# Patient Record
Sex: Female | Born: 1945 | Race: White | Hispanic: No | Marital: Married | Smoking: Never smoker
Health system: Southern US, Community
[De-identification: ages and names within clinical notes are randomized; demographics above are authoritative.]

## PROBLEM LIST (undated history)

## (undated) DIAGNOSIS — E871 Hypo-osmolality and hyponatremia: Secondary | ICD-10-CM

## (undated) DIAGNOSIS — G43719 Chronic migraine without aura, intractable, without status migrainosus: Secondary | ICD-10-CM

## (undated) DIAGNOSIS — J189 Pneumonia, unspecified organism: Secondary | ICD-10-CM

## (undated) DIAGNOSIS — H903 Sensorineural hearing loss, bilateral: Secondary | ICD-10-CM

## (undated) DIAGNOSIS — H8103 Meniere's disease, bilateral: Secondary | ICD-10-CM

## (undated) DIAGNOSIS — R42 Dizziness and giddiness: Secondary | ICD-10-CM

## (undated) DIAGNOSIS — J40 Bronchitis, not specified as acute or chronic: Secondary | ICD-10-CM

## (undated) DIAGNOSIS — M8580 Other specified disorders of bone density and structure, unspecified site: Secondary | ICD-10-CM

## (undated) DIAGNOSIS — G7100 Muscular dystrophy, unspecified: Secondary | ICD-10-CM

## (undated) DIAGNOSIS — K5790 Diverticulosis of intestine, part unspecified, without perforation or abscess without bleeding: Secondary | ICD-10-CM

## (undated) DIAGNOSIS — E46 Unspecified protein-calorie malnutrition: Secondary | ICD-10-CM

## (undated) DIAGNOSIS — C801 Malignant (primary) neoplasm, unspecified: Secondary | ICD-10-CM

## (undated) DIAGNOSIS — G629 Polyneuropathy, unspecified: Secondary | ICD-10-CM

## (undated) HISTORY — DX: Unspecified protein-calorie malnutrition: E46

## (undated) HISTORY — DX: Bronchitis, not specified as acute or chronic: J40

## (undated) HISTORY — DX: Chronic migraine without aura, intractable, without status migrainosus: G43.719

## (undated) HISTORY — DX: Malignant (primary) neoplasm, unspecified: C80.1

## (undated) HISTORY — PX: ABDOMINAL HYSTERECTOMY: SHX81

## (undated) HISTORY — DX: Diverticulosis of intestine, part unspecified, without perforation or abscess without bleeding: K57.90

## (undated) HISTORY — DX: Other specified disorders of bone density and structure, unspecified site: M85.80

## (undated) HISTORY — PX: TONSILLECTOMY: SUR1361

## (undated) HISTORY — DX: Sensorineural hearing loss, bilateral: H90.3

## (undated) HISTORY — DX: Meniere's disease, bilateral: H81.03

## (undated) HISTORY — PX: TUBAL LIGATION: SHX77

## (undated) HISTORY — DX: Pneumonia, unspecified organism: J18.9

## (undated) HISTORY — DX: Dizziness and giddiness: R42

## (undated) HISTORY — PX: OTHER SURGICAL HISTORY: SHX169

## (undated) HISTORY — DX: Polyneuropathy, unspecified: G62.9

## (undated) HISTORY — DX: Hypo-osmolality and hyponatremia: E87.1

---
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
## 1968-03-06 HISTORY — PX: OTHER SURGICAL HISTORY: SHX169

## 1977-03-06 HISTORY — PX: BREAST ENHANCEMENT SURGERY: SHX7

## 1998-10-07 ENCOUNTER — Ambulatory Visit (HOSPITAL_BASED_OUTPATIENT_CLINIC_OR_DEPARTMENT_OTHER): Admission: RE | Admit: 1998-10-07 | Discharge: 1998-10-07 | Payer: Self-pay | Admitting: Plastic Surgery

## 1999-01-04 ENCOUNTER — Encounter: Admission: RE | Admit: 1999-01-04 | Discharge: 1999-01-04 | Payer: Self-pay | Admitting: *Deleted

## 1999-01-04 ENCOUNTER — Encounter: Payer: Self-pay | Admitting: *Deleted

## 2000-01-07 ENCOUNTER — Ambulatory Visit (HOSPITAL_COMMUNITY): Admission: RE | Admit: 2000-01-07 | Discharge: 2000-01-07 | Payer: Self-pay | Admitting: *Deleted

## 2000-01-07 ENCOUNTER — Encounter: Payer: Self-pay | Admitting: *Deleted

## 2000-02-11 ENCOUNTER — Encounter: Payer: Self-pay | Admitting: Neurology

## 2000-02-11 ENCOUNTER — Ambulatory Visit (HOSPITAL_COMMUNITY): Admission: RE | Admit: 2000-02-11 | Discharge: 2000-02-11 | Payer: Self-pay | Admitting: Neurology

## 2000-08-17 ENCOUNTER — Emergency Department (HOSPITAL_COMMUNITY): Admission: EM | Admit: 2000-08-17 | Discharge: 2000-08-17 | Payer: Self-pay | Admitting: Emergency Medicine

## 2000-08-17 ENCOUNTER — Encounter: Payer: Self-pay | Admitting: Emergency Medicine

## 2000-10-23 ENCOUNTER — Encounter: Payer: Self-pay | Admitting: Family Medicine

## 2000-10-23 ENCOUNTER — Ambulatory Visit (HOSPITAL_COMMUNITY): Admission: RE | Admit: 2000-10-23 | Discharge: 2000-10-23 | Payer: Self-pay | Admitting: Family Medicine

## 2000-12-05 ENCOUNTER — Encounter (INDEPENDENT_AMBULATORY_CARE_PROVIDER_SITE_OTHER): Payer: Self-pay | Admitting: Specialist

## 2000-12-05 ENCOUNTER — Ambulatory Visit (HOSPITAL_COMMUNITY): Admission: RE | Admit: 2000-12-05 | Discharge: 2000-12-05 | Payer: Self-pay | Admitting: Gastroenterology

## 2001-01-09 ENCOUNTER — Ambulatory Visit (HOSPITAL_COMMUNITY): Admission: RE | Admit: 2001-01-09 | Discharge: 2001-01-09 | Payer: Self-pay | Admitting: Gastroenterology

## 2002-05-20 ENCOUNTER — Encounter: Payer: Self-pay | Admitting: Internal Medicine

## 2002-05-20 ENCOUNTER — Encounter: Admission: RE | Admit: 2002-05-20 | Discharge: 2002-05-20 | Payer: Self-pay | Admitting: Internal Medicine

## 2002-05-22 ENCOUNTER — Other Ambulatory Visit: Admission: RE | Admit: 2002-05-22 | Discharge: 2002-05-22 | Payer: Self-pay | Admitting: Obstetrics and Gynecology

## 2002-10-28 ENCOUNTER — Ambulatory Visit: Admission: RE | Admit: 2002-10-28 | Discharge: 2002-10-28 | Payer: Self-pay | Admitting: Neurology

## 2002-10-28 ENCOUNTER — Encounter: Payer: Self-pay | Admitting: Neurology

## 2003-01-27 ENCOUNTER — Encounter: Admission: RE | Admit: 2003-01-27 | Discharge: 2003-01-27 | Payer: Self-pay | Admitting: Neurology

## 2003-02-12 ENCOUNTER — Encounter: Admission: RE | Admit: 2003-02-12 | Discharge: 2003-02-12 | Payer: Self-pay | Admitting: Neurology

## 2003-07-22 ENCOUNTER — Encounter: Admission: RE | Admit: 2003-07-22 | Discharge: 2003-07-22 | Payer: Self-pay | Admitting: Internal Medicine

## 2003-07-23 ENCOUNTER — Other Ambulatory Visit: Admission: RE | Admit: 2003-07-23 | Discharge: 2003-07-23 | Payer: Self-pay | Admitting: Obstetrics and Gynecology

## 2004-07-26 ENCOUNTER — Other Ambulatory Visit: Admission: RE | Admit: 2004-07-26 | Discharge: 2004-07-26 | Payer: Self-pay | Admitting: Obstetrics and Gynecology

## 2004-10-05 ENCOUNTER — Ambulatory Visit: Payer: Self-pay | Admitting: Internal Medicine

## 2005-10-12 ENCOUNTER — Ambulatory Visit: Payer: Self-pay | Admitting: Internal Medicine

## 2005-10-13 ENCOUNTER — Encounter: Admission: RE | Admit: 2005-10-13 | Discharge: 2005-10-13 | Payer: Self-pay | Admitting: Internal Medicine

## 2006-01-29 ENCOUNTER — Ambulatory Visit: Payer: Self-pay | Admitting: Internal Medicine

## 2006-01-29 LAB — CONVERTED CEMR LAB
ALT: 21 units/L (ref 0–40)
AST: 27 units/L (ref 0–37)
Chol/HDL Ratio, serum: 2.4
Cholesterol: 253 mg/dL (ref 0–200)
Free T4: 0.8 ng/dL — ABNORMAL LOW (ref 0.9–1.8)
HDL: 104.3 mg/dL (ref >39.0)
LDL DIRECT: 123.4 mg/dL
T3, Free: 3.1 pg/mL (ref 2.3–4.2)
TSH: 2.65 microintl units/mL (ref 0.35–5.50)
Triglyceride fasting, serum: 46 mg/dL (ref 0–149)
VLDL: 9 mg/dL (ref 0–40)

## 2006-01-30 ENCOUNTER — Encounter: Admission: RE | Admit: 2006-01-30 | Discharge: 2006-01-30 | Payer: Self-pay | Admitting: Internal Medicine

## 2006-10-16 ENCOUNTER — Encounter: Payer: Self-pay | Admitting: Internal Medicine

## 2006-10-23 ENCOUNTER — Ambulatory Visit: Payer: Self-pay | Admitting: Internal Medicine

## 2006-10-23 ENCOUNTER — Encounter (INDEPENDENT_AMBULATORY_CARE_PROVIDER_SITE_OTHER): Payer: Self-pay | Admitting: *Deleted

## 2006-10-23 DIAGNOSIS — IMO0001 Reserved for inherently not codable concepts without codable children: Secondary | ICD-10-CM | POA: Insufficient documentation

## 2006-10-23 DIAGNOSIS — E042 Nontoxic multinodular goiter: Secondary | ICD-10-CM | POA: Insufficient documentation

## 2006-10-23 DIAGNOSIS — E785 Hyperlipidemia, unspecified: Secondary | ICD-10-CM | POA: Insufficient documentation

## 2006-10-23 LAB — CONVERTED CEMR LAB
Bilirubin Urine: NEGATIVE
Blood in Urine, dipstick: NEGATIVE
Glucose, Urine, Semiquant: NEGATIVE
Ketones, urine, test strip: NEGATIVE
Nitrite: NEGATIVE
Protein, U semiquant: NEGATIVE
Specific Gravity, Urine: <1.005
Urobilinogen, UA: NEGATIVE
WBC Urine, dipstick: NEGATIVE
pH: 6.5

## 2006-10-24 ENCOUNTER — Encounter (INDEPENDENT_AMBULATORY_CARE_PROVIDER_SITE_OTHER): Payer: Self-pay | Admitting: *Deleted

## 2006-10-25 ENCOUNTER — Encounter: Admission: RE | Admit: 2006-10-25 | Discharge: 2006-10-25 | Payer: Self-pay | Admitting: Internal Medicine

## 2006-10-26 ENCOUNTER — Telehealth (INDEPENDENT_AMBULATORY_CARE_PROVIDER_SITE_OTHER): Payer: Self-pay | Admitting: *Deleted

## 2006-10-29 ENCOUNTER — Ambulatory Visit: Payer: Self-pay | Admitting: Internal Medicine

## 2006-10-29 ENCOUNTER — Encounter (INDEPENDENT_AMBULATORY_CARE_PROVIDER_SITE_OTHER): Payer: Self-pay | Admitting: *Deleted

## 2006-11-01 ENCOUNTER — Encounter (INDEPENDENT_AMBULATORY_CARE_PROVIDER_SITE_OTHER): Payer: Self-pay | Admitting: *Deleted

## 2007-03-05 ENCOUNTER — Ambulatory Visit (HOSPITAL_COMMUNITY): Admission: RE | Admit: 2007-03-05 | Discharge: 2007-03-06 | Payer: Self-pay | Admitting: Obstetrics and Gynecology

## 2007-03-07 DIAGNOSIS — C801 Malignant (primary) neoplasm, unspecified: Secondary | ICD-10-CM

## 2007-03-07 HISTORY — DX: Malignant (primary) neoplasm, unspecified: C80.1

## 2007-06-11 ENCOUNTER — Telehealth (INDEPENDENT_AMBULATORY_CARE_PROVIDER_SITE_OTHER): Payer: Self-pay | Admitting: *Deleted

## 2007-06-12 ENCOUNTER — Telehealth (INDEPENDENT_AMBULATORY_CARE_PROVIDER_SITE_OTHER): Payer: Self-pay | Admitting: *Deleted

## 2007-06-17 ENCOUNTER — Encounter: Payer: Self-pay | Admitting: Internal Medicine

## 2007-06-24 ENCOUNTER — Encounter: Payer: Self-pay | Admitting: Internal Medicine

## 2007-07-01 ENCOUNTER — Telehealth (INDEPENDENT_AMBULATORY_CARE_PROVIDER_SITE_OTHER): Payer: Self-pay | Admitting: *Deleted

## 2007-07-02 ENCOUNTER — Ambulatory Visit: Payer: Self-pay | Admitting: Internal Medicine

## 2007-11-07 ENCOUNTER — Telehealth (INDEPENDENT_AMBULATORY_CARE_PROVIDER_SITE_OTHER): Payer: Self-pay | Admitting: *Deleted

## 2008-01-27 ENCOUNTER — Ambulatory Visit: Payer: Self-pay | Admitting: Internal Medicine

## 2008-01-27 DIAGNOSIS — Z85828 Personal history of other malignant neoplasm of skin: Secondary | ICD-10-CM | POA: Insufficient documentation

## 2008-01-27 DIAGNOSIS — G7109 Other specified muscular dystrophies: Secondary | ICD-10-CM | POA: Insufficient documentation

## 2008-01-27 DIAGNOSIS — M949 Disorder of cartilage, unspecified: Secondary | ICD-10-CM

## 2008-01-27 DIAGNOSIS — M899 Disorder of bone, unspecified: Secondary | ICD-10-CM | POA: Insufficient documentation

## 2008-01-29 DIAGNOSIS — G609 Hereditary and idiopathic neuropathy, unspecified: Secondary | ICD-10-CM | POA: Insufficient documentation

## 2008-01-29 DIAGNOSIS — R519 Headache, unspecified: Secondary | ICD-10-CM | POA: Insufficient documentation

## 2008-01-29 DIAGNOSIS — R51 Headache: Secondary | ICD-10-CM | POA: Insufficient documentation

## 2008-01-31 ENCOUNTER — Encounter (INDEPENDENT_AMBULATORY_CARE_PROVIDER_SITE_OTHER): Payer: Self-pay | Admitting: *Deleted

## 2008-02-03 ENCOUNTER — Encounter (INDEPENDENT_AMBULATORY_CARE_PROVIDER_SITE_OTHER): Payer: Self-pay | Admitting: *Deleted

## 2008-02-03 ENCOUNTER — Ambulatory Visit: Payer: Self-pay | Admitting: Internal Medicine

## 2008-02-03 LAB — CONVERTED CEMR LAB
OCCULT 1: NEGATIVE
OCCULT 2: NEGATIVE
OCCULT 3: NEGATIVE

## 2008-02-26 ENCOUNTER — Telehealth (INDEPENDENT_AMBULATORY_CARE_PROVIDER_SITE_OTHER): Payer: Self-pay | Admitting: *Deleted

## 2008-03-06 DIAGNOSIS — K5790 Diverticulosis of intestine, part unspecified, without perforation or abscess without bleeding: Secondary | ICD-10-CM

## 2008-03-06 HISTORY — DX: Diverticulosis of intestine, part unspecified, without perforation or abscess without bleeding: K57.90

## 2008-04-02 ENCOUNTER — Telehealth (INDEPENDENT_AMBULATORY_CARE_PROVIDER_SITE_OTHER): Payer: Self-pay | Admitting: *Deleted

## 2008-04-29 ENCOUNTER — Encounter: Payer: Self-pay | Admitting: Internal Medicine

## 2008-07-07 ENCOUNTER — Encounter: Payer: Self-pay | Admitting: Internal Medicine

## 2008-10-27 ENCOUNTER — Encounter: Payer: Self-pay | Admitting: Internal Medicine

## 2008-10-28 ENCOUNTER — Encounter: Payer: Self-pay | Admitting: Internal Medicine

## 2008-11-25 ENCOUNTER — Encounter: Payer: Self-pay | Admitting: Internal Medicine

## 2008-12-30 ENCOUNTER — Telehealth (INDEPENDENT_AMBULATORY_CARE_PROVIDER_SITE_OTHER): Payer: Self-pay | Admitting: *Deleted

## 2009-01-25 ENCOUNTER — Encounter: Payer: Self-pay | Admitting: Internal Medicine

## 2009-02-23 ENCOUNTER — Ambulatory Visit: Payer: Self-pay | Admitting: Internal Medicine

## 2009-02-23 DIAGNOSIS — R002 Palpitations: Secondary | ICD-10-CM | POA: Insufficient documentation

## 2009-02-24 ENCOUNTER — Encounter: Payer: Self-pay | Admitting: Internal Medicine

## 2009-02-24 ENCOUNTER — Telehealth (INDEPENDENT_AMBULATORY_CARE_PROVIDER_SITE_OTHER): Payer: Self-pay | Admitting: *Deleted

## 2009-03-01 ENCOUNTER — Encounter (INDEPENDENT_AMBULATORY_CARE_PROVIDER_SITE_OTHER): Payer: Self-pay | Admitting: *Deleted

## 2009-03-04 ENCOUNTER — Telehealth: Payer: Self-pay | Admitting: Internal Medicine

## 2009-03-25 ENCOUNTER — Encounter: Payer: Self-pay | Admitting: Cardiology

## 2009-03-25 ENCOUNTER — Encounter: Payer: Self-pay | Admitting: Internal Medicine

## 2009-05-12 ENCOUNTER — Encounter: Payer: Self-pay | Admitting: Internal Medicine

## 2009-09-20 ENCOUNTER — Encounter: Payer: Self-pay | Admitting: Internal Medicine

## 2009-10-29 ENCOUNTER — Encounter: Payer: Self-pay | Admitting: Internal Medicine

## 2009-11-04 ENCOUNTER — Encounter: Payer: Self-pay | Admitting: Internal Medicine

## 2009-11-11 ENCOUNTER — Ambulatory Visit: Payer: Self-pay | Admitting: Internal Medicine

## 2009-11-11 DIAGNOSIS — R1084 Generalized abdominal pain: Secondary | ICD-10-CM | POA: Insufficient documentation

## 2009-11-11 DIAGNOSIS — K5900 Constipation, unspecified: Secondary | ICD-10-CM | POA: Insufficient documentation

## 2009-11-11 DIAGNOSIS — R209 Unspecified disturbances of skin sensation: Secondary | ICD-10-CM | POA: Insufficient documentation

## 2009-11-12 LAB — CONVERTED CEMR LAB
ALT: 19 units/L (ref 0–35)
AST: 35 units/L (ref 0–37)
Albumin: 4.6 g/dL (ref 3.5–5.2)
Alkaline Phosphatase: 60 units/L (ref 39–117)
BUN: 8 mg/dL (ref 6–23)
Basophils Absolute: 0 10*3/uL (ref 0.0–0.1)
Basophils Relative: 0.4 % (ref 0.0–3.0)
Bilirubin, Direct: 0.2 mg/dL (ref 0.0–0.3)
CO2: 29 meq/L (ref 19–32)
Calcium: 9.8 mg/dL (ref 8.4–10.5)
Chloride: 99 meq/L (ref 96–112)
Creatinine, Ser: 0.4 mg/dL (ref 0.4–1.2)
Eosinophils Absolute: 0.1 10*3/uL (ref 0.0–0.7)
Eosinophils Relative: 2.5 % (ref 0.0–5.0)
GFR calc non Af Amer: 152.67 mL/min (ref >60)
Glucose, Bld: 110 mg/dL — ABNORMAL HIGH (ref 70–99)
HCT: 41.3 % (ref 36.0–46.0)
Hemoglobin: 14 g/dL (ref 12.0–15.0)
Lymphocytes Relative: 21.8 % (ref 12.0–46.0)
Lymphs Abs: 1 10*3/uL (ref 0.7–4.0)
MCHC: 33.8 g/dL (ref 30.0–36.0)
MCV: 93.3 fL (ref 78.0–100.0)
Monocytes Absolute: 0.5 10*3/uL (ref 0.1–1.0)
Monocytes Relative: 10.7 % (ref 3.0–12.0)
Neutro Abs: 2.9 10*3/uL (ref 1.4–7.7)
Neutrophils Relative %: 64.6 % (ref 43.0–77.0)
Platelets: 379 10*3/uL (ref 150.0–400.0)
Potassium: 5 meq/L (ref 3.5–5.1)
RBC: 4.43 M/uL (ref 3.87–5.11)
RDW: 13.7 % (ref 11.5–14.6)
Sodium: 138 meq/L (ref 135–145)
TSH: 2.75 microintl units/mL (ref 0.35–5.50)
Total Bilirubin: 0.5 mg/dL (ref 0.3–1.2)
Total Protein: 7.2 g/dL (ref 6.0–8.3)
Vitamin B-12: 798 pg/mL (ref 211–911)
WBC: 4.4 10*3/uL — ABNORMAL LOW (ref 4.5–10.5)

## 2009-11-15 ENCOUNTER — Encounter: Admission: RE | Admit: 2009-11-15 | Discharge: 2009-11-15 | Payer: Self-pay | Admitting: Internal Medicine

## 2009-11-15 LAB — CONVERTED CEMR LAB: Hgb A1c MFr Bld: 5.8 % (ref 4.6–6.5)

## 2009-11-16 ENCOUNTER — Encounter: Payer: Self-pay | Admitting: Internal Medicine

## 2009-11-19 ENCOUNTER — Encounter: Payer: Self-pay | Admitting: Internal Medicine

## 2009-11-19 LAB — HM COLONOSCOPY

## 2010-02-04 ENCOUNTER — Encounter: Payer: Self-pay | Admitting: Internal Medicine

## 2010-02-09 ENCOUNTER — Telehealth: Payer: Self-pay | Admitting: Internal Medicine

## 2010-02-10 ENCOUNTER — Telehealth: Payer: Self-pay | Admitting: Internal Medicine

## 2010-02-16 ENCOUNTER — Telehealth (INDEPENDENT_AMBULATORY_CARE_PROVIDER_SITE_OTHER): Payer: Self-pay | Admitting: *Deleted

## 2010-03-03 ENCOUNTER — Encounter
Admission: RE | Admit: 2010-03-03 | Discharge: 2010-03-03 | Payer: Self-pay | Source: Home / Self Care | Attending: Neurology | Admitting: Neurology

## 2010-03-26 ENCOUNTER — Encounter: Payer: Self-pay | Admitting: Neurology

## 2010-04-03 LAB — CONVERTED CEMR LAB
ALT: 17 units/L (ref 0–35)
ALT: 18 units/L (ref 0–35)
ALT: 20 units/L (ref 0–35)
AST: 27 units/L (ref 0–37)
AST: 28 units/L (ref 0–37)
AST: 30 units/L (ref 0–37)
Albumin: 4.4 g/dL (ref 3.5–5.2)
Albumin: 4.5 g/dL (ref 3.5–5.2)
Albumin: 4.6 g/dL (ref 3.5–5.2)
Alkaline Phosphatase: 51 units/L (ref 39–117)
Alkaline Phosphatase: 56 units/L (ref 39–117)
Alkaline Phosphatase: 57 units/L (ref 39–117)
BUN: 10 mg/dL (ref 6–23)
BUN: 11 mg/dL (ref 6–23)
BUN: 6 mg/dL (ref 6–23)
Basophils Absolute: 0 10*3/uL (ref 0.0–0.1)
Basophils Absolute: 0 10*3/uL (ref 0.0–0.1)
Basophils Absolute: 0 10*3/uL (ref 0.0–0.1)
Basophils Relative: 0.3 % (ref 0.0–1.0)
Basophils Relative: 0.9 % (ref 0.0–3.0)
Basophils Relative: 0.9 % (ref 0.0–3.0)
Bilirubin, Direct: 0 mg/dL (ref 0.0–0.3)
Bilirubin, Direct: 0.1 mg/dL (ref 0.0–0.3)
Bilirubin, Direct: 0.1 mg/dL (ref 0.0–0.3)
CO2: 28 meq/L (ref 19–32)
CO2: 31 meq/L (ref 19–32)
CO2: 32 meq/L (ref 19–32)
Calcium: 10.1 mg/dL (ref 8.4–10.5)
Calcium: 9.4 mg/dL (ref 8.4–10.5)
Calcium: 9.5 mg/dL (ref 8.4–10.5)
Chloride: 100 meq/L (ref 96–112)
Chloride: 102 meq/L (ref 96–112)
Chloride: 103 meq/L (ref 96–112)
Cholesterol, target level: 200 mg/dL
Cholesterol: 219 mg/dL (ref 0–200)
Cholesterol: 231 mg/dL — ABNORMAL HIGH (ref 0–200)
Cholesterol: 232 mg/dL (ref 0–200)
Creatinine, Ser: 0.5 mg/dL (ref 0.4–1.2)
Creatinine, Ser: 0.6 mg/dL (ref 0.4–1.2)
Creatinine, Ser: 0.6 mg/dL (ref 0.4–1.2)
Direct LDL: 117.8 mg/dL
Direct LDL: 72.1 mg/dL
Direct LDL: 96.5 mg/dL
Eosinophils Absolute: 0.1 10*3/uL (ref 0.0–0.6)
Eosinophils Absolute: 0.1 10*3/uL (ref 0.0–0.7)
Eosinophils Absolute: 0.1 10*3/uL (ref 0.0–0.7)
Eosinophils Relative: 2.5 % (ref 0.0–5.0)
Eosinophils Relative: 2.6 % (ref 0.0–5.0)
Eosinophils Relative: 2.7 % (ref 0.0–5.0)
Free T4: 0.7 ng/dL (ref 0.6–1.6)
Free T4: 0.8 ng/dL (ref 0.6–1.6)
GFR calc Af Amer: 130 mL/min
GFR calc Af Amer: 161 mL/min
GFR calc non Af Amer: 106.98 mL/min (ref >60)
GFR calc non Af Amer: 108 mL/min
GFR calc non Af Amer: 133 mL/min
Glucose, Bld: 101 mg/dL — ABNORMAL HIGH (ref 70–99)
Glucose, Bld: 71 mg/dL (ref 70–99)
Glucose, Bld: 99 mg/dL (ref 70–99)
HCT: 41.8 % (ref 36.0–46.0)
HCT: 42.5 % (ref 36.0–46.0)
HCT: 43.4 % (ref 36.0–46.0)
HDL goal, serum: 40 mg/dL
HDL: 109.8 mg/dL (ref >39.00)
HDL: 128 mg/dL (ref >39.0)
HDL: 96.1 mg/dL (ref >39.0)
Hemoglobin: 14 g/dL (ref 12.0–15.0)
Hemoglobin: 14.4 g/dL (ref 12.0–15.0)
Hemoglobin: 14.6 g/dL (ref 12.0–15.0)
LDL Goal: 160 mg/dL
Lymphocytes Relative: 29.9 % (ref 12.0–46.0)
Lymphocytes Relative: 30.7 % (ref 12.0–46.0)
Lymphocytes Relative: 34 % (ref 12.0–46.0)
Lymphs Abs: 1.2 10*3/uL (ref 0.7–4.0)
MCHC: 32.8 g/dL (ref 30.0–36.0)
MCHC: 33.7 g/dL (ref 30.0–36.0)
MCHC: 34.5 g/dL (ref 30.0–36.0)
MCV: 92.5 fL (ref 78.0–100.0)
MCV: 93.2 fL (ref 78.0–100.0)
MCV: 96.8 fL (ref 78.0–100.0)
Monocytes Absolute: 0.3 10*3/uL (ref 0.1–1.0)
Monocytes Absolute: 0.3 10*3/uL (ref 0.2–0.7)
Monocytes Absolute: 0.4 10*3/uL (ref 0.1–1.0)
Monocytes Relative: 6.7 % (ref 3.0–11.0)
Monocytes Relative: 7.6 % (ref 3.0–12.0)
Monocytes Relative: 9.2 % (ref 3.0–12.0)
Neutro Abs: 2.1 10*3/uL (ref 1.4–7.7)
Neutro Abs: 2.4 10*3/uL (ref 1.4–7.7)
Neutro Abs: 2.5 10*3/uL (ref 1.4–7.7)
Neutrophils Relative %: 53.4 % (ref 43.0–77.0)
Neutrophils Relative %: 58.9 % (ref 43.0–77.0)
Neutrophils Relative %: 59.7 % (ref 43.0–77.0)
Platelets: 280 10*3/uL (ref 150–400)
Platelets: 303 10*3/uL (ref 150–400)
Platelets: 318 10*3/uL (ref 150.0–400.0)
Potassium: 3.6 meq/L (ref 3.5–5.1)
Potassium: 4 meq/L (ref 3.5–5.1)
Potassium: 5.3 meq/L — ABNORMAL HIGH (ref 3.5–5.1)
RBC: 4.39 M/uL (ref 3.87–5.11)
RBC: 4.49 M/uL (ref 3.87–5.11)
RBC: 4.69 M/uL (ref 3.87–5.11)
RDW: 12.6 % (ref 11.5–14.6)
RDW: 12.9 % (ref 11.5–14.6)
RDW: 13.5 % (ref 11.5–14.6)
Sodium: 138 meq/L (ref 135–145)
Sodium: 141 meq/L (ref 135–145)
Sodium: 142 meq/L (ref 135–145)
T3, Free: 3.2 pg/mL (ref 2.3–4.2)
TSH: 2.47 microintl units/mL (ref 0.35–5.50)
TSH: 2.69 microintl units/mL (ref 0.35–5.50)
TSH: 4.15 microintl units/mL (ref 0.35–5.50)
Total Bilirubin: 0.9 mg/dL (ref 0.3–1.2)
Total Bilirubin: 0.9 mg/dL (ref 0.3–1.2)
Total Bilirubin: 1 mg/dL (ref 0.3–1.2)
Total CHOL/HDL Ratio: 1.7
Total CHOL/HDL Ratio: 2
Total CHOL/HDL Ratio: 2.4
Total Protein: 7.7 g/dL (ref 6.0–8.3)
Total Protein: 7.8 g/dL (ref 6.0–8.3)
Total Protein: 7.8 g/dL (ref 6.0–8.3)
Triglycerides: 51 mg/dL (ref 0–149)
Triglycerides: 52 mg/dL (ref 0–149)
Triglycerides: 58 mg/dL (ref 0.0–149.0)
VLDL: 10 mg/dL (ref 0–40)
VLDL: 10 mg/dL (ref 0–40)
VLDL: 11.6 mg/dL (ref 0.0–40.0)
Vit D, 1,25-Dihydroxy: 26 — ABNORMAL LOW (ref 30–89)
Vit D, 25-Hydroxy: 63 ng/mL (ref 30–89)
WBC: 3.9 10*3/uL — ABNORMAL LOW (ref 4.5–10.5)
WBC: 4 10*3/uL — ABNORMAL LOW (ref 4.5–10.5)
WBC: 4.1 10*3/uL — ABNORMAL LOW (ref 4.5–10.5)

## 2010-04-05 NOTE — Consult Note (Signed)
Summary: [facility]  [facility]   Imported By: Lanelle Bal 09/30/2009 12:35:44  _____________________________________________________________________  External Attachment:    Type:   Image     Comment:   External Document

## 2010-04-05 NOTE — Letter (Signed)
[facility]  [facility]   Imported By: Lanelle Bal 11/22/2009 11:53:04  _____________________________________________________________________  External Attachment:    Type:   Image     Comment:   External Document

## 2010-04-05 NOTE — Letter (Signed)
Summary: [facility]   Imported By: Lanelle Bal 05/20/2009 11:06:56  _____________________________________________________________________  External Attachment:    Type:   Image     Comment:   External Document

## 2010-04-05 NOTE — Consult Note (Signed)
Summary: [facility]  [facility]   Imported By: Lanelle Bal 12/15/2009 14:04:59  _____________________________________________________________________  External Attachment:    Type:   Image     Comment:   External Document

## 2010-04-05 NOTE — Progress Notes (Signed)
Summary: Miacalcin refill--needs generic, not brand name  Phone Note Refill Request Message from:  Patient on February 10, 2010 10:57 AM  Refills Requested: Medication #1:  MIACALCIN 200 UNIT/ACT NASAL SOLN qhs patient just called back---wants [facility] to be generic, not brand name due to cost  Initial call taken by: Jerolyn Shin,  February 10, 2010 10:58 AM  Follow-up for Phone Call        This was not marked BMN so they will fill generic.  Patient aware. Follow-up by: Lucious Groves CMA,  February 10, 2010 11:17 AM

## 2010-04-05 NOTE — Procedures (Signed)
Summary: Colonoscopy/[facility]  Colonoscopy/[facility]   Imported By: Lanelle Bal 12/06/2009 09:08:21  _____________________________________________________________________  External Attachment:    Type:   Image     Comment:   External Document

## 2010-04-05 NOTE — Assessment & Plan Note (Signed)
Summary: "MAJOR BOWEL ISSUES"--[facility] [place]   Vital Signs:  Patient profile:   65 year old female Weight:      117.4 pounds Temp:     98.9 degrees F oral Pulse rate:   76 / minute Resp:     16 per minute BP sitting:   150 / 86  (left arm) Cuff size:   regular  Vitals Entered By: Shonna Chock CMA (November 11, 2009 10:45 AM) CC: Bowel Issues x 2 weeks, Last BM: Sunday, in order for patient to have a BM she has to Max out on OTC laxitives and stool softners. Patient was seen in the hospital on 10/29/09 , Constipation   CC:  Bowel Issues x 2 weeks, Last BM: Sunday, in order for patient to have a BM she has to Max out on OTC laxitives and stool softners. Patient was seen in the hospital on 10/29/09 , and Constipation.  History of Present Illness:      This is a 65 year old female who presents with Constipation since 08/24 despite  initiation of maximum doses of  stool softeners & laxatives every day since 09/01.  The patient reports small  hard stools ("rabbit pellets"),pressure like  abdominal pain,mild hemorrhoidal flare, and intermittent nausea.She  denies rectal pain, persistant  cramping, hematochezia, melena, vomiting, intermittent loose stools, rectal seepage, and tenesmus.  Associated symptoms include cold intolerance, this is chronic.  The patient denies the following symptoms: fever, back pain,significant  pelvic pain, and urinary retention.  The patient's medical history is notable for irritable bowel syndrome & MD( folowed by Dr Sandria Manly).  Partially effective treatments have included stool softeners and OTC laxatives.  Last colonoscopy 2002 was negative by Dr Madilyn Fireman.Records from [facility] & ER reviewed.  Current Medications (verified): 1)  Robaxin-750 750 Mg Tabs (Methocarbamol) .Marland Kitchen.. 1 By Mouth 3-4 X Daily 2)  Mucinex 400 Mg  Tb12 (Guaifenesin) .... As Needed 3)  Nortriptyline Hcl 10 Mg  Caps (Nortriptyline Hcl) .... Take Two Tabs Qd 4)  Miacalcin 200 Unit/act Nasal  Soln (Calcitonin (Salmon)) .... Qhs 5)  Fioricet 50-325-40 Mg  Tabs (Butalbital-Apap-Caffeine) .Marland Kitchen.. 1 By Mouth As Needed Pain 6)  Maxalt-Mlt 10 Mg  Tbdp (Rizatriptan Benzoate) .... As Needed Migraine 7)  Fexofenadine Hcl 180 Mg  Tabs (Fexofenadine Hcl) .... Take One Tablet Daily As Needed 8)  Neurontin 100 Mg Caps (Gabapentin) .... 3 By Mouth At Bedtime 9)  Pepcid Ac 10 Mg Tabs (Famotidine) .... As Needed  Allergies: 1)  ! * Actonel 2)  Cipro 3)  Sulfa 4)  Vioxx 5)  Fosamax 6)  * Aerobid  Review of Systems General:  Denies chills and sweats. Eyes:  Denies blurring, double vision, and vision loss-both eyes. ENT:  Denies difficulty swallowing and hoarseness. CV:  Occasional palpitations ; Dr Patty Sermons has assessed this. Derm:  Complains of hair loss; denies changes in nail beds and dryness. Neuro:  Complains of numbness and tingling; N&T from knees to feet & elbows to fingertips since late Spring. Some N&T in thighs of recent onset.  Physical Exam  General:  Thin but in no acute distress; alert,appropriate and cooperative throughout examination Eyes:  No corneal or conjunctival inflammation noted. EOMI. Perrla. No lid lag Neck:  No deformities, masses, or tenderness noted. Thyroid small Lungs:  Normal respiratory effort, chest expands symmetrically. Lungs are clear to auscultation, no crackles or wheezes. Heart:  normal rate, regular rhythm, no gallop, no rub, no JVD, no HJR, and  grade 1 /6 systolic murmur.   Abdomen:  Bowel sounds positive,abdomen soft but tender without masses, organomegaly or hernias noted.Diffuse bruits . Aorta not clinically enlarged Extremities:  No clubbing, cyanosis, edema.No onycholysis Neurologic:  alert & oriented X3, strength normal in all extremities, and DTRs symmetrical and normal.  No tremor   Impression & Recommendations:  Problem # 1:  CONSTIPATION (ICD-564.00)  Orders: Venipuncture (16109) TLB-TSH (Thyroid Stimulating Hormone)  (84443-TSH) Gastroenterology Referral (GI)  Problem # 2:  ABDOMINAL PAIN, GENERALIZED (ICD-789.07)  in context of #1  Orders: Venipuncture (60454) TLB-CBC Platelet - w/Differential (85025-CBCD) TLB-Hepatic/Liver Function Pnl (80076-HEPATIC) Gastroenterology Referral (GI) Radiology Referral (Radiology)  Problem # 3:  PARESTHESIA (ICD-782.0)  progressive of U & L extremities distally, recently in thighs  Orders: Venipuncture (09811) TLB-BMP (Basic Metabolic Panel-BMET) (80048-METABOL) TLB-TSH (Thyroid Stimulating Hormone) (84443-TSH) TLB-B12, Serum-Total ONLY (91478-G95) T-RPR (Syphilis) (62130-86578) Neurology Referral (Neuro)  Complete Medication List: 1)  Robaxin-750 750 Mg Tabs (Methocarbamol) .Marland Kitchen.. 1 by mouth 3-4 x daily 2)  Mucinex 400 Mg Tb12 (guaifenesin)  .... As needed 3)  Nortriptyline Hcl 10 Mg Caps (Nortriptyline hcl) .... Take two tabs qd 4)  Miacalcin 200 Unit/act Nasal Soln (Calcitonin (salmon)) .... Qhs 5)  Fioricet 50-325-40 Mg Tabs (Butalbital-apap-caffeine) .Marland Kitchen.. 1 by mouth as needed pain 6)  Maxalt-mlt 10 Mg Tbdp (Rizatriptan benzoate) .... As needed migraine 7)  Fexofenadine Hcl 180 Mg Tabs (Fexofenadine hcl) .... Take one tablet daily as needed 8)  Neurontin 100 Mg Caps (Gabapentin) .... 3 by mouth at bedtime 9)  Pepcid Ac 10 Mg Tabs (Famotidine) .... As needed 10)  Amlodipine Besylate 2.5 Mg Tabs (Amlodipine besylate) .Marland Kitchen.. 1 once daily if bp averages > 135/85  Patient Instructions: 1)  Miralax once daily X 3 days. 2)  Drink as much fluid as you can tolerate for the next few days. 3)  Check your Blood Pressure regularly. If it is above:  135/85 ON AVERAGE you should start Amlodipine 2.5 mg once daily . Prescriptions: AMLODIPINE BESYLATE 2.5 MG TABS (AMLODIPINE BESYLATE) 1 once daily if BP averages > 135/85  #30 x 5   Entered and Authorized by:   Marga Melnick MD   Signed by:   Marga Melnick MD on 11/11/2009   Method used:   Print then Give to  Patient   RxID:   [id number]

## 2010-04-05 NOTE — Progress Notes (Signed)
Summary: Med refill--Nasal Spray  Phone Note Refill Request   Refills Requested: Medication #1:  MIACALCIN 200 UNIT/ACT NASAL SOLN qhs Patient left message on triage asking if she is to continue this med and if she is please [facility] Please advise.  Initial call taken by: Lucious Groves CMA,  February 09, 2010 3:02 PM  Follow-up for Phone Call        this should be continued for max of 5 years; refill if she has taken it less than 5 years total Follow-up by: Marga Melnick MD,  February 10, 2010 5:52 AM  Additional Follow-up for Phone Call Additional follow up Details #1::        left message with spouse to call back to office. Lucious Groves CMA  February 10, 2010 8:39 AM   Patient notified. Lucious Groves CMA  February 11, 2010 10:21 AM     Prescriptions: MIACALCIN 200 UNIT/ACT NASAL SOLN (CALCITONIN (SALMON)) qhs  #3 mos x 3   Entered by:   Lucious Groves CMA   Authorized by:   Marga Melnick MD   Signed by:   Lucious Groves CMA on 02/10/2010   Method used:   Faxed to ...       [facility]             , [place]         Ph: [phone]       Fax: [phone]   RxID:   [id number]

## 2010-04-05 NOTE — Progress Notes (Signed)
Summary: stomach flu  Phone Note Refill Request Call back at Home Phone [phone]   Refills Requested: Medication #1:  phenergan suppository [place] her sister had the stomach flu. pt states that upon returning home she is now exhibiting the same symptoms: nausea, vomiting, and unable to keep anything down. pt states that she had a old rx for the suppository from 2006 that she took and it did work and she did feel better and was able to eat but then early this morning the symptoms began again. pt states that she is too sick to come in to the office and would like for you to rx  this med in for her........................Marland KitchenFelecia Deloach CMA  December 30, 2008 12:34 PM    Follow-up for Phone Call        call pt back offer pt appointment  to come in today pt states that there is no way that she can get in to see dr hopper today because she constantly needs to be near a toilet due to the vomiting. pt states that she knows what is going on with her body and she knows what she needs and she need this med to break the cycle so that she can get better......Marland KitchenFelecia Deloach CMA  December 30, 2008 12:55 PM   [facility].................Marland KitchenFelecia Deloach CMA  December 30, 2008 12:43 PM   Additional Follow-up for Phone Call Additional follow up Details #1::        phenergan suppository 25 mg 1 q 6-8 hrs as needed #3; clear liquids until better. Report pain ,fever, rectal bleeding.OVINB  Additional Follow-up by: Marga Melnick MD,  December 30, 2008 2:14 PM    Additional Follow-up for Phone Call Additional follow up Details #2::    pt aware, rx sent to pharmacy.......................Marland KitchenFelecia Deloach CMA  December 30, 2008 2:44 PM   New/Updated Medications: PROMETHAZINE HCL 25 MG SUPP (PROMETHAZINE HCL) 1 q 6-8 hrs as needed Prescriptions: PROMETHAZINE HCL 25 MG SUPP (PROMETHAZINE HCL) 1 q 6-8 hrs as needed  #3 x 0   Entered by:   Jeremy Johann CMA   Authorized by:   Marga Melnick MD   Signed by:   Jeremy Johann CMA on 12/30/2008   Method used:   Faxed to ...       [facility] (retail)       [address]       [place], [place]  [address]       Ph: [phone] or [phone]       Fax: [phone]   RxID:   [id number]

## 2010-04-07 NOTE — Consult Note (Signed)
Summary: [facility]  [facility]   Imported By: Lanelle Bal 02/14/2010 11:13:00  _____________________________________________________________________  External Attachment:    Type:   Image     Comment:   External Document

## 2010-04-07 NOTE — Progress Notes (Signed)
Summary: clarificartion about Miacalcin refill  Phone Note Refill Request Message from:  Fax from Pharmacy on February 16, 2010 10:44 AM  Refills Requested: Medication #1:  MIACALCIN 200 UNIT/ACT NASAL SOLN qhs Clarification Fax from [facility] states:  ****please clarify directions for Miacalcin nasal spray.   Should they read:  Use 1 spray in 1 nostril at bedtime (alternate nostrils)? "            Fax = [phone], did not leave phone number    Initial call taken by: Jerolyn Shin,  February 16, 2010 10:49 AM  Follow-up for Phone Call        RX faxed to [phone],  Follow-up by: Shonna Chock CMA,  February 16, 2010 1:34 PM    New/Updated Medications: MIACALCIN 200 UNIT/ACT NASAL SOLN (CALCITONIN (SALMON)) 1 spray in alternating nostrils daily Prescriptions: MIACALCIN 200 UNIT/ACT NASAL SOLN (CALCITONIN (SALMON)) 1 spray in alternating nostrils daily  #39mo supply x 3   Entered by:   Shonna Chock CMA   Authorized by:   Marga Melnick MD   Signed by:   Shonna Chock CMA on 02/16/2010   Method used:   Print then Give to Patient   RxID:   [id number]

## 2010-07-19 NOTE — H&P (Signed)
NAMEHAFSA, LOHN             ACCOUNT NO.:  [id number]   MEDICAL RECORD NO.:  [id number]          PATIENT TYPE:  AMB   LOCATION:  SDC                           F[facility]ILITY:  [facility]   PHYSICIAN:  Osborn Coho, M.D.   DATE OF BIRTH:  1945-12-28   DATE OF ADMISSION:  03/05/2007  DATE OF DISCHARGE:                              HISTORY & PHYSICAL   HISTORY OF PRESENT ILLNESS:  Ms. Lynam is a 65 year old married white  female, para 2-0-0-2, status post hysterectomy, presenting for placement  of tension-free vaginal tape and cystoscopy because of stress urinary  incontinence.  The patient reports that for the past several years she  has had increasing leakage of urine with cough and desires surgical  management for the same.  She denies any vaginitis symptoms, pelvic  pain, changes in her bowel habits, or fever.   PAST MEDICAL HISTORY:   OB HISTORY:  Gravida 2, para 2-0-0-2. The patient had two spontaneous  vaginal births.   GYN HISTORY:  Menarche at 65 years old. The patient has had a  hysterectomy.  Denies any history of sexually transmitted diseases.  She  has a remote history of an abnormal Pap smear.  Her last normal Pap  smear was May 2006.   MEDICAL HISTORY:  1. Endometriosis.  2. Migraines.  3. Irritable bowel syndrome.  4. Urinary incontinence.  5. Muscular dystrophy.  6. Pneumonia.  7. Yeast sepsis.  8. Osteopenia.   SURGICAL HISTORY:  1. Tonsillectomy.  2. Appendectomy.  3. Vocal cord polypectomy.  4. Bilateral tubal ligation.  5. Hysterectomy.  6. Breast implants.   FAMILY HISTORY:  Hypertension, thyroid disease, asthma, diabetes  mellitus, stroke, multiple cancers including ovarian cancer (sister).   SOCIAL HISTORY:  The patient is a retired Engineer, civil (consulting).  She lives with her  husband.   HABITS:  She does not use tobacco.  She occasionally consumes alcohol.   CURRENT MEDICATIONS:  Mucinex, Robaxin, Maxalt.   ALLERGIES:  DEMEROL, MORPHINE, SULFA, AMOXICILLIN,  STEROIDS.   REVIEW OF SYSTEMS:  The patient does wear glasses.  She denies any chest  pain, shortness of breath, fever, dysuria, urinary frequency, flank  pain, nausea, vomiting, diarrhea and, except as is mentioned in History  of Present Illness, the patient's Review of Systems is negative.   PHYSICAL EXAMINATION:  VITAL SIGNS:  Blood pressure is 138/80.  Weight  is 119.  Height is 5 feet 6-3/4 inches tall.  Pulse is 88.  NECK:  Without masses or thyromegaly.  HEART:  Regular rate and rhythm.  LUNGS:  Clear.  BACK:  No CVA tenderness.  ABDOMEN:  No tenderness, masses, organomegaly.  EXTREMITIES:  No clubbing, cyanosis or edema.  PELVIC:  EG-BUS is normal.  Vagina is normal.  Cervix and uterus are  surgically absent.  Adnexa without masses or tenderness.   IMPRESSION:  Stress urinary incontinence.   DISPOSITION:  A discussion was held with the patient regarding the  indications for procedure along with the risks which include but are not  limited to reaction to anesthesia, damage to adjacent organs, infection,  excessive bleeding, and the  risks of erosion from the tension-free  vaginal tape.  The patient verbalized understanding of these risk and  has consented to proceed with placement of tension-free vaginal tape and  cystoscopy at [facility] on March 05, 2007, at  7:30 a.m.      [place] J. Adline Peals.      Osborn Coho, M.D.  Electronically Signed    EJP/MEDQ  D:  02/26/2007  T:  02/26/2007  Job:  [id number]

## 2010-07-19 NOTE — Op Note (Signed)
NAMEBREANDA, Alicia [name]             ACCOUNT NO.:  [id number]   MEDICAL RECORD NO.:  [id number]          PATIENT TYPE:  OIB   LOCATION:  9312                          [facility]ILITY:  [facility]   PHYSICIAN:  Osborn Coho, M.D.   DATE OF BIRTH:  [birth year]   DATE OF PROCEDURE:  03/05/2007  DATE OF DISCHARGE:  03/06/2007                               OPERATIVE REPORT   PREOPERATIVE DIAGNOSIS:  Stress urinary incontinence.   POSTOPERATIVE DIAGNOSIS:  Stress urinary incontinence.   PROCEDURES:  1. Tension-free Vaginal Tape (TVT).  2. Cystoscopy.   ATTENDING:  Osborn Coho, M.D.   ASSISTANT:  Marquis Lunch. Powell, P.A.-C.   ANESTHESIA:  General via LMA.   FLUIDS:  2 liters.   URINE OUTPUT:  150 mL.   ESTIMATED BLOOD LOSS:  150 mL.   COMPLICATIONS:  None.   DESCRIPTION OF PROCEDURE:  The patient was taken to the operating room  after the risks, benefits, and alternatives reviewed with the patient.  The patient verbalized understanding, consent signed and witnessed.  The  patient was placed under general per anesthesia and prepped and draped  in normal sterile fashion in the dorsal lithotomy position.  A weighted  speculum was placed in the patient's vagina and the anterior vaginal  wall was injected with dilute Pitressin.  An incision was made in the  vaginal mucosa on the anterior wall at the level of the mid urethra.  The vaginal mucosa was then dissected away from the underlying tissue  down to the level of the lower edge of the symphysis pubis.  This was  done bilaterally.  Attention was then turned to the mons pubis, where  two, approximately 5-mm incisions were made, each approximately two  fingerbreadths from the midline bilaterally at the upper edge of the  symphysis pubis.  The bladder was drained completely and the rigid  urethral catheter guide placed with an 18-French Foley into the urethra  and bladder.  The catheter guide was then deflected to the patient's  right and  transabdominal guide passed from the incision on the mons  pubis on the ipsilateral side down through the space of Retzius, and out  through the incision on the anterior vaginal wall.  The same was done on  the contralateral side.  Cystoscopy was performed and no inadvertent  bladder injury was noted.  The bladder was then drained completely and  the rigid urethral catheter guide placed again and deflected to the  patient's right.  The mesh was placed on the transabdominal guides and  the mesh was elevated up through the vaginal wall incision up through  the space of Retzius, and the incision on the mons pubis on the  patient's right.  The same was done on the contralateral side while  deflecting the catheter guide to that side as well.  Cystoscopy was  performed once again and no inadvertent bladder injury was noted and  bilateral efflux was noted from the ureters.  While placing a Kelly  beneath the urethra, and between the urethra and the mesh, the plastic  sheath was removed bilaterally leaving the mesh slightly  slack beneath  the mid urethra.  The mesh was then cut flush with the incisions at the  mons pubis.  The anterior vaginal wall mucosa was repaired with 2-0  Vicryl via a running interlocking stitch.  The incisions on the mons  pubis were repaired with Dermabond.  The vagina was packed tightly with  2-inch plain packing tied to 1-inch plain packing, both soaked in  estrogen cream.  This was done secondary to some bleeding that was noted  during the case which was brought to slow ooze with pressure.  The  packing is to be left in overnight and removed in the morning with the  Foley catheter.  All instruments were removed prior to placement of  vaginal packing.  Sponge, lap and needle count was correct.  The patient  tolerated the procedure well and was returned to the recovery room in  good condition.      Osborn Coho, M.D.  Electronically Signed     AR/MEDQ  D:   03/07/2007  T:  03/07/2007  Job:  [id number]

## 2010-07-22 NOTE — Assessment & Plan Note (Signed)
[facility]                        [facility] OFFICE NOTE   NAME:Alicia [name], Alicia [name]                    MRN:          [id number]  DATE:02/08/2006                            DOB:          Jul 07, 1945    HISTORY OF PRESENT ILLNESS:  Alicia [name] was seen January 29, 2006,  for follow up of her thyroid nodule and dyslipidemia;she had an acute  problem of low back pain.  This is associated with aggravation of  LS  area pain with straight leg raising.  She has been on bed rest as she  believes are Dr. Imagene Gurney recommendations.  There is no radicular  component.  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   She has been walking 30-45 minutes per day and states that she has  finally gotten serious about a low fat diet.  Her weight is up 5  pounds, but she attributes this to her winter clothing.   PHYSICAL EXAMINATION:  VITAL SIGNS:  Pulse 60, respirations 16, blood  pressure 128/62.  HEENT:  Thyroid was irregular, and slightly tender on the right.  HEART:  She had a grade 1/2 to 1 systolic murmur.  EXTREMITIES:  She had pain at 60 degrees with elevation of right lower  extremity.  Her posture tends to be upright.  Heel and toe walking were  normal.  She had normal biceps reflexes.  No reflexes could be elicited  at the knees.  She exhibits a low back pattern of crawling down onto the  table.   STUDIES:  The bone density was reviewed.  This reveals osteopenia.  Calcium at 1500 mg daily and 1000 IU of vitamin D were recommended.  It  was also recommended that she avoid epidural steroids or steroids in any  other form because of the osteopenia if possible.   Stretching exercises were recommended for the back.   Ultrasound of the neck was completed November 27 which revealed a  multinodular goiter which was relatively stable in reference to the  nodules. They were solid and slightly hypoechoic.  The  largest was  1.7x0.8x1.1 cm.  The smaller was 1.5x0.7x0.9 cm.  There was no change  compared to the study of October 13, 2005, and follow up in six months  (May 2008) was recommended.  This may be completed in [place] as she  is only in [place] part of the year.   Follow up lipids reveal a total cholesterol of 253 with an LDL of 123.  Her HDL was 104.  Free T4 was 0.8, minimally reduced but not significant  in view of the normal free T3 of 3.1 and TSH of 2.65.  These were  discussed with her.  It was recommended that the labs be repeated in May  2008 after continued diet and exercise intervention.  Based on her NMR,  her LDL goal was less than 120.     Titus Dubin. Alwyn Ren, MD,FACP,FCCP  Electronically Signed    [facility]/MedQ  DD: 02/08/2006  DT: 02/08/2006  Job #: [id number]

## 2010-07-22 NOTE — Procedures (Signed)
[facility]  Patient:    Alicia [name], INLOW Visit Number: [id number] MRN: [id number]          Service Type: END Location: ENDO Attending Physician:  Louie Bun Proc. Date: 01/09/01 Admit Date:  01/09/2001 Discharge Date: 01/09/2001   CC:         Amada Jupiter T. Dreiling, M.D.   Procedure Report  PROCEDURE:  Colonoscopy with biopsy.  INDICATION FOR PROCEDURE:  Chronic diarrhea, gaseousness, bloating, and dyspepsia with extensive negative work-up and failure to respond adequately to empiric medical trials.  Procedure is primarily to rule out collagenous colitis or other forms of inflammatory bowel disease including the colon and terminal ileum.  DESCRIPTION OF PROCEDURE:  The patient was placed in the left lateral decubitus position and placed on the pulse monitor with continuous low-flow oxygen delivered by nasal cannula.  She was sedated with 125 mcg IV fentanyl and 12 mg IV Versed.  The Olympus video colonoscope was inserted into the rectum and advanced to the cecum, confirmed by transillumination at McBurneys point and visualization of the ileocecal valve and appendiceal orifice.  The prep was good.  The terminal ileum was entered briefly and appeared normal. No biopsies were taken there.  The cecum, ascending, transverse, descending, and sigmoid colon all displayed normal mucosa with no masses, polyps, diverticula, or any visible inflammatory changes.  The rectum likewise appeared normal, and retroflexed view of the anus revealed no obvious internal hemorrhoids.  Biopsies were taken of the rectum and the sigmoid to rule out collagenous or microscopic colitis.  The scope was then withdrawn, and the patient returned to the recovery room in stable condition.  She tolerated the procedure well, and there were no immediate complications.  IMPRESSION:  Essentially normal colonoscopy including terminal ileum.  PLAN:  Await biopsies to rule out  collagenous colitis and pursue other work-up as indicated. Attending Physician:  Louie Bun DD:  01/09/01 TD:  01/11/01 Job: [id number]

## 2010-07-22 NOTE — Procedures (Signed)
[facility]  Patient:    Alicia [name], Alicia [name] Visit Number: [id number] MRN: [id number]          Service Type: END Location: ENDO Attending Physician:  Louie Bun Proc. Date: 12/05/00 Admit Date:  12/05/2000   CC:         Amada Jupiter T. Dreiling, M.D.   Procedure Report  PROCEDURE:  Esophagogastroduodenoscopy with biopsy.  INDICATION FOR PROCEDURE:  Chronic diarrhea with other superimposed symptoms of vague nausea, complaints of queasy stomach, borborygmi, with failure to respond to empiric trials and negative ova & parasites and fecal fat.  The procedure is primarily to assess the villi of the proximal small intestine for a sprue-like lesion and to assess for any other mucosal abnormalities through the upper GI tract contributing to her symptoms.  DESCRIPTION OF PROCEDURE:   The patient was placed in the left lateral decubitus position and placed on the pulse monitor with continuous low-flow oxygen delivered by nasal cannula.  She was sedated with 75 mcg IV fentanyl and 6.5 mg IV Versed.  The Olympus video endoscope was advanced under direct vision into the oropharynx and esophagus.  The esophagus was straight and of normal caliber with the squamocolumnar line at 38 cm.  It was somewhat irregular, and there were one or two small islands of columnar epithelium above the main Z-line consistent with mile esophagitis that I do not think has met criteria for Barretts esophagus.  There was no visible ring or stricture and no visible hiatal hernia.  The stomach was entered, and a small amount of liquid secretions were suctioned from the fundus.  Retroflex view of the cardia was unremarkable.  The fundus, body, antrum, and pylorus all appeared normal.  The duodenum was entered, and both the bulb and the second portion were well inspected and appeared to be within normal limits.  Small bowel biopsies were obtained.  The scope was withdrawn back into the  stomach, and a CLOtest obtained as well.  The scope was then withdrawn, and the patient returned to the recovery room in stable condition.  She tolerated the procedure well, and there were no immediate complications.  IMPRESSION: 1. Mild esophagitis. 2. Otherwise normal endoscopy.  PLAN:  Await CLOtest and small bowel biopsies. Attending Physician:  Louie Bun DD:  12/05/00 TD:  12/06/00 Job: [id number]

## 2010-11-10 ENCOUNTER — Ambulatory Visit: Payer: Medicare Other | Attending: Neurology

## 2010-11-10 DIAGNOSIS — M25559 Pain in unspecified hip: Secondary | ICD-10-CM | POA: Insufficient documentation

## 2010-11-10 DIAGNOSIS — IMO0001 Reserved for inherently not codable concepts without codable children: Secondary | ICD-10-CM | POA: Insufficient documentation

## 2010-11-10 DIAGNOSIS — M6281 Muscle weakness (generalized): Secondary | ICD-10-CM | POA: Insufficient documentation

## 2010-11-10 DIAGNOSIS — M545 Low back pain, unspecified: Secondary | ICD-10-CM | POA: Insufficient documentation

## 2010-11-15 ENCOUNTER — Encounter: Payer: Medicare Other | Admitting: Physical Therapy

## 2010-11-18 ENCOUNTER — Ambulatory Visit: Payer: Medicare Other | Admitting: Physical Therapy

## 2010-11-21 ENCOUNTER — Ambulatory Visit: Payer: Medicare Other | Admitting: Physical Therapy

## 2010-11-23 ENCOUNTER — Ambulatory Visit: Payer: Medicare Other | Admitting: Physical Therapy

## 2010-11-25 ENCOUNTER — Ambulatory Visit: Payer: Medicare Other | Admitting: Physical Therapy

## 2010-11-28 ENCOUNTER — Ambulatory Visit: Payer: Medicare Other | Admitting: Rehabilitation

## 2010-11-29 ENCOUNTER — Ambulatory Visit: Payer: Medicare Other | Admitting: Physical Therapy

## 2010-12-01 ENCOUNTER — Ambulatory Visit: Payer: Medicare Other | Admitting: Physical Therapy

## 2010-12-05 ENCOUNTER — Encounter: Payer: Medicare Other | Admitting: Physical Therapy

## 2010-12-07 ENCOUNTER — Ambulatory Visit: Payer: Medicare Other | Attending: Neurology | Admitting: Physical Therapy

## 2010-12-07 DIAGNOSIS — M545 Low back pain, unspecified: Secondary | ICD-10-CM | POA: Insufficient documentation

## 2010-12-07 DIAGNOSIS — M25559 Pain in unspecified hip: Secondary | ICD-10-CM | POA: Insufficient documentation

## 2010-12-07 DIAGNOSIS — M6281 Muscle weakness (generalized): Secondary | ICD-10-CM | POA: Insufficient documentation

## 2010-12-07 DIAGNOSIS — IMO0001 Reserved for inherently not codable concepts without codable children: Secondary | ICD-10-CM | POA: Insufficient documentation

## 2010-12-09 ENCOUNTER — Encounter: Payer: Self-pay | Admitting: Internal Medicine

## 2010-12-09 ENCOUNTER — Ambulatory Visit: Payer: Medicare Other | Admitting: Physical Therapy

## 2010-12-09 LAB — CBC
HCT: 31.5 — ABNORMAL LOW
HCT: 34.9 — ABNORMAL LOW
Hemoglobin: 11 — ABNORMAL LOW
Hemoglobin: 12
Hemoglobin: 14.6
MCHC: 34.3
MCV: 92.1
Platelets: 249
Platelets: 310
RBC: 3.47 — ABNORMAL LOW
RDW: 13.5
RDW: 13.8
WBC: 8.1

## 2010-12-09 LAB — BASIC METABOLIC PANEL
BUN: 2 — ABNORMAL LOW
Calcium: 9.8
Chloride: 106
GFR calc Af Amer: >60
GFR calc non Af Amer: >60
GFR calc non Af Amer: >60
Glucose, Bld: 107 — ABNORMAL HIGH
Potassium: 3.3 — ABNORMAL LOW
Potassium: 4.7
Sodium: 138
Sodium: 140

## 2010-12-13 ENCOUNTER — Ambulatory Visit: Payer: Medicare Other | Admitting: Physical Therapy

## 2010-12-15 ENCOUNTER — Ambulatory Visit: Payer: Medicare Other | Admitting: Physical Therapy

## 2010-12-19 ENCOUNTER — Ambulatory Visit: Payer: Medicare Other | Admitting: Physical Therapy

## 2010-12-21 ENCOUNTER — Ambulatory Visit: Payer: Medicare Other | Admitting: Physical Therapy

## 2011-01-03 ENCOUNTER — Ambulatory Visit: Payer: Medicare Other | Admitting: Physical Therapy

## 2011-01-05 ENCOUNTER — Ambulatory Visit: Payer: Medicare Other | Attending: Neurology | Admitting: Physical Therapy

## 2011-01-05 DIAGNOSIS — M25559 Pain in unspecified hip: Secondary | ICD-10-CM | POA: Insufficient documentation

## 2011-01-05 DIAGNOSIS — M545 Low back pain, unspecified: Secondary | ICD-10-CM | POA: Insufficient documentation

## 2011-01-05 DIAGNOSIS — IMO0001 Reserved for inherently not codable concepts without codable children: Secondary | ICD-10-CM | POA: Insufficient documentation

## 2011-01-05 DIAGNOSIS — M6281 Muscle weakness (generalized): Secondary | ICD-10-CM | POA: Insufficient documentation

## 2011-01-10 ENCOUNTER — Ambulatory Visit: Payer: Medicare Other | Admitting: Physical Therapy

## 2011-01-12 ENCOUNTER — Ambulatory Visit: Payer: Medicare Other | Admitting: Physical Therapy

## 2011-01-17 ENCOUNTER — Ambulatory Visit: Payer: Medicare Other | Admitting: Physical Therapy

## 2011-01-19 ENCOUNTER — Ambulatory Visit: Payer: Medicare Other | Admitting: Physical Therapy

## 2011-01-23 ENCOUNTER — Other Ambulatory Visit: Payer: Self-pay | Admitting: Neurology

## 2011-01-23 DIAGNOSIS — G544 Lumbosacral root disorders, not elsewhere classified: Secondary | ICD-10-CM

## 2011-01-23 DIAGNOSIS — M549 Dorsalgia, unspecified: Secondary | ICD-10-CM

## 2011-01-24 ENCOUNTER — Ambulatory Visit: Payer: Medicare Other | Admitting: Physical Therapy

## 2011-01-27 ENCOUNTER — Ambulatory Visit
Admission: RE | Admit: 2011-01-27 | Discharge: 2011-01-27 | Disposition: A | Discharge status: Home / Self Care | Payer: Medicare Other | Source: Ambulatory Visit | Attending: Neurology | Admitting: Neurology

## 2011-01-27 DIAGNOSIS — G544 Lumbosacral root disorders, not elsewhere classified: Secondary | ICD-10-CM

## 2011-01-27 DIAGNOSIS — M549 Dorsalgia, unspecified: Secondary | ICD-10-CM

## 2011-01-31 ENCOUNTER — Other Ambulatory Visit: Payer: Self-pay | Admitting: Neurology

## 2011-01-31 DIAGNOSIS — G8929 Other chronic pain: Secondary | ICD-10-CM

## 2011-02-07 ENCOUNTER — Ambulatory Visit: Payer: Medicare Other | Admitting: Physical Therapy

## 2011-02-08 ENCOUNTER — Ambulatory Visit (INDEPENDENT_AMBULATORY_CARE_PROVIDER_SITE_OTHER): Payer: Medicare Other | Admitting: Internal Medicine

## 2011-02-08 ENCOUNTER — Other Ambulatory Visit: Payer: Medicare Other

## 2011-02-08 ENCOUNTER — Encounter: Payer: Self-pay | Admitting: Internal Medicine

## 2011-02-08 VITALS — BP 156/80 | HR 102 | Temp 98.2°F | Ht 66.5 in | Wt 119.0 lb

## 2011-02-08 DIAGNOSIS — R05 Cough: Secondary | ICD-10-CM | POA: Insufficient documentation

## 2011-02-08 DIAGNOSIS — R059 Cough, unspecified: Secondary | ICD-10-CM | POA: Insufficient documentation

## 2011-02-08 MED ORDER — CHLORPHENIRAMINE-HYDROCODONE 8-10 MG/5ML PO LQCR: 5.0000 mL | Freq: Two times a day (BID) | ORAL | Status: DC | PRN

## 2011-02-08 MED ORDER — CEFPROZIL 500 MG PO TABS: 500.0000 mg | ORAL_TABLET | Freq: Two times a day (BID) | ORAL | Status: AC

## 2011-02-08 NOTE — Progress Notes (Signed)
  Subjective:    Patient ID: Alicia [name], female    DOB: [birth year], 65 y.o.   MRN: [id number]  HPI Got sick a week ago with "throat congestion", hoarseness, cough. 3 days ago symptoms got worse and she started noticing abundant greenish sputum mostly in the morning.   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 Decreased appetite for one day. No hemoptysis Some tightness in the chest and a wheezy feeling mostly in the morning, symptoms are better after she cough sputum. Denies any history of asthma, has never smoked. No sinus pain or congestion.     Objective:   Physical Exam  Constitutional: She is oriented to person, place, and time. She appears well-developed and well-nourished.       Not in distress but frequent cough noted.  HENT:  Head: Normocephalic and atraumatic.  Right Ear: External ear normal.  Left Ear: External ear normal.       Face is symmetric, nontender to palpation of the sinuses, nose congested, throat without redness.  Neck:       hoarseness  noted  Cardiovascular:       Regular rate and rhythm, heart rate in the 90s, no murmur  Pulmonary/Chest:       Few rhonchi, no wheezing or increased work of breathing.  Musculoskeletal: She  exhibits no edema.  Neurological: She is alert and oriented to person, place, and time.  Skin: She is not diaphoretic.  Psychiatric: She has a normal mood and affect. Her behavior is normal. Judgment and thought content normal.       Assessment & Plan:

## 2011-02-08 NOTE — Patient Instructions (Signed)
Get a chext XR Rest, fluids , tylenol For cough, take Mucinex DM twice a day as needed  If the cough is severe: tussionex twice a day, will cause drowsiness Take the antibiotic as prescribed ----> cefzil Call if no better in few days Call anytime if the symptoms are severe

## 2011-02-08 NOTE — Assessment & Plan Note (Addendum)
1 weeks h/o cough, hoarseness. will get a CXR to be sure there is no infiltrate given the abundant secretions (per pt) Also reports that she can tolerate only few abx (due to bowel flora issues), ceftin is one of them . See instructions

## 2011-02-09 ENCOUNTER — Ambulatory Visit (INDEPENDENT_AMBULATORY_CARE_PROVIDER_SITE_OTHER)
Admission: RE | Admit: 2011-02-09 | Discharge: 2011-02-09 | Disposition: A | Discharge status: Home / Self Care | Payer: Medicare Other | Source: Ambulatory Visit | Attending: Internal Medicine | Admitting: Internal Medicine

## 2011-02-09 DIAGNOSIS — R05 Cough: Secondary | ICD-10-CM

## 2011-02-09 DIAGNOSIS — R059 Cough, unspecified: Secondary | ICD-10-CM

## 2011-02-10 ENCOUNTER — Telehealth: Payer: Self-pay | Admitting: Internal Medicine

## 2011-02-13 ENCOUNTER — Telehealth: Payer: Self-pay | Admitting: Internal Medicine

## 2011-02-14 ENCOUNTER — Ambulatory Visit: Payer: Medicare Other | Admitting: Physical Therapy

## 2011-02-14 NOTE — Telephone Encounter (Signed)
DUPLICATE PHONE NOTE, I JUST RECEIVED NOTE CREATED ON 02/10/11 TODAY

## 2011-02-14 NOTE — Telephone Encounter (Signed)
Spoke with patient's husband, informed him chest xray was normal.

## 2011-02-17 ENCOUNTER — Ambulatory Visit
Admission: RE | Admit: 2011-02-17 | Discharge: 2011-02-17 | Disposition: A | Discharge status: Home / Self Care | Payer: Medicare Other | Source: Ambulatory Visit | Attending: Neurology | Admitting: Neurology

## 2011-02-17 DIAGNOSIS — M549 Dorsalgia, unspecified: Secondary | ICD-10-CM

## 2011-02-17 DIAGNOSIS — G8929 Other chronic pain: Secondary | ICD-10-CM

## 2011-02-17 MED ORDER — IOHEXOL 180 MG/ML  SOLN
1.0000 mL | Freq: Once | INTRAMUSCULAR | Status: AC | PRN
  Administered 2011-02-17: 1 mL via EPIDURAL

## 2011-02-17 MED ORDER — METHYLPREDNISOLONE ACETATE 40 MG/ML INJ SUSP (RADIOLOG
120.0000 mg | Freq: Once | INTRAMUSCULAR | Status: AC
  Administered 2011-02-17: 120 mg via EPIDURAL

## 2011-02-17 NOTE — Patient Instructions (Signed)

## 2011-02-23 ENCOUNTER — Encounter: Payer: Medicare Other | Admitting: Physical Therapy

## 2011-02-24 ENCOUNTER — Ambulatory Visit (INDEPENDENT_AMBULATORY_CARE_PROVIDER_SITE_OTHER): Payer: Medicare Other | Admitting: Internal Medicine

## 2011-02-24 ENCOUNTER — Encounter: Payer: Self-pay | Admitting: Internal Medicine

## 2011-02-24 VITALS — BP 146/90 | HR 87 | Temp 98.0°F | Ht 66.5 in | Wt 118.6 lb

## 2011-02-24 DIAGNOSIS — K5790 Diverticulosis of intestine, part unspecified, without perforation or abscess without bleeding: Secondary | ICD-10-CM

## 2011-02-24 DIAGNOSIS — K5730 Diverticulosis of large intestine without perforation or abscess without bleeding: Secondary | ICD-10-CM

## 2011-02-24 DIAGNOSIS — M899 Disorder of bone, unspecified: Secondary | ICD-10-CM

## 2011-02-24 DIAGNOSIS — Z0000 Encounter for general adult medical examination without abnormal findings: Secondary | ICD-10-CM

## 2011-02-24 DIAGNOSIS — R030 Elevated blood-pressure reading, without diagnosis of hypertension: Secondary | ICD-10-CM

## 2011-02-24 DIAGNOSIS — E785 Hyperlipidemia, unspecified: Secondary | ICD-10-CM

## 2011-02-24 DIAGNOSIS — M949 Disorder of cartilage, unspecified: Secondary | ICD-10-CM

## 2011-02-24 NOTE — Progress Notes (Signed)
Subjective:    Patient ID: Alicia [name], female    DOB: 03-22-1945, 65 y.o.   MRN: [id number]  HPI Medicare Wellness Visit:  The following psychosocial & medical history were reviewed as required by Medicare.   Social history: caffeine: 1 tea in am , alcohol:  1/2 glass of wine 3 x / week ,  tobacco use : never  & exercise : walking 20 min 5X/ week.   Home & personal  safety / fall risk: from Muscular Dystrophy, activities of daily living: no limitations except heavy housework due to scoliosis , seatbelt use : yes , and smoke alarm employment : yes .  Power of Attorney/Living Will status : in place  Vision ( as recorded per Nurse) & Hearing  evaluation :  Ophth exam 8/12 , no issues. 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 Depression / anxiety: denied but recent stress @ home with service personnel Travel history : 35 [place] , immunization status :no Flu shot to date  , transfusion history:  Yes post appendectomy, and preventive health surveillance ( colonoscopies, BMD , etc as per protocol/ [facility]): colonoscopy up to date, Dental care:  Seen annually . Chart reviewed &  Updated. Active issues reviewed & addressed.       Review of Systems see BP; no PMH of HTN. She ate some country ham last night.  Blood pressure range: always WNL @ MD appts  Chest pain: no   Dyspnea: no   Claudication: no   Lightheadedness: yes, from neurologic meds in am, better after b'fast & mobilization   Urinary frequency: no   Edema: no  Preventitive Healthcare:  Diet Pattern: no plan  Salt Restriction: no       Objective:   Physical Exam Gen.: Thin but healthy and well-nourished in appearance. Alert, appropriate and cooperative throughout exam. Head: Normocephalic without obvious abnormalities;  Hair fine  Eyes: No corneal or conjunctival inflammation noted. Pupils equal round reactive  to light and accommodation.  Extraocular motion intact.  Ears: External  ear exam reveals no significant lesions or deformities. Canals clear .TMs normal. Hearing is grossly normal bilaterally. Nose: External nasal exam reveals no deformity or inflammation. Nasal mucosa are pink and moist. No lesions or exudates noted.  Mouth: Oral mucosa and oropharynx reveal no lesions or exudates. Teeth in good repair. Neck: No deformities, masses, or tenderness noted. Range of motion slightly decreased Thyroid physiologic asymmetry. Lungs: Normal respiratory effort; chest expands symmetrically. Lungs are clear to auscultation without rales, wheezes, or increased work of breathing. Heart: Normal rate and rhythm. Normal S1 and S2. No gallop, click, or rub. Grade 1/6 systolic murmur. Abdomen: Bowel sounds normal; abdomen soft and nontender. No masses, organomegaly or hernias noted. Aorta is palpable; no aortic aneurysm is present. No renal artery  bruits present Genitalia: Dr Su Hilt   .                                                                                   Musculoskeletal/extremities: No deformity or scoliosis noted of  the  thoracic or lumbar spine. No clubbing, cyanosis, edema, or deformity noted. Range of motion  normal .Tone & strength  normal.Joints normal. Nail health  good. Vascular: Carotid, radial artery, dorsalis pedis and  posterior tibial pulses are full and equal. No bruits present. Neurologic: Alert and oriented x3. Deep tendon reflex decreased L knee         Skin: Intact without suspicious lesions or rashes. Lymph: No cervical, axillary  lymphadenopathy present. Psych: Mood and affect are normal. Normally interactive                                                                                         Assessment & Plan:  #1 Medicare Wellness Exam; criteria met ; data entered #2 Problem List reviewed ; Assessment/ Recommendations made.  #3 elevated blood pressure without  diagnosis of hypertension. She is to monitor the blood pressure. She should avoid decongestants & excess salt intake  which can elevate her pressure Plan: see Orders

## 2011-02-24 NOTE — Patient Instructions (Signed)
Blood Pressure Goal  Ideally is an AVERAGE < 135/85. This AVERAGE should be calculated from @ least 5-7 BP readings taken @ different times of day on different days of week. You should not respond to isolated BP readings , but rather the AVERAGE for that week. Please  schedule fasting Labs : BMET,Lipids, hepatic panel, CBC & dif, TSH,vitamin D level. PLEASE BRING THESE INSTRUCTIONS TO FOLLOW UP  LAB APPOINTMENT.This will guarantee correct labs are drawn, eliminating need for repeat blood sampling ( needle sticks ! ). Diagnoses /Codes: elevated BP w/o HTN, osteopenia, diverticulosis , hyperlipidemia

## 2011-03-02 ENCOUNTER — Other Ambulatory Visit: Payer: Self-pay | Admitting: Internal Medicine

## 2011-03-02 DIAGNOSIS — E785 Hyperlipidemia, unspecified: Secondary | ICD-10-CM

## 2011-03-02 DIAGNOSIS — K5790 Diverticulosis of intestine, part unspecified, without perforation or abscess without bleeding: Secondary | ICD-10-CM

## 2011-03-02 DIAGNOSIS — M8580 Other specified disorders of bone density and structure, unspecified site: Secondary | ICD-10-CM

## 2011-03-02 DIAGNOSIS — R030 Elevated blood-pressure reading, without diagnosis of hypertension: Secondary | ICD-10-CM

## 2011-03-03 ENCOUNTER — Other Ambulatory Visit (INDEPENDENT_AMBULATORY_CARE_PROVIDER_SITE_OTHER): Payer: Medicare Other

## 2011-03-03 DIAGNOSIS — K5730 Diverticulosis of large intestine without perforation or abscess without bleeding: Secondary | ICD-10-CM

## 2011-03-03 DIAGNOSIS — R030 Elevated blood-pressure reading, without diagnosis of hypertension: Secondary | ICD-10-CM

## 2011-03-03 DIAGNOSIS — E785 Hyperlipidemia, unspecified: Secondary | ICD-10-CM

## 2011-03-03 DIAGNOSIS — M949 Disorder of cartilage, unspecified: Secondary | ICD-10-CM

## 2011-03-03 DIAGNOSIS — M899 Disorder of bone, unspecified: Secondary | ICD-10-CM

## 2011-03-03 DIAGNOSIS — M8580 Other specified disorders of bone density and structure, unspecified site: Secondary | ICD-10-CM

## 2011-03-03 DIAGNOSIS — K5790 Diverticulosis of intestine, part unspecified, without perforation or abscess without bleeding: Secondary | ICD-10-CM

## 2011-03-03 LAB — CBC WITH DIFFERENTIAL/PLATELET
Basophils Absolute: 0 10*3/uL (ref 0.0–0.1)
HCT: 41.6 % (ref 36.0–46.0)
Hemoglobin: 14.1 g/dL (ref 12.0–15.0)
Lymphs Abs: 1.4 10*3/uL (ref 0.7–4.0)
MCHC: 33.8 g/dL (ref 30.0–36.0)
Monocytes Relative: 8.9 % (ref 3.0–12.0)
Neutro Abs: 2.1 10*3/uL (ref 1.4–7.7)
RDW: 13.8 % (ref 11.5–14.6)

## 2011-03-03 LAB — BASIC METABOLIC PANEL
BUN: 11 mg/dL (ref 6–23)
Chloride: 102 mEq/L (ref 96–112)
Glucose, Bld: 103 mg/dL — ABNORMAL HIGH (ref 70–99)
Potassium: 4.6 mEq/L (ref 3.5–5.1)

## 2011-03-03 LAB — HEPATIC FUNCTION PANEL
Alkaline Phosphatase: 56 U/L (ref 39–117)
Bilirubin, Direct: 0 mg/dL (ref 0.0–0.3)
Total Bilirubin: 0.6 mg/dL (ref 0.3–1.2)

## 2011-03-03 LAB — LIPID PANEL
Total CHOL/HDL Ratio: 2
VLDL: 12.2 mg/dL (ref 0.0–40.0)

## 2011-03-03 NOTE — Progress Notes (Signed)
LABS ONLY  

## 2011-03-04 LAB — VITAMIN D 25 HYDROXY (VIT D DEFICIENCY, FRACTURES): Vit D, 25-Hydroxy: 73 ng/mL (ref 30–89)

## 2011-03-17 DIAGNOSIS — Z23 Encounter for immunization: Secondary | ICD-10-CM | POA: Diagnosis not present

## 2011-03-29 DIAGNOSIS — L821 Other seborrheic keratosis: Secondary | ICD-10-CM | POA: Diagnosis not present

## 2011-03-29 DIAGNOSIS — L570 Actinic keratosis: Secondary | ICD-10-CM | POA: Diagnosis not present

## 2011-03-29 DIAGNOSIS — L259 Unspecified contact dermatitis, unspecified cause: Secondary | ICD-10-CM | POA: Diagnosis not present

## 2011-04-03 ENCOUNTER — Other Ambulatory Visit: Payer: Self-pay | Admitting: Neurology

## 2011-04-03 DIAGNOSIS — G8929 Other chronic pain: Secondary | ICD-10-CM

## 2011-04-03 DIAGNOSIS — M549 Dorsalgia, unspecified: Secondary | ICD-10-CM

## 2011-04-05 ENCOUNTER — Ambulatory Visit
Admission: RE | Admit: 2011-04-05 | Discharge: 2011-04-05 | Disposition: A | Discharge status: Home / Self Care | Payer: Medicare Other | Source: Ambulatory Visit | Attending: Neurology | Admitting: Neurology

## 2011-04-05 DIAGNOSIS — G8929 Other chronic pain: Secondary | ICD-10-CM

## 2011-04-05 DIAGNOSIS — M47817 Spondylosis without myelopathy or radiculopathy, lumbosacral region: Secondary | ICD-10-CM | POA: Diagnosis not present

## 2011-04-05 DIAGNOSIS — M549 Dorsalgia, unspecified: Secondary | ICD-10-CM

## 2011-04-05 MED ORDER — IOHEXOL 180 MG/ML  SOLN
1.0000 mL | Freq: Once | INTRAMUSCULAR | Status: AC | PRN
  Administered 2011-04-05: 1 mL via EPIDURAL

## 2011-04-05 MED ORDER — METHYLPREDNISOLONE ACETATE 40 MG/ML INJ SUSP (RADIOLOG
120.0000 mg | Freq: Once | INTRAMUSCULAR | Status: AC
  Administered 2011-04-05: 120 mg via EPIDURAL

## 2011-05-18 DIAGNOSIS — G7109 Other specified muscular dystrophies: Secondary | ICD-10-CM | POA: Diagnosis not present

## 2011-05-18 DIAGNOSIS — G609 Hereditary and idiopathic neuropathy, unspecified: Secondary | ICD-10-CM | POA: Diagnosis not present

## 2011-05-19 ENCOUNTER — Other Ambulatory Visit: Payer: Self-pay | Admitting: Neurology

## 2011-05-19 DIAGNOSIS — G544 Lumbosacral root disorders, not elsewhere classified: Secondary | ICD-10-CM

## 2011-05-30 DIAGNOSIS — R0989 Other specified symptoms and signs involving the circulatory and respiratory systems: Secondary | ICD-10-CM | POA: Diagnosis not present

## 2011-06-07 ENCOUNTER — Ambulatory Visit
Admission: RE | Admit: 2011-06-07 | Discharge: 2011-06-07 | Disposition: A | Discharge status: Home / Self Care | Payer: Medicare Other | Source: Ambulatory Visit | Attending: Neurology | Admitting: Neurology

## 2011-06-07 DIAGNOSIS — M47817 Spondylosis without myelopathy or radiculopathy, lumbosacral region: Secondary | ICD-10-CM | POA: Diagnosis not present

## 2011-06-07 DIAGNOSIS — G544 Lumbosacral root disorders, not elsewhere classified: Secondary | ICD-10-CM

## 2011-06-07 MED ORDER — METHYLPREDNISOLONE ACETATE 40 MG/ML INJ SUSP (RADIOLOG: 120.0000 mg | Freq: Once | INTRAMUSCULAR | Status: DC

## 2011-06-07 MED ORDER — IOHEXOL 180 MG/ML  SOLN: 1.0000 mL | Freq: Once | INTRAMUSCULAR | Status: AC | PRN

## 2011-06-09 ENCOUNTER — Encounter: Payer: Self-pay | Admitting: *Deleted

## 2011-07-06 DIAGNOSIS — IMO0002 Reserved for concepts with insufficient information to code with codable children: Secondary | ICD-10-CM | POA: Diagnosis not present

## 2011-07-28 DIAGNOSIS — M62838 Other muscle spasm: Secondary | ICD-10-CM | POA: Diagnosis not present

## 2011-08-04 DIAGNOSIS — M545 Low back pain, unspecified: Secondary | ICD-10-CM | POA: Diagnosis not present

## 2011-08-11 DIAGNOSIS — M545 Low back pain, unspecified: Secondary | ICD-10-CM | POA: Diagnosis not present

## 2011-08-18 DIAGNOSIS — M545 Low back pain, unspecified: Secondary | ICD-10-CM | POA: Diagnosis not present

## 2011-09-04 DIAGNOSIS — M79609 Pain in unspecified limb: Secondary | ICD-10-CM | POA: Diagnosis not present

## 2011-09-28 DIAGNOSIS — M79609 Pain in unspecified limb: Secondary | ICD-10-CM | POA: Diagnosis not present

## 2011-10-19 DIAGNOSIS — IMO0002 Reserved for concepts with insufficient information to code with codable children: Secondary | ICD-10-CM | POA: Diagnosis not present

## 2011-11-08 DIAGNOSIS — H524 Presbyopia: Secondary | ICD-10-CM | POA: Diagnosis not present

## 2011-11-08 DIAGNOSIS — H353190 Nonexudative age-related macular degeneration, unspecified eye, stage unspecified: Secondary | ICD-10-CM | POA: Diagnosis not present

## 2011-11-08 DIAGNOSIS — H52229 Regular astigmatism, unspecified eye: Secondary | ICD-10-CM | POA: Diagnosis not present

## 2011-11-08 DIAGNOSIS — H11009 Unspecified pterygium of unspecified eye: Secondary | ICD-10-CM | POA: Diagnosis not present

## 2011-11-17 ENCOUNTER — Emergency Department (HOSPITAL_COMMUNITY)
Admission: EM | Admit: 2011-11-17 | Discharge: 2011-11-17 | Disposition: A | Discharge status: Home / Self Care | Payer: Medicare Other | Attending: Emergency Medicine | Admitting: Emergency Medicine

## 2011-11-17 ENCOUNTER — Encounter (HOSPITAL_COMMUNITY): Payer: Self-pay | Admitting: Emergency Medicine

## 2011-11-17 ENCOUNTER — Emergency Department (HOSPITAL_COMMUNITY): Payer: Medicare Other

## 2011-11-17 ENCOUNTER — Telehealth: Payer: Self-pay | Admitting: Internal Medicine

## 2011-11-17 DIAGNOSIS — M899 Disorder of bone, unspecified: Secondary | ICD-10-CM | POA: Diagnosis not present

## 2011-11-17 DIAGNOSIS — F411 Generalized anxiety disorder: Secondary | ICD-10-CM | POA: Insufficient documentation

## 2011-11-17 DIAGNOSIS — R55 Syncope and collapse: Secondary | ICD-10-CM | POA: Diagnosis not present

## 2011-11-17 DIAGNOSIS — Z85828 Personal history of other malignant neoplasm of skin: Secondary | ICD-10-CM | POA: Insufficient documentation

## 2011-11-17 DIAGNOSIS — E871 Hypo-osmolality and hyponatremia: Secondary | ICD-10-CM

## 2011-11-17 DIAGNOSIS — R209 Unspecified disturbances of skin sensation: Secondary | ICD-10-CM | POA: Insufficient documentation

## 2011-11-17 DIAGNOSIS — K5900 Constipation, unspecified: Secondary | ICD-10-CM | POA: Diagnosis not present

## 2011-11-17 LAB — CBC WITH DIFFERENTIAL/PLATELET
Basophils Absolute: 0 10*3/uL (ref 0.0–0.1)
Basophils Relative: 0 % (ref 0–1)
Eosinophils Absolute: 0 10*3/uL (ref 0.0–0.7)
Hemoglobin: 13.3 g/dL (ref 12.0–15.0)
MCH: 31 pg (ref 26.0–34.0)
MCHC: 34.8 g/dL (ref 30.0–36.0)
Monocytes Relative: 4 % (ref 3–12)
Neutro Abs: 6.1 10*3/uL (ref 1.7–7.7)
Neutrophils Relative %: 81 % — ABNORMAL HIGH (ref 43–77)
Platelets: 300 10*3/uL (ref 150–400)

## 2011-11-17 LAB — URINALYSIS, ROUTINE W REFLEX MICROSCOPIC
Bilirubin Urine: NEGATIVE
Ketones, ur: NEGATIVE mg/dL
Leukocytes, UA: NEGATIVE
Nitrite: NEGATIVE
Specific Gravity, Urine: 1.021 (ref 1.005–1.030)
Urobilinogen, UA: 0.2 mg/dL (ref 0.0–1.0)
pH: 8 (ref 5.0–8.0)

## 2011-11-17 LAB — TROPONIN I: Troponin I: <0.3 ng/mL (ref <0.30)

## 2011-11-17 LAB — BASIC METABOLIC PANEL
Chloride: 90 mEq/L — ABNORMAL LOW (ref 96–112)
GFR calc Af Amer: >90 mL/min (ref >90)
GFR calc non Af Amer: >90 mL/min (ref >90)
Potassium: 3.9 mEq/L (ref 3.5–5.1)
Sodium: 128 mEq/L — ABNORMAL LOW (ref 135–145)

## 2011-11-17 MED ORDER — SODIUM CHLORIDE 0.9 % IV BOLUS (SEPSIS)
1000.0000 mL | Freq: Once | INTRAVENOUS | Status: AC
  Administered 2011-11-17: 1000 mL via INTRAVENOUS

## 2011-11-17 NOTE — ED Notes (Signed)
Pt reports numbness at neck then down to hands and toes starting this AM. Pt reports she has had this sensation before two years ago and was diagnosed with diverticulitis. Pt denies abdominal pain at this time or other similar symptoms. Pt reports this sensation has become more intense and more frequent and lasting longer. Pt reports having a "horrible feeling" that last for maybe 5 minutes followed by numbness and tingling. Pt describes the horrible feeling as a numbness and tingling washing over her.  Pt reports constant lightheadedness, worse with turning her head.  Pt states she has talked to her PCP about these sensations as she has had milder cases in the last two years, but PCP has dismissed her concerns. Pt denies syncope, shortness of breath of pain.

## 2011-11-17 NOTE — Telephone Encounter (Signed)
Noted, per Dr.Hopper's protocol, if patient appointment schedule or patient referred to ER or Urgent Care ok to close encounter.

## 2011-11-17 NOTE — ED Notes (Signed)
Graham crackers and peanut butter and drink given to pt.

## 2011-11-17 NOTE — Telephone Encounter (Signed)
Caller: Davelyn/Patient; Patient Name: Alicia [name]; PCP: Marga Melnick; Best Callback Phone Number: [phone]; Reason for call:  She awoke "feeling weird" and she got up and tried to move.  Back of neck,  arms feel heavy and having numbness in arms and hands and cool hands.  She has a history of MD. These symptoms are not like a flare. These symptoms happen when she has an infection.   She can bend her head down to her chest as far as the MD will allow.  Triaged Weakness or Paralysis and needs to be seen in the energency room for new onset of generalized weakness.  When on hold she had another "spell" and this time had tingling down the back of her thighs.   She will go to [employer] with her husband.  Instructed that if she feels weaker or cannot stand to call 911 for transport.

## 2011-11-17 NOTE — ED Provider Notes (Signed)
History     CSN: [id number]  Arrival date & time 11/17/11  1311   First MD Initiated Contact with Patient 11/17/11 1543      Chief Complaint  Patient presents with  . Numbness  . Anxiety  . Near Syncope    (Consider location/radiation/quality/duration/timing/severity/associated sxs/prior treatment) HPI Comments: For the last 24 hours pt has not been feeling well with nausea and no vomiting yesterday and then today waves of weakness and tingling involving the entire body.  States she feels weak but still able to move her arms and legs.  When it occurs it makes her anxious.  Pt denies SOB, cough, fever, abd pain or vomiting.  The history is provided by the patient.    Past Medical History  Diagnosis Date  . Endometriosis   . 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 [facility]     [facility]  . Peripheral neuropathy     Dr Sandria Manly  . Migraine   . Diverticulosis     Past Surgical History  Procedure Date  . Appendectomy     high school; low grade appendiceal cancer  . Abdominal hysterectomy     for Endometriosis  . Tubal ligation   . Tonsillectomy   . Colonoscopy   . Esi      X 3 in 2003 & 02/17/2011 for L4-S1 symptoms    Family History  Problem Relation Age of Onset  . Alcohol abuse Mother   . Hypertension Mother   . Diabetes Mother   . Stroke Mother   . Alzheimer's disease Father   . Cancer Maternal Aunt     pancreatic    History  Substance Use Topics  . Smoking status: Never Smoker   . Smokeless tobacco: Not on file  . Alcohol Use: 1.2 oz/week    2 Glasses of wine per week    OB History    Grav Para Term Preterm Abortions TAB SAB Ect Mult Living                  Review of Systems  Constitutional: Positive for fatigue. Negative for fever.  Respiratory: Negative for cough and shortness of breath.   Cardiovascular: Negative for chest pain.  Gastrointestinal: Positive for nausea. Negative for vomiting and abdominal pain.  Genitourinary: Negative for  dysuria.  All other systems reviewed and are negative.    Allergies  Sulfonamide derivatives; Alendronate sodium; Demerol; Nsaids; Risedronate sodium; and Rofecoxib  Home Medications   Current Outpatient Rx  Name Route Sig Dispense Refill  . ACETAMINOPHEN 500 MG PO TABS Oral Take 1,000 mg by mouth every 6 (six) hours as needed. For pain.    . B COMPLEX-C PO TABS Oral Take 1 tablet by mouth daily.    Marland Kitchen BUTALBITAL-APAP-CAFFEINE 50-325-40 MG PO TABS Oral Take 1 tablet by mouth every 6 (six) hours as needed. For pain.    Marland Kitchen CALCIUM & MAGNESIUM CARBONATES PO Oral Take 2-3 tablets by mouth 2 (two) times daily.    Marland Kitchen CETIRIZINE HCL 10 MG PO TABS Oral Take 10 mg by mouth daily as needed. For allergies.    Marland Kitchen ESOMEPRAZOLE MAGNESIUM 20 MG PO CPDR Oral Take 20 mg by mouth daily before breakfast.      . ESTRADIOL ACETATE 0.05 MG/24HR VA RING Vaginal Place 1 each vaginally every 3 (three) months.     Marland Kitchen GABAPENTIN 100 MG PO CAPS Oral Take 300 mg by mouth 3 (three) times daily.     Marland Kitchen  GUAIFENESIN 200 MG PO TABS Oral Take 200-400 mg by mouth 2 (two) times daily as needed. 1 tab in am, 2 tabs in pm    . LUTEIN 20 MG PO TABS Oral Take 1 tablet by mouth daily.    Marland Kitchen METHOCARBAMOL 750 MG PO TABS Oral Take 750 mg by mouth 4 (four) times daily.      . ADULT MULTIVITAMIN W/MINERALS CH Oral Take 1 tablet by mouth 2 (two) times daily.    . ICAPS PO Oral Take 1 capsule by mouth 2 (two) times daily.    . OMEGA-3-ACID ETHYL ESTERS 1 G PO CAPS Oral Take 2 g by mouth daily.    Marland Kitchen RIZATRIPTAN BENZOATE 10 MG PO TBDP Oral Take 10 mg by mouth as needed. May repeat in 2 hours if needed for migraines.      BP 182/84  Pulse 86  Temp 98.5 F (36.9 C) (Oral)  Resp 22  SpO2 100%  Physical Exam  Nursing note and vitals reviewed. Constitutional: She is oriented to person, place, and time. She appears well-developed and well-nourished. No distress.  HENT:  Head: Normocephalic and atraumatic.  Mouth/Throat: Oropharynx is  clear and moist.  Eyes: Conjunctivae normal and EOM are normal. Pupils are equal, round, and reactive to light.  Neck: Normal range of motion. Neck supple.  Cardiovascular: Normal rate, regular rhythm and intact distal pulses.   No murmur heard. Pulmonary/Chest: Effort normal and breath sounds normal. No respiratory distress. She has no wheezes. She has no rales.  Abdominal: Soft. She exhibits no distension. There is no tenderness. There is no rebound and no guarding.  Musculoskeletal: Normal range of motion. She exhibits no edema and no tenderness.  Neurological: She is alert and oriented to person, place, and time. She has normal strength. No sensory deficit. Coordination and gait normal.  Skin: Skin is warm and dry. No rash noted. No erythema.  Psychiatric: She has a normal mood and affect. Her behavior is normal.    ED Course  Procedures (including critical care time)  Labs Reviewed  CBC WITH DIFFERENTIAL - Abnormal; Notable for the following:    Neutrophils Relative 81 (*)     All other components within normal limits  BASIC METABOLIC PANEL - Abnormal; Notable for the following:    Sodium 128 (*)     Chloride 90 (*)     Glucose, Bld 103 (*)     Creatinine, Ser 0.46 (*)     All other components within normal limits  URINALYSIS, ROUTINE W REFLEX MICROSCOPIC  TROPONIN I   Dg Abd Acute W/chest  11/17/2011  *RADIOLOGY REPORT*  Clinical Data: Abdominal pain with dizziness and constipation.  ACUTE ABDOMEN SERIES (ABDOMEN 2 VIEW & CHEST 1 VIEW)  Comparison: Chest radiographs 02/09/2011.  Findings: The heart size and mediastinal contours are stable. There is biapical pleural parenchymal scarring which appears stable.  The lungs are otherwise clear.  There is no pleural effusion.  A left breast implant is again noted.  The bowel gas pattern is normal.  There is no free intraperitoneal air or suspicious calcification.  There is a probable vaginal pessary.  There are mild degenerative changes  of the lumbar spine associated with a mild convex right scoliosis.  IMPRESSION:  1.  No acute cardiopulmonary or abdominal process identified. 2.  Stable biapical parenchymal scarring.   Original Report Authenticated By: Gerrianne Scale, M.D.     Date: 11/17/2011  Rate: 74  Rhythm: normal sinus rhythm  QRS  Axis: normal  Intervals: normal  ST/T Wave abnormalities: normal  Conduction Disutrbances:none  Narrative Interpretation:   Old EKG Reviewed: none available    No diagnosis found.    MDM   Pt presenting due to not feeling well.  Started yesterday with waves of weakness.  Denies SOB, Cough, fever, abd pain or dysuria.  Pt states took cymbalta yesterday for back pain which is new.  Pt denies nausea now but nausea yesterday without vomiting.  Pt is assymptomatic on exam currently.  No focal weakness or sx concerning for stroke.   CBC, UA, CXR all wnl.  BMP with new mild hyponatremia but o/w wnl.   Pt is improved after IVF and she feels back to normal.  May be extremely sensitive to electrolyte shifts due to muscular dystrophy.  Will have f/u with PcP Dr. Alwyn Ren.        Gwyneth Sprout, MD 11/17/11 1824

## 2011-11-17 NOTE — ED Notes (Signed)
Patient states she woke up feeling this way.  Patient reports numbness and tingling from her neck to her toes, dizziness, and feeling like she's going to pass out.  Patient has diverticulitis and said she felt this way before during an exacerbation.

## 2011-11-23 ENCOUNTER — Encounter: Payer: Self-pay | Admitting: Internal Medicine

## 2011-11-23 ENCOUNTER — Ambulatory Visit (INDEPENDENT_AMBULATORY_CARE_PROVIDER_SITE_OTHER): Payer: Medicare Other | Admitting: Internal Medicine

## 2011-11-23 VITALS — BP 140/80 | HR 96 | Temp 97.6°F | Wt 118.0 lb

## 2011-11-23 DIAGNOSIS — R0989 Other specified symptoms and signs involving the circulatory and respiratory systems: Secondary | ICD-10-CM

## 2011-11-23 DIAGNOSIS — E871 Hypo-osmolality and hyponatremia: Secondary | ICD-10-CM

## 2011-11-23 DIAGNOSIS — G7100 Muscular dystrophy, unspecified: Secondary | ICD-10-CM

## 2011-11-23 DIAGNOSIS — IMO0002 Reserved for concepts with insufficient information to code with codable children: Secondary | ICD-10-CM | POA: Diagnosis not present

## 2011-11-23 DIAGNOSIS — G7109 Other specified muscular dystrophies: Secondary | ICD-10-CM

## 2011-11-23 DIAGNOSIS — E559 Vitamin D deficiency, unspecified: Secondary | ICD-10-CM

## 2011-11-23 DIAGNOSIS — M5416 Radiculopathy, lumbar region: Secondary | ICD-10-CM

## 2011-11-23 LAB — BASIC METABOLIC PANEL
BUN: 12 mg/dL (ref 6–23)
Chloride: 96 mEq/L (ref 96–112)
GFR: 127.95 mL/min (ref >60.00)
Potassium: 3.8 mEq/L (ref 3.5–5.1)
Sodium: 134 mEq/L — ABNORMAL LOW (ref 135–145)

## 2011-11-23 LAB — TSH: TSH: 2.78 u[IU]/mL (ref 0.35–5.50)

## 2011-11-23 NOTE — Patient Instructions (Addendum)
If you activate My Chart; the results can be released to you as soon as they populate from the lab. If you choose not to use this program; the labs have to be reviewed, copied & mailed   causing a delay in getting the results to you. Review and correct the record as indicated. Please share record with all medical staff seen.  

## 2011-11-23 NOTE — Progress Notes (Signed)
Subjective:    Patient ID: Alicia [name], female    DOB: 01/14/1946, 66 y.o.   MRN: [id number]  HPI The prehospitalization telephone message and the hospital records and labs were reviewed concerning near syncope. She was found to have significant hyponatremia ( 128) for which she received intravenous sodium chloride. Medications were not adjusted; but she discontinued Cymbalta. She had taken only one 30 mg pill.She had not been on a diuretic. Chest x-ray revealed biapical scarring with no evidence of a pulmonary lesion.   The pain is described as constant tight and cramping since 10/25/10 after carrying weights.  She also fell on the right buttock 09/03/11 while descending stairs. This does seem to increase intensity later in the afternoon after 4. When she stands she has pain which radiates  down the right leg along the lateral calf with weakness, numbness & tingling. This has not responded significantly to her present gabapentin and nortriptyline . Also natural anti-inflammatory agents and acupuncture were of no benefit. She plans laser treatments through a chiropractor.  Past medical history/family history/social history were all reviewed and updated. Pertinent data: Muscular Dystrophy monitored by Dr Sandria Manly  She was found to have a right carotid bruit by Dr Sandria Manly; Doppler revealed tortuosity without significant atherosclerosis or plaque       Review of Systems  Constitutional: no fever, chills, sweats, change in weight  Musculoskeletal:no  muscle cramps or pain; no  joint stiffness, redness, or swelling Skin:no rash, color change Neuro: no  incontinence (stool/urine) Heme:no lymphadenopathy; abnormal bruising or bleeding        Objective:   Physical Exam Gen.: Thin but  well-nourished in appearance. Alert, appropriate and cooperative throughout exam. Head: Normocephalic without obvious abnormalities Eyes: No corneal or conjunctival inflammation noted.  Neck: No deformities,  masses, or tenderness noted. Range of motion decreased. Thyroid small Lungs: Normal respiratory effort; chest expands symmetrically. Lungs are clear to auscultation without rales, wheezes, or increased work of breathing. Heart: Normal rate and rhythm. Normal S1 and S2. No gallop, click, or rub.  Grade 1/6 systolic murmur  Abdomen: Bowel sounds normal; abdomen soft and nontender. No masses, organomegaly or hernias noted.Aorta palpable ; no AAA                                                                                  Musculoskeletal/extremities:  No clubbing, cyanosis, edema, or deformity noted. Range of motion  normal .Tone & strength  normal.Joints normal. Nail health  Good. Minor knee crepitus. She is able to lie flat and sit up without help. She has discomfort in the right buttock area straight leg raising almost 90 Vascular: Carotid, radial artery, dorsalis pedis and  posterior tibial pulses are full and equal. Short ,brisk R carotid bruit present. Neurologic: Alert and oriented x3. Deep tendon reflexes symmetrical and normal.          Skin: Intact without suspicious lesions or rashes. Lymph: No cervical, axillary lymphadenopathy present. Psych: Mood and affect are normal. Normally interactive  Assessment & Plan:  #1 acute near syncopal episode in the context of significant hyponatremia. This was attributed to a single dose of low dose Cymbalta.  #2  L-5 radiculopathy, acute exacerbation on chronic process  #3 right carotid bruit due to tortuosity, not plaque.  Plan: See orders recommendations

## 2011-11-24 LAB — VITAMIN D 25 HYDROXY (VIT D DEFICIENCY, FRACTURES): Vit D, 25-Hydroxy: 79 ng/mL (ref 30–89)

## 2011-11-27 DIAGNOSIS — G129 Spinal muscular atrophy, unspecified: Secondary | ICD-10-CM | POA: Diagnosis not present

## 2011-11-27 DIAGNOSIS — M539 Dorsopathy, unspecified: Secondary | ICD-10-CM | POA: Diagnosis not present

## 2011-11-27 DIAGNOSIS — R262 Difficulty in walking, not elsewhere classified: Secondary | ICD-10-CM | POA: Diagnosis not present

## 2011-11-27 DIAGNOSIS — M62838 Other muscle spasm: Secondary | ICD-10-CM | POA: Diagnosis not present

## 2011-12-11 DIAGNOSIS — G129 Spinal muscular atrophy, unspecified: Secondary | ICD-10-CM | POA: Diagnosis not present

## 2011-12-11 DIAGNOSIS — R262 Difficulty in walking, not elsewhere classified: Secondary | ICD-10-CM | POA: Diagnosis not present

## 2011-12-11 DIAGNOSIS — M539 Dorsopathy, unspecified: Secondary | ICD-10-CM | POA: Diagnosis not present

## 2011-12-11 DIAGNOSIS — M62838 Other muscle spasm: Secondary | ICD-10-CM | POA: Diagnosis not present

## 2011-12-13 DIAGNOSIS — M62838 Other muscle spasm: Secondary | ICD-10-CM | POA: Diagnosis not present

## 2011-12-13 DIAGNOSIS — G129 Spinal muscular atrophy, unspecified: Secondary | ICD-10-CM | POA: Diagnosis not present

## 2011-12-13 DIAGNOSIS — R262 Difficulty in walking, not elsewhere classified: Secondary | ICD-10-CM | POA: Diagnosis not present

## 2011-12-13 DIAGNOSIS — M539 Dorsopathy, unspecified: Secondary | ICD-10-CM | POA: Diagnosis not present

## 2011-12-14 DIAGNOSIS — M539 Dorsopathy, unspecified: Secondary | ICD-10-CM | POA: Diagnosis not present

## 2011-12-14 DIAGNOSIS — R262 Difficulty in walking, not elsewhere classified: Secondary | ICD-10-CM | POA: Diagnosis not present

## 2011-12-14 DIAGNOSIS — G129 Spinal muscular atrophy, unspecified: Secondary | ICD-10-CM | POA: Diagnosis not present

## 2011-12-14 DIAGNOSIS — M62838 Other muscle spasm: Secondary | ICD-10-CM | POA: Diagnosis not present

## 2011-12-18 DIAGNOSIS — M62838 Other muscle spasm: Secondary | ICD-10-CM | POA: Diagnosis not present

## 2011-12-18 DIAGNOSIS — M539 Dorsopathy, unspecified: Secondary | ICD-10-CM | POA: Diagnosis not present

## 2011-12-18 DIAGNOSIS — G129 Spinal muscular atrophy, unspecified: Secondary | ICD-10-CM | POA: Diagnosis not present

## 2011-12-18 DIAGNOSIS — R262 Difficulty in walking, not elsewhere classified: Secondary | ICD-10-CM | POA: Diagnosis not present

## 2011-12-20 DIAGNOSIS — G129 Spinal muscular atrophy, unspecified: Secondary | ICD-10-CM | POA: Diagnosis not present

## 2011-12-20 DIAGNOSIS — R262 Difficulty in walking, not elsewhere classified: Secondary | ICD-10-CM | POA: Diagnosis not present

## 2011-12-20 DIAGNOSIS — M539 Dorsopathy, unspecified: Secondary | ICD-10-CM | POA: Diagnosis not present

## 2011-12-20 DIAGNOSIS — M62838 Other muscle spasm: Secondary | ICD-10-CM | POA: Diagnosis not present

## 2011-12-21 DIAGNOSIS — R262 Difficulty in walking, not elsewhere classified: Secondary | ICD-10-CM | POA: Diagnosis not present

## 2011-12-21 DIAGNOSIS — M62838 Other muscle spasm: Secondary | ICD-10-CM | POA: Diagnosis not present

## 2011-12-21 DIAGNOSIS — G129 Spinal muscular atrophy, unspecified: Secondary | ICD-10-CM | POA: Diagnosis not present

## 2011-12-21 DIAGNOSIS — M539 Dorsopathy, unspecified: Secondary | ICD-10-CM | POA: Diagnosis not present

## 2012-01-05 HISTORY — PX: COLONOSCOPY: SHX174

## 2012-01-23 ENCOUNTER — Other Ambulatory Visit: Payer: Self-pay | Admitting: Obstetrics and Gynecology

## 2012-02-19 DIAGNOSIS — M549 Dorsalgia, unspecified: Secondary | ICD-10-CM | POA: Diagnosis not present

## 2012-02-19 DIAGNOSIS — R0989 Other specified symptoms and signs involving the circulatory and respiratory systems: Secondary | ICD-10-CM | POA: Diagnosis not present

## 2012-03-19 DIAGNOSIS — R110 Nausea: Secondary | ICD-10-CM | POA: Diagnosis not present

## 2012-03-19 DIAGNOSIS — R6881 Early satiety: Secondary | ICD-10-CM | POA: Diagnosis not present

## 2012-03-19 DIAGNOSIS — R1013 Epigastric pain: Secondary | ICD-10-CM | POA: Diagnosis not present

## 2012-03-22 ENCOUNTER — Other Ambulatory Visit: Payer: Self-pay | Admitting: Gastroenterology

## 2012-03-22 DIAGNOSIS — R110 Nausea: Secondary | ICD-10-CM | POA: Diagnosis not present

## 2012-03-22 DIAGNOSIS — R1013 Epigastric pain: Secondary | ICD-10-CM | POA: Diagnosis not present

## 2012-03-22 DIAGNOSIS — K2990 Gastroduodenitis, unspecified, without bleeding: Secondary | ICD-10-CM | POA: Diagnosis not present

## 2012-03-22 DIAGNOSIS — K2970 Gastritis, unspecified, without bleeding: Secondary | ICD-10-CM | POA: Diagnosis not present

## 2012-03-22 DIAGNOSIS — R109 Unspecified abdominal pain: Secondary | ICD-10-CM

## 2012-03-22 DIAGNOSIS — R6881 Early satiety: Secondary | ICD-10-CM | POA: Diagnosis not present

## 2012-03-22 DIAGNOSIS — K449 Diaphragmatic hernia without obstruction or gangrene: Secondary | ICD-10-CM | POA: Diagnosis not present

## 2012-03-22 DIAGNOSIS — K2960 Other gastritis without bleeding: Secondary | ICD-10-CM | POA: Diagnosis not present

## 2012-03-25 ENCOUNTER — Ambulatory Visit
Admission: RE | Admit: 2012-03-25 | Discharge: 2012-03-25 | Disposition: A | Discharge status: Home / Self Care | Payer: Medicare Other | Source: Ambulatory Visit | Attending: Gastroenterology | Admitting: Gastroenterology

## 2012-03-25 ENCOUNTER — Other Ambulatory Visit: Payer: Medicare Other

## 2012-03-25 DIAGNOSIS — R1012 Left upper quadrant pain: Secondary | ICD-10-CM | POA: Diagnosis not present

## 2012-03-25 DIAGNOSIS — R109 Unspecified abdominal pain: Secondary | ICD-10-CM

## 2012-05-16 ENCOUNTER — Other Ambulatory Visit: Payer: Self-pay | Admitting: Dermatology

## 2012-05-16 DIAGNOSIS — C44611 Basal cell carcinoma of skin of unspecified upper limb, including shoulder: Secondary | ICD-10-CM | POA: Diagnosis not present

## 2012-05-16 DIAGNOSIS — L719 Rosacea, unspecified: Secondary | ICD-10-CM | POA: Diagnosis not present

## 2012-05-16 DIAGNOSIS — L570 Actinic keratosis: Secondary | ICD-10-CM | POA: Diagnosis not present

## 2012-05-16 DIAGNOSIS — Z85828 Personal history of other malignant neoplasm of skin: Secondary | ICD-10-CM | POA: Diagnosis not present

## 2012-05-16 DIAGNOSIS — D485 Neoplasm of uncertain behavior of skin: Secondary | ICD-10-CM | POA: Diagnosis not present

## 2012-05-16 DIAGNOSIS — L821 Other seborrheic keratosis: Secondary | ICD-10-CM | POA: Diagnosis not present

## 2012-06-03 ENCOUNTER — Encounter: Payer: Self-pay | Admitting: Internal Medicine

## 2012-06-03 ENCOUNTER — Ambulatory Visit (INDEPENDENT_AMBULATORY_CARE_PROVIDER_SITE_OTHER): Payer: Medicare Other | Admitting: Internal Medicine

## 2012-06-03 VITALS — BP 126/80 | HR 91 | Wt 120.0 lb

## 2012-06-03 DIAGNOSIS — M5416 Radiculopathy, lumbar region: Secondary | ICD-10-CM

## 2012-06-03 DIAGNOSIS — G7100 Muscular dystrophy, unspecified: Secondary | ICD-10-CM

## 2012-06-03 DIAGNOSIS — IMO0002 Reserved for concepts with insufficient information to code with codable children: Secondary | ICD-10-CM

## 2012-06-03 DIAGNOSIS — M4120 Other idiopathic scoliosis, site unspecified: Secondary | ICD-10-CM

## 2012-06-03 DIAGNOSIS — G7109 Other specified muscular dystrophies: Secondary | ICD-10-CM

## 2012-06-03 NOTE — Patient Instructions (Addendum)

## 2012-06-03 NOTE — Progress Notes (Signed)
  Subjective:    Patient ID: Alicia [name], female    DOB: [birth year], 67 y.o.   MRN: [id number]  HPI History related to chronic back issues reviewed  & Problem List updated.Copies of Dr Areta Haber evaluations copied for scanning.   She is now having pain for approximately 20 months despite multiple interventions listed in the problem list. Greatest pain/cramping is in the right buttock and hip. Subjectively this has been worse since she had an intensive neurologic exam 02/19/12.  Her chiropractor has recommended repeating the MRI of the lumbar spine as a prelude to possible neurosurgical referral.      Review of Systems Her [employer] has retired; she is considering [facility] [place]     Objective:   Physical Exam   She appears adequately nourished although thin. She appears younger than stated age  Strength and tone in lower extremities is normal.  Deep tendon reflexes are decreased asymmetrically at the knees. The right is 1/2+ in the left is absent.  She is able to complete tiptoe and heel walking. Her gait is slightly broad and deliberate.        Assessment & Plan:

## 2012-06-03 NOTE — Assessment & Plan Note (Signed)
I have recommended neurologic assessment of the radicular pain by Neurologist because of the comorbidities of scoliosis and muscular dystrophy. She's requesting a second opinion from Dr. Larose Kells in [place].

## 2012-07-04 ENCOUNTER — Ambulatory Visit (INDEPENDENT_AMBULATORY_CARE_PROVIDER_SITE_OTHER): Payer: Medicare Other | Admitting: Internal Medicine

## 2012-07-04 ENCOUNTER — Emergency Department (HOSPITAL_COMMUNITY): Payer: Medicare Other

## 2012-07-04 ENCOUNTER — Emergency Department (HOSPITAL_COMMUNITY)
Admission: EM | Admit: 2012-07-04 | Discharge: 2012-07-04 | Disposition: A | Discharge status: Home / Self Care | Payer: Medicare Other | Attending: Emergency Medicine | Admitting: Emergency Medicine

## 2012-07-04 ENCOUNTER — Encounter: Payer: Self-pay | Admitting: Internal Medicine

## 2012-07-04 VITALS — BP 130/72 | HR 101 | Temp 97.9°F

## 2012-07-04 DIAGNOSIS — R002 Palpitations: Secondary | ICD-10-CM

## 2012-07-04 DIAGNOSIS — G43909 Migraine, unspecified, not intractable, without status migrainosus: Secondary | ICD-10-CM | POA: Insufficient documentation

## 2012-07-04 DIAGNOSIS — R51 Headache: Secondary | ICD-10-CM | POA: Diagnosis not present

## 2012-07-04 DIAGNOSIS — Z8742 Personal history of other diseases of the female genital tract: Secondary | ICD-10-CM | POA: Diagnosis not present

## 2012-07-04 DIAGNOSIS — R0789 Other chest pain: Secondary | ICD-10-CM | POA: Diagnosis not present

## 2012-07-04 DIAGNOSIS — Z8739 Personal history of other diseases of the musculoskeletal system and connective tissue: Secondary | ICD-10-CM | POA: Diagnosis not present

## 2012-07-04 DIAGNOSIS — G609 Hereditary and idiopathic neuropathy, unspecified: Secondary | ICD-10-CM | POA: Diagnosis not present

## 2012-07-04 DIAGNOSIS — Z85828 Personal history of other malignant neoplasm of skin: Secondary | ICD-10-CM | POA: Insufficient documentation

## 2012-07-04 DIAGNOSIS — R079 Chest pain, unspecified: Secondary | ICD-10-CM

## 2012-07-04 DIAGNOSIS — Z8719 Personal history of other diseases of the digestive system: Secondary | ICD-10-CM | POA: Insufficient documentation

## 2012-07-04 DIAGNOSIS — E871 Hypo-osmolality and hyponatremia: Secondary | ICD-10-CM | POA: Diagnosis present

## 2012-07-04 LAB — POCT I-STAT, CHEM 8
BUN: 10 mg/dL (ref 6–23)
Calcium, Ion: 1.16 mmol/L (ref 1.13–1.30)
Calcium, Ion: 1.17 mmol/L (ref 1.13–1.30)
Chloride: 98 mEq/L (ref 96–112)
Creatinine, Ser: 0.5 mg/dL (ref 0.50–1.10)
Glucose, Bld: 97 mg/dL (ref 70–99)
Glucose, Bld: 85 mg/dL (ref 70–99)
Potassium: 4.7 mEq/L (ref 3.5–5.1)
Potassium: 4.3 mEq/L (ref 3.5–5.1)
Sodium: 132 mEq/L — ABNORMAL LOW (ref 135–145)
TCO2: 26 mmol/L (ref 0–100)

## 2012-07-04 LAB — COMPREHENSIVE METABOLIC PANEL
AST: 25 U/L (ref 0–37)
Albumin: 4.3 g/dL (ref 3.5–5.2)
CO2: 27 mEq/L (ref 19–32)
Calcium: 10.3 mg/dL (ref 8.4–10.5)
Creatinine, Ser: 0.49 mg/dL — ABNORMAL LOW (ref 0.50–1.10)
GFR calc non Af Amer: >90 mL/min (ref >90)

## 2012-07-04 LAB — URINALYSIS, ROUTINE W REFLEX MICROSCOPIC
Hgb urine dipstick: NEGATIVE
Leukocytes, UA: NEGATIVE
Specific Gravity, Urine: 1.01 (ref 1.005–1.030)
Urobilinogen, UA: 0.2 mg/dL (ref 0.0–1.0)

## 2012-07-04 LAB — CBC
MCH: 30.1 pg (ref 26.0–34.0)
MCV: 84.5 fL (ref 78.0–100.0)
Platelets: 283 10*3/uL (ref 150–400)
RDW: 13.7 % (ref 11.5–15.5)

## 2012-07-04 MED ORDER — ATENOLOL 25 MG PO TABS: 25.0000 mg | ORAL_TABLET | Freq: Every day | ORAL | Status: DC

## 2012-07-04 MED ORDER — ASPIRIN 81 MG PO CHEW
324.0000 mg | CHEWABLE_TABLET | Freq: Once | ORAL | Status: AC
  Administered 2012-07-04: 243 mg via ORAL
  Filled 2012-07-04: qty 4

## 2012-07-04 MED ORDER — SODIUM CHLORIDE 0.9 % IV SOLN
1000.0000 mL | INTRAVENOUS | Status: DC
  Administered 2012-07-04: 1000 mL via INTRAVENOUS

## 2012-07-04 MED ORDER — NITROGLYCERIN 0.4 MG SL SUBL
0.4000 mg | SUBLINGUAL_TABLET | SUBLINGUAL | Status: DC | PRN
  Administered 2012-07-04: 0.4 mg via SUBLINGUAL
  Filled 2012-07-04: qty 25

## 2012-07-04 MED ORDER — ASPIRIN 81 MG PO TABS: 81.0000 mg | ORAL_TABLET | Freq: Every day | ORAL | Status: DC

## 2012-07-04 NOTE — ED Notes (Addendum)
Pt states that she has been having chest pressure and dizziness for 3 weeks. Pt states that she has felt like her heart "flip flopped". Pt has worn heart monitor approx 3 years ago for same feeling. Pt was seen by dr Patty Sermons at that time. Pt has had worsening dizziness/lightheaded/weakness/nausea since yesterday. Pt states that when she has the chest pressure she feels the need to cough or deep breath.

## 2012-07-04 NOTE — Progress Notes (Signed)
  Subjective:    Patient ID: Alicia [name], female    DOB: 1945-07-19, 67 y.o.   MRN: [id number]  HPI Acute visit, patient made appointment for weakness, when she was at the  waiting room, my nurse noted her to be on some respiratory distress. The patient reports for the last 3 weeks has noted some extra heartbeats, symptoms definitely increased more In the last week and today she had even more symptoms: palpitations, associated with shortness or breath, lightheadedness, feeling faintly. She had similar episodes 3 years ago, she has been nearly asymptomatic until recently.. She used to see Dr. Patty Sermons, was prescribed atenolol.  Past Medical History  Diagnosis Date  . Endometriosis   . 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 [facility]     [facility]  . Peripheral neuropathy     Dr Sandria Manly  . Migraine   . Diverticulosis    Past Surgical History  Procedure Laterality Date  . Appendectomy      high school; low grade appendiceal cancer  . Abdominal hysterectomy      for Endometriosis  . Tubal ligation    . Tonsillectomy    . Colonoscopy    . Esi       X 3 in 2003 & 02/17/2011 for L4-S1 symptoms       Review of Systems Denies fever or chills. When asked admits to some chest pressure from time to time associated with cough. No  nausea, vomiting, diarrhea or blood in the stools. No headaches, slurred speech or diplopia. No recent airplane trips or leg swelling. No loss of consciousness. Not taking any new medications.     Objective:   Physical Exam BP 130/72  Pulse 101  Temp(Src) 97.9 F (36.6 C) (Oral)  SpO2 99% General -- alert, well-developed, Vital signs is stable . Neck --no thyromegaly ,  no LADs HEENT -- No jaundice or pale Lungs -- normal respiratory effort, no intercostal retractions, no accessory muscle use, and normal breath sounds.   Heart-- normal rate, regular rhythm, no murmur, and no gallop.   Abdomen--soft, non-tender, no distention, no masses, no  HSM, no guarding, and no rigidity.   Extremities-- no pretibial edema bilaterally  Neurologic-- alert & oriented X3 Speech, gait and motor are intact. Psych-- Cognition and judgment appear intact. Alert and cooperative with normal attention span and concentration.  not anxious appearing and not depressed appearing.    During my exam, she felt short of breath, have to recline in the examining table, she hyperventilated a little, I did not noted actual emotional distress. Pulse appeared regular.      Assessment & Plan:  67 year old lady presents with a three-week history of palpitations, getting gradually worse. Today, symptoms were associated with near-syncope, shortness of breath. Etiology unclear. EKG seemsat baseline. Does not seem to be any emotional distress. At this point I think she needs further eval, I referred her to the ER, most likely she will need cardiac enzymes, a d-dimer, stay on the monitor for few hours to be sure we are not missing a tachyarrhythmia. I discussed the case with the charge nurse.

## 2012-07-04 NOTE — Consult Note (Signed)
CARDIOLOGY CONSULT NOTE   Patient ID: Alicia [name] MRN: [id number] DOB/AGE: Aug 09, 1945 67 y.o.  Admit date: 07/04/2012  Primary Physician   Marga Melnick, MD Primary Cardiologist   TB Reason for Consultation   Palpitations  [id number] Alicia [name] is a 67 y.o. female with no history of CAD. Pt has been having episodes of palpitations followed by tachycardia and heart pounding for several weeks. These episodes have been lasting longer and are now lasting most of the day. They will start in the morning but are intermittent. By afternoon, they are continuous and will last until she goes to bed. She will sometimes feel her heart pounding in her ears and get a headache. She has taken her blood pressure and heart rate at times during these episodes. Her blood pressure will be in the 150s systolic with a heart rate less than 100. She has not tried any medications specifically for this. These episodes are associated with chest pressure that reaches a 10/10. It is associated with shortness of breath. She will cough occasionally in the cough will briefly relieved the chest pressure but it comes right back within a few seconds. These episodes have also been lasting all afternoon until she goes to bed. She does not have them upon waking. They will start with exertion such as preparing food or walking around. She also feels the episodes are brought on or made worse when she has the chronic lower back pain that radiates down her legs which she has from nerve problems in her lower back. She denies GI symptoms or melena. Today, the symptoms were associated with generalized which became much more severe. She had a quivering feeling and thought she was coming down with something. She saw Dr. Drue Novel for Dr. Alwyn Ren today who recommended going to the emergency room for further evaluation. In the emergency room, she is in sinus rhythm and has occasional PVCs. She feels the PVCs but is not currently having any chest  pressure, shortness of breath or palpitations. She has a history of palpitations and wore and event monitor in 2011. Those records are not currently available but the patient states she was told she had one brief episode of some atrial rhythm problem but no further treatment was needed.   Past Medical History  Diagnosis Date  . Endometriosis   . 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 [facility]     [facility]  . Peripheral neuropathy     Dr Sandria Manly  . Migraine   . Diverticulosis      Past Surgical History  Procedure Laterality Date  . Appendectomy      high school; low grade appendiceal cancer  . Abdominal hysterectomy      for Endometriosis  . Tubal ligation    . Tonsillectomy    . Colonoscopy    . Esi       X 3 in 2003 & 02/17/2011 for L4-S1 symptoms    Allergies  Allergen Reactions  . Sulfonamide Derivatives Itching and Rash  . Alendronate Sodium Nausea And Vomiting and Other (See Comments)    "tears up my stomach." and lot of other stomach problems   . Demerol Nausea And Vomiting  . Nsaids Nausea Only and Other (See Comments)    "tears up my stomach", causes heartburn, and lot of other stomach problems  . Risedronate Sodium Nausea And Vomiting and Other (See Comments)    "tears up her stomach" and causes a lot of other  stomach issues  . Rofecoxib Nausea And Vomiting and Other (See Comments)    "Ate up my stomach.  Doctor told me it might have been eating a hole in my stomach." jkl    I have reviewed the patient's current medications   . sodium chloride 1,000 mL (07/04/12 1508)   nitroGLYCERIN  Medication Sig  AMBULATORY NON FORMULARY MEDICATION Take 1,000 mg by mouth 3 (three) times a week. Vit D oil: 3 drops once daily (1000mg  per drop)  Alicia Complex-C (Alicia-COMPLEX WITH VITAMIN C) tablet Take 1 tablet by mouth 3 (three) times daily.   butalbital-acetaminophen-caffeine (FIORICET, ESGIC) 50-325-40 MG per tablet Take 1 tablet by mouth every 6 (six) hours as needed for  pain.   Calcium-Magnesium 250-125 MG TABS Take 1 tablet by mouth 3 (three) times daily.  Carboxymethylcellul-Glycerin (REFRESH OPTIVE OP) Apply 1 drop to eye 2 (two) times daily.  FIBER PO Take 1 tablet by mouth 3 (three) times daily.  gabapentin (NEURONTIN) 100 MG capsule Take 200-300 mg by mouth at bedtime. At bedtime only  GUAIFENESIN PO Take 400 mg by mouth at bedtime.  LIVER EXTRACT PO Take 1 tablet by mouth 3 (three) times daily.  Lutein 20 MG TABS Take 1 tablet by mouth daily.  methocarbamol (ROBAXIN) 750 MG tablet Take 750 mg by mouth 4 (four) times daily.    Multiple Vitamins-Minerals (ICAPS PO) Take 1 capsule by mouth 2 (two) times daily.  Omega-3 Fatty Acids (OMEGA-3 EPA FISH OIL PO) Take 300-400 mg by mouth 3 (three) times daily. TUNA OIL  PRESCRIPTION MEDICATION Apply 1 application topically 2 (two) times daily. Rosacea medication  Pyridoxine HCl (Alicia-6 PO) Take 1 tablet by mouth 3 (three) times daily.  rizatriptan (MAXALT-MLT) 10 MG disintegrating tablet Take 10 mg by mouth as needed. May repeat in 2 hours if needed for migraines.   History   Social History  . Marital Status: Married    Spouse Name: Alicia [name]    Number of Children: Alicia [name]  . Years of Education: Alicia [name]   Occupational History  . Not on file.   Social History Main Topics  . Smoking status: Never Smoker   . Smokeless tobacco: Not on file  . Alcohol Use: 1.2 oz/week    2 Glasses of wine per week  . Drug Use: No  . Sexually Active: Not on file   Other Topics Concern  . Not on file   Social History Narrative  .  Pt lives with husband.     Family History  Problem Relation Age of Onset  . Alcohol abuse Mother   . Hypertension Mother   . Diabetes Mother   . Stroke Mother   . Alzheimer's disease Father   . Cancer Maternal Aunt     pancreatic    ROS:  Full 14 point review of systems complete and found to be negative unless listed above.  Physical Exam: Blood pressure 145/68, pulse 89, temperature 98.3 F  (36.8 C), temperature source Oral, resp. rate 17, height 5\' 6"  (1.676 m), weight 119 lb (53.978 kg), SpO2 99.00%.  General: Well developed, well nourished, female in no acute distress Head: Eyes PERRLA, No xanthomas.   Normocephalic and atraumatic, oropharynx without edema or exudate. Dentition: Good Lungs: CTA bilaterally Heart: HRRR S1 S2, no rub/gallop, 2/6 murmur. pulses are 2+ all 4 extrem.   Neck: Bilateral carotid bruits. No lymphadenopathy.  JVD not elevated. Abdomen: Bowel sounds present, abdomen soft and non-tender without masses or hernias noted. Msk:  No spine  or cva tenderness. No weakness, no joint deformities or effusions. Extremities: No clubbing or cyanosis. No edema.  Neuro: Alert and oriented X 3. No focal deficits noted. Psych:  Good affect, responds appropriately Skin: No rashes or lesions noted.  Labs:   Lab Results  Component Value Date   WBC 6.1 07/04/2012   HGB 12.9 07/04/2012   HCT 38.0 07/04/2012   MCV 84.5 07/04/2012   PLT 283 07/04/2012   No results found for this basename: INR,  in the last 72 hours  Recent Labs Lab 07/04/12 1313  07/04/12 1611  NA 131*  < > 131*  K 4.7  < > 4.3  CL 94*  < > 98  CO2 27  --   --   BUN 10  < > 8  CREATININE 0.49*  < > 0.50  CALCIUM 10.3  --   --   PROT 7.5  --   --   BILITOT 0.3  --   --   ALKPHOS 55  --   --   ALT 14  --   --   AST 25  --   --   GLUCOSE 96  < > 85  < > = values in this interval not displayed. No results found for this basename: mg   No results found for this basename: CKTOTAL, CKMB, TROPONINI,  in the last 72 hours  Recent Labs  07/04/12 1327 07/04/12 1609  TROPIPOC 0.01 0.01   ECG:  04-Jul-2012 12:51:06  SINUS RHYTHM ~ normal P axis, V-rate 50- 99 BIATRIAL ABNORMALITIES ~ P>38mS,<-0.15mV V1 &>0.50mV 2 lds BORDERLINE INTRAVENTRICULAR CONDUCTION DELAY ~ QRSd >160mS RSR' IN V1 OR V2, PROBABLY NORMAL VARIANT ~ small R' only Vent. rate 88 BPM PR interval 188 ms QRS duration 106 ms QT/QTc  372/450 ms P-R-T axes 80 80 74  Radiology:  Dg Chest 2 View 07/04/2012  *RADIOLOGY REPORT*  Clinical Data: Chest pain  CHEST - 2 VIEW  Comparison: 02/09/2011  Findings: Cardiomediastinal silhouette is stable.  Hyperinflation again noted.  Dextroscoliosis lumbar spine.  Left breast implant again noted.  No acute infiltrate or pulmonary edema. Stable bilateral apical scarring.  IMPRESSION:  No active disease.  Hyperinflation.   Original Report Authenticated By: Natasha Mead, M.D.    ASSESSMENT AND PLAN:   The patient was seen today by Dr Shirlee Latch, the patient evaluated and the data reviewed.     Heart palpitations - PVCs seen on telemetry but are rare. No other arrhythmia seen. Pt has been having hours of symptoms daily without ECG changes or enzyme elevation. She can start taking atenolol 25 mg daily as well as a baby aspirin. She will need early followup with Dr. Patty Sermons next week and can pick up an event monitor at that time. We will order a TSH but she does not need to wait for the results as it has been normal in the past.     Hyponatremia - sodium has been supplemented by IV per the emergency room staff. She should followup with Dr. Alwyn Ren. Patient's symptoms are much improved.   SignedTheodore Demark, PA-C 07/04/2012 5:33 PM Beeper [phone]  Co-Sign MD  Patient seen with PA, agree with the above note.  Her symptoms seem to be due to palpitations by the description.  She had PVCs in the ER with symptoms.  She has had PVCs in the past that resolved with atenolol but she is no longer on atenolol.  ECG, cardiac enzymes ok.   - start atenolol  25 mg daily and ASA 81 mg daily. - She will need a 48 hour holter.  - Followup soon with Dr. Patty Sermons.   Marca Ancona 07/04/2012 6:35 PM

## 2012-07-04 NOTE — Patient Instructions (Addendum)
Please go to the [facility] emergency room now, I already talked to the charge nurse, she knows you are coming for further evaluation. They have access to all the records. If you are waiting to be seen and you have more symptoms, let them know immediately.

## 2012-07-04 NOTE — ED Provider Notes (Signed)
Alicia [name] is a 67 y.o. female symptoms of palpitation for several weeks. He seemed to be worsening. She has had them previously and been evaluated with a Holter monitor. No persistent chest pain.  Exam alert, calm, cooperative. Her monitor reveals normal sinus rhythm with occasional unifocal PVC  Normal effort. Neurologic nonfocal   Assessment: nonspecific palpitation.Doubt ACS , PE, pneumonia. No evidence for significant metabolic instability.  Plan: Cardiology evaluation in the ED, To help arrange disposition,   Medical screening examination/treatment/procedure(s) were conducted as a shared visit with non-physician practitioner(s) and myself.  I personally evaluated the patient during the encounter      Flint Melter, MD 07/05/12 [phone]

## 2012-07-04 NOTE — ED Provider Notes (Signed)
History     CSN: [id number]  Arrival date & time 07/04/12  1238   First MD Initiated Contact with Patient 07/04/12 1257      Chief Complaint  Patient presents with  . Chest Pain    (Consider location/radiation/quality/duration/timing/severity/associated sxs/prior treatment) HPI  Alicia [name] is a 67 y.o. female palpitations accompanied by diffuse anterior, nonradiating, chest pressure, lightheaded sensation, cold shivers, and queasy sensation for the last 3-1/2 weeks. Patient has had 12 episodes, but they have become significantly more frequent in the last 3 days. Episodes last for approximately 1 minute. Patient states she has been under more stress recently to due to family wedding. She has not had any caffeine in the last week except for half a cup of tea this morning. She describes her palpitation as "flip-flopping" of the heart described as irregular and heavy beats. Patient denies fever, cough, nausea, vomiting shortness of breath, leg swelling, orthopnea or PND, recent immobilizations, exogenous estrogen, leg swelling. Patient had similar episode in the past and was evaluated by Dr. Patty Sermons, she wore a Holter monitor in January of 2014 and no malignant rhythms were observed, as per the patient.     Past Medical History  Diagnosis Date  . Endometriosis   . 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 [facility]     [facility]  . Peripheral neuropathy     Dr Sandria Manly  . Migraine   . Diverticulosis     Past Surgical History  Procedure Laterality Date  . Appendectomy      high school; low grade appendiceal cancer  . Abdominal hysterectomy      for Endometriosis  . Tubal ligation    . Tonsillectomy    . Colonoscopy    . Esi       X 3 in 2003 & 02/17/2011 for L4-S1 symptoms    Family History  Problem Relation Age of Onset  . Alcohol abuse Mother   . Hypertension Mother   . Diabetes Mother   . Stroke Mother   . Alzheimer's disease Father   . Cancer Maternal Aunt    pancreatic    History  Substance Use Topics  . Smoking status: Never Smoker   . Smokeless tobacco: Not on file  . Alcohol Use: 1.2 oz/week    2 Glasses of wine per week    OB History   Grav Para Term Preterm Abortions TAB SAB Ect Mult Living                  Review of Systems  Constitutional: Negative for fever.  Respiratory: Negative for shortness of breath.   Cardiovascular: Positive for palpitations. Negative for chest pain.  Gastrointestinal: Negative for nausea, vomiting, abdominal pain and diarrhea.  All other systems reviewed and are negative.    Allergies  Sulfonamide derivatives; Alendronate sodium; Demerol; Nsaids; Risedronate sodium; and Rofecoxib  Home Medications   Current Outpatient Rx  Name  Route  Sig  Dispense  Refill  . AMBULATORY NON FORMULARY MEDICATION      Vit D oil: 3 drops once daily (1000mg  per drop)         . B Complex-C (B-COMPLEX WITH VITAMIN C) tablet   Oral   Take 1 tablet by mouth daily.         . butalbital-acetaminophen-caffeine (FIORICET, ESGIC) 50-325-40 MG per tablet   Oral   Take 1 tablet by mouth every 6 (six) hours as needed. For pain.         Marland Kitchen  CALCIUM & MAGNESIUM CARBONATES PO   Oral   Take 2-3 tablets by mouth 2 (two) times daily.         . cetirizine (ZYRTEC) 10 MG tablet   Oral   Take 10 mg by mouth daily as needed. For allergies.         Marland Kitchen gabapentin (NEURONTIN) 100 MG capsule   Oral   Take 300 mg by mouth. 3 by mouth at bedtime only         . Lutein 20 MG TABS   Oral   Take 1 tablet by mouth daily.         . methocarbamol (ROBAXIN) 750 MG tablet   Oral   Take 750 mg by mouth 4 (four) times daily.           . Multiple Vitamin (MULTIVITAMIN WITH MINERALS) TABS   Oral   Take 1 tablet by mouth 2 (two) times daily.         . Multiple Vitamins-Minerals (ICAPS PO)   Oral   Take 1 capsule by mouth 2 (two) times daily.         . OMEGA 3 1000 MG CAPS   Oral   Take by mouth 3 (three)  times daily.         . rizatriptan (MAXALT-MLT) 10 MG disintegrating tablet   Oral   Take 10 mg by mouth as needed. May repeat in 2 hours if needed for migraines.           BP 161/95  Pulse 78  Temp(Src) 98.3 F (36.8 C) (Oral)  Resp 15  SpO2 100%  Physical Exam  Nursing note and vitals reviewed. Constitutional: She is oriented to person, place, and time. She appears well-developed and well-nourished. No distress.  HENT:  Head: Normocephalic and atraumatic.  Mouth/Throat: Oropharynx is clear and moist.  Eyes: Conjunctivae and EOM are normal. Pupils are equal, round, and reactive to light.  Neck: Normal range of motion. No JVD present.  Cardiovascular: Normal rate, regular rhythm, normal heart sounds and intact distal pulses.   Pulmonary/Chest: Effort normal and breath sounds normal. No stridor. No respiratory distress. She has no wheezes. She has no rales. She exhibits no tenderness.  Abdominal: Soft. She exhibits no distension and no mass. There is no tenderness. There is no rebound and no guarding.  Musculoskeletal: Normal range of motion. She exhibits no edema.  Neurological: She is alert and oriented to person, place, and time.  Psychiatric: She has a normal mood and affect.    ED Course  Procedures (including critical care time)  Labs Reviewed  COMPREHENSIVE METABOLIC PANEL - Abnormal; Notable for the following:    Sodium 131 (*)    Chloride 94 (*)    Creatinine, Ser 0.49 (*)    All other components within normal limits  POCT I-STAT, CHEM 8 - Abnormal; Notable for the following:    Sodium 132 (*)    All other components within normal limits  POCT I-STAT, CHEM 8 - Abnormal; Notable for the following:    Sodium 131 (*)    All other components within normal limits  CBC  URINALYSIS, ROUTINE W REFLEX MICROSCOPIC  POCT I-STAT TROPONIN I  POCT I-STAT TROPONIN I   Dg Chest 2 View  07/04/2012  *RADIOLOGY REPORT*  Clinical Data: Chest pain  CHEST - 2 VIEW   Comparison: 02/09/2011  Findings: Cardiomediastinal silhouette is stable.  Hyperinflation again noted.  Dextroscoliosis lumbar spine.  Left breast implant again noted.  No acute infiltrate or pulmonary edema. Stable bilateral apical scarring.  IMPRESSION:  No active disease.  Hyperinflation.   Original Report Authenticated By: Natasha Mead, M.D.      Date: 07/04/2012  Rate: 88  Rhythm: normal sinus rhythm  QRS Axis: normal  Intervals: normal  ST/T Wave abnormalities: nonspecific ST/T changes  Conduction Disutrbances:none  Narrative Interpretation:   Old EKG Reviewed: unchanged    1. Palpitations       MDM   Alicia [name] is a 67 y.o. female palpitation sensation of chest pressure for the last 3 weeks worsening over the course of the last several days. EKG shows normal sinus rhythm with no ischemia however cardiac monitoring shows occasional PVCs.  Has mild hyponatremia of 132 we'll give her IV fluids.  [facility]: Patient will be discharged home with prescription for atenolol was called in to her local pharmacy, patient is also advised to start a daily low-dose aspirin and visit with Dr.  Patty Sermons will be expedited.    Filed Vitals:   07/04/12 1330 07/04/12 1344 07/04/12 1515 07/04/12 1530  BP: 146/70  137/65 141/66  Pulse: 71  89 76  Temp:      TempSrc:      Resp: 17  19 17   Height:  5\' 6"  (1.676 m)    Weight:  119 lb (53.978 kg)    SpO2: 100%  99% 100%         Wynetta Emery, PA-C 07/04/12 1749

## 2012-07-06 ENCOUNTER — Telehealth: Payer: Self-pay | Admitting: Cardiology

## 2012-07-06 NOTE — Telephone Encounter (Signed)
Ms. Froio called with concern regarding her BP and pulse. She was evaluated by Dr. Shirlee Latch in the [facility] ED on 07/04/2012 for symptomatic PVCs. Atenolol was added to her regimen, 25 mg once daily. She reports her palpitations were worse yesterday but have improved today. She checked her BP and pulse this AM and reports "the bottom number was a little low" for her, BP 115/54 pulse 60. She has had dizziness in the past with low BP and pulse so wanted to know if she should take atenolol. I advised her to check her BP and pulse now and if her systolic BP >110 and pulse >60 she can take atenolol. She can also reduce her dose to 12.5 mg if needed. She has a follow-up appt with Norma Fredrickson, NP on Monday, 07/08/2012. She was instructed to keep this appointment. She expressed verbal understanding and agrees with this plan of care. She will call us back if she has any other questions or concerns.

## 2012-07-07 ENCOUNTER — Telehealth: Payer: Self-pay | Admitting: Cardiology

## 2012-07-07 NOTE — Telephone Encounter (Signed)
Alicia [name] called again today with concern regarding low BP readings. She reports her BP was normal today until 3 PM. She noticed dizziness and when she took her BP it was 96/47 and her pulse 48. She denies any other symptoms, specifically CP, SOB, palpitations or syncope. Her symptoms lasted approximately 30 minutes and have now resolved. She feels like her usual self again. Her BP and pulse have improved. She feels she also may be dehydrated. She wants to know what to do with her atenolol. I instructed her to stop atenolol. She has a follow-up appointment tomorrow morning in our office and I instructed her to keep that appointment. She will call us back if she has any further questions or concerns. Alicia [name] expressed verbal understanding and agrees with this plan of care.

## 2012-07-08 ENCOUNTER — Encounter: Payer: Medicare Other | Admitting: Nurse Practitioner

## 2012-07-08 ENCOUNTER — Telehealth: Payer: Self-pay | Admitting: Cardiology

## 2012-07-08 ENCOUNTER — Other Ambulatory Visit: Payer: Self-pay | Admitting: Emergency Medicine

## 2012-07-08 NOTE — Telephone Encounter (Signed)
A user error has taken place: encounter opened in error, closed for administrative reasons.

## 2012-07-08 NOTE — Telephone Encounter (Signed)
Patient seeing  Dr. Patty Sermons in am

## 2012-07-08 NOTE — Telephone Encounter (Signed)
New Prob      Pts apt got rescheduled and she is worried about it being so far out. Would like to speak to nurse.

## 2012-07-09 ENCOUNTER — Encounter (INDEPENDENT_AMBULATORY_CARE_PROVIDER_SITE_OTHER): Payer: Medicare Other

## 2012-07-09 ENCOUNTER — Encounter: Payer: Self-pay | Admitting: Cardiology

## 2012-07-09 ENCOUNTER — Ambulatory Visit (INDEPENDENT_AMBULATORY_CARE_PROVIDER_SITE_OTHER): Payer: Medicare Other | Admitting: Cardiology

## 2012-07-09 VITALS — BP 122/68 | HR 66 | Ht 66.5 in | Wt 118.4 lb

## 2012-07-09 DIAGNOSIS — E871 Hypo-osmolality and hyponatremia: Secondary | ICD-10-CM | POA: Diagnosis not present

## 2012-07-09 DIAGNOSIS — R002 Palpitations: Secondary | ICD-10-CM

## 2012-07-09 DIAGNOSIS — R42 Dizziness and giddiness: Secondary | ICD-10-CM

## 2012-07-09 LAB — BASIC METABOLIC PANEL
BUN: 13 mg/dL (ref 6–23)
GFR: 114.64 mL/min (ref >60.00)
Glucose, Bld: 74 mg/dL (ref 70–99)
Potassium: 3.9 mEq/L (ref 3.5–5.1)

## 2012-07-09 NOTE — Assessment & Plan Note (Addendum)
For evaluation of her heart palpitations she is wearing a 48 hour monitor which was placed today.  We will also have her get an update on her two-dimensional echocardiogram.  She is also very concerned about her carotid arteries and we will update her carotid artery duplex scan.

## 2012-07-09 NOTE — Progress Notes (Signed)
Alicia [name] Date of Birth:  08/18/1945 [facility] [address] [place], [place]  [address] [phone]         Fax   [phone]  History of Present Illness:  This pleasant 67 year old woman is seen for post hospital followup office visit.  She was recently seen in the emergency room because of palpitations and was noted to have hyponatremia.  She has a past history of hyponatremia.  She states that when she gets hyponatremic she feels weak and has increased palpitations.  She has a remote history of palpitations and  We had seen her about 3 years ago and worked up at that time with an echocardiogram on 03/25/09 which showed normal LV systolic function.  She does not have any history of ischemic heart disease.  She has a past history of neurologic disease and has previously been followed by Dr. Avie Echevaria.  She has been told that she has tortuous carotid arteries but no blockage.  She has previously been given empiric beta blocker for her symptomatic premature atrial beats but has not taken it on a consistent basis because of perceived side effects.  Current Outpatient Prescriptions  Medication Sig Dispense Refill  . AMBULATORY NON FORMULARY MEDICATION Take 1,000 mg by mouth 3 (three) times a week. Vit D oil: 3 drops once daily (1000mg  per drop)      . aspirin 81 MG tablet Take 1 tablet (81 mg total) by mouth daily.  30 tablet    . B Complex-C (B-COMPLEX WITH VITAMIN C) tablet Take 1 tablet by mouth 3 (three) times daily.       . butalbital-acetaminophen-caffeine (FIORICET, ESGIC) 50-325-40 MG per tablet Take 1 tablet by mouth every 6 (six) hours as needed for pain.       . Calcium-Magnesium 250-125 MG TABS Take 1 tablet by mouth 3 (three) times daily.      . Carboxymethylcellul-Glycerin (REFRESH OPTIVE OP) Apply 1 drop to eye 2 (two) times daily.      Marland Kitchen FIBER PO Take 1 tablet by mouth 3 (three) times daily.      Marland Kitchen gabapentin (NEURONTIN) 100 MG capsule Take 200-300  mg by mouth at bedtime. At bedtime only      . GUAIFENESIN PO Take 400 mg by mouth at bedtime.      Marland Kitchen LIVER EXTRACT PO Take 1 tablet by mouth 3 (three) times daily.      . Lutein 20 MG TABS Take 1 tablet by mouth daily.      . methocarbamol (ROBAXIN) 750 MG tablet Take 750 mg by mouth 4 (four) times daily.        . Multiple Vitamins-Minerals (ICAPS PO) Take 1 capsule by mouth 2 (two) times daily.      . Omega-3 Fatty Acids (OMEGA-3 EPA FISH OIL PO) Take 300-400 mg by mouth 3 (three) times daily. TUNA OIL      . PRESCRIPTION MEDICATION Apply 1 application topically 2 (two) times daily. Rosacea medication      . Pyridoxine HCl (B-6 PO) Take 1 tablet by mouth 3 (three) times daily.      . rizatriptan (MAXALT-MLT) 10 MG disintegrating tablet Take 10 mg by mouth as needed. May repeat in 2 hours if needed for migraines.       No current facility-administered medications for this visit.    Allergies  Allergen Reactions  . Sulfonamide Derivatives Itching and Rash  . Alendronate Sodium Nausea And Vomiting and Other (See Comments)    "  tears up my stomach." and lot of other stomach problems   . Demerol Nausea And Vomiting  . Nsaids Nausea Only and Other (See Comments)    "tears up my stomach", causes heartburn, and lot of other stomach problems  . Risedronate Sodium Nausea And Vomiting and Other (See Comments)    "tears up her stomach" and causes a lot of other stomach issues  . Rofecoxib Nausea And Vomiting and Other (See Comments)    "Ate up my stomach.  Doctor told me it might have been eating a hole in my stomach." jkl    Patient Active Problem List   Diagnosis Date Noted  . Heart palpitations 07/04/2012  . Hyponatremia 07/04/2012  . Lumbar radiculopathy 06/03/2012  . 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 PARESTHESIA 11/11/2009  . PERIPHERAL NEUROPATHY 01/29/2008  . OSTEOPENIA 01/27/2008  . SKIN CANCER, HX OF 01/27/2008  . GOITER, NONTOXIC MULTINODULAR  10/23/2006  . HYPERLIPIDEMIA NEC/NOS 10/23/2006  . FIBROMYALGIA 10/23/2006    History  Smoking status  . Never Smoker   Smokeless tobacco  . Not on file    History  Alcohol Use  . 1.2 oz/week  . 2 Glasses of wine per week    Family History  Problem Relation Age of Onset  . Alcohol abuse Mother   . Hypertension Mother   . Diabetes Mother   . Stroke Mother   . Alzheimer's disease Father   . Cancer Maternal Aunt     pancreatic    Review of Systems: Constitutional: no fever chills diaphoresis or fatigue or change in weight.  Head and neck: no hearing loss, no epistaxis, no photophobia or visual disturbance. Respiratory: No cough, shortness of breath or wheezing. Cardiovascular: No chest pain peripheral edema, palpitations. Gastrointestinal: No abdominal distention, no abdominal pain, no change in bowel habits hematochezia or melena. Genitourinary: No dysuria, no frequency, no urgency, no nocturia. Musculoskeletal:No arthralgias, no back pain, no gait disturbance or myalgias. Neurological: No dizziness, no headaches, no numbness, no seizures, no syncope, no weakness, no tremors. Hematologic: No lymphadenopathy, no easy bruising. Psychiatric: No confusion, no hallucinations, no sleep disturbance.    Physical Exam: Filed Vitals:   07/09/12 0847  BP: 122/68  Pulse: 66   the general appearance reveals a petite thin woman in no acute distress.The head and neck exam reveals pupils equal and reactive.  Extraocular movements are full.  There is no scleral icterus.  The mouth and pharynx are normal.  The neck is supple.  The carotids reveal no bruits.  The jugular venous pressure is normal.  The  thyroid is not enlarged.  There is no lymphadenopathy.  The chest is clear to percussion and auscultation.  There are no rales or rhonchi.  Expansion of the chest is symmetrical.  The precordium is quiet.  The heart rhythm is regular with occasional premature atrial beats. The first heart  sound is normal.  The second heart sound is physiologically split.  There is no murmur gallop rub or click.  There is no abnormal lift or heave.  The abdomen is soft and nontender.  The bowel sounds are normal.  The liver and spleen are not enlarged.  There are no abdominal masses.  There are no abdominal bruits.  Extremities reveal good pedal pulses.  There is no phlebitis or edema.  There is no cyanosis or clubbing.  Strength is normal and symmetrical in all extremities.  There is no lateralizing weakness.  There are no sensory deficits.  The skin is warm and dry.  There is no rash.     Assessment / Plan: As noted above we will obtain carotid Dopplers, two-dimensional echocardiogram, 48 hour monitor, and we will recheck basal metabolic panel today.  We will have her empirically cut back on 24-hour fluids to 1500 cc a day to see if this will make a difference.  Consider obtaining serum cortisol levels in the future if this has not already been done by Dr. Alwyn Ren.  She will leave off her atenolol since she does not feel that it helps her and she thinks that it may be making her weak. We will attempt to retrieve old cardiology records regarding her previous workup.

## 2012-07-09 NOTE — Assessment & Plan Note (Signed)
The patient has a history of chronic hyponatremia.  She has not really been cutting back on fluids and her recent BUN and creatinine were low suggesting plenty of glomerular perfusion.  She states that she is already in a moderate amount of table salt to her food.  At this point we will.  cut back on her daily fluid intake to 1500 cc and observe response.  We are drawing a repeat basal metabolic panel today as a baseline.

## 2012-07-09 NOTE — Patient Instructions (Signed)
Will obtain labs today and call you with the results (bmet)  You need to limit your fluid intake to 1500 cc a day  Add a little salt to your diet  Your physician has requested that you have a carotid duplex. This test is an ultrasound of the carotid arteries in your neck. It looks at blood flow through these arteries that supply the brain with blood. Allow one hour for this exam. There are no restrictions or special instructions.  Your physician has requested that you have an echocardiogram. Echocardiography is a painless test that uses sound waves to create images of your heart. It provides your doctor with information about the size and shape of your heart and how well your heart's chambers and valves are working. This procedure takes approximately one hour. There are no restrictions for this procedure.

## 2012-07-10 ENCOUNTER — Telehealth: Payer: Self-pay | Admitting: *Deleted

## 2012-07-10 NOTE — Telephone Encounter (Signed)
Advised patient of lab results  

## 2012-07-10 NOTE — Telephone Encounter (Signed)
Message copied by Burnell Blanks on Wed Jul 10, 2012  1:21 PM ------      Message from: Cassell Clement      Created: Tue Jul 09, 2012  4:45 PM       Please report.  A serum sodium is slightly improved since 07/04/12.  Continue with fluid restriction and with eating extra salt and we should plan to recheck a basal metabolic panel in about a week ------

## 2012-07-11 ENCOUNTER — Ambulatory Visit (HOSPITAL_COMMUNITY): Payer: Medicare Other | Attending: Cardiology

## 2012-07-11 ENCOUNTER — Other Ambulatory Visit (HOSPITAL_COMMUNITY): Payer: Medicare Other

## 2012-07-11 DIAGNOSIS — R0602 Shortness of breath: Secondary | ICD-10-CM

## 2012-07-11 DIAGNOSIS — G7109 Other specified muscular dystrophies: Secondary | ICD-10-CM | POA: Insufficient documentation

## 2012-07-11 DIAGNOSIS — R0609 Other forms of dyspnea: Secondary | ICD-10-CM | POA: Diagnosis not present

## 2012-07-11 DIAGNOSIS — R0989 Other specified symptoms and signs involving the circulatory and respiratory systems: Secondary | ICD-10-CM | POA: Insufficient documentation

## 2012-07-11 DIAGNOSIS — R002 Palpitations: Secondary | ICD-10-CM | POA: Insufficient documentation

## 2012-07-11 DIAGNOSIS — R42 Dizziness and giddiness: Secondary | ICD-10-CM | POA: Insufficient documentation

## 2012-07-11 NOTE — Progress Notes (Signed)
Echocardiogram performed.  

## 2012-07-16 ENCOUNTER — Ambulatory Visit (INDEPENDENT_AMBULATORY_CARE_PROVIDER_SITE_OTHER): Payer: Medicare Other | Admitting: Cardiology

## 2012-07-16 ENCOUNTER — Encounter (INDEPENDENT_AMBULATORY_CARE_PROVIDER_SITE_OTHER): Payer: Medicare Other

## 2012-07-16 DIAGNOSIS — R42 Dizziness and giddiness: Secondary | ICD-10-CM | POA: Diagnosis not present

## 2012-07-16 DIAGNOSIS — E875 Hyperkalemia: Secondary | ICD-10-CM

## 2012-07-16 DIAGNOSIS — R002 Palpitations: Secondary | ICD-10-CM

## 2012-07-16 DIAGNOSIS — E871 Hypo-osmolality and hyponatremia: Secondary | ICD-10-CM

## 2012-07-16 LAB — BASIC METABOLIC PANEL
CO2: 32 mEq/L (ref 19–32)
Calcium: 9.9 mg/dL (ref 8.4–10.5)
Creatinine, Ser: 0.6 mg/dL (ref 0.4–1.2)

## 2012-07-18 ENCOUNTER — Other Ambulatory Visit: Payer: Self-pay | Admitting: *Deleted

## 2012-07-18 DIAGNOSIS — E875 Hyperkalemia: Secondary | ICD-10-CM

## 2012-07-19 ENCOUNTER — Telehealth: Payer: Self-pay | Admitting: *Deleted

## 2012-07-19 LAB — BASIC METABOLIC PANEL
BUN: 12 mg/dL (ref 6–23)
Creatinine, Ser: 0.5 mg/dL (ref 0.4–1.2)
GFR: 119.55 mL/min (ref >60.00)
Potassium: 5.3 mEq/L — ABNORMAL HIGH (ref 3.5–5.1)

## 2012-07-19 NOTE — Telephone Encounter (Signed)
NSR with frequent runs of bigeminy PVC's, no dangerous arrhythmias seen per  Dr. Patty Sermons. Advised patient

## 2012-07-19 NOTE — Telephone Encounter (Signed)
Message copied by Burnell Blanks on Fri Jul 19, 2012  6:44 PM ------      Message from: Cassell Clement      Created: Thu Jul 18, 2012  8:35 PM       Please report. Carotid dopplers show very tortuous arteries. The internal carotid arteries show less than 80% stenosis bilaterally and likely less than that because the tortuousity causes falsely high velocities. Recheck in 6 month.       ------

## 2012-07-19 NOTE — Telephone Encounter (Signed)
Advised patient of lab results  

## 2012-07-22 ENCOUNTER — Encounter: Payer: Medicare Other | Admitting: Physician Assistant

## 2012-08-20 ENCOUNTER — Ambulatory Visit (INDEPENDENT_AMBULATORY_CARE_PROVIDER_SITE_OTHER): Payer: Medicare Other | Admitting: Internal Medicine

## 2012-08-20 ENCOUNTER — Encounter: Payer: Self-pay | Admitting: Internal Medicine

## 2012-08-20 VITALS — BP 118/68 | HR 68 | Temp 98.0°F | Resp 12 | Ht 66.5 in | Wt 116.0 lb

## 2012-08-20 DIAGNOSIS — Z85828 Personal history of other malignant neoplasm of skin: Secondary | ICD-10-CM | POA: Diagnosis not present

## 2012-08-20 DIAGNOSIS — M899 Disorder of bone, unspecified: Secondary | ICD-10-CM | POA: Diagnosis not present

## 2012-08-20 DIAGNOSIS — M79642 Pain in left hand: Secondary | ICD-10-CM

## 2012-08-20 DIAGNOSIS — M949 Disorder of cartilage, unspecified: Secondary | ICD-10-CM | POA: Diagnosis not present

## 2012-08-20 DIAGNOSIS — Z0000 Encounter for general adult medical examination without abnormal findings: Secondary | ICD-10-CM | POA: Diagnosis not present

## 2012-08-20 DIAGNOSIS — K5730 Diverticulosis of large intestine without perforation or abscess without bleeding: Secondary | ICD-10-CM | POA: Insufficient documentation

## 2012-08-20 DIAGNOSIS — E875 Hyperkalemia: Secondary | ICD-10-CM

## 2012-08-20 DIAGNOSIS — E785 Hyperlipidemia, unspecified: Secondary | ICD-10-CM | POA: Diagnosis not present

## 2012-08-20 DIAGNOSIS — M79609 Pain in unspecified limb: Secondary | ICD-10-CM

## 2012-08-20 DIAGNOSIS — M79641 Pain in right hand: Secondary | ICD-10-CM

## 2012-08-20 HISTORY — DX: Diverticulosis of large intestine without perforation or abscess without bleeding: K57.30

## 2012-08-20 LAB — CBC WITH DIFFERENTIAL/PLATELET
Eosinophils Relative: 3.4 % (ref 0.0–5.0)
HCT: 43 % (ref 36.0–46.0)
Hemoglobin: 14.3 g/dL (ref 12.0–15.0)
Lymphs Abs: 0.9 10*3/uL (ref 0.7–4.0)
Monocytes Relative: 5.9 % (ref 3.0–12.0)
Neutro Abs: 3 10*3/uL (ref 1.4–7.7)
WBC: 4.4 10*3/uL — ABNORMAL LOW (ref 4.5–10.5)

## 2012-08-20 NOTE — Patient Instructions (Addendum)
Share results with all non [facility] medical staff seen  

## 2012-08-20 NOTE — Progress Notes (Signed)
Subjective:    Patient ID: Alicia [name], female    DOB: 1945/09/08, 67 y.o.   MRN: [id number]  HPI Medicare Wellness Visit:  Psychosocial & medical history were reviewed as required by Medicare (abuse,antisocial behavioral risks,firearm risk).  Social history: caffeine: minimal , alcohol:  2 / week ,  tobacco use:  never  Exercise : unable to exercise due to " L 4- S1 nerve root pain" Home & personal  safety / fall risk:some imbalance related to pain; cane employed Limitations of activities of daily living: husband helps with housework Seatbelt  and smoke alarm use:yes Power of Attorney/Living Will status : needs updating Ophthalmology exam status :current Hearing evaluation status:not current Orientation :oriented X 3 Memory & recall :good Spelling  testing:good Active depression / anxiety:denied Foreign travel history : [place] age 67 Immunization status for Shingles /Flu/ PNA/ tetanus :current Transfusion history:  no Preventive health surveillance status of colonoscopy, BMD , mammograms as per protocol/ SOC: current except no PAP for ? 4  Years (S/P TAH) Dental care: every 12 mos Chart reviewed &  Updated. Active issues reviewed & addressed.      Review of Systems She is on a heart healthy diet; she is unable to exercise as noted . PVCs in context of electrolyte imbalance evaluated by Dr Patty Sermons, Cardiologist. She denies chest pain, palpitations, dyspnea, or claudication @ this time. Family history is negative for premature coronary disease Her HDL goal has been very high. Significant  myalgias related to her Muscular Dystrophy.  She has been having aching in hands 2-3 days; NSAID w/o benefit. She also had intermittent R C8 radicular pain. .     Objective:   Physical Exam Gen.: Thin but adequately nourished in appearance. Alert, appropriate and cooperative throughout exam. Head: Normocephalic without obvious abnormalities  Eyes: No corneal or conjunctival  inflammation noted.  Extraocular motion intact. Vision grossly normal with lenses Ears: External  ear exam reveals no significant lesions or deformities. Canals clear .TMs normal. Hearing is grossly normal bilaterally. Nose: External nasal exam reveals no deformity or inflammation. Nasal mucosa are pink and moist. No lesions or exudates noted.  Mouth: Oral mucosa and oropharynx reveal no lesions or exudates. Teeth in good repair. Neck: No deformities, masses, or tenderness noted. Range of motion decreased. Thyroid small. Lungs: Normal respiratory effort; chest expands symmetrically. Lungs are clear to auscultation without rales, wheezes, or increased work of breathing. Heart: Normal rate and rhythm. Normal S1 and S2. No gallop, click, or rub. No murmur. No prematures Abdomen: Bowel sounds normal; abdomen soft and nontender. No masses, organomegaly or hernias noted. Aorta palpable ; no AAA Genitalia/Breast : As per Gyn                                  Musculoskeletal/extremities: There is some asymmetry of the posterior thoracic musculature compatible with scoliosis. No clubbing, cyanosis, edema, or significant extremity  deformity noted. Range of motion of legs decreased due to pain .Tone & strength  Normal. Joints normal / reveal mild  DJD DIP changes. Nail health good. Able to lie down & sit up w/o help. Negative SLR bilaterally but pain in R buttock  @ 45 degrees elevation Vascular: Carotid, radial artery,  and  posterior tibial pulses are full and equal. Decreased dorsalis pedis pulses.No bruits present. Neurologic: Alert and oriented x3. Deep tendon reflexes symmetrical and normal.        Skin:  Intact without suspicious lesions or rashes. Vector bite R shin Lymph: No cervical, axillary lymphadenopathy present. Psych: Mood and affect are normal. Normally interactive                                                                                        Assessment & Plan:  #1 Medicare  Wellness Exam; criteria met ; data entered #2 Problem List/Diagnoses reviewed Plan:  Assessments made/ Orders entered

## 2012-08-21 LAB — BASIC METABOLIC PANEL
Calcium: 9.6 mg/dL (ref 8.4–10.5)
GFR: 101.9 mL/min (ref >60.00)
Sodium: 139 mEq/L (ref 135–145)

## 2012-08-21 LAB — TSH: TSH: 4.14 u[IU]/mL (ref 0.35–5.50)

## 2012-08-21 LAB — LIPID PANEL: Total CHOL/HDL Ratio: 2

## 2012-08-21 LAB — LDL CHOLESTEROL, DIRECT: Direct LDL: 124.7 mg/dL

## 2012-08-25 LAB — VITAMIN D 1,25 DIHYDROXY: Vitamin D 1, 25 (OH)2 Total: 45 pg/mL (ref 18–72)

## 2012-08-26 ENCOUNTER — Encounter: Payer: Self-pay | Admitting: *Deleted

## 2012-08-29 ENCOUNTER — Ambulatory Visit (INDEPENDENT_AMBULATORY_CARE_PROVIDER_SITE_OTHER): Payer: Medicare Other | Admitting: Internal Medicine

## 2012-08-29 VITALS — BP 118/64 | HR 78 | Temp 98.4°F | Wt 118.0 lb

## 2012-08-29 DIAGNOSIS — H9190 Unspecified hearing loss, unspecified ear: Secondary | ICD-10-CM | POA: Diagnosis not present

## 2012-08-29 DIAGNOSIS — H00019 Hordeolum externum unspecified eye, unspecified eyelid: Secondary | ICD-10-CM | POA: Diagnosis not present

## 2012-08-29 DIAGNOSIS — H9312 Tinnitus, left ear: Secondary | ICD-10-CM

## 2012-08-29 DIAGNOSIS — H9319 Tinnitus, unspecified ear: Secondary | ICD-10-CM | POA: Diagnosis not present

## 2012-08-29 DIAGNOSIS — H9192 Unspecified hearing loss, left ear: Secondary | ICD-10-CM

## 2012-08-29 DIAGNOSIS — H00016 Hordeolum externum left eye, unspecified eyelid: Secondary | ICD-10-CM

## 2012-08-29 MED ORDER — ERYTHROMYCIN 5 MG/GM OP OINT: TOPICAL_OINTMENT | Freq: Four times a day (QID) | OPHTHALMIC | Status: DC

## 2012-08-29 NOTE — Progress Notes (Signed)
Subjective:    Patient ID: Alicia [name], female    DOB: Apr 29, 1945, 67 y.o.   MRN: [id number]  HPI  Symptoms began as some hearing loss with constant "roaring" sensation in the left ear w/o trauma or trigger 08/25/12. She has had some lightheadedness with position change but dizziness is not a major symptom. Symptoms appear to be worse when walking & also in afternoon  & evening.  Some ear discomfort on L with loud noise.She has had some associated post nasal drainage; decongestant did not help. She was taking eight 81mg  ASA for LBP  5-7 days the week prior to symptoms. No loud noise exposure  No sensation of room spinning or sensation of near fainting. No physical factor contributed to symptoms such as turning in bed;arising from bed or chair; rotation of neck to either side;looking up over head;straining such as lifting; pain;heart palpitations;Irregular heart rhythm;change in  heart rate ;headache;weakness in arm(s)  or leg(s); or new numbness &/or tingling in arm or leg    No intervention  decreased symptoms such as  position change or avoidance of any specific position or  activity  No associated serious symptoms such as seizure activity or passing out. 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 Dr Dorma Russell ,ENT , treated her for sudden hearing loss 20-25 years ago.   No history of head injury ,loss of consciousness, or seizures         Review of Systems No associated fever , chills, sweats , nausea,change in weight. No sinus pressure or discolored nasal secretions.  No discharge from ears. No blurred vision ;double vision;or loss of vision No acute shortness of breath;cough;coughing up blood. No significant anxiety,panic , depression     Objective:   Physical Exam Gen.: Thin but adequately nourished in appearance. Alert, appropriate and cooperative throughout exam. Head: Normocephalic without obvious abnormalities Eyes: No corneal or conjunctival inflammation  noted. Sty OS lower lid.Pupils equal round reactive to light and accommodation. FOV normal. Extraocular motion intact w/o nystagmus. Vision grossly normal with lenses Ears: External  ear exam reveals no significant lesions or deformities. Canals clear .TMs normal. Hearing is grossly decreased bilaterally.Tuning fork exam normal. Nose: External nasal exam reveals no deformity or inflammation. Nasal mucosa are pink and moist. No lesions or exudates noted.   Mouth: Oral mucosa and oropharynx reveal no lesions or exudates. Teeth in good repair. Neck: No deformities, masses, or tenderness noted. Range of motion decreased. Lungs: Normal respiratory effort; chest expands symmetrically. Lungs are clear to auscultation without rales, wheezes, or increased work of breathing. Heart: Normal rate and rhythm. Normal S1 and S2. No gallop, click, or rub. Grade 1/2 - 1 over 6 systolic murmur. Musculoskeletal/extremities: There is some asymmetry of the posterior thoracic musculature suggesting occult scoliosis. No clubbing, cyanosis, edema, or significant extremity  deformity noted. Tone  & strength normal. Joints normal. Nail health good. Vascular: Carotid & radial artery pulses are full and equal. No bruits present. Neurologic: Alert and oriented x3. Deep tendon reflexes symmetrical and normal.  Gait normal  including heel & toe walking .  No tremor.Rhomberg & finger to nose testing normal.       Skin: Intact without suspicious lesions or rashes. Lymph: No cervical, axillary lymphadenopathy present. Psych: Mood and affect are normal. Normally interactive  Assessment & Plan:  #1 tinnitus with hearing loss in context of recent increase aspirin intake  #2 mild imbalance with negative neurologic exam  #3 sty  Plan: See orders and recommendations

## 2012-08-29 NOTE — Patient Instructions (Addendum)
Go to WebMD for information about tinnitus. Avoid excess aspirin and excess noise exposure. "White noise" machine @ bedside can prevent tinnitus from affecting sleep.  ENT referral will be completed. Please review the record and make any corrections; share this with all medical staff seen.

## 2012-09-03 ENCOUNTER — Ambulatory Visit: Payer: Medicare Other | Admitting: Internal Medicine

## 2012-09-03 DIAGNOSIS — M6281 Muscle weakness (generalized): Secondary | ICD-10-CM | POA: Diagnosis not present

## 2012-09-03 DIAGNOSIS — M5126 Other intervertebral disc displacement, lumbar region: Secondary | ICD-10-CM | POA: Diagnosis not present

## 2012-09-03 DIAGNOSIS — IMO0002 Reserved for concepts with insufficient information to code with codable children: Secondary | ICD-10-CM | POA: Diagnosis not present

## 2012-09-03 DIAGNOSIS — G7109 Other specified muscular dystrophies: Secondary | ICD-10-CM | POA: Diagnosis not present

## 2012-09-03 DIAGNOSIS — M549 Dorsalgia, unspecified: Secondary | ICD-10-CM | POA: Diagnosis not present

## 2012-09-12 DIAGNOSIS — G7109 Other specified muscular dystrophies: Secondary | ICD-10-CM | POA: Diagnosis not present

## 2012-09-12 DIAGNOSIS — M6281 Muscle weakness (generalized): Secondary | ICD-10-CM | POA: Diagnosis not present

## 2012-09-12 DIAGNOSIS — M549 Dorsalgia, unspecified: Secondary | ICD-10-CM | POA: Diagnosis not present

## 2012-09-19 DIAGNOSIS — H5200 Hypermetropia, unspecified eye: Secondary | ICD-10-CM | POA: Diagnosis not present

## 2012-09-19 DIAGNOSIS — H353190 Nonexudative age-related macular degeneration, unspecified eye, stage unspecified: Secondary | ICD-10-CM | POA: Diagnosis not present

## 2012-09-19 DIAGNOSIS — H524 Presbyopia: Secondary | ICD-10-CM | POA: Diagnosis not present

## 2012-09-19 DIAGNOSIS — H52229 Regular astigmatism, unspecified eye: Secondary | ICD-10-CM | POA: Diagnosis not present

## 2012-10-01 DIAGNOSIS — R32 Unspecified urinary incontinence: Secondary | ICD-10-CM | POA: Diagnosis not present

## 2012-10-01 DIAGNOSIS — Z01419 Encounter for gynecological examination (general) (routine) without abnormal findings: Secondary | ICD-10-CM | POA: Diagnosis not present

## 2012-10-02 DIAGNOSIS — M549 Dorsalgia, unspecified: Secondary | ICD-10-CM | POA: Diagnosis not present

## 2012-10-02 DIAGNOSIS — R209 Unspecified disturbances of skin sensation: Secondary | ICD-10-CM | POA: Diagnosis not present

## 2012-10-09 DIAGNOSIS — H8109 Meniere's disease, unspecified ear: Secondary | ICD-10-CM | POA: Diagnosis not present

## 2012-10-09 DIAGNOSIS — H905 Unspecified sensorineural hearing loss: Secondary | ICD-10-CM | POA: Diagnosis not present

## 2012-10-09 DIAGNOSIS — H903 Sensorineural hearing loss, bilateral: Secondary | ICD-10-CM | POA: Diagnosis not present

## 2012-10-25 DIAGNOSIS — M48061 Spinal stenosis, lumbar region without neurogenic claudication: Secondary | ICD-10-CM | POA: Diagnosis not present

## 2012-11-13 DIAGNOSIS — M533 Sacrococcygeal disorders, not elsewhere classified: Secondary | ICD-10-CM | POA: Diagnosis not present

## 2012-11-14 DIAGNOSIS — M533 Sacrococcygeal disorders, not elsewhere classified: Secondary | ICD-10-CM | POA: Diagnosis not present

## 2012-11-21 DIAGNOSIS — M533 Sacrococcygeal disorders, not elsewhere classified: Secondary | ICD-10-CM | POA: Diagnosis not present

## 2012-11-27 DIAGNOSIS — M533 Sacrococcygeal disorders, not elsewhere classified: Secondary | ICD-10-CM | POA: Diagnosis not present

## 2012-11-29 DIAGNOSIS — M533 Sacrococcygeal disorders, not elsewhere classified: Secondary | ICD-10-CM | POA: Diagnosis not present

## 2012-12-02 DIAGNOSIS — M899 Disorder of bone, unspecified: Secondary | ICD-10-CM | POA: Diagnosis not present

## 2012-12-02 DIAGNOSIS — M533 Sacrococcygeal disorders, not elsewhere classified: Secondary | ICD-10-CM | POA: Diagnosis not present

## 2012-12-04 ENCOUNTER — Telehealth: Payer: Self-pay | Admitting: Internal Medicine

## 2012-12-04 HISTORY — PX: LUMBAR LAMINECTOMY: SHX95

## 2012-12-04 NOTE — Telephone Encounter (Signed)
Patient called to let dr hopper know she is having back surgery on October 23rd @ [facility] with Dr Magdalene Patricia @ [facility] number [phone] in case he needed to send something to Dr Alvester Morin.

## 2012-12-04 NOTE — Telephone Encounter (Signed)
Please advise.//AB/CMA 

## 2012-12-11 NOTE — Telephone Encounter (Signed)
Bone density results faxed

## 2012-12-11 NOTE — Telephone Encounter (Signed)
Patient called back to confirm contact info for [facility].  Phn#: [phone] Fax#: [phone]. She also wants her recent bone density results from [facility] sent to her neurosurgeon.

## 2012-12-15 ENCOUNTER — Encounter: Payer: Self-pay | Admitting: Internal Medicine

## 2012-12-19 ENCOUNTER — Encounter: Payer: Self-pay | Admitting: Internal Medicine

## 2012-12-20 DIAGNOSIS — M48061 Spinal stenosis, lumbar region without neurogenic claudication: Secondary | ICD-10-CM | POA: Diagnosis not present

## 2012-12-26 DIAGNOSIS — Z79899 Other long term (current) drug therapy: Secondary | ICD-10-CM | POA: Diagnosis not present

## 2012-12-26 DIAGNOSIS — Z7982 Long term (current) use of aspirin: Secondary | ICD-10-CM | POA: Diagnosis not present

## 2012-12-26 DIAGNOSIS — Z4689 Encounter for fitting and adjustment of other specified devices: Secondary | ICD-10-CM | POA: Diagnosis not present

## 2012-12-26 DIAGNOSIS — G7109 Other specified muscular dystrophies: Secondary | ICD-10-CM | POA: Diagnosis not present

## 2012-12-26 DIAGNOSIS — Z823 Family history of stroke: Secondary | ICD-10-CM | POA: Diagnosis not present

## 2012-12-26 DIAGNOSIS — Z8249 Family history of ischemic heart disease and other diseases of the circulatory system: Secondary | ICD-10-CM | POA: Diagnosis not present

## 2012-12-26 DIAGNOSIS — Z833 Family history of diabetes mellitus: Secondary | ICD-10-CM | POA: Diagnosis not present

## 2012-12-26 DIAGNOSIS — G43909 Migraine, unspecified, not intractable, without status migrainosus: Secondary | ICD-10-CM | POA: Diagnosis not present

## 2012-12-26 DIAGNOSIS — M48061 Spinal stenosis, lumbar region without neurogenic claudication: Secondary | ICD-10-CM | POA: Diagnosis not present

## 2012-12-26 DIAGNOSIS — Z8262 Family history of osteoporosis: Secondary | ICD-10-CM | POA: Diagnosis not present

## 2012-12-26 DIAGNOSIS — Z882 Allergy status to sulfonamides status: Secondary | ICD-10-CM | POA: Diagnosis not present

## 2012-12-26 DIAGNOSIS — Z886 Allergy status to analgesic agent status: Secondary | ICD-10-CM | POA: Diagnosis not present

## 2012-12-26 DIAGNOSIS — Z91013 Allergy to seafood: Secondary | ICD-10-CM | POA: Diagnosis not present

## 2012-12-26 DIAGNOSIS — M48062 Spinal stenosis, lumbar region with neurogenic claudication: Secondary | ICD-10-CM | POA: Diagnosis not present

## 2012-12-26 DIAGNOSIS — M5126 Other intervertebral disc displacement, lumbar region: Secondary | ICD-10-CM | POA: Diagnosis not present

## 2012-12-26 DIAGNOSIS — Z91011 Allergy to milk products: Secondary | ICD-10-CM | POA: Diagnosis not present

## 2012-12-26 DIAGNOSIS — Z83511 Family history of glaucoma: Secondary | ICD-10-CM | POA: Diagnosis not present

## 2012-12-30 ENCOUNTER — Other Ambulatory Visit: Payer: Self-pay | Admitting: Neurology

## 2013-01-01 NOTE — Telephone Encounter (Signed)
Auth 1 refill via [facility]

## 2013-01-14 ENCOUNTER — Encounter: Payer: Self-pay | Admitting: Internal Medicine

## 2013-04-14 DIAGNOSIS — G7109 Other specified muscular dystrophies: Secondary | ICD-10-CM | POA: Diagnosis not present

## 2013-04-14 DIAGNOSIS — M549 Dorsalgia, unspecified: Secondary | ICD-10-CM | POA: Diagnosis not present

## 2013-04-20 ENCOUNTER — Encounter: Payer: Self-pay | Admitting: Internal Medicine

## 2013-05-15 DIAGNOSIS — L821 Other seborrheic keratosis: Secondary | ICD-10-CM | POA: Diagnosis not present

## 2013-05-15 DIAGNOSIS — Z85828 Personal history of other malignant neoplasm of skin: Secondary | ICD-10-CM | POA: Diagnosis not present

## 2013-05-15 DIAGNOSIS — L820 Inflamed seborrheic keratosis: Secondary | ICD-10-CM | POA: Diagnosis not present

## 2013-05-15 DIAGNOSIS — L570 Actinic keratosis: Secondary | ICD-10-CM | POA: Diagnosis not present

## 2013-05-23 DIAGNOSIS — J029 Acute pharyngitis, unspecified: Secondary | ICD-10-CM | POA: Diagnosis not present

## 2013-05-23 DIAGNOSIS — K120 Recurrent oral aphthae: Secondary | ICD-10-CM | POA: Diagnosis not present

## 2013-10-22 DIAGNOSIS — M549 Dorsalgia, unspecified: Secondary | ICD-10-CM | POA: Diagnosis not present

## 2013-10-22 DIAGNOSIS — G7109 Other specified muscular dystrophies: Secondary | ICD-10-CM | POA: Diagnosis not present

## 2013-10-29 DIAGNOSIS — H5200 Hypermetropia, unspecified eye: Secondary | ICD-10-CM | POA: Diagnosis not present

## 2013-10-29 DIAGNOSIS — H353190 Nonexudative age-related macular degeneration, unspecified eye, stage unspecified: Secondary | ICD-10-CM | POA: Diagnosis not present

## 2013-10-29 DIAGNOSIS — H524 Presbyopia: Secondary | ICD-10-CM | POA: Diagnosis not present

## 2013-10-29 DIAGNOSIS — H52229 Regular astigmatism, unspecified eye: Secondary | ICD-10-CM | POA: Diagnosis not present

## 2013-11-06 ENCOUNTER — Other Ambulatory Visit: Payer: Self-pay | Admitting: *Deleted

## 2013-12-22 DIAGNOSIS — Z13220 Encounter for screening for lipoid disorders: Secondary | ICD-10-CM | POA: Diagnosis not present

## 2013-12-22 DIAGNOSIS — J3489 Other specified disorders of nose and nasal sinuses: Secondary | ICD-10-CM | POA: Diagnosis not present

## 2013-12-22 DIAGNOSIS — M797 Fibromyalgia: Secondary | ICD-10-CM | POA: Diagnosis not present

## 2013-12-22 DIAGNOSIS — N3943 Post-void dribbling: Secondary | ICD-10-CM | POA: Diagnosis not present

## 2013-12-22 DIAGNOSIS — Z136 Encounter for screening for cardiovascular disorders: Secondary | ICD-10-CM | POA: Diagnosis not present

## 2013-12-22 DIAGNOSIS — L299 Pruritus, unspecified: Secondary | ICD-10-CM | POA: Diagnosis not present

## 2013-12-22 DIAGNOSIS — G710 Muscular dystrophy: Secondary | ICD-10-CM | POA: Diagnosis not present

## 2013-12-22 DIAGNOSIS — Z0000 Encounter for general adult medical examination without abnormal findings: Secondary | ICD-10-CM | POA: Diagnosis not present

## 2014-02-27 ENCOUNTER — Emergency Department (HOSPITAL_COMMUNITY)
Admission: EM | Admit: 2014-02-27 | Discharge: 2014-02-27 | Disposition: A | Discharge status: Home / Self Care | Payer: Medicare Other | Attending: Emergency Medicine | Admitting: Emergency Medicine

## 2014-02-27 ENCOUNTER — Encounter (HOSPITAL_COMMUNITY): Payer: Self-pay | Admitting: Emergency Medicine

## 2014-02-27 DIAGNOSIS — Z79899 Other long term (current) drug therapy: Secondary | ICD-10-CM | POA: Diagnosis not present

## 2014-02-27 DIAGNOSIS — R112 Nausea with vomiting, unspecified: Secondary | ICD-10-CM | POA: Diagnosis present

## 2014-02-27 DIAGNOSIS — M1990 Unspecified osteoarthritis, unspecified site: Secondary | ICD-10-CM | POA: Insufficient documentation

## 2014-02-27 DIAGNOSIS — Z8742 Personal history of other diseases of the female genital tract: Secondary | ICD-10-CM | POA: Diagnosis not present

## 2014-02-27 DIAGNOSIS — Z7982 Long term (current) use of aspirin: Secondary | ICD-10-CM | POA: Diagnosis not present

## 2014-02-27 DIAGNOSIS — R110 Nausea: Secondary | ICD-10-CM | POA: Diagnosis not present

## 2014-02-27 DIAGNOSIS — M8580 Other specified disorders of bone density and structure, unspecified site: Secondary | ICD-10-CM | POA: Diagnosis not present

## 2014-02-27 DIAGNOSIS — Z85828 Personal history of other malignant neoplasm of skin: Secondary | ICD-10-CM | POA: Diagnosis not present

## 2014-02-27 DIAGNOSIS — E871 Hypo-osmolality and hyponatremia: Secondary | ICD-10-CM | POA: Diagnosis not present

## 2014-02-27 DIAGNOSIS — R42 Dizziness and giddiness: Secondary | ICD-10-CM | POA: Diagnosis not present

## 2014-02-27 DIAGNOSIS — G629 Polyneuropathy, unspecified: Secondary | ICD-10-CM | POA: Insufficient documentation

## 2014-02-27 DIAGNOSIS — Z8719 Personal history of other diseases of the digestive system: Secondary | ICD-10-CM | POA: Insufficient documentation

## 2014-02-27 LAB — CBC WITH DIFFERENTIAL/PLATELET
BASOS ABS: 0 10*3/uL (ref 0.0–0.1)
Basophils Relative: 0 % (ref 0–1)
EOS ABS: 0.1 10*3/uL (ref 0.0–0.7)
EOS PCT: 1 % (ref 0–5)
HCT: 36.8 % (ref 36.0–46.0)
Hemoglobin: 12.3 g/dL (ref 12.0–15.0)
LYMPHS PCT: 16 % (ref 12–46)
Lymphs Abs: 1 10*3/uL (ref 0.7–4.0)
MCH: 29.4 pg (ref 26.0–34.0)
MCHC: 33.4 g/dL (ref 30.0–36.0)
MCV: 88 fL (ref 78.0–100.0)
Monocytes Absolute: 0.4 10*3/uL (ref 0.1–1.0)
Monocytes Relative: 6 % (ref 3–12)
NEUTROS PCT: 77 % (ref 43–77)
Neutro Abs: 4.7 10*3/uL (ref 1.7–7.7)
PLATELETS: 240 10*3/uL (ref 150–400)
RBC: 4.18 MIL/uL (ref 3.87–5.11)
RDW: 14 % (ref 11.5–15.5)
WBC: 6.1 10*3/uL (ref 4.0–10.5)

## 2014-02-27 LAB — COMPREHENSIVE METABOLIC PANEL
ALK PHOS: 59 U/L (ref 39–117)
ALT: 17 U/L (ref 0–35)
AST: 27 U/L (ref 0–37)
Albumin: 4.5 g/dL (ref 3.5–5.2)
Anion gap: 6 (ref 5–15)
BUN: 13 mg/dL (ref 6–23)
CALCIUM: 9.3 mg/dL (ref 8.4–10.5)
CO2: 28 mmol/L (ref 19–32)
Chloride: 96 mEq/L (ref 96–112)
Creatinine, Ser: 0.34 mg/dL — ABNORMAL LOW (ref 0.50–1.10)
GFR calc Af Amer: >90 mL/min (ref >90)
GFR calc non Af Amer: >90 mL/min (ref >90)
Glucose, Bld: 132 mg/dL — ABNORMAL HIGH (ref 70–99)
POTASSIUM: 3.4 mmol/L — AB (ref 3.5–5.1)
SODIUM: 130 mmol/L — AB (ref 135–145)
TOTAL PROTEIN: 6.9 g/dL (ref 6.0–8.3)
Total Bilirubin: 0.5 mg/dL (ref 0.3–1.2)

## 2014-02-27 LAB — URINALYSIS, ROUTINE W REFLEX MICROSCOPIC
Bilirubin Urine: NEGATIVE
GLUCOSE, UA: NEGATIVE mg/dL
Hgb urine dipstick: NEGATIVE
Ketones, ur: NEGATIVE mg/dL
LEUKOCYTES UA: NEGATIVE
Nitrite: NEGATIVE
PH: 7.5 (ref 5.0–8.0)
Protein, ur: NEGATIVE mg/dL
Specific Gravity, Urine: 1.011 (ref 1.005–1.030)
Urobilinogen, UA: 0.2 mg/dL (ref 0.0–1.0)

## 2014-02-27 LAB — LIPASE, BLOOD: Lipase: 22 U/L (ref 11–59)

## 2014-02-27 MED ORDER — ONDANSETRON HCL 4 MG/2ML IJ SOLN
4.0000 mg | Freq: Once | INTRAMUSCULAR | Status: AC
  Administered 2014-02-27: 4 mg via INTRAVENOUS
  Filled 2014-02-27: qty 2

## 2014-02-27 MED ORDER — SODIUM CHLORIDE 0.9 % IV SOLN
1000.0000 mL | INTRAVENOUS | Status: DC
  Administered 2014-02-27: 1000 mL via INTRAVENOUS

## 2014-02-27 MED ORDER — SODIUM CHLORIDE 0.9 % IV SOLN
1000.0000 mL | Freq: Once | INTRAVENOUS | Status: AC
  Administered 2014-02-27: 1000 mL via INTRAVENOUS

## 2014-02-27 MED ORDER — ONDANSETRON HCL 4 MG PO TABS: 4.0000 mg | ORAL_TABLET | Freq: Four times a day (QID) | ORAL | Status: DC

## 2014-02-27 NOTE — ED Notes (Signed)
Pt refusing EKG until nausea medication is give pt is heaved over side of bed held by husband constantly burping and dry heaving. RN notified

## 2014-02-27 NOTE — ED Notes (Signed)
Pt states she has been vomiting x1 hour. Pt has hx of hyponatremia, reports taking her sodium tablets on an empty stomach. Pt reports she has been busy today. Pt denies pain. Pt is alert and oriented. Pt reports she is very nauseated.

## 2014-02-27 NOTE — ED Notes (Signed)
Provided warm blankets and ice chips.

## 2014-02-27 NOTE — Discharge Instructions (Signed)
Dizziness °Dizziness is a common problem. It is a feeling of unsteadiness or light-headedness. You may feel like you are about to faint. Dizziness can lead to injury if you stumble or fall. A person of any age group can suffer from dizziness, but dizziness is more common in older adults. °CAUSES  °Dizziness can be caused by many different things, including: °· Middle ear problems. °· Standing for too long. °· Infections. °· An allergic reaction. °· Aging. °· An emotional response to something, such as the sight of blood. °· Side effects of medicines. °· Tiredness. °· Problems with circulation or blood pressure. °· Excessive use of alcohol or medicines, or illegal drug use. °· Breathing too fast (hyperventilation). °· An irregular heart rhythm (arrhythmia). °· A low red blood cell count (anemia). °· Pregnancy. °· Vomiting, diarrhea, fever, or other illnesses that cause body fluid loss (dehydration). °· Diseases or conditions such as Parkinson's disease, high blood pressure (hypertension), diabetes, and thyroid problems. °· Exposure to extreme heat. °DIAGNOSIS  °Your health care provider will ask about your symptoms, perform a physical exam, and perform an electrocardiogram (ECG) to record the electrical activity of your heart. Your health care provider may also perform other heart or blood tests to determine the cause of your dizziness. These may include: °· Transthoracic echocardiogram (TTE). During echocardiography, sound waves are used to evaluate how blood flows through your heart. °· Transesophageal echocardiogram (TEE). °· Cardiac monitoring. This allows your health care provider to monitor your heart rate and rhythm in real time. °· Holter monitor. This is a portable device that records your heartbeat and can help diagnose heart arrhythmias. It allows your health care provider to track your heart activity for several days if needed. °· Stress tests by exercise or by giving medicine that makes the heart beat  faster. °TREATMENT  °Treatment of dizziness depends on the cause of your symptoms and can vary greatly. °HOME CARE INSTRUCTIONS  °· Drink enough fluids to keep your urine clear or pale yellow. This is especially important in very hot weather. In older adults, it is also important in cold weather. °· Take your medicine exactly as directed if your dizziness is caused by medicines. When taking blood pressure medicines, it is especially important to get up slowly. °· Rise slowly from chairs and steady yourself until you feel okay. °· In the morning, first sit up on the side of the bed. When you feel okay, stand slowly while holding onto something until you know your balance is fine. °· Move your legs often if you need to stand in one place for a long time. Tighten and relax your muscles in your legs while standing. °· Have someone stay with you for 1-2 days if dizziness continues to be a problem. Do this until you feel you are well enough to stay alone. Have the person call your health care provider if he or she notices changes in you that are concerning. °· Do not drive or use heavy machinery if you feel dizzy. °· Do not drink alcohol. °SEEK IMMEDIATE MEDICAL CARE IF:  °· Your dizziness or light-headedness gets worse. °· You feel nauseous or vomit. °· You have problems talking, walking, or using your arms, hands, or legs. °· You feel weak. °· You are not thinking clearly or you have trouble forming sentences. It may take a friend or family member to notice this. °· You have chest pain, abdominal pain, shortness of breath, or sweating. °· Your vision changes. °· You notice   any bleeding.  You have side effects from medicine that seems to be getting worse rather than better. MAKE SURE YOU:   Understand these instructions.  Will watch your condition.  Will get help right away if you are not doing well or get worse. Document Released: 08/16/2000 Document Revised: 02/25/2013 Document Reviewed: 09/09/2010 [facility]  Patient Information 2015 [place], [place]. This information is not intended to replace advice given to you by your health care provider. Make sure you discuss any questions you have with your health care provider.  Hyponatremia  Hyponatremia is when the amount of salt (sodium) in your blood is too low. When sodium levels are low, your cells will absorb extra water and swell. The swelling happens throughout the body, but it mostly affects the brain. Severe brain swelling (cerebral edema), seizures, or coma can happen.  CAUSES   Heart, kidney, or liver problems.  Thyroid problems.  Adrenal gland problems.  Severe vomiting and diarrhea.  Certain medicines or illegal drugs.  Dehydration.  Drinking too much water.  Low-sodium diet. SYMPTOMS   Nausea and vomiting.  Confusion.  Lethargy.  Agitation.  Headache.  Twitching or shaking (seizures).  Unconsciousness.  Appetite loss.  Muscle weakness and cramping. DIAGNOSIS  Hyponatremia is identified by a simple blood test. Your caregiver will perform a history and physical exam to try to find the cause and type of hyponatremia. Other tests may be needed to measure the amount of sodium in your blood and urine. TREATMENT  Treatment will depend on the cause.   Fluids may be given through the vein (IV).  Medicines may be used to correct the sodium imbalance. If medicines are causing the problem, they will need to be adjusted.  Water or fluid intake may be restricted to restore proper balance. The speed of correcting the sodium problem is very important. If the problem is corrected too fast, nerve damage (sometimes unchangeable) can happen. HOME CARE INSTRUCTIONS   Only take medicines as directed by your caregiver. Many medicines can make hyponatremia worse. Discuss all your medicines with your caregiver.  Carefully follow any recommended diet, including any fluid restrictions.  You may be asked to repeat lab tests. Follow these  directions.  Avoid alcohol and recreational drugs. SEEK MEDICAL CARE IF:   You develop worsening nausea, fatigue, headache, confusion, or weakness.  Your original hyponatremia symptoms return.  You have problems following the recommended diet. SEEK IMMEDIATE MEDICAL CARE IF:   You have a seizure.  You faint.  You have ongoing diarrhea or vomiting. MAKE SURE YOU:   Understand these instructions.  Will watch your condition.  Will get help right away if you are not doing well or get worse. Document Released: 02/10/2002 Document Revised: 05/15/2011 Document Reviewed: 08/07/2010 [facility] Patient Information 2015 [place], [place]. This information is not intended to replace advice given to you by your health care provider. Make sure you discuss any questions you have with your health care provider.

## 2014-02-27 NOTE — ED Provider Notes (Signed)
CSN: [id number]     Arrival date & time 02/27/14  1937 History   First MD Initiated Contact with Patient 02/27/14 1954     Chief Complaint  Patient presents with  . Emesis    HPI Patient presents to the emergency room with acute onset of nausea associated with vomiting. The symptoms started about an hour prior to arrival. Patient was at home when she started to feel dizzy and lightheaded. She did feel like her head was spinning but she did not feel that the room was moving. Her symptoms did not get worse with position or movement. She went to lie down to see if she would feel any better. Patient does have a history of hyponatremia and in the past she has had similar symptoms when her sodium has been low. The patient took 3 oral sodium tablets on an empty stomach and her symptoms became more severe. Started having numerous episodes of nausea and vomiting.  She does not have a headache.  Denies trouble with blurred vision or slurred speech.  Movement does not increase the symptoms.  No abdominal pain,.  No chest pain or shortness of breath.  No headache.  Past Medical History  Diagnosis Date  . Endometriosis   . 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 [facility]     [facility]  . Peripheral neuropathy     Dr Erling Cruz  . Migraine   . Diverticulosis 2010   Past Surgical History  Procedure Laterality Date  . Appendectomy      high school; low grade appendiceal cancer  . Abdominal hysterectomy      for Endometriosis  . Tubal ligation    . Tonsillectomy    . Colonoscopy  01/2012    Tics; Dr Teena Irani  . Esi       X 3 in 2003 & 02/17/2011 for L4-S1 symptoms  . Lumbar laminectomy  12/2012    [facility] [place]   Family History  Problem Relation Age of Onset  . Alcohol abuse Mother   . Hypertension Mother   . Diabetes Mother   . Stroke Mother 34  . Alzheimer's disease Father   . Cancer Maternal Aunt     pancreatic  . Heart disease Neg Hx    History  Substance Use Topics  . Smoking status:  Never Smoker   . Smokeless tobacco: Not on file  . Alcohol Use: 1.2 oz/week    2 Glasses of wine per week     Comment: 2 glasses/ week   OB History    No data available     Review of Systems  All other systems reviewed and are negative.     Allergies  Sulfonamide derivatives; Demerol; Lactose intolerance (gi); Nsaids; Risedronate sodium; Rofecoxib; Alendronate sodium; Fish allergy; and Benadryl  Home Medications   Prior to Admission medications   Medication Sig Start Date End Date Taking? Authorizing Provider  acetaminophen (TYLENOL) 500 MG tablet Take 1,000 mg by mouth at bedtime as needed for mild pain.   Yes Historical Provider, MD  ASTRAGALUS PO Take 3 tablets by mouth 2 (two) times daily as needed (if feeling like getting a cold).   Yes Historical Provider, MD  baclofen (LIORESAL) 10 MG tablet Take 10 mg by mouth at bedtime as needed for muscle spasms.   Yes Historical Provider, MD  Biotin 5000 MCG TABS Take 1 tablet by mouth daily.   Yes Historical Provider, MD  butalbital-acetaminophen-caffeine (FIORICET WITH CODEINE)  50-325-40-30 MG per capsule Take 1 capsule by mouth every 4 (four) hours as needed for headache or migraine (muscle pain).   Yes Historical Provider, MD  Calcium Citrate-Vitamin D (CITRACAL PETITES/VITAMIN D PO) Take 5-6 tablets by mouth daily.   Yes Historical Provider, MD  cholecalciferol (D-VI-SOL) 400 UNIT/ML LIQD Take 3,000 Units by mouth daily.   Yes Historical Provider, MD  Estradiol Acetate ([employer]) 0.05 MG/24HR RING Place 1 each vaginally every 3 (three) months.   Yes Historical Provider, MD  EVENING PRIMROSE OIL PO Take 2 tablets by mouth daily.   Yes Historical Provider, MD  fexofenadine (ALLEGRA) 180 MG tablet Take 180 mg by mouth daily as needed for allergies or rhinitis.   Yes Historical Provider, MD  FIBER PO Take 2 tablets by mouth 2 (two) times daily.    Yes Historical Provider, MD  gabapentin (NEURONTIN) 100 MG capsule TAKE 3 CAPSULES AT  BEDTIME 12/30/12  Yes Kathrynn Ducking, MD  guaifenesin (HUMIBID E) 400 MG TABS tablet Take 400 mg by mouth every 12 (twelve) hours as needed (for congestion).   Yes Historical Provider, MD  lactase (LACTAID) 3000 UNITS tablet Take 6,000 Units by mouth daily as needed (to help digest diary products).    Yes Historical Provider, MD  loratadine (CLARITIN) 10 MG tablet Take 10 mg by mouth daily.   Yes Historical Provider, MD  Lutein 20 MG TABS Take 1 tablet by mouth daily.   Yes Historical Provider, MD  Lysine 500 MG CAPS Take 1 capsule by mouth 3 (three) times daily.   Yes Historical Provider, MD  magnesium gluconate (MAGONATE) 500 MG tablet Take 500 mg by mouth 2 (two) times daily.   Yes Historical Provider, MD  methocarbamol (ROBAXIN) 750 MG tablet Take 750 mg by mouth 4 (four) times daily.     Yes Historical Provider, MD  Misc Natural Products (YEAST BALANCE ICS) CAPS Take 1-2 capsules by mouth 3 (three) times daily between meals.   Yes Historical Provider, MD  Multiple Vitamin (MULTIVITAMIN WITH MINERALS) TABS tablet Take 1 tablet by mouth daily.   Yes Historical Provider, MD  Multiple Vitamins-Minerals (PRESERVISION AREDS 2) CAPS Take 1 capsule by mouth 2 (two) times daily.   Yes Historical Provider, MD  Omega 3 1000 MG CAPS Take 1,000 mg by mouth 2 (two) times daily.   Yes Historical Provider, MD  Probiotic Product (PROBIOTIC DAILY) CAPS Take 1 capsule by mouth 3 (three) times daily.   Yes Historical Provider, MD  pyridoxine (B-6) 100 MG tablet Take 50 mg by mouth daily.    Yes Historical Provider, MD  rizatriptan (MAXALT-MLT) 10 MG disintegrating tablet Take 10 mg by mouth as needed. May repeat in 2 hours if needed for migraines.   Yes Historical Provider, MD  vitamin E 400 UNIT capsule Take 400 Units by mouth daily.   Yes Historical Provider, MD  AMBULATORY NON FORMULARY MEDICATION Take 1,000 mg by mouth 3 (three) times a week. Vit D oil: 3 drops once daily (1000mg  per drop)    Historical  Provider, MD  aspirin 81 MG tablet Take 1 tablet (81 mg total) by mouth daily. 07/04/12   Rhonda G Barrett, PA-C  Carboxymethylcellul-Glycerin (REFRESH OPTIVE OP) Apply 1 drop to eye 2 (two) times daily.    Historical Provider, MD  erythromycin ophthalmic ointment Place into the left eye every 6 (six) hours. 08/29/12   Hendricks Limes, MD  LIVER EXTRACT PO Take 1 tablet by mouth 3 (three) times daily.  Historical Provider, MD  Multiple Vitamins-Minerals (ICAPS PO) Take 1 capsule by mouth 2 (two) times daily.    Historical Provider, MD  Omega-3 Fatty Acids (OMEGA-3 EPA FISH OIL PO) Take 300-400 mg by mouth 3 (three) times daily. TUNA OIL    Historical Provider, MD  ondansetron (ZOFRAN) 4 MG tablet Take 1 tablet (4 mg total) by mouth every 6 (six) hours. 02/27/14   Dorie Rank, MD  tretinoin (RETIN-A) 0.025 % cream Apply topically at bedtime. Dr.Jones    Historical Provider, MD   BP 152/68 mmHg  Pulse 92  Temp(Src) 97.7 F (36.5 C) (Oral)  Resp 20  SpO2 100% Physical Exam  Constitutional: She is oriented to person, place, and time. She appears well-developed and well-nourished. No distress.  HENT:  Head: Normocephalic and atraumatic.  Right Ear: External ear normal.  Left Ear: External ear normal.  Mouth/Throat: Oropharynx is clear and moist.  Eyes: Conjunctivae are normal. Right eye exhibits no discharge. Left eye exhibits no discharge. No scleral icterus.  Neck: Neck supple. No tracheal deviation present.  Cardiovascular: Normal rate, regular rhythm and intact distal pulses.   Pulmonary/Chest: Effort normal and breath sounds normal. No stridor. No respiratory distress. She has no wheezes. She has no rales.  Abdominal: Soft. Bowel sounds are normal. She exhibits no distension. There is no tenderness. There is no rebound and no guarding.  Musculoskeletal: She exhibits no edema or tenderness.  Neurological: She is alert and oriented to person, place, and time. She has normal strength. No  cranial nerve deficit (No facial droop, extraocular movements intact, tongue midline ) or sensory deficit. She exhibits normal muscle tone. She displays no seizure activity. Coordination normal.  No pronator drift bilateral upper extrem, able to hold both legs off bed for 5 seconds, sensation intact in all extremities, no visual field cuts, no left or right sided neglect, normal finger-nose exam bilaterally, no nystagmus noted   Skin: Skin is warm and dry. No rash noted.  Psychiatric: She has a normal mood and affect.  Nursing note and vitals reviewed.   ED Course  Procedures (including critical care time) Labs Review Labs Reviewed  COMPREHENSIVE METABOLIC PANEL - Abnormal; Notable for the following:    Sodium 130 (*)    Potassium 3.4 (*)    Glucose, Bld 132 (*)    Creatinine, Ser 0.34 (*)    All other components within normal limits  URINALYSIS, ROUTINE W REFLEX MICROSCOPIC - Abnormal; Notable for the following:    APPearance CLOUDY (*)    All other components within normal limits  CBC WITH DIFFERENTIAL  LIPASE, BLOOD    Imaging Review No results found.   EKG Interpretation   Date/Time:  Friday February 27 2014 21:17:23 EST Ventricular Rate:  79 PR Interval:  162 QRS Duration: 106 QT Interval:  405 QTC Calculation: 464 R Axis:   66 Text Interpretation:  Sinus rhythm Probable left atrial enlargement RSR'  in V1 or V2, probably normal variant No significant change since last  tracing Confirmed by Vetra Shinall  MD-J, Anasha Perfecto ([id number]) on 02/27/2014 9:28:47 PM      MDM   Final diagnoses:  Dizziness  Hyponatremia  Nausea    The patient's laboratory tests do show that she is again hyponatremic.  The patient is not lethargic or confused. He has no symptoms to suggest cerebral edema.  Patient does have a history of Mnire's disease. It is possible this contributed to her symptoms of his penis and vomiting. She is not having  any issues with her coordination. No ataxia. I doubt  stroke.  This point the patient is feeling better. She is stable for discharge. I will give her a  prescription for antinausea medications to take at home. Have encouraged her to follow up with her primary doctor to discuss further evaluation of her hyponatremia    Dorie Rank, MD 02/27/14 [id number]

## 2014-03-10 DIAGNOSIS — R634 Abnormal weight loss: Secondary | ICD-10-CM | POA: Diagnosis not present

## 2014-03-17 DIAGNOSIS — R634 Abnormal weight loss: Secondary | ICD-10-CM | POA: Diagnosis not present

## 2014-03-19 DIAGNOSIS — R634 Abnormal weight loss: Secondary | ICD-10-CM | POA: Diagnosis not present

## 2014-03-31 DIAGNOSIS — R634 Abnormal weight loss: Secondary | ICD-10-CM | POA: Diagnosis not present

## 2014-04-01 DIAGNOSIS — H9041 Sensorineural hearing loss, unilateral, right ear, with unrestricted hearing on the contralateral side: Secondary | ICD-10-CM | POA: Diagnosis not present

## 2014-04-01 DIAGNOSIS — H8101 Meniere's disease, right ear: Secondary | ICD-10-CM | POA: Diagnosis not present

## 2014-04-07 DIAGNOSIS — K5790 Diverticulosis of intestine, part unspecified, without perforation or abscess without bleeding: Secondary | ICD-10-CM | POA: Diagnosis not present

## 2014-04-07 DIAGNOSIS — E871 Hypo-osmolality and hyponatremia: Secondary | ICD-10-CM | POA: Diagnosis not present

## 2014-04-07 DIAGNOSIS — H8109 Meniere's disease, unspecified ear: Secondary | ICD-10-CM | POA: Diagnosis not present

## 2014-04-07 DIAGNOSIS — K589 Irritable bowel syndrome without diarrhea: Secondary | ICD-10-CM | POA: Diagnosis not present

## 2014-04-23 DIAGNOSIS — E871 Hypo-osmolality and hyponatremia: Secondary | ICD-10-CM | POA: Diagnosis not present

## 2014-04-29 DIAGNOSIS — G710 Muscular dystrophy: Secondary | ICD-10-CM | POA: Diagnosis not present

## 2014-04-29 DIAGNOSIS — G43009 Migraine without aura, not intractable, without status migrainosus: Secondary | ICD-10-CM | POA: Diagnosis not present

## 2014-04-29 DIAGNOSIS — M549 Dorsalgia, unspecified: Secondary | ICD-10-CM | POA: Diagnosis not present

## 2014-05-12 DIAGNOSIS — I739 Peripheral vascular disease, unspecified: Secondary | ICD-10-CM | POA: Diagnosis not present

## 2014-05-12 DIAGNOSIS — J069 Acute upper respiratory infection, unspecified: Secondary | ICD-10-CM | POA: Diagnosis not present

## 2014-05-12 DIAGNOSIS — Z8669 Personal history of other diseases of the nervous system and sense organs: Secondary | ICD-10-CM | POA: Diagnosis not present

## 2014-05-12 DIAGNOSIS — C4491 Basal cell carcinoma of skin, unspecified: Secondary | ICD-10-CM | POA: Diagnosis not present

## 2014-05-12 DIAGNOSIS — E871 Hypo-osmolality and hyponatremia: Secondary | ICD-10-CM | POA: Diagnosis not present

## 2014-06-09 DIAGNOSIS — L570 Actinic keratosis: Secondary | ICD-10-CM | POA: Diagnosis not present

## 2014-06-09 DIAGNOSIS — L308 Other specified dermatitis: Secondary | ICD-10-CM | POA: Diagnosis not present

## 2014-06-09 DIAGNOSIS — B078 Other viral warts: Secondary | ICD-10-CM | POA: Diagnosis not present

## 2014-06-09 DIAGNOSIS — L853 Xerosis cutis: Secondary | ICD-10-CM | POA: Diagnosis not present

## 2014-06-09 DIAGNOSIS — Z85828 Personal history of other malignant neoplasm of skin: Secondary | ICD-10-CM | POA: Diagnosis not present

## 2014-06-09 DIAGNOSIS — L814 Other melanin hyperpigmentation: Secondary | ICD-10-CM | POA: Diagnosis not present

## 2014-06-18 DIAGNOSIS — E871 Hypo-osmolality and hyponatremia: Secondary | ICD-10-CM | POA: Diagnosis not present

## 2014-10-15 ENCOUNTER — Encounter (HOSPITAL_COMMUNITY): Payer: Self-pay | Admitting: Emergency Medicine

## 2014-10-15 ENCOUNTER — Emergency Department (HOSPITAL_COMMUNITY)
Admission: EM | Admit: 2014-10-15 | Discharge: 2014-10-15 | Disposition: A | Discharge status: Home / Self Care | Payer: Medicare Other | Attending: Emergency Medicine | Admitting: Emergency Medicine

## 2014-10-15 ENCOUNTER — Emergency Department (HOSPITAL_COMMUNITY): Payer: Medicare Other

## 2014-10-15 DIAGNOSIS — R0602 Shortness of breath: Secondary | ICD-10-CM

## 2014-10-15 DIAGNOSIS — G43909 Migraine, unspecified, not intractable, without status migrainosus: Secondary | ICD-10-CM | POA: Insufficient documentation

## 2014-10-15 DIAGNOSIS — Z85828 Personal history of other malignant neoplasm of skin: Secondary | ICD-10-CM | POA: Diagnosis not present

## 2014-10-15 DIAGNOSIS — Z79899 Other long term (current) drug therapy: Secondary | ICD-10-CM | POA: Insufficient documentation

## 2014-10-15 DIAGNOSIS — R002 Palpitations: Secondary | ICD-10-CM | POA: Diagnosis not present

## 2014-10-15 DIAGNOSIS — R072 Precordial pain: Secondary | ICD-10-CM | POA: Insufficient documentation

## 2014-10-15 DIAGNOSIS — Z8742 Personal history of other diseases of the female genital tract: Secondary | ICD-10-CM | POA: Insufficient documentation

## 2014-10-15 LAB — URINALYSIS, ROUTINE W REFLEX MICROSCOPIC
Bilirubin Urine: NEGATIVE
Glucose, UA: NEGATIVE mg/dL
Hgb urine dipstick: NEGATIVE
Ketones, ur: 15 mg/dL — AB
Leukocytes, UA: NEGATIVE
Nitrite: NEGATIVE
PROTEIN: NEGATIVE mg/dL
SPECIFIC GRAVITY, URINE: 1.013 (ref 1.005–1.030)
UROBILINOGEN UA: 0.2 mg/dL (ref 0.0–1.0)
pH: 8 (ref 5.0–8.0)

## 2014-10-15 LAB — CBC WITH DIFFERENTIAL/PLATELET
BASOS ABS: 0 10*3/uL (ref 0.0–0.1)
Basophils Relative: 0 % (ref 0–1)
EOS ABS: 0 10*3/uL (ref 0.0–0.7)
Eosinophils Relative: 1 % (ref 0–5)
HCT: 41.7 % (ref 36.0–46.0)
Hemoglobin: 14.1 g/dL (ref 12.0–15.0)
LYMPHS ABS: 0.8 10*3/uL (ref 0.7–4.0)
Lymphocytes Relative: 12 % (ref 12–46)
MCH: 30.5 pg (ref 26.0–34.0)
MCHC: 33.8 g/dL (ref 30.0–36.0)
MCV: 90.1 fL (ref 78.0–100.0)
Monocytes Absolute: 0.3 10*3/uL (ref 0.1–1.0)
Monocytes Relative: 5 % (ref 3–12)
Neutro Abs: 5.9 10*3/uL (ref 1.7–7.7)
Neutrophils Relative %: 82 % — ABNORMAL HIGH (ref 43–77)
PLATELETS: 285 10*3/uL (ref 150–400)
RBC: 4.63 MIL/uL (ref 3.87–5.11)
RDW: 14.4 % (ref 11.5–15.5)
WBC: 7.1 10*3/uL (ref 4.0–10.5)

## 2014-10-15 LAB — BASIC METABOLIC PANEL
ANION GAP: 9 (ref 5–15)
BUN: 15 mg/dL (ref 6–20)
CHLORIDE: 99 mmol/L — AB (ref 101–111)
CO2: 29 mmol/L (ref 22–32)
CREATININE: 0.56 mg/dL (ref 0.44–1.00)
Calcium: 9.8 mg/dL (ref 8.9–10.3)
GFR calc Af Amer: >60 mL/min (ref >60)
GFR calc non Af Amer: >60 mL/min (ref >60)
Glucose, Bld: 120 mg/dL — ABNORMAL HIGH (ref 65–99)
POTASSIUM: 4.6 mmol/L (ref 3.5–5.1)
SODIUM: 137 mmol/L (ref 135–145)

## 2014-10-15 LAB — BRAIN NATRIURETIC PEPTIDE: B Natriuretic Peptide: 159 pg/mL — ABNORMAL HIGH (ref 0.0–100.0)

## 2014-10-15 LAB — TSH: TSH: 2.762 u[IU]/mL (ref 0.350–4.500)

## 2014-10-15 LAB — I-STAT TROPONIN, ED
Troponin i, poc: 0 ng/mL (ref 0.00–0.08)
Troponin i, poc: 0 ng/mL (ref 0.00–0.08)

## 2014-10-15 MED ORDER — ASPIRIN 81 MG PO CHEW
324.0000 mg | CHEWABLE_TABLET | Freq: Once | ORAL | Status: AC
  Administered 2014-10-15: 324 mg via ORAL
  Filled 2014-10-15: qty 4

## 2014-10-15 MED ORDER — FENTANYL CITRATE (PF) 100 MCG/2ML IJ SOLN: 25.0000 ug | INTRAMUSCULAR | Status: DC | PRN

## 2014-10-15 MED ORDER — ONDANSETRON HCL 4 MG/2ML IJ SOLN: 4.0000 mg | INTRAMUSCULAR | Status: DC | PRN

## 2014-10-15 NOTE — ED Notes (Addendum)
Pt sts palpitations and weakness with some SOB over last several days worse today; pt sts some tingling in both legs and dizziness

## 2014-10-15 NOTE — Discharge Instructions (Signed)

## 2014-10-15 NOTE — ED Provider Notes (Addendum)
CSN: [id number]     Arrival date & time 10/15/14  1520 History   First MD Initiated Contact with Patient 10/15/14 1649     Chief Complaint  Patient presents with  . Palpitations  . Shortness of Breath  . Weakness     (Consider location/radiation/quality/duration/timing/severity/associated sxs/prior Treatment) Patient is a 69 y.o. female presenting with chest pain. The history is provided by the patient.  Chest Pain Pain location:  Substernal area Pain quality: pressure   Pain radiates to:  Does not radiate Pain radiates to the back: no   Pain severity:  Mild Onset quality:  Gradual Duration:  6 hours Timing:  Intermittent Progression:  Waxing and waning Chronicity:  New Associated symptoms: palpitations (2 weeks ago, suddenly, aborted with atenolol) and shortness of breath (intermittently)     Past Medical History  Diagnosis Date  . Endometriosis   . 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 [facility]  . Peripheral neuropathy     Dr Erling Cruz  . Migraine   . Diverticulosis 2010   Past Surgical History  Procedure Laterality Date  . Appendectomy      high school; low grade appendiceal cancer  . Abdominal hysterectomy      for Endometriosis  . Tubal ligation    . Tonsillectomy    . Colonoscopy  01/2012    Tics; Dr Teena Irani  . Esi       X 3 in 2003 & 02/17/2011 for L4-S1 symptoms  . Lumbar laminectomy  12/2012    [facility]S , [place]   Family History  Problem Relation Age of Onset  . Alcohol abuse Mother   . Hypertension Mother   . Diabetes Mother   . Stroke Mother 89  . Alzheimer's disease Father   . Cancer Maternal Aunt     pancreatic  . Heart disease Neg Hx    Social History  Substance Use Topics  . Smoking status: Never Smoker   . Smokeless tobacco: None  . Alcohol Use: 1.2 oz/week    2 Glasses of wine per week     Comment: 2 glasses/ week   OB History    No data available     Review of Systems  Respiratory: Positive for shortness of breath  (intermittently).   Cardiovascular: Positive for chest pain and palpitations (2 weeks ago, suddenly, aborted with atenolol).  All other systems reviewed and are negative.     Allergies  Sulfonamide derivatives; Demerol; Lactose intolerance (gi); Nsaids; Risedronate sodium; Rofecoxib; Alendronate sodium; Fish allergy; and Benadryl  Home Medications   Prior to Admission medications   Medication Sig Start Date End Date Taking? Authorizing Provider  acetaminophen (TYLENOL) 500 MG tablet Take 1,000 mg by mouth at bedtime as needed for mild pain.   Yes Historical Provider, MD  baclofen (LIORESAL) 10 MG tablet Take 10 mg by mouth at bedtime as needed for muscle spasms.   Yes Historical Provider, MD  Biotin 5000 MCG TABS Take 1 tablet by mouth daily.   Yes Historical Provider, MD  butalbital-acetaminophen-caffeine (FIORICET WITH CODEINE) 50-325-40-30 MG per capsule Take 1 capsule by mouth every 4 (four) hours as needed (severe muscle pain).    Yes Historical Provider, MD  Calcium Citrate-Vitamin D (CITRACAL PETITES/VITAMIN D PO) Take 3 tablets by mouth daily.    Yes Historical Provider, MD  Carboxymethylcellul-Glycerin (REFRESH OPTIVE OP) Apply 1 drop to eye 2 (two) times daily.   Yes Historical Provider, MD  Estradiol Acetate (FEMRING) 0.05 MG/24HR RING Place 1 each vaginally every 3 (three) months.   Yes Historical Provider, MD  EVENING PRIMROSE OIL PO Take 1 tablet by mouth 2 (two) times daily.    Yes Historical Provider, MD  fexofenadine (ALLEGRA) 180 MG tablet Take 180 mg by mouth daily as needed for allergies or rhinitis.   Yes Historical Provider, MD  fluocinonide cream (LIDEX) 5.99 % Apply 1 application topically daily as needed (itching).   Yes Historical Provider, MD  gabapentin (NEURONTIN) 100 MG capsule TAKE 3 CAPSULES AT BEDTIME Patient taking differently: TAKE 2 CAPSULES AT BEDTIME 12/30/12  Yes Kathrynn Ducking, MD  guaifenesin (HUMIBID E) 400 MG TABS tablet Take 400 mg by mouth  every 12 (twelve) hours as needed (for congestion).   Yes Historical Provider, MD  lactase (LACTAID) 3000 UNITS tablet Take 6,000 Units by mouth daily as needed (to help digest diary products).    Yes Historical Provider, MD  LIVER EXTRACT PO Take 1 tablet by mouth 3 (three) times daily.    Historical Provider, MD  loratadine (CLARITIN) 10 MG tablet Take 10 mg by mouth daily.   Yes Historical Provider, MD  Lutein 20 MG TABS Take 1 tablet by mouth daily.   Yes Historical Provider, MD  Lysine 500 MG CAPS Take 1 capsule by mouth 3 (three) times daily.    Historical Provider, MD  magnesium gluconate (MAGONATE) 500 MG tablet Take 500 mg by mouth daily.    Yes Historical Provider, MD  methocarbamol (ROBAXIN) 750 MG tablet Take 750 mg by mouth every 6 (six) hours as needed for muscle spasms.    Yes Historical Provider, MD  Misc Natural Products (YEAST BALANCE ICS) CAPS Take 1-2 capsules by mouth 3 (three) times daily between meals.    Historical Provider, MD  Multiple Vitamin (MULTIVITAMIN WITH MINERALS) TABS tablet Take 1 tablet by mouth daily.   Yes Historical Provider, MD  Multiple Vitamins-Minerals (PRESERVISION AREDS 2) CAPS Take 1 capsule by mouth 2 (two) times daily.   Yes Historical Provider, MD  Omega 3 1000 MG CAPS Take 1,000 mg by mouth 2 (two) times daily.   Yes Historical Provider, MD  Omega-3 Fatty Acids (OMEGA-3 EPA FISH OIL PO) Take 300-400 mg by mouth 3 (three) times daily. TUNA OIL    Historical Provider, MD  ondansetron (ZOFRAN) 4 MG tablet Take 1 tablet (4 mg total) by mouth every 6 (six) hours. Patient not taking: Reported on 10/15/2014 02/27/14   Dorie Rank, MD  polyethylene glycol [facility] / Floria Raveling) packet Take 17 g by mouth daily.   Yes Historical Provider, MD  Probiotic Product (PROBIOTIC DAILY) CAPS Take 1 capsule by mouth 3 (three) times daily.    Historical Provider, MD  pyridoxine (B-6) 100 MG tablet Take 50 mg by mouth daily.     Historical Provider, MD  rizatriptan  (MAXALT-MLT) 10 MG disintegrating tablet Take 10 mg by mouth as needed. May repeat in 2 hours if needed for migraines.   Yes Historical Provider, MD  tretinoin (RETIN-A) 0.025 % cream Apply topically at bedtime. Dr.Jones    Historical Provider, MD  vitamin E 400 UNIT capsule Take 400 Units by mouth daily.   Yes Historical Provider, MD   BP 144/59 mmHg  Pulse 77  Temp(Src) 98.2 F (36.8 C) (Oral)  Resp 14  SpO2 100% Physical Exam  Constitutional: She is oriented to person, place, and time. She appears well-developed and well-nourished. No distress.  HENT:  Head: Normocephalic.  Eyes: Conjunctivae are normal.  Neck: Neck supple. No tracheal deviation present.  Cardiovascular: Normal rate, regular rhythm and normal heart sounds.  Exam reveals no gallop and no friction rub.   No murmur heard. Pulmonary/Chest: Effort normal and breath sounds normal. No respiratory distress.  Abdominal: Soft. She exhibits no distension. There is no tenderness.  Neurological: She is alert and oriented to person, place, and time.  Skin: Skin is warm and dry.  Psychiatric: She has a normal mood and affect.    ED Course  Procedures (including critical care time) Labs Review Labs Reviewed  BASIC METABOLIC PANEL - Abnormal; Notable for the following:    Chloride 99 (*)    Glucose, Bld 120 (*)    All other components within normal limits  CBC WITH DIFFERENTIAL/PLATELET - Abnormal; Notable for the following:    Neutrophils Relative % 82 (*)    All other components within normal limits  BRAIN NATRIURETIC PEPTIDE - Abnormal; Notable for the following:    B Natriuretic Peptide 159.0 (*)    All other components within normal limits  URINALYSIS, ROUTINE W REFLEX MICROSCOPIC (NOT AT [facility]) - Abnormal; Notable for the following:    APPearance HAZY (*)    Ketones, ur 15 (*)    All other components within normal limits  TSH  I-STAT TROPOININ, ED  Randolm Idol, ED    Imaging Review Dg Chest 2  View  10/15/2014   CLINICAL DATA:  Shortness of breath.  EXAM: CHEST  2 VIEW  COMPARISON:  Jul 04, 2012.  FINDINGS: The heart size and mediastinal contours are within normal limits. No pneumothorax or pleural effusion is noted. Biapical scarring is noted which is unchanged compared to prior exam. Right upper lobe scarring is noted as well which is unchanged. No acute pulmonary disease is noted. Bony thorax is intact. The visualized skeletal structures are unremarkable.  IMPRESSION: No active cardiopulmonary disease.   Electronically Signed   By: Marijo Conception, M.D.   On: 10/15/2014 16:40   I, Leo Grosser, personally reviewed and evaluated these images and lab results as part of my medical decision-making.   EKG Interpretation   Date/Time:  Thursday October 15 2014 15:30:00 EDT Ventricular Rate:  88 PR Interval:  144 QRS Duration: 100 QT Interval:  364 QTC Calculation: 440 R Axis:   90 Text Interpretation:  Normal sinus rhythm Rightward axis Incomplete right  bundle branch block No significant change since last tracing Confirmed by  Dmetrius Ambs MD, Markeda Narvaez ([id number]) on 10/15/2014 4:49:19 PM      MDM   Final diagnoses:  Palpitations  Shortness of breath  Precordial pain   69 year old feel presents with multiple varied complaints. She had an episode of what she describes as sudden tachycardia to 180 that she has had over a week ago and aborted with beta blockers that she was given by her cardiologist. She states that she has been diagnosed with "nuisance tachycardia" and had one episode of self terminating A. fib during a month of continuous monitoring. She has no history of coronary artery disease and has no risk factors for ACS except for age. While in the waiting room she experienced some chest pressure and has had some shortness of breath especially with exertion during the last week. She has been feeling very weak and is concerned that her sodium is low.  First set of screening labs, including  electrolytes and cardiac enzymes is negative for any acute dysfunction to explain the patient's symptoms. Her EKG  is unchanged from prior and has no ST or T-wave changes to suggest myocardial ischemia.  Second set of enzymes were negative, patient has no evidence of thyroid dysfunction and is asymptomatic, reading a book. She is very concerned that her other doctors will be unable to see her results and I reassured her that she can request to have her records transferred or they can be seen on their computer system. With patient's heart score of 4 given her symptoms I recommended that she pursue outpatient stress testing through her cardiologist non-emergently. Plan to follow up with PCP as needed and return precautions discussed for worsening or new concerning symptoms.   Leo Grosser, MD 10/16/14 Laureen Abrahams

## 2014-10-16 ENCOUNTER — Telehealth: Payer: Self-pay | Admitting: Cardiology

## 2014-10-16 NOTE — Telephone Encounter (Signed)
Discussed with  Dr. Mare Ferrari and will have patient keep appointment with Erik Obey PA Monday

## 2014-10-16 NOTE — Telephone Encounter (Signed)
Pt c/o Shortness Of Breath: STAT if SOB developed within the last 24 hours or pt is noticeably SOB on the phone  1. Are you currently SOB (can you hear that pt is SOB on the phone)?   YES  2. How long have you been experiencing SOB?     10/15/14  3. Are you SOB when sitting or when up moving around?     Both and more with moving  4. Are you currently experiencing any other symptoms?       Heart pressure and fluttering when she walk

## 2014-10-16 NOTE — Telephone Encounter (Signed)
Patient aware.

## 2014-10-19 ENCOUNTER — Ambulatory Visit (INDEPENDENT_AMBULATORY_CARE_PROVIDER_SITE_OTHER): Payer: Medicare Other | Admitting: Physician Assistant

## 2014-10-19 ENCOUNTER — Ambulatory Visit (INDEPENDENT_AMBULATORY_CARE_PROVIDER_SITE_OTHER): Payer: Medicare Other

## 2014-10-19 ENCOUNTER — Encounter: Payer: Self-pay | Admitting: Physician Assistant

## 2014-10-19 VITALS — BP 120/70 | HR 83 | Temp 97.0°F | Ht 66.5 in | Wt 105.0 lb

## 2014-10-19 DIAGNOSIS — I6523 Occlusion and stenosis of bilateral carotid arteries: Secondary | ICD-10-CM

## 2014-10-19 DIAGNOSIS — R0789 Other chest pain: Secondary | ICD-10-CM | POA: Diagnosis not present

## 2014-10-19 DIAGNOSIS — R079 Chest pain, unspecified: Secondary | ICD-10-CM | POA: Insufficient documentation

## 2014-10-19 DIAGNOSIS — R002 Palpitations: Secondary | ICD-10-CM | POA: Diagnosis not present

## 2014-10-19 DIAGNOSIS — I6529 Occlusion and stenosis of unspecified carotid artery: Secondary | ICD-10-CM | POA: Diagnosis not present

## 2014-10-19 MED ORDER — ATENOLOL 25 MG PO TABS: 12.5000 mg | ORAL_TABLET | Freq: Two times a day (BID) | ORAL | Status: DC

## 2014-10-19 NOTE — Assessment & Plan Note (Signed)
Patient is having significant palpitations followed by chest pain. Her electrolytes were all normal in the hospital on Friday. TSH was normal. She does not drink excessive caffeine. We'll place 48-hour monitor. Atenolol 12.5-25 mg daily. Follow-up with Dr. Mare Ferrari in 2 weeks.

## 2014-10-19 NOTE — Assessment & Plan Note (Signed)
Patient is having recurrent chest pain associated with palpitations.EKG unchanged, troponins negative in the emergency room. Because of her muscular dystrophy she cannot walk on treadmill. We'll order Lexi scan Myoview. Follow-up with Dr. Mare Ferrari.

## 2014-10-19 NOTE — Patient Instructions (Signed)
Medication Instructions:  Start Atenolol ( 12.5 mg ) daily, one half tablet  Labwork: -None  Testing/Procedures: Your physician has requested that you have a lexiscan myoview. For further information please visit [email or web address]. Please follow instruction sheet, as given. Your physician has requested that you have a carotid duplex. This test is an ultrasound of the carotid arteries in your neck. It looks at blood flow through these arteries that supply the brain with blood. Allow one hour for this exam. There are no restrictions or special instructions.  Your physician has recommended that you wear a holter monitor. 48 hour for Palpitations. Holter monitors are medical devices that record the heart's electrical activity. Doctors most often use these monitors to diagnose arrhythmias. Arrhythmias are problems with the speed or rhythm of the heartbeat. The monitor is a small, portable device. You can wear one while you do your normal daily activities. This is usually used to diagnose what is causing palpitations/syncope (passing out).        Follow-Up: Your physician recommends that you keep your scheduled follow-up appointment in: 2 weeks with Dr. Mare Ferrari   Any Other Special Instructions Will Be Listed Below (If Applicable).

## 2014-10-19 NOTE — Assessment & Plan Note (Signed)
Patient has very tortuous carotids and had carotid stenosis in 2014. We'll repeat Dopplers.

## 2014-10-19 NOTE — Progress Notes (Signed)
Cardiology Office Note   Date:  10/19/2014   ID:  Alicia [name], DOB [birth year], MRN [id number]  PCP:  Reginia Naas, MD  Cardiologist:  Dr.Brackbill   Chief Complaint:    History of Present Illness: Alicia [name] is a 69 y.o. female who presents for post emergency room follow-up. She has a history of hypotension and hyponatremia. Echo in 2011 showed normal LV systolic function. She has no history of ischemic heart disease. 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 Should have been repeated. She was given him. Beta blocker for symptomatic premature atrial beats.she has muscular dystrophy. She also is followed by Dr.Deterding for kidney disease. She last saw Dr. Mare Ferrari in 2014.she went to the ED on 10/15/14 with sudden tachycardia that she describes at 180 bpm and chest pain. Cardiac enzymes were negative.CBC and BMET were normal.TSH was normal.EKG normal sinus rhythm with incomplete right bundle branch block at 88 bpm with no acute change.  Patient comes in today crying, hyperventilating and complaining of severe pain. Once we laid her down she calm down. Her O2 sats were 99%.She complains of constant fluttering that doesn't go away but  also had pressure into her chest up her neck associated with shortness of breath and significant anxiety. It lasts a long time and only eases when she lays down and tries to calm herself.it has been pretty constant for the past week. Any activity makes it worse.she only drinks 2 cups of tea a day. She drinks Gatorade to stay hydrated.    Past Medical History  Diagnosis Date  . Endometriosis   . 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 [facility]  . Peripheral neuropathy     Dr Erling Cruz  . Migraine   . Diverticulosis 2010    Past Surgical History  Procedure Laterality Date  . Appendectomy      high school; low grade appendiceal cancer  .  Abdominal hysterectomy      for Endometriosis  . Tubal ligation    . Tonsillectomy    . Colonoscopy  01/2012    Tics; Dr Teena Irani  . Esi       X 3 in 2003 & 02/17/2011 for L4-S1 symptoms  . Lumbar laminectomy  12/2012    [facility] , [place]     Current Outpatient Prescriptions  Medication Sig Dispense Refill  . acetaminophen (TYLENOL) 500 MG tablet Take 1,000 mg by mouth at bedtime as needed for mild pain.    . baclofen (LIORESAL) 10 MG tablet Take 10 mg by mouth at bedtime as needed for muscle spasms.    . Biotin 5000 MCG TABS Take 5,000 mcg by mouth daily.     . butalbital-acetaminophen-caffeine (FIORICET WITH CODEINE) 50-325-40-30 MG per capsule Take 1 capsule by mouth every 4 (four) hours as needed (severe muscle pain).     . Calcium Citrate-Vitamin D (CITRACAL PETITES/VITAMIN D PO) Take 3 tablets by mouth daily.     . Carboxymethylcellul-Glycerin (REFRESH OPTIVE OP) Apply 1 drop to eye 2 (two) times daily.    . Cholecalciferol (VITAMIN D PO) Take 2 drops by mouth daily. Vitamin D oil    . Estradiol Acetate (FEMRING) 0.05 MG/24HR RING Place 1 each vaginally every 3 (three) months.    Marland Kitchen EVENING PRIMROSE OIL PO Take 1 tablet by mouth 2 (two) times daily.     . fexofenadine (  ALLEGRA) 180 MG tablet Take 180 mg by mouth daily as needed for allergies or rhinitis.    . fluocinonide cream (LIDEX) 9.83 % Apply 1 application topically daily as needed (itching).    . gabapentin (NEURONTIN) 100 MG capsule Take 100 mg by mouth 2 (two) times daily. BEDTIME    . guaifenesin (HUMIBID E) 400 MG TABS tablet Take 400 mg by mouth every 12 (twelve) hours as needed (for congestion).    . Lactase (LACTAID PO) Take 2 tablets by mouth daily as needed (to help digest dairy products).     . loratadine (CLARITIN) 10 MG tablet Take 10 mg by mouth daily.    . Lutein 20 MG TABS Take 20 mg by mouth daily.     . magnesium gluconate (MAGONATE) 500 MG tablet Take 500 mg by mouth daily.     . methocarbamol (ROBAXIN) 750 MG  tablet Take 750 mg by mouth every 6 (six) hours as needed for muscle spasms.     . Multiple Vitamin (MULTIVITAMIN WITH MINERALS) TABS tablet Take 1 tablet by mouth daily.    . Multiple Vitamins-Minerals (PRESERVISION AREDS 2) CAPS Take 1 capsule by mouth 2 (two) times daily.    . Omega 3 1000 MG CAPS Take 1,000 mg by mouth 2 (two) times daily.    . polyethylene glycol (MIRALAX / GLYCOLAX) packet Take 17 g by mouth daily.    . rizatriptan (MAXALT-MLT) 10 MG disintegrating tablet Take 10 mg by mouth as needed. May repeat in 2 hours if needed for migraines.    . vitamin E 400 UNIT capsule Take 400 Units by mouth daily.    Marland Kitchen atenolol (TENORMIN) 25 MG tablet Take 0.5 tablets (12.5 mg total) by mouth 2 (two) times daily. 30 tablet 6   No current facility-administered medications for this visit.    Allergies:   Sulfonamide derivatives; Demerol; Lactose intolerance (gi); Nsaids; Risedronate sodium; Rofecoxib; Alendronate sodium; Fish allergy; and Benadryl    Social History:  The patient  reports that she has never smoked. She does not have any smokeless tobacco history on file. She reports that she drinks about 1.2 oz of alcohol per week. She reports that she does not use illicit drugs.   Family History:  The patient's    family history includes Alcohol abuse in her mother; Alzheimer's disease in her father; Cancer in her maternal aunt; Diabetes in her mother; Hypertension in her mother; Stroke (age of onset: 42) in her mother. There is no history of Heart disease or Heart attack.    ROS:  Please see the history of present illness.   Otherwise, review of systems are positive for chronic muscle pain and back pain from muscular dystrophy, excessive fatigue, chills.   All other systems are reviewed and negative.    PHYSICAL EXAM: VS:  BP 120/70 mmHg  Pulse 83  Temp(Src) 97 F (36.1 C)  Ht 5' 6.5" (1.689 m)  Wt 105 lb (47.628 kg)  BMI 16.70 kg/m2  SpO2 99% , BMI Body mass index is 16.7  kg/(m^2). GEN: thin, in no acute distress Neck: no JVD, HJR, carotid bruits, or masses Cardiac: RRR; no murmurs,gallop, rubs, thrill or heave,  Respiratory:  clear to auscultation bilaterally, normal work of breathing GI: soft, nontender, nondistended, + BS MS: no deformity or atrophy Extremities: without cyanosis, clubbing, edema, good distal pulses bilaterally.  Skin: warm and dry, no rash Neuro:  Strength and sensation are intact    EKG:  EKG is ordered today.  The ekg ordered today demonstrates normal sinus rhythm incomplete right bundle branch block   Recent Labs: 02/27/2014: ALT 17 10/15/2014: B Natriuretic Peptide 159.0*; BUN 15; Creatinine, Ser 0.56; Hemoglobin 14.1; Platelets 285; Potassium 4.6; Sodium 137; TSH 2.762    Lipid Panel    Component Value Date/Time   CHOL 264* 08/20/2012 1032   TRIG 47.0 08/20/2012 1032   TRIG 46 01/29/2006 1132   HDL 124.60 08/20/2012 1032   CHOLHDL 2 08/20/2012 1032   CHOLHDL 2.4 CALC 01/29/2006 1132   VLDL 9.4 08/20/2012 1032   LDLDIRECT 124.7 08/20/2012 1032   LDLDIRECT 123.4 01/29/2006 1132      Wt Readings from Last 3 Encounters:  10/19/14 105 lb (47.628 kg)  08/29/12 118 lb (53.524 kg)  08/20/12 116 lb (52.617 kg)      Other studies Reviewed: Additional studies/ records that were reviewed today include and review of the records demonstrates:  2-D echo 5/2014Study Conclusions  - Left ventricle: The cavity size was normal. Wall thickness   was normal. Systolic function was normal. The estimated   ejection fraction was in the range of 55% to 65%. Wall   motion was normal; there were no regional wall motion   abnormalities. Left ventricular diastolic function   parameters were normal. - Atrial septum: No defect or patent foramen ovale was   identified. - Pericardium, extracardiac: A trivial pericardial effusion   was identified   ASSESSMENT AND PLAN:  Heart palpitations Patient is having significant palpitations  followed by chest pain. Her electrolytes were all normal in the hospital on Friday. TSH was normal. She does not drink excessive caffeine. We'll place 48-hour monitor. Atenolol 12.5-25 mg daily. Follow-up with Dr. Mare Ferrari in 2 weeks.  Chest pain Patient is having recurrent chest pain associated with palpitations.EKG unchanged, troponins negative in the emergency room. Because of her muscular dystrophy she cannot walk on treadmill. We'll order Lexi scan Myoview. Follow-up with Dr. Mare Ferrari.  Carotid stenosis Patient has very tortuous carotids and had carotid stenosis in 2014. We'll repeat Dopplers.    Sumner Boast, PA-C  10/19/2014 2:49 PM    [facility] [address], [place], [place]  [id number] Phone: [phone]; Fax: [phone]

## 2014-10-20 ENCOUNTER — Ambulatory Visit (HOSPITAL_COMMUNITY)
Admission: RE | Admit: 2014-10-20 | Discharge: 2014-10-20 | Disposition: A | Discharge status: Home / Self Care | Payer: Medicare Other | Source: Ambulatory Visit | Attending: Cardiovascular Disease | Admitting: Cardiovascular Disease

## 2014-10-20 ENCOUNTER — Telehealth (HOSPITAL_COMMUNITY): Payer: Self-pay | Admitting: *Deleted

## 2014-10-20 DIAGNOSIS — R002 Palpitations: Secondary | ICD-10-CM | POA: Diagnosis not present

## 2014-10-20 DIAGNOSIS — R079 Chest pain, unspecified: Secondary | ICD-10-CM | POA: Diagnosis not present

## 2014-10-20 DIAGNOSIS — I6523 Occlusion and stenosis of bilateral carotid arteries: Secondary | ICD-10-CM | POA: Diagnosis not present

## 2014-10-20 DIAGNOSIS — I6529 Occlusion and stenosis of unspecified carotid artery: Secondary | ICD-10-CM

## 2014-10-20 NOTE — Telephone Encounter (Signed)
Patient given detailed instructions per Myocardial Perfusion Study Information Sheet for test on 10/23/14 at [place]. Patient Notified to arrive 15 minutes early, and that it is imperative to arrive on time for appointment to keep from having the test rescheduled. Patient verbalized understanding. Alicia [name], Ranae Palms

## 2014-10-23 ENCOUNTER — Ambulatory Visit (HOSPITAL_COMMUNITY): Payer: Medicare Other | Attending: Cardiology

## 2014-10-23 DIAGNOSIS — R079 Chest pain, unspecified: Secondary | ICD-10-CM

## 2014-10-23 DIAGNOSIS — R002 Palpitations: Secondary | ICD-10-CM | POA: Diagnosis not present

## 2014-10-23 DIAGNOSIS — R0602 Shortness of breath: Secondary | ICD-10-CM | POA: Diagnosis not present

## 2014-10-23 DIAGNOSIS — I6529 Occlusion and stenosis of unspecified carotid artery: Secondary | ICD-10-CM | POA: Diagnosis not present

## 2014-10-23 LAB — MYOCARDIAL PERFUSION IMAGING
CHL CUP NUCLEAR SRS: 6
LHR: 0.38
LV dias vol: 61 mL
LV sys vol: 12 mL
Peak HR: 81 {beats}/min
Rest HR: 64 {beats}/min
SDS: 7
SSS: 13
TID: 0.98

## 2014-10-23 MED ORDER — AMINOPHYLLINE 25 MG/ML IV SOLN
75.0000 mg | Freq: Once | INTRAVENOUS | Status: AC
  Administered 2014-10-23: 75 mg via INTRAVENOUS

## 2014-10-23 MED ORDER — REGADENOSON 0.4 MG/5ML IV SOLN
0.4000 mg | Freq: Once | INTRAVENOUS | Status: AC
  Administered 2014-10-23: 0.4 mg via INTRAVENOUS

## 2014-10-23 MED ORDER — TECHNETIUM TC 99M SESTAMIBI GENERIC - CARDIOLITE
10.3000 | Freq: Once | INTRAVENOUS | Status: AC | PRN
  Administered 2014-10-23: 10 via INTRAVENOUS

## 2014-10-23 MED ORDER — TECHNETIUM TC 99M SESTAMIBI GENERIC - CARDIOLITE
32.5000 | Freq: Once | INTRAVENOUS | Status: AC | PRN
  Administered 2014-10-23: 33 via INTRAVENOUS

## 2014-10-28 ENCOUNTER — Telehealth: Payer: Self-pay | Admitting: *Deleted

## 2014-10-28 NOTE — Telephone Encounter (Signed)
Reported monitor results to  Dr. Mare Ferrari and she will discuss symptoms further with  Dr. Mare Ferrari at [facility] next week.

## 2014-10-28 NOTE — Telephone Encounter (Signed)
-----   Message from Michele M Lenze, PA-C sent at 10/26/2014  7:49 AM EDT ----- Normal stress myoview 

## 2014-10-28 NOTE — Telephone Encounter (Signed)
Please report monitor results

## 2014-10-29 DIAGNOSIS — G710 Muscular dystrophy: Secondary | ICD-10-CM | POA: Diagnosis not present

## 2014-10-29 DIAGNOSIS — G43009 Migraine without aura, not intractable, without status migrainosus: Secondary | ICD-10-CM | POA: Diagnosis not present

## 2014-11-02 ENCOUNTER — Encounter: Payer: Self-pay | Admitting: Cardiology

## 2014-11-02 ENCOUNTER — Ambulatory Visit (INDEPENDENT_AMBULATORY_CARE_PROVIDER_SITE_OTHER): Payer: Medicare Other | Admitting: Cardiology

## 2014-11-02 VITALS — BP 119/60 | HR 76 | Ht 66.5 in | Wt 104.0 lb

## 2014-11-02 DIAGNOSIS — I6523 Occlusion and stenosis of bilateral carotid arteries: Secondary | ICD-10-CM

## 2014-11-02 DIAGNOSIS — R079 Chest pain, unspecified: Secondary | ICD-10-CM

## 2014-11-02 DIAGNOSIS — R002 Palpitations: Secondary | ICD-10-CM | POA: Diagnosis not present

## 2014-11-02 NOTE — Patient Instructions (Signed)
Medication Instructions:  Your physician recommends that you continue on your current medications as directed. Please refer to the Current Medication list given to you today.  Labwork: none  Testing/Procedures: none  Follow-Up: Follow up as needed

## 2014-11-02 NOTE — Progress Notes (Signed)
Cardiology Office Note   Date:  11/02/2014   ID:  Zaleah, Ternes September 07, 1945, MRN [id number]  PCP:  Reginia Naas, MD  Cardiologist: Darlin Coco MD  No chief complaint on file.     History of Present Illness: Alicia [name] is a 69 y.o. female who presents for a follow-up  She was seen by Margarite Gouge several weeks ago for post emergency room follow-up.  She had been seen because of chest pain She has a history of hypotension and hyponatremia. Echo in 2011 showed normal LV systolic function. She has no history of ischemic heart disease. 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 Should have been repeated. She was given him. Beta blocker for symptomatic premature atrial beats.she has muscular dystrophy. She also is followed by Dr.Deterding for kidney disease. She last saw Dr. Mare Ferrari in 2014.she went to the ED on 10/15/14 with sudden tachycardia that she describes at 180 bpm and chest pain. Cardiac enzymes were negative.CBC and BMET were normal.TSH was normal.EKG normal sinus rhythm with incomplete right bundle branch block at 88 bpm with no acute change.  Since last visit she has been feeling better. The patient had a 48 hour Holter monitor on 10/19/14 which did not show any significant arrhythmias. The patient had carotid duplex on 10/20/14 which showed 60-79% stenosis bilaterally which is unchanged and stable in which may be overestimated because of the tortuosity of her vessels.  Recheck in one year. The patient had a Lexiscan Myoview stress test on 10/23/14 which showed ejection fraction 80% and no evidence of ischemia or scar.  It was a normal study. We reviewed her recent studies in detail today.  Her husband was also therefore the discussion.  Her heart has checked out well.  Past Medical History  Diagnosis Date  . Endometriosis   . 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 [facility]      [facility]  . Peripheral neuropathy     Dr Erling Cruz  . Migraine   . Diverticulosis 2010    Past Surgical History  Procedure Laterality Date  . Appendectomy      high school; low grade appendiceal cancer  . Abdominal hysterectomy      for Endometriosis  . Tubal ligation    . Tonsillectomy    . Colonoscopy  01/2012    Tics; Dr Teena Irani  . Esi       X 3 in 2003 & 02/17/2011 for L4-S1 symptoms  . Lumbar laminectomy  12/2012    [facility] , [place]     Current Outpatient Prescriptions  Medication Sig Dispense Refill  . acetaminophen (TYLENOL) 500 MG tablet Take 1,000 mg by mouth at bedtime as needed for mild pain.    . baclofen (LIORESAL) 10 MG tablet Take 10 mg by mouth at bedtime as needed for muscle spasms.    . Biotin 5000 MCG TABS Take 5,000 mcg by mouth daily.     . butalbital-acetaminophen-caffeine (FIORICET WITH CODEINE) 50-325-40-30 MG per capsule Take 1 capsule by mouth every 4 (four) hours as needed (severe muscle pain).     . Calcium Citrate-Vitamin D (CITRACAL PETITES/VITAMIN D PO) Take 3 tablets by mouth daily.     . Carboxymethylcellul-Glycerin (REFRESH OPTIVE OP) Apply 1 drop to eye 2 (two) times daily.    . Cholecalciferol (VITAMIN D PO) Take 2 drops by mouth daily. Vitamin D oil    .  Estradiol Acetate (FEMRING) 0.05 MG/24HR RING Place 1 each vaginally every 3 (three) months.    Marland Kitchen EVENING PRIMROSE OIL PO Take 1 tablet by mouth 2 (two) times daily.     . fexofenadine (ALLEGRA) 180 MG tablet Take 180 mg by mouth daily as needed for allergies or rhinitis.    . fluocinonide cream (LIDEX) 9.37 % Apply 1 application topically daily as needed (itching).    . gabapentin (NEURONTIN) 100 MG capsule Take 100 mg by mouth 2 (two) times daily. BEDTIME    . guaifenesin (HUMIBID E) 400 MG TABS tablet Take 400 mg by mouth every 12 (twelve) hours as needed (for congestion).    . Lactase (LACTAID PO) Take 2 tablets by mouth daily as needed (to help digest dairy products).     .  loratadine (CLARITIN) 10 MG tablet Take 10 mg by mouth daily.    . Lutein 20 MG TABS Take 20 mg by mouth daily.     . magnesium gluconate (MAGONATE) 500 MG tablet Take 500 mg by mouth daily.     . methocarbamol (ROBAXIN) 750 MG tablet Take 750 mg by mouth every 6 (six) hours as needed for muscle spasms.     . Multiple Vitamin (MULTIVITAMIN WITH MINERALS) TABS tablet Take 1 tablet by mouth daily.    . Multiple Vitamins-Minerals (PRESERVISION AREDS 2) CAPS Take 1 capsule by mouth 2 (two) times daily.    . Omega 3 1000 MG CAPS Take 1,000 mg by mouth 2 (two) times daily.    . polyethylene glycol (MIRALAX / GLYCOLAX) packet Take 17 g by mouth daily.    . rizatriptan (MAXALT-MLT) 10 MG disintegrating tablet Take 10 mg by mouth as needed. May repeat in 2 hours if needed for migraines.    . vitamin E 400 UNIT capsule Take 400 Units by mouth daily.     No current facility-administered medications for this visit.    Allergies:   Sulfonamide derivatives; Demerol; Lactose intolerance (gi); Nsaids; Risedronate sodium; Rofecoxib; Alendronate sodium; Fish allergy; and Benadryl    Social History:  The patient  reports that she has never smoked. She does not have any smokeless tobacco history on file. She reports that she drinks about 1.2 oz of alcohol per week. She reports that she does not use illicit drugs.   Family History:  The patient's family history includes Alcohol abuse in her mother; Alzheimer's disease in her father; Cancer in her maternal aunt; Diabetes in her mother; Hypertension in her mother; Stroke (age of onset: 34) in her mother. There is no history of Heart disease or Heart attack.    ROS:  Please see the history of present illness.   Otherwise, review of systems are positive for none.   All other systems are reviewed and negative.    PHYSICAL EXAM: VS:  BP 119/60 mmHg  Pulse 76  Ht 5' 6.5" (1.689 m)  Wt 104 lb (47.174 kg)  BMI 16.54 kg/m2 , BMI Body mass index is 16.54  kg/(m^2). GEN: Well nourished, well developed, in no acute distress HEENT: normal Neck: no JVD, soft bilateral carotid bruits Cardiac: RRR; no murmurs, rubs, or gallops,no edema  Respiratory:  clear to auscultation bilaterally, normal work of breathing GI: soft, nontender, nondistended, + BS MS: no deformity or atrophy Skin: warm and dry, no rash Neuro:  Strength and sensation are intact Psych: euthymic mood, full affect   EKG:  EKG is not ordered today.   Recent Labs: 02/27/2014: ALT 17 10/15/2014: B  Natriuretic Peptide 159.0*; BUN 15; Creatinine, Ser 0.56; Hemoglobin 14.1; Platelets 285; Potassium 4.6; Sodium 137; TSH 2.762    Lipid Panel    Component Value Date/Time   CHOL 264* 08/20/2012 1032   TRIG 47.0 08/20/2012 1032   TRIG 46 01/29/2006 1132   HDL 124.60 08/20/2012 1032   CHOLHDL 2 08/20/2012 1032   CHOLHDL 2.4 CALC 01/29/2006 1132   VLDL 9.4 08/20/2012 1032   LDLDIRECT 124.7 08/20/2012 1032   LDLDIRECT 123.4 01/29/2006 1132      Wt Readings from Last 3 Encounters:  11/02/14 104 lb (47.174 kg)  10/23/14 105 lb (47.628 kg)  10/19/14 105 lb (47.628 kg)         ASSESSMENT AND PLAN: 1.  Palpitations.  Recent 48 hour Holter monitor was unremarkable.  No significant arrhythmias. 2.  Chest pain.  Atypical.  Recent Myoview 10/23/14 was normal. 3.  Bilateral carotid bruits.  Stable, asymptomatic    Current medicines are reviewed at length with the patient today.  The patient does not have concerns regarding medicines.  She did not think that the A. tach below was helping her and after she received the news of her normal cardiac studies she stopped the atenolol.  She has not had any problem since stopping the atenolol.   The following changes have been made:  no change  Labs/ tests ordered today include:  No orders of the defined types were placed in this encounter.     Disposition:  No cardiac cause for her symptoms are found.  As previously discussed, she is  concerned that she might have a pulmonary problem.  I suggested that her PCP might want her to see a pulmonologist.  We will defer that to her PCP.  Recheck here on a as needed basis.  Berna Spare MD 11/02/2014 3:34 PM    [address], [place], [place]  [address] Phone: [phone]; Fax: [phone]

## 2015-01-04 DIAGNOSIS — H9041 Sensorineural hearing loss, unilateral, right ear, with unrestricted hearing on the contralateral side: Secondary | ICD-10-CM | POA: Diagnosis not present

## 2015-01-04 DIAGNOSIS — H8101 Meniere's disease, right ear: Secondary | ICD-10-CM | POA: Diagnosis not present

## 2015-01-11 DIAGNOSIS — E559 Vitamin D deficiency, unspecified: Secondary | ICD-10-CM | POA: Diagnosis not present

## 2015-01-11 DIAGNOSIS — G43909 Migraine, unspecified, not intractable, without status migrainosus: Secondary | ICD-10-CM | POA: Diagnosis not present

## 2015-01-11 DIAGNOSIS — Z79890 Hormone replacement therapy (postmenopausal): Secondary | ICD-10-CM | POA: Diagnosis not present

## 2015-01-11 DIAGNOSIS — Z13220 Encounter for screening for lipoid disorders: Secondary | ICD-10-CM | POA: Diagnosis not present

## 2015-01-11 DIAGNOSIS — Z1389 Encounter for screening for other disorder: Secondary | ICD-10-CM | POA: Diagnosis not present

## 2015-01-11 DIAGNOSIS — G710 Muscular dystrophy: Secondary | ICD-10-CM | POA: Diagnosis not present

## 2015-01-11 DIAGNOSIS — Z0000 Encounter for general adult medical examination without abnormal findings: Secondary | ICD-10-CM | POA: Diagnosis not present

## 2015-02-03 DIAGNOSIS — M8589 Other specified disorders of bone density and structure, multiple sites: Secondary | ICD-10-CM | POA: Diagnosis not present

## 2015-02-22 DIAGNOSIS — H3530 Unspecified macular degeneration: Secondary | ICD-10-CM | POA: Diagnosis not present

## 2015-02-22 DIAGNOSIS — H5203 Hypermetropia, bilateral: Secondary | ICD-10-CM | POA: Diagnosis not present

## 2015-02-22 DIAGNOSIS — H353131 Nonexudative age-related macular degeneration, bilateral, early dry stage: Secondary | ICD-10-CM | POA: Diagnosis not present

## 2015-02-22 DIAGNOSIS — H11151 Pinguecula, right eye: Secondary | ICD-10-CM | POA: Diagnosis not present

## 2015-03-01 DIAGNOSIS — J209 Acute bronchitis, unspecified: Secondary | ICD-10-CM | POA: Diagnosis not present

## 2015-03-09 DIAGNOSIS — Z712 Person consulting for explanation of examination or test findings: Secondary | ICD-10-CM | POA: Diagnosis not present

## 2015-03-09 DIAGNOSIS — M8580 Other specified disorders of bone density and structure, unspecified site: Secondary | ICD-10-CM | POA: Diagnosis not present

## 2015-04-07 DIAGNOSIS — M79674 Pain in right toe(s): Secondary | ICD-10-CM | POA: Diagnosis not present

## 2015-04-07 DIAGNOSIS — S90221A Contusion of right lesser toe(s) with damage to nail, initial encounter: Secondary | ICD-10-CM | POA: Diagnosis not present

## 2015-04-07 DIAGNOSIS — S92404A Nondisplaced unspecified fracture of right great toe, initial encounter for closed fracture: Secondary | ICD-10-CM | POA: Diagnosis not present

## 2015-04-27 DIAGNOSIS — G43009 Migraine without aura, not intractable, without status migrainosus: Secondary | ICD-10-CM | POA: Diagnosis not present

## 2015-04-27 DIAGNOSIS — G710 Muscular dystrophy: Secondary | ICD-10-CM | POA: Diagnosis not present

## 2015-04-29 DIAGNOSIS — M79674 Pain in right toe(s): Secondary | ICD-10-CM | POA: Diagnosis not present

## 2015-04-29 DIAGNOSIS — S90221D Contusion of right lesser toe(s) with damage to nail, subsequent encounter: Secondary | ICD-10-CM | POA: Diagnosis not present

## 2015-04-29 DIAGNOSIS — M2012 Hallux valgus (acquired), left foot: Secondary | ICD-10-CM | POA: Diagnosis not present

## 2015-04-29 DIAGNOSIS — S92424D Nondisplaced fracture of distal phalanx of right great toe, subsequent encounter for fracture with routine healing: Secondary | ICD-10-CM | POA: Diagnosis not present

## 2015-06-09 DIAGNOSIS — Z85828 Personal history of other malignant neoplasm of skin: Secondary | ICD-10-CM | POA: Diagnosis not present

## 2015-06-09 DIAGNOSIS — L821 Other seborrheic keratosis: Secondary | ICD-10-CM | POA: Diagnosis not present

## 2015-06-09 DIAGNOSIS — L820 Inflamed seborrheic keratosis: Secondary | ICD-10-CM | POA: Diagnosis not present

## 2015-06-09 DIAGNOSIS — L565 Disseminated superficial actinic porokeratosis (DSAP): Secondary | ICD-10-CM | POA: Diagnosis not present

## 2015-06-09 DIAGNOSIS — L718 Other rosacea: Secondary | ICD-10-CM | POA: Diagnosis not present

## 2015-06-09 DIAGNOSIS — L570 Actinic keratosis: Secondary | ICD-10-CM | POA: Diagnosis not present

## 2015-06-21 DIAGNOSIS — G710 Muscular dystrophy: Secondary | ICD-10-CM | POA: Diagnosis not present

## 2015-06-21 LAB — PULMONARY FUNCTION TEST

## 2015-07-12 ENCOUNTER — Encounter: Payer: Self-pay | Admitting: Pulmonary Disease

## 2015-07-12 ENCOUNTER — Ambulatory Visit (INDEPENDENT_AMBULATORY_CARE_PROVIDER_SITE_OTHER): Payer: Medicare Other | Admitting: Pulmonary Disease

## 2015-07-12 VITALS — BP 126/72 | HR 69 | Ht 66.5 in | Wt 106.0 lb

## 2015-07-12 DIAGNOSIS — R0600 Dyspnea, unspecified: Secondary | ICD-10-CM | POA: Diagnosis not present

## 2015-07-12 MED ORDER — FLUTTER DEVI: Status: DC

## 2015-07-12 NOTE — Patient Instructions (Signed)
We will set you up with pulmonary function tests and arterial blood gas. We'll also schedule you for an overnight oximetry to check desaturations at night.  Return to clinic after these tests to review and further follow-up as needed.

## 2015-07-12 NOTE — Progress Notes (Signed)
Subjective:    Patient ID: Alicia [name], female    DOB: 1946-01-15, 70 y.o.   MRN: [id number]  HPI Consult for evaluation of lung restriction.  Alicia [name] is a 70 year old with past medical history of limb-girdle muscular dystrophy. She was diagnosed 1982 and symptoms include weakness and back, neck, chest, arms. She has trouble raising up her arm. She follows with neurologic clinic at [facility]. She had spirometry done which showed possible restriction [see below] she's been referred to pulmonary for further evaluation and consideration of noninvasive ventilation.  She is a never smoker with no family history of lung disease. She's had recurrent pneumonias, at least one episode every year. She attributes this to her inability to have forceful cough and clearance of secretions. She does not snore at night, denies any daytime sleepiness.  DATA: Spirometry 06/21/15 FVC 1.88 (58%] FEV1 1.84 (70%]  F/F 91 FEF 25-75 percent 101% NIP 30 Restrictive process suggested by reduction in FVC and FEV1.  CXR 10/15/14 No acute cardiopulmonary disease.  Social History: She is a never smoker, occasional alcohol use, no illegal drug use. 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  Family History: Mother- Alcohol abuse, diabetes, hypertension, stroke. Father- Alzheimers disease.   Past Medical History  Diagnosis Date  . Endometriosis   . Cancer ([place]) 2009    Basal Cell  . Osteopenia     [facility]  . Peripheral neuropathy ([place])     Dr Erling Cruz  . Migraine   . Diverticulosis 2010    Current outpatient prescriptions:  .  acetaminophen (TYLENOL) 500 MG tablet, Take 1,000 mg by mouth at bedtime as needed for mild pain., Disp: , Rfl:  .  baclofen (LIORESAL) 10 MG tablet, Take 10 mg by mouth at bedtime as needed for muscle spasms., Disp: , Rfl:  .  Biotin 5000 MCG TABS, Take 5,000 mcg by mouth daily. , Disp: , Rfl:  .   butalbital-acetaminophen-caffeine (FIORICET WITH CODEINE) 50-325-40-30 MG per capsule, Take 1 capsule by mouth every 4 (four) hours as needed (severe muscle pain). , Disp: , Rfl:  .  Calcium Citrate-Vitamin D (CITRACAL PETITES/VITAMIN D PO), Take 3 tablets by mouth daily. , Disp: , Rfl:  .  Carboxymethylcellul-Glycerin (REFRESH OPTIVE OP), Apply 1 drop to eye 2 (two) times daily., Disp: , Rfl:  .  Cholecalciferol (VITAMIN D PO), Take 2 drops by mouth daily. Vitamin D oil, Disp: , Rfl:  .  Estradiol Acetate (FEMRING) 0.05 MG/24HR RING, Place 1 each vaginally every 3 (three) months., Disp: , Rfl:  .  EVENING PRIMROSE OIL PO, Take 1 tablet by mouth 2 (two) times daily. , Disp: , Rfl:  .  fexofenadine (ALLEGRA) 180 MG tablet, Take 180 mg by mouth daily as needed for allergies or rhinitis., Disp: , Rfl:  .  fluocinonide cream (LIDEX) [id number] %, Apply 1 application topically daily as needed (itching)., Disp: , Rfl:  .  gabapentin (NEURONTIN) 100 MG capsule, Take 100 mg by mouth 2 (two) times daily. BEDTIME, Disp: , Rfl:  .  guaifenesin (HUMIBID E) 400 MG TABS tablet, Take 400 mg by mouth every 12 (twelve) hours as needed (for congestion)., Disp: , Rfl:  .  Lactase (LACTAID PO), Take 2 tablets by mouth daily as needed (to help digest dairy products). , Disp: , Rfl:  .  loratadine (CLARITIN) 10 MG tablet, Take 10 mg by mouth daily., Disp: , Rfl:  .  Lutein 20 MG TABS, Take 20 mg by mouth daily. , Disp: , Rfl:  .  magnesium gluconate (MAGONATE) 500 MG tablet, Take 500 mg by mouth daily. , Disp: , Rfl:  .  methocarbamol (ROBAXIN) 750 MG tablet, Take 750 mg by mouth every 6 (six) hours as needed for muscle spasms. , Disp: , Rfl:  .  Multiple Vitamin (MULTIVITAMIN WITH MINERALS) TABS tablet, Take 1 tablet by mouth daily., Disp: , Rfl:  .  Multiple Vitamins-Minerals (PRESERVISION AREDS 2) CAPS, Take 1 capsule by mouth 2 (two) times daily., Disp: , Rfl:  .  Omega 3 1000 MG CAPS, Take 1,000 mg by mouth 2 (two) times  daily., Disp: , Rfl:  .  polyethylene glycol (MIRALAX / GLYCOLAX) packet, Take 17 g by mouth daily., Disp: , Rfl:  .  rizatriptan (MAXALT-MLT) 10 MG disintegrating tablet, Take 10 mg by mouth as needed. May repeat in 2 hours if needed for migraines., Disp: , Rfl:  .  vitamin E 400 UNIT capsule, Take 400 Units by mouth daily., Disp: , Rfl:   Review of Systems Dyspnea on exertion, denies any cough, congestion, hemoptysis. Denies any chest pain, palpitation. Denies any nausea, vomiting, diarrhea, constipation. Denies any fevers, chills. All other review of systems negative    Objective:   Physical Exam Blood pressure 126/72, pulse 69, height 5' 6.5" (1.689 m), weight 106 lb (48.081 kg), SpO2 99 %. Gen: No apparent distress Neuro: No gross focal deficits. HEENT: No JVD, lymphadenopathy, thyromegaly. RS: Clear, No wheeze or crackles CVS: S1-S2 heard, no murmurs rubs gallops. Abdomen: Soft, positive bowel sounds. Musculoskeletal: No edema.    Assessment & Plan:  Limb-girdle muscular dystrophy Restrictive lung disease  She has progressive weakness of her muscle and possible restriction on spirometry, but the test is of poor quality. She'll need evaluation with a full set of PFTs including lung volume (this further. I'll also get an ABG to check for hypercarbia and overnight oximetry to make sure she is not desaturating at night.  She has pneumonias on an annual basis due to inability to clear secretion. I will give her an incentive spirometry and flutter valve to help with clearance of secretions. I discussed the fact that she may need to go on a noninvasive ventilator. She is not thrilled with this idea and would like to hold off on this for as long as possible. We will continue to monitor closely.  Plan: - PFTs, ABG - Overnight oximetry - Flutter valve,incentive spirometer.   Marshell Garfinkel MD [facility] Pager [phone] If no answer or after 3pm call:  [phone] 07/12/2015, 4:52 PM

## 2015-08-03 ENCOUNTER — Ambulatory Visit (HOSPITAL_COMMUNITY)
Admission: RE | Admit: 2015-08-03 | Discharge: 2015-08-03 | Disposition: A | Discharge status: Home / Self Care | Payer: Medicare Other | Source: Ambulatory Visit | Attending: Pulmonary Disease | Admitting: Pulmonary Disease

## 2015-08-03 DIAGNOSIS — R0600 Dyspnea, unspecified: Secondary | ICD-10-CM | POA: Diagnosis not present

## 2015-08-03 LAB — PULMONARY FUNCTION TEST
DL/VA % pred: 92 %
DL/VA: 4.68 ml/min/mmHg/L
DLCO cor % pred: 68 %
DLCO cor: 18.84 ml/min/mmHg
DLCO unc % pred: 68 %
DLCO unc: 18.95 ml/min/mmHg
FEF 25-75 Post: 4.25 L/sec
FEF 25-75 Pre: 3.99 L/sec
FEF2575-%Change-Post: 6 %
FEF2575-%Pred-Post: 209 %
FEF2575-%Pred-Pre: 196 %
FEV1-%CHANGE-POST: 3 %
FEV1-%Pred-Post: 84 %
FEV1-%Pred-Pre: 81 %
FEV1-POST: 2.11 L
FEV1-Pre: 2.04 L
FEV1FVC-%CHANGE-POST: 0 %
FEV1FVC-%Pred-Pre: 128 %
FEV6-%Change-Post: 3 %
FEV6-%PRED-PRE: 65 %
FEV6-%Pred-Post: 68 %
FEV6-POST: 2.16 L
FEV6-PRE: 2.08 L
FEV6FVC-%PRED-POST: 104 %
FEV6FVC-%Pred-Pre: 104 %
FVC-%Change-Post: 3 %
FVC-%PRED-POST: 65 %
FVC-%PRED-PRE: 63 %
FVC-POST: 2.16 L
FVC-Pre: 2.08 L
POST FEV6/FVC RATIO: 100 %
PRE FEV1/FVC RATIO: 98 %
PRE FEV6/FVC RATIO: 100 %
Post FEV1/FVC ratio: 98 %
RV % PRED: 116 %
RV: 2.7 L
TLC % PRED: 89 %
TLC: 4.88 L

## 2015-08-03 MED ORDER — ALBUTEROL SULFATE (2.5 MG/3ML) 0.083% IN NEBU
2.5000 mg | INHALATION_SOLUTION | Freq: Once | RESPIRATORY_TRACT | Status: AC
  Administered 2015-08-03: 2.5 mg via RESPIRATORY_TRACT

## 2015-08-04 LAB — BLOOD GAS, ARTERIAL
ACID-BASE DEFICIT: 1.1 mmol/L (ref 0.0–2.0)
Bicarbonate: 22.2 mEq/L (ref 20.0–24.0)
Drawn by: 211791
FIO2: 0.21
O2 Saturation: 98.1 %
PCO2 ART: 34.5 mmHg — AB (ref 35.0–45.0)
Patient temperature: 98.6
TCO2: 19.5 mmol/L (ref 0–100)
pH, Arterial: 7.424 (ref 7.350–7.450)
pO2, Arterial: 104 mmHg — ABNORMAL HIGH (ref 80.0–100.0)

## 2015-08-07 DIAGNOSIS — R0600 Dyspnea, unspecified: Secondary | ICD-10-CM | POA: Diagnosis not present

## 2015-08-09 DIAGNOSIS — Z8669 Personal history of other diseases of the nervous system and sense organs: Secondary | ICD-10-CM | POA: Diagnosis not present

## 2015-08-09 DIAGNOSIS — K589 Irritable bowel syndrome without diarrhea: Secondary | ICD-10-CM | POA: Diagnosis not present

## 2015-08-09 DIAGNOSIS — Z79899 Other long term (current) drug therapy: Secondary | ICD-10-CM | POA: Diagnosis not present

## 2015-08-09 DIAGNOSIS — K5790 Diverticulosis of intestine, part unspecified, without perforation or abscess without bleeding: Secondary | ICD-10-CM | POA: Diagnosis not present

## 2015-08-09 DIAGNOSIS — C4491 Basal cell carcinoma of skin, unspecified: Secondary | ICD-10-CM | POA: Diagnosis not present

## 2015-08-09 DIAGNOSIS — G710 Muscular dystrophy: Secondary | ICD-10-CM | POA: Diagnosis not present

## 2015-08-09 DIAGNOSIS — M8580 Other specified disorders of bone density and structure, unspecified site: Secondary | ICD-10-CM | POA: Diagnosis not present

## 2015-08-09 DIAGNOSIS — H8109 Meniere's disease, unspecified ear: Secondary | ICD-10-CM | POA: Diagnosis not present

## 2015-08-09 DIAGNOSIS — G629 Polyneuropathy, unspecified: Secondary | ICD-10-CM | POA: Diagnosis not present

## 2015-08-09 DIAGNOSIS — I739 Peripheral vascular disease, unspecified: Secondary | ICD-10-CM | POA: Diagnosis not present

## 2015-08-09 DIAGNOSIS — E871 Hypo-osmolality and hyponatremia: Secondary | ICD-10-CM | POA: Diagnosis not present

## 2015-08-13 ENCOUNTER — Ambulatory Visit: Payer: Medicare Other | Admitting: Pulmonary Disease

## 2015-08-19 ENCOUNTER — Ambulatory Visit (INDEPENDENT_AMBULATORY_CARE_PROVIDER_SITE_OTHER): Payer: Medicare Other | Admitting: Pulmonary Disease

## 2015-08-19 ENCOUNTER — Encounter: Payer: Self-pay | Admitting: Pulmonary Disease

## 2015-08-19 VITALS — BP 104/70 | HR 80 | Wt 104.2 lb

## 2015-08-19 DIAGNOSIS — R0600 Dyspnea, unspecified: Secondary | ICD-10-CM | POA: Diagnosis not present

## 2015-08-19 NOTE — Patient Instructions (Signed)
Your tests were reviewed with you today. The PFTs and ABG were mostly normal. The overnight oximetry did show some desaturations at night. We'll reevaluate this at your visit 6 months from now.  Continue using the flutter valve for clearance of secretions.

## 2015-08-19 NOTE — Progress Notes (Signed)
Subjective:    Patient ID: Alicia [name], female    DOB: April 19, 1945, 70 y.o.   MRN: [id number]  HPI Follow up for evaluation of lung restriction.  [name] is a 70 year old with past medical history of limb-girdle muscular dystrophy. She was diagnosed 1982 and symptoms include weakness and back, neck, chest, arms. She has trouble raising up her arm. She follows with neurologic clinic at [facility]. She had spirometry done which showed possible restriction [see below] she's been referred to pulmonary for further evaluation and consideration of noninvasive ventilation.  She is a never smoker with no family history of lung disease. She's had recurrent pneumonias, at least one episode every year. She attributes this to her inability to have forceful cough and clearance of secretions. She does not snore at night, denies any daytime sleepiness.  DATA: PFTs 06/21/15 FVC 1.88 (58%] FEV1 1.84 (70%]  F/F 91 FEF 25-75 percent 101% NIP 30 Restrictive process suggested by reduction in FVC and FEV1.  08/03/15 FVC 2.08 (80%) FEV1 2.04 (81%] F/F 98 TLC 89% DLCO 68%.  ABG 08/03/15 7.42/34/104/98%  Imaging CXR 10/15/14 No acute cardiopulmonary disease.  Overnight oximetry 08/10/15 Mean 02 sat-91.5% O2 sat less than 89%-1hour 40 minutes O2 sat less than 88% 1 hour 17 minutes. Periodic desats noted with increase in heart rate.  Social History: She is a never smoker, occasional alcohol use, no illegal drug use. 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  Family History: Mother- Alcohol abuse, diabetes, hypertension, stroke. Father- Alzheimers disease.   Past Medical History  Diagnosis Date  . Endometriosis   . Cancer ([place]) 2009    Basal Cell  . [facility]openia     [facility]  . Peripheral neuropathy ([place])     Dr Erling Cruz  . Migraine   . Diverticulosis 2010    Current outpatient prescriptions:  .  acetaminophen  (TYLENOL) 500 MG tablet, Take 1,000 mg by mouth at bedtime as needed for mild pain., Disp: , Rfl:  .  baclofen (LIORESAL) 10 MG tablet, Take 10 mg by mouth at bedtime as needed for muscle spasms., Disp: , Rfl:  .  Biotin 5000 MCG TABS, Take 5,000 mcg by mouth daily. , Disp: , Rfl:  .  butalbital-acetaminophen-caffeine (FIORICET WITH CODEINE) 50-325-40-30 MG per capsule, Take 1 capsule by mouth every 4 (four) hours as needed (severe muscle pain). , Disp: , Rfl:  .  Calcium Citrate-Vitamin D (CITRACAL PETITES/VITAMIN D PO), Take 3 tablets by mouth daily. , Disp: , Rfl:  .  Carboxymethylcellul-Glycerin (REFRESH OPTIVE OP), Apply 1 drop to eye 2 (two) times daily., Disp: , Rfl:  .  Cholecalciferol (VITAMIN D PO), Take 2 drops by mouth daily. Vitamin D oil, Disp: , Rfl:  .  doxycycline (VIBRAMYCIN) 50 MG capsule, Take 50 mg by mouth 2 (two) times daily., Disp: , Rfl:  .  Estradiol Acetate (FEMRING) 0.05 MG/24HR RING, Place 1 each vaginally every 3 (three) months., Disp: , Rfl:  .  EVENING PRIMROSE OIL PO, Take 1 tablet by mouth 2 (two) times daily. , Disp: , Rfl:  .  fexofenadine (ALLEGRA) 180 MG tablet, Take 180 mg by mouth daily as needed for allergies or rhinitis., Disp: , Rfl:  .  fluocinonide cream (LIDEX) [id number] %, Apply 1 application topically daily as needed (itching)., Disp: , Rfl:  .  gabapentin (NEURONTIN) 100 MG capsule, Take 100 mg by mouth 2 (two) times daily. BEDTIME, Disp: ,  Rfl:  .  guaifenesin (HUMIBID E) 400 MG TABS tablet, Take 400 mg by mouth every 12 (twelve) hours as needed (for congestion)., Disp: , Rfl:  .  Lactase (LACTAID PO), Take 2 tablets by mouth daily as needed (to help digest dairy products). , Disp: , Rfl:  .  loratadine (CLARITIN) 10 MG tablet, Take 10 mg by mouth daily., Disp: , Rfl:  .  Lutein 20 MG TABS, Take 20 mg by mouth daily. , Disp: , Rfl:  .  magnesium gluconate (MAGONATE) 500 MG tablet, Take 500 mg by mouth daily. , Disp: , Rfl:  .  methocarbamol (ROBAXIN) 750  MG tablet, Take 750 mg by mouth every 6 (six) hours as needed for muscle spasms. , Disp: , Rfl:  .  Multiple Vitamin (MULTIVITAMIN WITH MINERALS) TABS tablet, Take 1 tablet by mouth daily., Disp: , Rfl:  .  Multiple Vitamins-Minerals (PRESERVISION AREDS 2) CAPS, Take 1 capsule by mouth 2 (two) times daily., Disp: , Rfl:  .  Omega 3 1000 MG CAPS, Take 1,000 mg by mouth 2 (two) times daily., Disp: , Rfl:  .  polyethylene glycol (MIRALAX / GLYCOLAX) packet, Take 17 g by mouth daily., Disp: , Rfl:  .  Respiratory Therapy Supplies (FLUTTER) DEVI, Use as directed., Disp: 1 each, Rfl: 0 .  rizatriptan (MAXALT-MLT) 10 MG disintegrating tablet, Take 10 mg by mouth as needed. May repeat in 2 hours if needed for migraines., Disp: , Rfl:  .  vitamin E 400 UNIT capsule, Take 400 Units by mouth daily., Disp: , Rfl:   Review of Systems Dyspnea on exertion, denies any cough, congestion, hemoptysis. Denies any chest pain, palpitation. Denies any nausea, vomiting, diarrhea, constipation. Denies any fevers, chills. All other review of systems negative    Objective:   Physical Exam Blood pressure 104/70, pulse 80, weight 104 lb 3.2 oz (47.265 kg), SpO2 98 %. Gen: No apparent distress Neuro: No gross focal deficits. HEENT: No JVD, lymphadenopathy, thyromegaly. RS: Clear, No wheeze or crackles CVS: S1-S2 heard, no murmurs rubs gallops. Abdomen: Soft, positive bowel sounds. Musculoskeletal: No edema.    Assessment & Plan:  Limb-girdle muscular dystrophy Eval for restriction  She was referred for evaluation of restriction on spirometry however a full set of PFTs shows normal lung volume and ABG is normal. We'll continue monitoring her as she has a progressive neuromuscular  Her overnight oximetry shows periodic desaturation suggests sleep apnea. I recommended further evaluation with a sleep study. However she feels that the test was done which was in the middle of a viral infection. She wants to hold off on  the sleep study and reevaluate at next visit.  She has pneumonias on an annual basis due to inability to clear secretion. She will use the incentive spirometry and flutter valve to help with clearance of secretions.   Plan: - Continue Flutter valve,incentive spirometer.   Marshell Garfinkel MD [facility] Pager [phone] If no answer or after 3pm call: [phone] 08/19/2015, 4:02 PM

## 2015-08-20 ENCOUNTER — Telehealth: Payer: Self-pay | Admitting: Pulmonary Disease

## 2015-08-20 NOTE — Telephone Encounter (Signed)
Patient calling back, received call from our office but did not know what for.  Advised her of PFT results.  She said that she went over all of her results with Dr. Vaughan Browner at the last [facility] and does not have any further questions. Nothing further needed.

## 2015-08-20 NOTE — Progress Notes (Signed)
Quick Note:  LMTCB ______ 

## 2015-09-27 ENCOUNTER — Encounter: Payer: Self-pay | Admitting: Pulmonary Disease

## 2015-10-24 ENCOUNTER — Emergency Department (HOSPITAL_COMMUNITY)
Admission: EM | Admit: 2015-10-24 | Discharge: 2015-10-24 | Disposition: A | Discharge status: Home / Self Care | Payer: Medicare Other | Attending: Emergency Medicine | Admitting: Emergency Medicine

## 2015-10-24 ENCOUNTER — Emergency Department (HOSPITAL_COMMUNITY): Payer: Medicare Other

## 2015-10-24 ENCOUNTER — Encounter (HOSPITAL_COMMUNITY): Payer: Self-pay

## 2015-10-24 DIAGNOSIS — Z79899 Other long term (current) drug therapy: Secondary | ICD-10-CM | POA: Diagnosis not present

## 2015-10-24 DIAGNOSIS — R51 Headache: Secondary | ICD-10-CM | POA: Diagnosis not present

## 2015-10-24 DIAGNOSIS — R0600 Dyspnea, unspecified: Secondary | ICD-10-CM

## 2015-10-24 DIAGNOSIS — R413 Other amnesia: Secondary | ICD-10-CM | POA: Diagnosis not present

## 2015-10-24 DIAGNOSIS — J40 Bronchitis, not specified as acute or chronic: Secondary | ICD-10-CM | POA: Diagnosis not present

## 2015-10-24 DIAGNOSIS — R05 Cough: Secondary | ICD-10-CM | POA: Diagnosis not present

## 2015-10-24 DIAGNOSIS — J209 Acute bronchitis, unspecified: Secondary | ICD-10-CM | POA: Insufficient documentation

## 2015-10-24 DIAGNOSIS — R112 Nausea with vomiting, unspecified: Secondary | ICD-10-CM | POA: Insufficient documentation

## 2015-10-24 DIAGNOSIS — Z85828 Personal history of other malignant neoplasm of skin: Secondary | ICD-10-CM | POA: Diagnosis not present

## 2015-10-24 DIAGNOSIS — R519 Headache, unspecified: Secondary | ICD-10-CM

## 2015-10-24 HISTORY — DX: Muscular dystrophy, unspecified: G71.00

## 2015-10-24 LAB — COMPREHENSIVE METABOLIC PANEL
ALK PHOS: 57 U/L (ref 38–126)
ALT: 16 U/L (ref 14–54)
ANION GAP: 8 (ref 5–15)
AST: 29 U/L (ref 15–41)
Albumin: 4.7 g/dL (ref 3.5–5.0)
BUN: 13 mg/dL (ref 6–20)
CO2: 27 mmol/L (ref 22–32)
CREATININE: 0.54 mg/dL (ref 0.44–1.00)
Calcium: 9.6 mg/dL (ref 8.9–10.3)
Chloride: 97 mmol/L — ABNORMAL LOW (ref 101–111)
GFR calc non Af Amer: >60 mL/min (ref >60)
GLUCOSE: 127 mg/dL — AB (ref 65–99)
Potassium: 3.7 mmol/L (ref 3.5–5.1)
SODIUM: 132 mmol/L — AB (ref 135–145)
Total Bilirubin: 0.9 mg/dL (ref 0.3–1.2)
Total Protein: 7.5 g/dL (ref 6.5–8.1)

## 2015-10-24 LAB — I-STAT TROPONIN, ED: Troponin i, poc: 0 ng/mL (ref 0.00–0.08)

## 2015-10-24 LAB — CBC WITH DIFFERENTIAL/PLATELET
Basophils Absolute: 0 10*3/uL (ref 0.0–0.1)
Basophils Relative: 0 %
EOS ABS: 0.1 10*3/uL (ref 0.0–0.7)
Eosinophils Relative: 2 %
HEMATOCRIT: 41 % (ref 36.0–46.0)
Hemoglobin: 13.7 g/dL (ref 12.0–15.0)
LYMPHS ABS: 0.9 10*3/uL (ref 0.7–4.0)
Lymphocytes Relative: 17 %
MCH: 30.2 pg (ref 26.0–34.0)
MCHC: 33.4 g/dL (ref 30.0–36.0)
MCV: 90.5 fL (ref 78.0–100.0)
Monocytes Absolute: 0.3 10*3/uL (ref 0.1–1.0)
Monocytes Relative: 5 %
NEUTROS ABS: 3.9 10*3/uL (ref 1.7–7.7)
NEUTROS PCT: 76 %
Platelets: 293 10*3/uL (ref 150–400)
RBC: 4.53 MIL/uL (ref 3.87–5.11)
RDW: 14.3 % (ref 11.5–15.5)
WBC: 5.2 10*3/uL (ref 4.0–10.5)

## 2015-10-24 LAB — URINALYSIS, ROUTINE W REFLEX MICROSCOPIC
BILIRUBIN URINE: NEGATIVE
GLUCOSE, UA: NEGATIVE mg/dL
Hgb urine dipstick: NEGATIVE
Ketones, ur: NEGATIVE mg/dL
Leukocytes, UA: NEGATIVE
Nitrite: NEGATIVE
Protein, ur: NEGATIVE mg/dL
SPECIFIC GRAVITY, URINE: 1.009 (ref 1.005–1.030)
pH: 7.5 (ref 5.0–8.0)

## 2015-10-24 LAB — LIPASE, BLOOD: Lipase: 27 U/L (ref 11–51)

## 2015-10-24 MED ORDER — SODIUM CHLORIDE 0.9 % IV BOLUS (SEPSIS)
1000.0000 mL | Freq: Once | INTRAVENOUS | Status: AC
  Administered 2015-10-24: 1000 mL via INTRAVENOUS

## 2015-10-24 MED ORDER — PROCHLORPERAZINE EDISYLATE 5 MG/ML IJ SOLN
10.0000 mg | Freq: Once | INTRAMUSCULAR | Status: AC
  Administered 2015-10-24: 10 mg via INTRAVENOUS
  Filled 2015-10-24: qty 2

## 2015-10-24 MED ORDER — OXYMETAZOLINE HCL 0.05 % NA SOLN
1.0000 | Freq: Once | NASAL | Status: AC
  Administered 2015-10-24: 1 via NASAL
  Filled 2015-10-24: qty 15

## 2015-10-24 MED ORDER — IOPAMIDOL (ISOVUE-370) INJECTION 76%
100.0000 mL | Freq: Once | INTRAVENOUS | Status: AC | PRN
  Administered 2015-10-24: 100 mL via INTRAVENOUS

## 2015-10-24 MED ORDER — PROMETHAZINE HCL 25 MG/ML IJ SOLN
12.5000 mg | Freq: Once | INTRAMUSCULAR | Status: AC
  Administered 2015-10-24: 12.5 mg via INTRAVENOUS
  Filled 2015-10-24: qty 1

## 2015-10-24 MED ORDER — AZITHROMYCIN 250 MG PO TABS: ORAL_TABLET | ORAL | 0 refills | Status: DC

## 2015-10-24 MED ORDER — ONDANSETRON 4 MG PO TBDP: ORAL_TABLET | ORAL | 0 refills | Status: DC

## 2015-10-24 MED ORDER — MORPHINE SULFATE (PF) 4 MG/ML IV SOLN
4.0000 mg | Freq: Once | INTRAVENOUS | Status: DC
Start: 2015-10-24 — End: 2015-10-24
  Filled 2015-10-24: qty 1

## 2015-10-24 NOTE — ED Notes (Signed)
Pt c/o increasing headache.  Reports "I think, it might be a migraine.  I have had on and off for the past few days.  I haven't had my migraine medication in the past few days."  Will notify Yao MD.   Also, Pt reports that her memory has returned "a little."

## 2015-10-24 NOTE — Discharge Instructions (Signed)
Stay hydrated.   Take tylenol for headaches.   Take zpack for your cough.   Take zofran for nausea.   Use afrin as needed for at most 3 days for congestion.   See your doctor.   Return to ER if you have worse headaches, vomiting, fevers, passing out.

## 2015-10-24 NOTE — ED Triage Notes (Addendum)
Pt c/o 30-40 minute memory loss after having "a coughing spell and vomiting mucus" around 0830 this morning.  Pt currently c/o nausea and posterior head and neck pain. Pain score 1/10.  Pt does not believe that she passed out.  Pt sts "I thought I was asleep or dreaming."  Pt's husband reports Pt was "walking around confused" when he found her.  Hx of MD.  Sts she had a stomach bug x 3 days ago.

## 2015-10-24 NOTE — ED Notes (Signed)
Patient transported to X-ray 

## 2015-10-24 NOTE — ED Notes (Signed)
Patient requested to be wheeled to the restroom due to being dizzy and weak. Once patient was in restroom patient was able to get up and down from toilet to sink without staff assistance.

## 2015-10-24 NOTE — ED Provider Notes (Signed)
Alicia [name] DEPT Provider Note   CSN: [id number] Arrival date & time: 10/24/15  [id number]     History   Chief Complaint Chief Complaint  Patient presents with  . Memory Loss  . Migraine  . Emesis    HPI Alicia [name] is a 70 y.o. female hx of muscular dystrophy, Peripheral neuropathy, diverticulosis here presenting with headache, loss of consciousness, coughing. Patient states that she had some myalgias and low-grade temperature for the last several days. Also had some nausea as well. She had a coughing spell this morning for about 30 minutes and then vomited. Husband didn't witness that she almost lost consciousness and became amnestic to the event for about 30 minutes. She had some posterior headache at that time now is improving. Denies any fevers. Denies any history of aneurysm but does have a history of migraines. 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 No hx of stroke in the past.   The history is provided by the patient and the spouse.    Past Medical History:  Diagnosis Date  . Cancer ([place]) 2009   Basal Cell  . Diverticulosis 2010  . Endometriosis   . MD (muscular dystrophy) ([place])   . Migraine   . [facility]  . Peripheral neuropathy [facility])    Dr Erling Cruz    Patient Active Problem List   Diagnosis Date Noted  . Chest pain 10/19/2014  . Carotid stenosis 10/19/2014  . Diverticulosis of colon without hemorrhage 08/20/2012  . Heart palpitations 07/04/2012  . Hyponatremia 07/04/2012  . Lumbar radiculopathy 06/03/2012  . 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 Muscular dystrophy ([place]) 11/23/2011  . PARESTHESIA 11/11/2009  . PERIPHERAL NEUROPATHY 01/29/2008  . OSTEOPENIA 01/27/2008  . SKIN CANCER, HX OF 01/27/2008  . GOITER, NONTOXIC MULTINODULAR 10/23/2006  . HYPERLIPIDEMIA NEC/NOS 10/23/2006  . FIBROMYALGIA 10/23/2006     Past Surgical History:  Procedure Laterality Date  . ABDOMINAL HYSTERECTOMY     for Endometriosis  . APPENDECTOMY     high school; low grade appendiceal cancer  . COLONOSCOPY  01/2012   Tics; Dr Teena Irani  . ESI      X 3 in 2003 & 02/17/2011 for L4-S1 symptoms  . LUMBAR LAMINECTOMY  12/2012   W-S , [place]  . TONSILLECTOMY    . TUBAL LIGATION      OB History    No data available       Home Medications    Prior to Admission medications   Medication Sig Start Date End Date Taking? Authorizing Provider  acetaminophen (TYLENOL) 500 MG tablet Take 1,000 mg by mouth every 4 (four) hours as needed for mild pain, moderate pain or headache.    Yes Historical Provider, MD  baclofen (LIORESAL) 10 MG tablet Take 10 mg by mouth at bedtime.    Yes Historical Provider, MD  Biotin 5000 MCG TABS Take 5,000 mcg by mouth daily.    Yes Historical Provider, MD  butalbital-acetaminophen-caffeine (FIORICET, ESGIC) 50-325-40 MG tablet Take 1 tablet by mouth every 4 (four) hours as needed (for severe pain).   Yes Historical Provider, MD  carboxymethylcellulose (REFRESH PLUS) 0.5 % SOLN Place 1 drop into both eyes as needed (for dry eyes).   Yes Historical Provider, MD  cholecalciferol (D-VI-SOL) 400 UNIT/ML LIQD Take 800 Units by mouth daily.   Yes Historical Provider, MD  Dextrose-Fructose-Sod  Citrate (NAUZENE) [phone] MG CHEW Chew 1 tablet by mouth as needed (for nausea).   Yes Historical Provider, MD  Estradiol Acetate ([place]) 0.05 MG/24HR RING Place 1 each vaginally every 3 (three) months.   Yes Historical Provider, MD  Evening Primrose Oil 1000 MG CAPS Take 1,000 mg by mouth daily.   Yes Historical Provider, MD  Fenoprofen Calcium (NALFON) 200 MG CAPS capsule Take 200 mg by mouth 3 (three) times daily.   Yes Historical Provider, MD  gabapentin (NEURONTIN) 100 MG capsule Take 200 mg by mouth at bedtime.    Yes Historical Provider, MD  guaifenesin (HUMIBID E) 400 MG TABS tablet Take 400 mg by  mouth every 4 (four) hours as needed (for chest congestion).   Yes Historical Provider, MD  lactase (LACTAID) 3000 units tablet Take 3,000 Units by mouth 3 (three) times daily as needed (when eating dairy products).   Yes Historical Provider, MD  loratadine (CLARITIN) 10 MG tablet Take 10 mg by mouth daily as needed for allergies.    Yes Historical Provider, MD  Lutein 20 MG TABS Take 20 mg by mouth daily.    Yes Historical Provider, MD  magnesium gluconate (MAGONATE) 500 MG tablet Take 500 mg by mouth at bedtime.    Yes Historical Provider, MD  methocarbamol (ROBAXIN) 750 MG tablet Take 750 mg by mouth every 6 (six) hours as needed for muscle spasms.    Yes Historical Provider, MD  Multiple Vitamin (MULTIVITAMIN WITH MINERALS) TABS tablet Take 1 tablet by mouth daily.   Yes Historical Provider, MD  Multiple Vitamins-Minerals (PRESERVISION AREDS 2) CAPS Take 1 capsule by mouth 2 (two) times daily.   Yes Historical Provider, MD  omega-3 acid ethyl esters (LOVAZA) 1 g capsule Take 1 g by mouth 2 (two) times daily.   Yes Historical Provider, MD  polyethylene glycol (MIRALAX / GLYCOLAX) packet Take 17 g by mouth daily.   Yes Historical Provider, MD  rizatriptan (MAXALT-MLT) 10 MG disintegrating tablet Take 10 mg by mouth as needed for migraine.    Yes Historical Provider, MD  tetrahydrozoline 0.05 % ophthalmic solution Place 1 drop into both eyes as needed (for eye redness).   Yes Historical Provider, MD  vitamin E 400 UNIT capsule Take 400 Units by mouth daily.   Yes Historical Provider, MD    Family History Family History  Problem Relation Age of Onset  . Alcohol abuse Mother   . Hypertension Mother   . Diabetes Mother   . Stroke Mother 27  . Alzheimer's disease Father   . Cancer Maternal Aunt     pancreatic  . Heart disease Neg Hx   . Heart attack Neg Hx     Social History Social History  Substance Use Topics  . Smoking status: Never Smoker  . Smokeless tobacco: Never Used  . Alcohol  use 1.2 oz/week    2 Glasses of wine per week     Comment: 2 glasses/ week     Allergies   Sulfonamide derivatives; Demerol; Lactose intolerance (gi); Nsaids; Risedronate sodium; Rofecoxib; Alendronate sodium; Aspirin; Fish allergy; and Benadryl [diphenhydramine hcl]   Review of Systems Review of Systems  Respiratory: Positive for cough.   Gastrointestinal: Positive for vomiting.  All other systems reviewed and are negative.    Physical Exam Updated Vital Signs BP 148/69   Pulse 81   Temp 98.6 F (37 C) (Oral)   Resp 16   Ht 5\' 6"  (1.676 m)   Wt 105 lb (47.6  kg)   SpO2 98%   BMI 16.95 kg/m   Physical Exam  Constitutional: She is oriented to person, place, and time.  Chronically ill, uncomfortable   HENT:  Head: Normocephalic and atraumatic.  Mouth/Throat: Oropharynx is clear and moist.  Eyes: EOM are normal. Pupils are equal, round, and reactive to light.  Neck: Normal range of motion. Neck supple.  Cardiovascular: Normal rate, regular rhythm and normal heart sounds.   Pulmonary/Chest: Effort normal and breath sounds normal. No respiratory distress. She has no wheezes. She has no rales.  Abdominal: Soft. Bowel sounds are normal. She exhibits no distension. There is no tenderness. There is no guarding.  Musculoskeletal: Normal range of motion.  Neurological: She is alert and oriented to person, place, and time.  CN 2-12 intact. Nl strength throughout. ? L dysmetria. Nl sensation throughout   Skin: Skin is warm.  Psychiatric: She has a normal mood and affect.  Nursing note and vitals reviewed.    ED Treatments / Results  Labs (all labs ordered are listed, but only abnormal results are displayed) Labs Reviewed  COMPREHENSIVE METABOLIC PANEL - Abnormal; Notable for the following:       Result Value   Sodium 132 (*)    Chloride 97 (*)    Glucose, Bld 127 (*)    All other components within normal limits  CBC WITH DIFFERENTIAL/PLATELET  URINALYSIS, ROUTINE W  REFLEX MICROSCOPIC (NOT AT [facility])  LIPASE, BLOOD  I-STAT TROPOININ, ED    EKG  EKG Interpretation  Date/Time:  Sunday October 24 2015 09:50:10 EDT Ventricular Rate:  82 PR Interval:    QRS Duration: 104 QT Interval:  378 QTC Calculation: 442 R Axis:   66 Text Interpretation:  Sinus rhythm Biatrial enlargement RSR' in V1 or V2, probably normal variant Nonspecific T abnrm, anterolateral leads No significant change since last tracing Confirmed by Richard Ritchey  MD, Smokey Melott ([id number]) on 10/24/2015 9:53:40 AM       Radiology Dg Chest 2 View  Result Date: 10/24/2015 CLINICAL DATA:  Cough with transient memory loss EXAM: CHEST  2 VIEW COMPARISON:  October 15, 2014 FINDINGS: There is stable upper lobe/apex scarring. There is no edema or consolidation. Heart size and pulmonary vascularity are normal. There is atherosclerotic calcification in the aortic arch. There are calcified breast implants bilaterally. There is no evident adenopathy. There is thoracolumbar dextroscoliosis. IMPRESSION: Scarring in the upper lobe/apical regions. No edema or consolidation. Stable cardiac silhouette. Aortic atherosclerosis. Electronically Signed   By: Lowella Grip III M.D.   On: 10/24/2015 10:13   Mr Brain Wo Contrast  Result Date: 10/24/2015 CLINICAL DATA:  Memory loss which developed after a coughing spell and vomiting mucus. Nausea with posterior head and neck pain. EXAM: MRI HEAD WITHOUT CONTRAST TECHNIQUE: Multiplanar, multiecho pulse sequences of the brain and surrounding structures were obtained without intravenous contrast. COMPARISON:  None. FINDINGS: No evidence for acute infarction, hemorrhage, mass lesion, hydrocephalus, or extra-axial fluid. Mild cerebral and cerebellar atrophy. Moderate T2 and FLAIR hyperintensities throughout the white matter, also involving the brainstem, representing chronic microvascular ischemic change. Pituitary, pineal, and cerebellar tonsils unremarkable. No upper cervical lesions. Flow  voids are maintained throughout the carotid, basilar, and vertebral arteries. There are no areas of chronic hemorrhage. Visualized calvarium, skull base, and upper cervical osseous structures unremarkable. Scalp and extracranial soft tissues, orbits, sinuses, and mastoids show no acute process. IMPRESSION: Mild Atrophy with moderate chronic microvascular ischemic change. No acute intracranial findings. Electronically Signed   By: Madie Reno  Curnes M.D.   On: 10/24/2015 12:32    Procedures Procedures (including critical care time)  Medications Ordered in ED Medications  morphine 4 MG/ML injection 4 mg (4 mg Intravenous Refused 10/24/15 1223)  oxymetazoline (AFRIN) 0.05 % nasal spray 1 spray (not administered)  sodium chloride 0.9 % bolus 1,000 mL (0 mLs Intravenous Stopped 10/24/15 1158)  promethazine (PHENERGAN) injection 12.5 mg (12.5 mg Intravenous Given 10/24/15 1037)  prochlorperazine (COMPAZINE) injection 10 mg (10 mg Intravenous Given 10/24/15 1225)  iopamidol (ISOVUE-370) 76 % injection 100 mL (100 mLs Intravenous Contrast Given 10/24/15 1240)     Initial Impression / Assessment and Plan / ED Course  I have reviewed the triage vital signs and the nursing notes.  Pertinent labs & imaging results that were available during my care of the patient were reviewed by me and considered in my medical decision making (see chart for details).  Clinical Course    THAIS FOOT is a 70 y.o. female here with cough, vomiting, LOC, headache. I think headache is likely complex migraine from cough and vomiting. Given ? L dysmetria and she has stroke risk factors, will get MRI. Will not need TPA currently. Consider aneurysm as well but symptoms less than 6 hrs so if MRI neg will not need LP or angiogram. Will get CXR to r/o pneumonia and labs as well. Will give migraine cocktail.   1:28 PM Labs at baseline. CXR and UA unremarkable. MRI brain showed no stroke or obvious subarachnoid. Had some headaches  after MRI, given compazine now has sinus congestion but headache resolved. Neuro exam remains unremarkable. Likely complex migraine. Offered to dc home with phenergan or compazine but she state that the sedate her too much and wants zofran as needed. Will give zpack for possible bronchitis given cough and poor respiratory function at baseline.    Final Clinical Impressions(s) / ED Diagnoses   Final diagnoses:  Dyspnea    New Prescriptions New Prescriptions   No medications on file     Drenda Freeze, MD 10/24/15 1330

## 2015-10-24 NOTE — ED Notes (Signed)
Patient transported to MRI 

## 2015-10-24 NOTE — ED Notes (Signed)
Verbalized understanding discharge instructions, prescriptions, and follow-up. In no acute distress.   

## 2015-10-25 DIAGNOSIS — G710 Muscular dystrophy: Secondary | ICD-10-CM | POA: Diagnosis not present

## 2015-10-25 DIAGNOSIS — G43009 Migraine without aura, not intractable, without status migrainosus: Secondary | ICD-10-CM | POA: Diagnosis not present

## 2015-10-26 ENCOUNTER — Other Ambulatory Visit: Payer: Self-pay | Admitting: Physician Assistant

## 2015-10-26 ENCOUNTER — Ambulatory Visit (HOSPITAL_COMMUNITY)
Admission: RE | Admit: 2015-10-26 | Discharge: 2015-10-26 | Disposition: A | Discharge status: Home / Self Care | Payer: Medicare Other | Source: Ambulatory Visit | Attending: Cardiovascular Disease | Admitting: Cardiovascular Disease

## 2015-10-26 DIAGNOSIS — I6523 Occlusion and stenosis of bilateral carotid arteries: Secondary | ICD-10-CM

## 2015-12-17 ENCOUNTER — Encounter: Payer: Self-pay | Admitting: Adult Health

## 2015-12-17 ENCOUNTER — Ambulatory Visit (INDEPENDENT_AMBULATORY_CARE_PROVIDER_SITE_OTHER): Payer: Medicare Other | Admitting: Adult Health

## 2015-12-17 DIAGNOSIS — I6523 Occlusion and stenosis of bilateral carotid arteries: Secondary | ICD-10-CM | POA: Diagnosis not present

## 2015-12-17 DIAGNOSIS — J208 Acute bronchitis due to other specified organisms: Secondary | ICD-10-CM

## 2015-12-17 MED ORDER — CEFDINIR 300 MG PO CAPS: 300.0000 mg | ORAL_CAPSULE | Freq: Two times a day (BID) | ORAL | 0 refills | Status: DC

## 2015-12-17 NOTE — Patient Instructions (Addendum)
Omnicef 300mg  Twice daily  For 7 days take w/ food Eat yogurt while on antibiotics.  Mucinex DM Twice daily  As needed  With flutter valve  Fluids and rest.  Please contact office for sooner follow up if symptoms do not improve or worsen or seek emergency care

## 2015-12-17 NOTE — Progress Notes (Signed)
Subjective:    Patient ID: Alicia [name], female    DOB: [birth year], 70 y.o.   MRN: [id number]  HPI 70 yo female never smoker with known limb girdle muscular dystrophy (dx in 1982-sx include with weakness in back, neck , chest , arms) Followed at [facility]. Seen for pulmonary consultation in 08/2015 for restrictive lung dz and consideration of Noninvastive ventilation  TEST  DATA: PFTs 06/21/15 FVC 1.88 (58%] FEV1 1.84 (70%]  F/F 91 FEF 25-75 percent 101% NIP 30 Restrictive process suggested by reduction in FVC and FEV1.  08/03/15 FVC 2.08 (80%) FEV1 2.04 (81%] F/F 98 TLC 89% DLCO 68%.  ABG 08/03/15 7.42/34/104/98%  Imaging CXR 10/15/14 No acute cardiopulmonary disease. Chest x-ray 10/24/2015 showed scarring in the upper lobe and apical regions.  Overnight oximetry 08/10/15 Mean 02 sat-91.5% O2 sat less than 89%-1hour 40 minutes O2 sat less than 88% 1 hour 17 minutes. Periodic desats noted with increase in heart rate.   12/17/15 Acute OV  Patient presents for an acute office visit. She complains over last several days, that she's had increased coughing episodes. She is afraid that she is going to develop a bronchitis or pneumonia. He denies any fever, hemoptysis, chest pain, orthopnea, PND, or increased leg swelling. Is using her flutter valve and incentive spirometer. PFT May 2017 showed minimum restriction with FVC of 80% and FEV1 81% and total lung capacity 89%. ABG did not show any significant hypercarbia. It was felt noninvasive ventilation support was not  needed at this time.  She did have some nocturnal desaturations and recommended for a sleep study. However, she wanted to hold off on that. Patient was seen for a pulmonary consult June 2017 for restrictive lung disease and consideration for noninvasive ventilation.  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   Past Medical History:  Diagnosis Date  . 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 Diverticulosis  2010  . Endometriosis   . MD (muscular dystrophy) ([place])   . Migraine   . [facility]  . Peripheral neuropathy [facility])    Dr Erling Cruz   Current Outpatient Prescriptions on File Prior to Visit  Medication Sig Dispense Refill  . acetaminophen (TYLENOL) 500 MG tablet Take 1,000 mg by mouth every 4 (four) hours as needed for mild pain, moderate pain or headache.     . baclofen (LIORESAL) 10 MG tablet Take 10 mg by mouth at bedtime.     . Biotin 5000 MCG TABS Take 5,000 mcg by mouth daily.     . butalbital-acetaminophen-caffeine (FIORICET, ESGIC) 50-325-40 MG tablet Take 1 tablet by mouth every 4 (four) hours as needed (for severe pain).    . carboxymethylcellulose (REFRESH PLUS) 0.5 % SOLN Place 1 drop into both eyes as needed (for dry eyes).    . cholecalciferol (D-VI-SOL) 400 UNIT/ML LIQD Take 800 Units by mouth daily.    Marland Kitchen Dextrose-Fructose-Sod Citrate (NAUZENE) [phone] MG CHEW Chew 1 tablet by mouth as needed (for nausea).    . Estradiol Acetate (FEMRING) 0.05 MG/24HR RING Place 1 each vaginally every 3 (three) months.    . Evening Primrose Oil 1000 MG CAPS Take 1,000 mg by mouth daily.    . Fenoprofen Calcium (NALFON) 200 MG CAPS capsule Take 200 mg by mouth 3 (three) times daily.    Marland Kitchen gabapentin (NEURONTIN) 100 MG capsule Take 200 mg by mouth at bedtime.     Marland Kitchen guaifenesin (HUMIBID E) 400 MG  TABS tablet Take 400 mg by mouth every 4 (four) hours as needed (for chest congestion).    Marland Kitchen lactase (LACTAID) 3000 units tablet Take 3,000 Units by mouth 3 (three) times daily as needed (when eating dairy products).    . loratadine (CLARITIN) 10 MG tablet Take 10 mg by mouth daily as needed for allergies.     . Lutein 20 MG TABS Take 20 mg by mouth daily.     . magnesium gluconate (MAGONATE) 500 MG tablet Take 500 mg by mouth at bedtime.     . methocarbamol (ROBAXIN) 750 MG tablet Take 750 mg by mouth every 6 (six) hours as needed for muscle spasms.     . Multiple Vitamin  (MULTIVITAMIN WITH MINERALS) TABS tablet Take 1 tablet by mouth daily.    . Multiple Vitamins-Minerals (PRESERVISION AREDS 2) CAPS Take 1 capsule by mouth 2 (two) times daily.    Marland Kitchen omega-3 acid ethyl esters (LOVAZA) 1 g capsule Take 1 g by mouth 2 (two) times daily.    . ondansetron (ZOFRAN ODT) 4 MG disintegrating tablet 4mg  ODT q6 hours prn nausea/vomit 10 tablet 0  . polyethylene glycol (MIRALAX / GLYCOLAX) packet Take 17 g by mouth daily.    . rizatriptan (MAXALT-MLT) 10 MG disintegrating tablet Take 10 mg by mouth as needed for migraine.     Marland Kitchen tetrahydrozoline 0.05 % ophthalmic solution Place 1 drop into both eyes as needed (for eye redness).    . vitamin E 400 UNIT capsule Take 400 Units by mouth daily.     No current facility-administered medications on file prior to visit.      Review of Systems Constitutional:   No  weight loss, night sweats,  Fevers, chills, fatigue, or  lassitude.  HEENT:   No headaches,  Difficulty swallowing,  Tooth/dental problems, or  Sore throat,                No sneezing, itching, ear ache, nasal congestion, post nasal drip,   CV:  No chest pain,  Orthopnea, PND, swelling in lower extremities, anasarca, dizziness, palpitations, syncope.   GI  No heartburn, indigestion, abdominal pain, nausea, vomiting, diarrhea, change in bowel habits, loss of appetite, bloody stools.   Resp:   No chest wall deformity  Skin: no rash or lesions.  GU: no dysuria, change in color of urine, no urgency or frequency.  No flank pain, no hematuria   MS:  No joint pain or swelling.  No decreased range of motion.  No back pain.  Psych:  No change in mood or affect. No depression or anxiety.  No memory loss.         Objective:   Physical Exam . Vitals:   12/17/15 1638  BP: 118/70  Pulse: 81  SpO2: 100%  Weight: 107 lb (48.5 kg)  Height: 5\' 6"  (1.676 m)   GEN: A/Ox3; [place] , NAD, elderly , thin    HEENT:  [place]/AT,  EACs-clear, TMs-wnl, NOSE-clear,  THROAT-clear, no lesions, no postnasal drip or exudate noted.   NECK:  Supple w/ fair ROM; no JVD; normal carotid impulses w/o bruits; no thyromegaly or nodules palpated; no lymphadenopathy.    RESP  Decreased BS in bases ,  w/o, wheezes/ rales/ or rhonchi. no accessory muscle use, no dullness to percussion  CARD:  RRR, no m/r/g  , no peripheral edema, pulses intact, no cyanosis or clubbing.  GI:   Soft & nt; nml bowel sounds; no organomegaly or masses detected.  Musco: Warm bil, no deformities or joint swelling noted.   Neuro: alert, no focal deficits noted.    Skin: Warm, no lesions or rashes  Lil Lepage NP-C  [facility]  12/17/2015        Assessment & Plan:

## 2015-12-21 DIAGNOSIS — J209 Acute bronchitis, unspecified: Secondary | ICD-10-CM | POA: Insufficient documentation

## 2015-12-21 NOTE — Assessment & Plan Note (Addendum)
URI /Bronchitis  Cont to use flutter and IS for mucus clearance   Plan  Patient Instructions  Omnicef 300mg  Twice daily  For 7 days take w/ food Eat yogurt while on antibiotics.  Mucinex DM Twice daily  As needed  With flutter valve  Fluids and rest.  Please contact office for sooner follow up if symptoms do not improve or worsen or seek emergency care

## 2016-02-02 DIAGNOSIS — H353131 Nonexudative age-related macular degeneration, bilateral, early dry stage: Secondary | ICD-10-CM | POA: Diagnosis not present

## 2016-02-02 DIAGNOSIS — H3530 Unspecified macular degeneration: Secondary | ICD-10-CM | POA: Diagnosis not present

## 2016-02-02 DIAGNOSIS — H5203 Hypermetropia, bilateral: Secondary | ICD-10-CM | POA: Diagnosis not present

## 2016-02-02 DIAGNOSIS — H52223 Regular astigmatism, bilateral: Secondary | ICD-10-CM | POA: Diagnosis not present

## 2016-02-15 ENCOUNTER — Ambulatory Visit (INDEPENDENT_AMBULATORY_CARE_PROVIDER_SITE_OTHER): Payer: Medicare Other | Admitting: Pulmonary Disease

## 2016-02-15 ENCOUNTER — Encounter: Payer: Self-pay | Admitting: Pulmonary Disease

## 2016-02-15 VITALS — BP 104/62 | HR 83 | Ht 64.25 in | Wt 107.0 lb

## 2016-02-15 DIAGNOSIS — I6523 Occlusion and stenosis of bilateral carotid arteries: Secondary | ICD-10-CM | POA: Diagnosis not present

## 2016-02-15 DIAGNOSIS — R0600 Dyspnea, unspecified: Secondary | ICD-10-CM

## 2016-02-15 NOTE — Progress Notes (Signed)
Alicia [name]    [id number]    1945/09/12  Primary Care Physician:SMITH,CANDACE Weyman Croon, MD  Referring Physician: Carol Ada, MD [address], [place] [address]  Chief complaint:   Follow up for evaluation of lung restriction Limb girdle muscular dystrophy  HPI: [name] is a 70 year old with past medical history of limb-girdle muscular dystrophy. She was diagnosed 1982 and symptoms include weakness and back, neck, chest, arms. She has trouble raising up her arm. She follows with neurologic clinic at [facility]. She had spirometry done which showed possible restriction [see below] she's been referred to pulmonary for further evaluation and consideration of noninvasive ventilation.  She is a never smoker with no family history of lung disease. She's had recurrent pneumonias, at least one episode every year. She attributes this to her inability to have forceful cough and clearance of secretions. She does not snore at night, denies any daytime sleepiness.  Interim history: She had another episode of bronchitis in October 2017. This was treated with a course of antidepressant is. She feels back to baseline now. She feels that she has recurrent pneumonia because she was exposed to cotton dust and fiber while quilting. She plans on wearing a mask while she quilts next.   Outpatient Encounter Prescriptions as of 02/15/2016  Medication Sig  . acetaminophen (TYLENOL) 500 MG tablet Take 1,000 mg by mouth at bedtime.   . baclofen (LIORESAL) 10 MG tablet Take 10 mg by mouth at bedtime.   . Biotin 5000 MCG TABS Take 5,000 mcg by mouth daily.   . butalbital-acetaminophen-caffeine (FIORICET, ESGIC) 50-325-40 MG tablet Take 1 tablet by mouth every 4 (four) hours as needed (for severe pain).  . carboxymethylcellulose (REFRESH PLUS) 0.5 % SOLN Place 1 drop into both eyes as needed (for dry eyes).  . cholecalciferol (D-VI-SOL) 400 UNIT/ML LIQD  Take 800 Units by mouth daily.  . Estradiol Acetate (FEMRING) 0.05 MG/24HR RING Place 1 each vaginally every 3 (three) months.  . Evening Primrose Oil 1000 MG CAPS Take 1,000 mg by mouth daily.  . Fenoprofen Calcium (NALFON) 200 MG CAPS capsule Take 200 mg by mouth 3 (three) times daily.  Marland Kitchen gabapentin (NEURONTIN) 100 MG capsule Take 200 mg by mouth at bedtime.   Marland Kitchen guaifenesin (HUMIBID E) 400 MG TABS tablet Take 400 mg by mouth every 4 (four) hours as needed (for chest congestion).  Marland Kitchen lactase (LACTAID) 3000 units tablet Take 3,000 Units by mouth 3 (three) times daily as needed (when eating dairy products).  . loratadine (CLARITIN) 10 MG tablet Take 10 mg by mouth daily as needed for allergies.   . Lutein 20 MG TABS Take 20 mg by mouth daily.   . magnesium gluconate (MAGONATE) 500 MG tablet Take 500 mg by mouth at bedtime.   . methocarbamol (ROBAXIN) 750 MG tablet Take 750 mg by mouth every 6 (six) hours as needed for muscle spasms.   . Multiple Vitamin (MULTIVITAMIN WITH MINERALS) TABS tablet Take 1 tablet by mouth daily.  . Multiple Vitamins-Minerals (PRESERVISION AREDS 2) CAPS Take 1 capsule by mouth 2 (two) times daily.  Marland Kitchen omega-3 acid ethyl esters (LOVAZA) 1 g capsule Take 1 g by mouth 2 (two) times daily.  . ondansetron (ZOFRAN ODT) 4 MG disintegrating tablet 4mg  ODT q6 hours prn nausea/vomit  . polyethylene glycol (MIRALAX / GLYCOLAX) packet Take 17 g by mouth daily.  . rizatriptan (MAXALT-MLT) 10 MG disintegrating tablet Take 10  mg by mouth as needed for migraine.   Marland Kitchen tetrahydrozoline 0.05 % ophthalmic solution Place 1 drop into both eyes as needed (for eye redness).  . vitamin E 400 UNIT capsule Take 400 Units by mouth daily.  . [DISCONTINUED] cefdinir (OMNICEF) 300 MG capsule Take 1 capsule (300 mg total) by mouth 2 (two) times daily. (Patient not taking: Reported on 02/15/2016)  . [DISCONTINUED] Dextrose-Fructose-Sod Citrate (NAUZENE) [phone] MG CHEW Chew 1 tablet by mouth as needed  (for nausea).   No facility-administered encounter medications on file as of 02/15/2016.     Allergies as of 02/15/2016 - Review Complete 02/15/2016  Allergen Reaction Noted  . Biaxin [clarithromycin] Anaphylaxis 12/17/2015  . Sulfonamide derivatives Itching and Rash   . Tequin [gatifloxacin] Anaphylaxis 12/17/2015  . Demerol Nausea And Vomiting 02/17/2011  . Lactose intolerance (gi) Diarrhea and Other (See Comments) 02/27/2014  . Nsaids Nausea Only and Other (See Comments) 02/17/2011  . Risedronate sodium Nausea And Vomiting and Other (See Comments)   . Rofecoxib Nausea And Vomiting and Other (See Comments)   . Alendronate sodium Nausea And Vomiting 02/27/2014  . Aspirin Other (See Comments) 10/24/2015  . Fish allergy Nausea And Vomiting 02/27/2014  . Benadryl [diphenhydramine hcl] Palpitations 02/27/2014    Past Medical History:  Diagnosis Date  . 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 Diverticulosis 2010  . Endometriosis   . MD (muscular dystrophy) ([place])   . Migraine   . [facility]    [facility]  . Peripheral neuropathy [facility])    Dr Erling Cruz    Past Surgical History:  Procedure Laterality Date  . ABDOMINAL HYSTERECTOMY     for Endometriosis  . APPENDECTOMY     high school; low grade appendiceal cancer  . COLONOSCOPY  01/2012   Tics; Dr Teena Irani  . ESI      X 3 in 2003 & 02/17/2011 for L4-S1 symptoms  . LUMBAR LAMINECTOMY  12/2012   [place] , [place]  . TONSILLECTOMY    . TUBAL LIGATION      Family History  Problem Relation Age of Onset  . Alcohol abuse Mother   . Hypertension Mother   . Diabetes Mother   . Stroke Mother 11  . Alzheimer's disease Father   . Cancer Maternal Aunt     pancreatic  . Heart disease Neg Hx   . Heart attack Neg Hx     Social History   Social History  . Marital status: Married    Spouse name: N/A  . Number of children: N/A  . Years of education: N/A   Occupational History  . Not on file.   Social History Main Topics  .  Smoking status: Never Smoker  . Smokeless tobacco: Never Used  . Alcohol use 1.2 oz/week    2 Glasses of wine per week     Comment: 2 glasses/ week  . Drug use: No  . Sexual activity: Not on file   Other Topics Concern  . Not on file   Social History Narrative  . No narrative on file   Review of systems: Review of Systems  Constitutional: Negative for fever and chills.  HENT: Negative.   Eyes: Negative for blurred vision.  Respiratory: as per HPI  Cardiovascular: Negative for chest pain and palpitations.  Gastrointestinal: Negative for vomiting, diarrhea, blood per rectum. Genitourinary: Negative for dysuria, urgency, frequency and hematuria.  Musculoskeletal: Negative for myalgias, back pain and joint pain.  Skin: Negative for itching  and rash.  Neurological: Negative for dizziness, tremors, focal weakness, seizures and loss of consciousness.  Endo/Heme/Allergies: Negative for environmental allergies.  Psychiatric/Behavioral: Negative for depression, suicidal ideas and hallucinations.  All other systems reviewed and are negative.  Physical Exam: Blood pressure 104/62, pulse 83, height 5' 4.25" (1.632 m), weight 107 lb (48.5 kg), SpO2 97 %. Gen:      No acute distress HEENT:  EOMI, sclera anicteric Neck:     No masses; no thyromegaly Lungs:    Clear to auscultation bilaterally; normal respiratory effort CV:         Regular rate and rhythm; no murmurs Abd:      + bowel sounds; soft, non-tender; no palpable masses, no distension Ext:    No edema; adequate peripheral perfusion Skin:      Warm and dry; no rash Neuro: alert and oriented x 3 Psych: normal mood and affect  Data Reviewed: PFTs 06/21/15 FVC 1.88 (58%] FEV1 1.84 (70%]  F/F 91 FEF 25-75 percent 101% NIP 30 Restrictive process suggested by reduction in FVC and FEV1.  08/03/15 FVC 2.08 (80%) FEV1 2.04 (81%] F/F 98 TLC 89% DLCO 68%.  ABG 08/03/15 7.42/34/104/98%  Imaging CXR 10/15/14 No acute  cardiopulmonary disease. Images reviewed.  Overnight oximetry 08/10/15 Mean 02 sat-91.5% O2 sat less than 89%-1hour 40 minutes O2 sat less than 88% 1 hour 17 minutes. Periodic desats noted with increase in heart rate  Assessment:  Limb-girdle muscular dystrophy Eval for restriction She was referred for evaluation of restriction on spirometry however a full set of PFTs shows normal lung volume and ABG is normal. We'll continue monitoring her as she has a progressive neuromuscular disease.  Her overnight oximetry shows periodic desaturation suggests sleep apnea. I recommended further evaluation with a sleep study. However she feels that the test was done which was in the middle of a viral infection. She wants to hold off on the sleep study and reevaluate at next visit.  Recurrent pneumonias  She has pneumonias on an annual basis due to inability to clear secretion. She will use the incentive spirometry and flutter valve to help with clearance of secretions. She will also start using a mask while quilting to limit exposure to cotton dust and fiber.  Plan/Recommendations: - Continue Flutter valve,incentive spirometer.  Marshell Garfinkel MD [facility] Pager [phone] 02/15/2016, 1:58 PM  CC: Carol Ada, MD

## 2016-04-19 DIAGNOSIS — R070 Pain in throat: Secondary | ICD-10-CM | POA: Diagnosis not present

## 2016-04-19 DIAGNOSIS — R490 Dysphonia: Secondary | ICD-10-CM | POA: Diagnosis not present

## 2016-04-19 DIAGNOSIS — J343 Hypertrophy of nasal turbinates: Secondary | ICD-10-CM | POA: Diagnosis not present

## 2016-04-25 DIAGNOSIS — G43009 Migraine without aura, not intractable, without status migrainosus: Secondary | ICD-10-CM | POA: Diagnosis not present

## 2016-04-25 DIAGNOSIS — G710 Muscular dystrophy: Secondary | ICD-10-CM | POA: Diagnosis not present

## 2016-05-04 DIAGNOSIS — H20012 Primary iridocyclitis, left eye: Secondary | ICD-10-CM | POA: Diagnosis not present

## 2016-05-25 DIAGNOSIS — H20012 Primary iridocyclitis, left eye: Secondary | ICD-10-CM | POA: Diagnosis not present

## 2016-05-30 DIAGNOSIS — Z85828 Personal history of other malignant neoplasm of skin: Secondary | ICD-10-CM | POA: Diagnosis not present

## 2016-05-30 DIAGNOSIS — M545 Low back pain: Secondary | ICD-10-CM | POA: Diagnosis not present

## 2016-05-30 DIAGNOSIS — M549 Dorsalgia, unspecified: Secondary | ICD-10-CM | POA: Diagnosis not present

## 2016-05-30 DIAGNOSIS — M542 Cervicalgia: Secondary | ICD-10-CM | POA: Diagnosis not present

## 2016-05-30 DIAGNOSIS — L111 Transient acantholytic dermatosis [Grover]: Secondary | ICD-10-CM | POA: Diagnosis not present

## 2016-05-30 DIAGNOSIS — L814 Other melanin hyperpigmentation: Secondary | ICD-10-CM | POA: Diagnosis not present

## 2016-05-30 DIAGNOSIS — M25511 Pain in right shoulder: Secondary | ICD-10-CM | POA: Diagnosis not present

## 2016-05-30 DIAGNOSIS — L570 Actinic keratosis: Secondary | ICD-10-CM | POA: Diagnosis not present

## 2016-05-30 DIAGNOSIS — L821 Other seborrheic keratosis: Secondary | ICD-10-CM | POA: Diagnosis not present

## 2016-06-08 DIAGNOSIS — M549 Dorsalgia, unspecified: Secondary | ICD-10-CM | POA: Diagnosis not present

## 2016-06-08 DIAGNOSIS — M25511 Pain in right shoulder: Secondary | ICD-10-CM | POA: Diagnosis not present

## 2016-06-08 DIAGNOSIS — M545 Low back pain: Secondary | ICD-10-CM | POA: Diagnosis not present

## 2016-06-08 DIAGNOSIS — M542 Cervicalgia: Secondary | ICD-10-CM | POA: Diagnosis not present

## 2016-06-13 DIAGNOSIS — M542 Cervicalgia: Secondary | ICD-10-CM | POA: Diagnosis not present

## 2016-06-13 DIAGNOSIS — M549 Dorsalgia, unspecified: Secondary | ICD-10-CM | POA: Diagnosis not present

## 2016-06-13 DIAGNOSIS — M25511 Pain in right shoulder: Secondary | ICD-10-CM | POA: Diagnosis not present

## 2016-06-13 DIAGNOSIS — M545 Low back pain: Secondary | ICD-10-CM | POA: Diagnosis not present

## 2016-06-22 DIAGNOSIS — R1013 Epigastric pain: Secondary | ICD-10-CM | POA: Diagnosis not present

## 2016-06-22 DIAGNOSIS — A0839 Other viral enteritis: Secondary | ICD-10-CM | POA: Diagnosis not present

## 2016-06-22 DIAGNOSIS — R197 Diarrhea, unspecified: Secondary | ICD-10-CM | POA: Diagnosis not present

## 2016-07-06 DIAGNOSIS — M25511 Pain in right shoulder: Secondary | ICD-10-CM | POA: Diagnosis not present

## 2016-07-06 DIAGNOSIS — M549 Dorsalgia, unspecified: Secondary | ICD-10-CM | POA: Diagnosis not present

## 2016-07-06 DIAGNOSIS — M542 Cervicalgia: Secondary | ICD-10-CM | POA: Diagnosis not present

## 2016-07-06 DIAGNOSIS — M545 Low back pain: Secondary | ICD-10-CM | POA: Diagnosis not present

## 2016-07-19 DIAGNOSIS — M25511 Pain in right shoulder: Secondary | ICD-10-CM | POA: Diagnosis not present

## 2016-07-19 DIAGNOSIS — M545 Low back pain: Secondary | ICD-10-CM | POA: Diagnosis not present

## 2016-07-19 DIAGNOSIS — M542 Cervicalgia: Secondary | ICD-10-CM | POA: Diagnosis not present

## 2016-07-19 DIAGNOSIS — M549 Dorsalgia, unspecified: Secondary | ICD-10-CM | POA: Diagnosis not present

## 2016-07-26 DIAGNOSIS — M545 Low back pain: Secondary | ICD-10-CM | POA: Diagnosis not present

## 2016-07-26 DIAGNOSIS — M542 Cervicalgia: Secondary | ICD-10-CM | POA: Diagnosis not present

## 2016-07-26 DIAGNOSIS — M25511 Pain in right shoulder: Secondary | ICD-10-CM | POA: Diagnosis not present

## 2016-07-26 DIAGNOSIS — M549 Dorsalgia, unspecified: Secondary | ICD-10-CM | POA: Diagnosis not present

## 2016-08-01 DIAGNOSIS — M545 Low back pain: Secondary | ICD-10-CM | POA: Diagnosis not present

## 2016-08-01 DIAGNOSIS — M549 Dorsalgia, unspecified: Secondary | ICD-10-CM | POA: Diagnosis not present

## 2016-08-01 DIAGNOSIS — M542 Cervicalgia: Secondary | ICD-10-CM | POA: Diagnosis not present

## 2016-08-01 DIAGNOSIS — M25511 Pain in right shoulder: Secondary | ICD-10-CM | POA: Diagnosis not present

## 2016-08-03 DIAGNOSIS — M545 Low back pain: Secondary | ICD-10-CM | POA: Diagnosis not present

## 2016-08-03 DIAGNOSIS — M542 Cervicalgia: Secondary | ICD-10-CM | POA: Diagnosis not present

## 2016-08-03 DIAGNOSIS — M549 Dorsalgia, unspecified: Secondary | ICD-10-CM | POA: Diagnosis not present

## 2016-08-03 DIAGNOSIS — M25511 Pain in right shoulder: Secondary | ICD-10-CM | POA: Diagnosis not present

## 2016-08-07 DIAGNOSIS — M542 Cervicalgia: Secondary | ICD-10-CM | POA: Diagnosis not present

## 2016-08-07 DIAGNOSIS — M545 Low back pain: Secondary | ICD-10-CM | POA: Diagnosis not present

## 2016-08-07 DIAGNOSIS — M549 Dorsalgia, unspecified: Secondary | ICD-10-CM | POA: Diagnosis not present

## 2016-08-07 DIAGNOSIS — M25511 Pain in right shoulder: Secondary | ICD-10-CM | POA: Diagnosis not present

## 2016-08-22 DIAGNOSIS — M549 Dorsalgia, unspecified: Secondary | ICD-10-CM | POA: Diagnosis not present

## 2016-08-22 DIAGNOSIS — M545 Low back pain: Secondary | ICD-10-CM | POA: Diagnosis not present

## 2016-08-22 DIAGNOSIS — M25511 Pain in right shoulder: Secondary | ICD-10-CM | POA: Diagnosis not present

## 2016-08-22 DIAGNOSIS — M542 Cervicalgia: Secondary | ICD-10-CM | POA: Diagnosis not present

## 2016-08-30 DIAGNOSIS — M542 Cervicalgia: Secondary | ICD-10-CM | POA: Diagnosis not present

## 2016-08-30 DIAGNOSIS — M545 Low back pain: Secondary | ICD-10-CM | POA: Diagnosis not present

## 2016-08-30 DIAGNOSIS — M25511 Pain in right shoulder: Secondary | ICD-10-CM | POA: Diagnosis not present

## 2016-08-30 DIAGNOSIS — M549 Dorsalgia, unspecified: Secondary | ICD-10-CM | POA: Diagnosis not present

## 2016-09-11 DIAGNOSIS — H903 Sensorineural hearing loss, bilateral: Secondary | ICD-10-CM | POA: Diagnosis not present

## 2016-09-11 DIAGNOSIS — H8103 Meniere's disease, bilateral: Secondary | ICD-10-CM | POA: Diagnosis not present

## 2016-09-12 DIAGNOSIS — M542 Cervicalgia: Secondary | ICD-10-CM | POA: Diagnosis not present

## 2016-09-12 DIAGNOSIS — M545 Low back pain: Secondary | ICD-10-CM | POA: Diagnosis not present

## 2016-09-12 DIAGNOSIS — M25511 Pain in right shoulder: Secondary | ICD-10-CM | POA: Diagnosis not present

## 2016-09-12 DIAGNOSIS — M549 Dorsalgia, unspecified: Secondary | ICD-10-CM | POA: Diagnosis not present

## 2016-10-04 DIAGNOSIS — R110 Nausea: Secondary | ICD-10-CM | POA: Diagnosis not present

## 2016-10-04 DIAGNOSIS — R1013 Epigastric pain: Secondary | ICD-10-CM | POA: Diagnosis not present

## 2016-10-18 DIAGNOSIS — R110 Nausea: Secondary | ICD-10-CM | POA: Diagnosis not present

## 2016-10-31 DIAGNOSIS — G43009 Migraine without aura, not intractable, without status migrainosus: Secondary | ICD-10-CM | POA: Diagnosis not present

## 2016-10-31 DIAGNOSIS — G710 Muscular dystrophy: Secondary | ICD-10-CM | POA: Diagnosis not present

## 2016-11-02 DIAGNOSIS — Z0000 Encounter for general adult medical examination without abnormal findings: Secondary | ICD-10-CM | POA: Diagnosis not present

## 2016-11-02 DIAGNOSIS — Z8639 Personal history of other endocrine, nutritional and metabolic disease: Secondary | ICD-10-CM | POA: Diagnosis not present

## 2016-11-02 DIAGNOSIS — Z1389 Encounter for screening for other disorder: Secondary | ICD-10-CM | POA: Diagnosis not present

## 2016-11-02 DIAGNOSIS — Z13220 Encounter for screening for lipoid disorders: Secondary | ICD-10-CM | POA: Diagnosis not present

## 2016-11-02 DIAGNOSIS — M810 Age-related osteoporosis without current pathological fracture: Secondary | ICD-10-CM | POA: Diagnosis not present

## 2016-11-02 DIAGNOSIS — Z681 Body mass index (BMI) 19 or less, adult: Secondary | ICD-10-CM | POA: Diagnosis not present

## 2016-11-02 DIAGNOSIS — Z136 Encounter for screening for cardiovascular disorders: Secondary | ICD-10-CM | POA: Diagnosis not present

## 2016-11-03 DIAGNOSIS — E559 Vitamin D deficiency, unspecified: Secondary | ICD-10-CM | POA: Diagnosis not present

## 2016-11-03 DIAGNOSIS — Z131 Encounter for screening for diabetes mellitus: Secondary | ICD-10-CM | POA: Diagnosis not present

## 2016-11-03 DIAGNOSIS — Z136 Encounter for screening for cardiovascular disorders: Secondary | ICD-10-CM | POA: Diagnosis not present

## 2016-11-03 DIAGNOSIS — Z13220 Encounter for screening for lipoid disorders: Secondary | ICD-10-CM | POA: Diagnosis not present

## 2016-11-03 DIAGNOSIS — Z0000 Encounter for general adult medical examination without abnormal findings: Secondary | ICD-10-CM | POA: Diagnosis not present

## 2016-11-09 DIAGNOSIS — Z681 Body mass index (BMI) 19 or less, adult: Secondary | ICD-10-CM | POA: Diagnosis not present

## 2016-11-09 DIAGNOSIS — N951 Menopausal and female climacteric states: Secondary | ICD-10-CM | POA: Diagnosis not present

## 2016-11-09 DIAGNOSIS — Z01419 Encounter for gynecological examination (general) (routine) without abnormal findings: Secondary | ICD-10-CM | POA: Diagnosis not present

## 2016-11-13 DIAGNOSIS — R634 Abnormal weight loss: Secondary | ICD-10-CM | POA: Diagnosis not present

## 2016-11-13 DIAGNOSIS — R110 Nausea: Secondary | ICD-10-CM | POA: Diagnosis not present

## 2016-12-15 DIAGNOSIS — W19XXXA Unspecified fall, initial encounter: Secondary | ICD-10-CM | POA: Diagnosis not present

## 2016-12-15 DIAGNOSIS — S300XXA Contusion of lower back and pelvis, initial encounter: Secondary | ICD-10-CM | POA: Diagnosis not present

## 2016-12-27 ENCOUNTER — Other Ambulatory Visit: Payer: Self-pay | Admitting: *Deleted

## 2016-12-27 DIAGNOSIS — I6529 Occlusion and stenosis of unspecified carotid artery: Secondary | ICD-10-CM

## 2017-01-04 DIAGNOSIS — M545 Low back pain: Secondary | ICD-10-CM | POA: Diagnosis not present

## 2017-01-04 DIAGNOSIS — M25511 Pain in right shoulder: Secondary | ICD-10-CM | POA: Diagnosis not present

## 2017-01-04 DIAGNOSIS — M542 Cervicalgia: Secondary | ICD-10-CM | POA: Diagnosis not present

## 2017-01-04 DIAGNOSIS — M549 Dorsalgia, unspecified: Secondary | ICD-10-CM | POA: Diagnosis not present

## 2017-01-11 DIAGNOSIS — M549 Dorsalgia, unspecified: Secondary | ICD-10-CM | POA: Diagnosis not present

## 2017-01-11 DIAGNOSIS — M545 Low back pain: Secondary | ICD-10-CM | POA: Diagnosis not present

## 2017-01-11 DIAGNOSIS — M25511 Pain in right shoulder: Secondary | ICD-10-CM | POA: Diagnosis not present

## 2017-01-11 DIAGNOSIS — M542 Cervicalgia: Secondary | ICD-10-CM | POA: Diagnosis not present

## 2017-01-17 DIAGNOSIS — M25511 Pain in right shoulder: Secondary | ICD-10-CM | POA: Diagnosis not present

## 2017-01-17 DIAGNOSIS — M549 Dorsalgia, unspecified: Secondary | ICD-10-CM | POA: Diagnosis not present

## 2017-01-17 DIAGNOSIS — M545 Low back pain: Secondary | ICD-10-CM | POA: Diagnosis not present

## 2017-01-17 DIAGNOSIS — M542 Cervicalgia: Secondary | ICD-10-CM | POA: Diagnosis not present

## 2017-01-22 DIAGNOSIS — M545 Low back pain: Secondary | ICD-10-CM | POA: Diagnosis not present

## 2017-01-22 DIAGNOSIS — M25511 Pain in right shoulder: Secondary | ICD-10-CM | POA: Diagnosis not present

## 2017-01-22 DIAGNOSIS — M542 Cervicalgia: Secondary | ICD-10-CM | POA: Diagnosis not present

## 2017-01-22 DIAGNOSIS — M549 Dorsalgia, unspecified: Secondary | ICD-10-CM | POA: Diagnosis not present

## 2017-01-30 DIAGNOSIS — H5203 Hypermetropia, bilateral: Secondary | ICD-10-CM | POA: Diagnosis not present

## 2017-01-30 DIAGNOSIS — H179 Unspecified corneal scar and opacity: Secondary | ICD-10-CM | POA: Diagnosis not present

## 2017-01-30 DIAGNOSIS — H3530 Unspecified macular degeneration: Secondary | ICD-10-CM | POA: Diagnosis not present

## 2017-01-30 DIAGNOSIS — H52223 Regular astigmatism, bilateral: Secondary | ICD-10-CM | POA: Diagnosis not present

## 2017-01-30 DIAGNOSIS — H353131 Nonexudative age-related macular degeneration, bilateral, early dry stage: Secondary | ICD-10-CM | POA: Diagnosis not present

## 2017-02-05 DIAGNOSIS — M8589 Other specified disorders of bone density and structure, multiple sites: Secondary | ICD-10-CM | POA: Diagnosis not present

## 2017-02-06 DIAGNOSIS — M545 Low back pain: Secondary | ICD-10-CM | POA: Diagnosis not present

## 2017-02-06 DIAGNOSIS — M25511 Pain in right shoulder: Secondary | ICD-10-CM | POA: Diagnosis not present

## 2017-02-06 DIAGNOSIS — M549 Dorsalgia, unspecified: Secondary | ICD-10-CM | POA: Diagnosis not present

## 2017-02-06 DIAGNOSIS — M542 Cervicalgia: Secondary | ICD-10-CM | POA: Diagnosis not present

## 2017-02-15 DIAGNOSIS — M549 Dorsalgia, unspecified: Secondary | ICD-10-CM | POA: Diagnosis not present

## 2017-02-15 DIAGNOSIS — M542 Cervicalgia: Secondary | ICD-10-CM | POA: Diagnosis not present

## 2017-02-15 DIAGNOSIS — M25511 Pain in right shoulder: Secondary | ICD-10-CM | POA: Diagnosis not present

## 2017-02-15 DIAGNOSIS — M545 Low back pain: Secondary | ICD-10-CM | POA: Diagnosis not present

## 2017-02-19 DIAGNOSIS — M545 Low back pain: Secondary | ICD-10-CM | POA: Diagnosis not present

## 2017-02-19 DIAGNOSIS — M542 Cervicalgia: Secondary | ICD-10-CM | POA: Diagnosis not present

## 2017-02-19 DIAGNOSIS — M549 Dorsalgia, unspecified: Secondary | ICD-10-CM | POA: Diagnosis not present

## 2017-02-19 DIAGNOSIS — M25511 Pain in right shoulder: Secondary | ICD-10-CM | POA: Diagnosis not present

## 2017-03-06 DIAGNOSIS — R42 Dizziness and giddiness: Secondary | ICD-10-CM

## 2017-03-06 HISTORY — DX: Dizziness and giddiness: R42

## 2017-03-07 ENCOUNTER — Telehealth: Payer: Self-pay | Admitting: Pulmonary Disease

## 2017-03-07 DIAGNOSIS — R05 Cough: Secondary | ICD-10-CM | POA: Diagnosis not present

## 2017-03-07 DIAGNOSIS — J180 Bronchopneumonia, unspecified organism: Secondary | ICD-10-CM | POA: Diagnosis not present

## 2017-03-07 NOTE — Telephone Encounter (Signed)
Spoke with pt's husband. States that pt is not feeling well. Reports increased coughing with green mucus production for 1 week. Denies chest tightness, wheezing, SOB or fever. Advised pt's husband that we do not have any available appointments today. He understood and states that he will take her to a local urgent care. Nothing further was needed at this time.

## 2017-04-19 DIAGNOSIS — G7100 Muscular dystrophy, unspecified: Secondary | ICD-10-CM | POA: Diagnosis not present

## 2017-04-19 DIAGNOSIS — G35 Multiple sclerosis: Secondary | ICD-10-CM | POA: Diagnosis not present

## 2017-04-19 DIAGNOSIS — G43009 Migraine without aura, not intractable, without status migrainosus: Secondary | ICD-10-CM | POA: Diagnosis not present

## 2017-05-02 DIAGNOSIS — M25511 Pain in right shoulder: Secondary | ICD-10-CM | POA: Diagnosis not present

## 2017-05-02 DIAGNOSIS — M549 Dorsalgia, unspecified: Secondary | ICD-10-CM | POA: Diagnosis not present

## 2017-05-02 DIAGNOSIS — M545 Low back pain: Secondary | ICD-10-CM | POA: Diagnosis not present

## 2017-05-02 DIAGNOSIS — M542 Cervicalgia: Secondary | ICD-10-CM | POA: Diagnosis not present

## 2017-05-08 DIAGNOSIS — M545 Low back pain: Secondary | ICD-10-CM | POA: Diagnosis not present

## 2017-05-08 DIAGNOSIS — M542 Cervicalgia: Secondary | ICD-10-CM | POA: Diagnosis not present

## 2017-05-08 DIAGNOSIS — M25511 Pain in right shoulder: Secondary | ICD-10-CM | POA: Diagnosis not present

## 2017-05-08 DIAGNOSIS — M549 Dorsalgia, unspecified: Secondary | ICD-10-CM | POA: Diagnosis not present

## 2017-05-17 DIAGNOSIS — M549 Dorsalgia, unspecified: Secondary | ICD-10-CM | POA: Diagnosis not present

## 2017-05-17 DIAGNOSIS — M545 Low back pain: Secondary | ICD-10-CM | POA: Diagnosis not present

## 2017-05-17 DIAGNOSIS — M25511 Pain in right shoulder: Secondary | ICD-10-CM | POA: Diagnosis not present

## 2017-05-17 DIAGNOSIS — M542 Cervicalgia: Secondary | ICD-10-CM | POA: Diagnosis not present

## 2017-05-21 DIAGNOSIS — Z1211 Encounter for screening for malignant neoplasm of colon: Secondary | ICD-10-CM | POA: Diagnosis not present

## 2017-05-21 DIAGNOSIS — R634 Abnormal weight loss: Secondary | ICD-10-CM | POA: Diagnosis not present

## 2017-05-21 DIAGNOSIS — R110 Nausea: Secondary | ICD-10-CM | POA: Diagnosis not present

## 2017-05-28 DIAGNOSIS — M542 Cervicalgia: Secondary | ICD-10-CM | POA: Diagnosis not present

## 2017-05-28 DIAGNOSIS — M545 Low back pain: Secondary | ICD-10-CM | POA: Diagnosis not present

## 2017-05-28 DIAGNOSIS — M25511 Pain in right shoulder: Secondary | ICD-10-CM | POA: Diagnosis not present

## 2017-05-28 DIAGNOSIS — M549 Dorsalgia, unspecified: Secondary | ICD-10-CM | POA: Diagnosis not present

## 2017-06-11 DIAGNOSIS — M545 Low back pain: Secondary | ICD-10-CM | POA: Diagnosis not present

## 2017-06-11 DIAGNOSIS — M542 Cervicalgia: Secondary | ICD-10-CM | POA: Diagnosis not present

## 2017-06-11 DIAGNOSIS — M549 Dorsalgia, unspecified: Secondary | ICD-10-CM | POA: Diagnosis not present

## 2017-06-11 DIAGNOSIS — M25511 Pain in right shoulder: Secondary | ICD-10-CM | POA: Diagnosis not present

## 2017-06-18 DIAGNOSIS — Z85828 Personal history of other malignant neoplasm of skin: Secondary | ICD-10-CM | POA: Diagnosis not present

## 2017-06-18 DIAGNOSIS — L814 Other melanin hyperpigmentation: Secondary | ICD-10-CM | POA: Diagnosis not present

## 2017-06-18 DIAGNOSIS — L718 Other rosacea: Secondary | ICD-10-CM | POA: Diagnosis not present

## 2017-06-18 DIAGNOSIS — L570 Actinic keratosis: Secondary | ICD-10-CM | POA: Diagnosis not present

## 2017-06-18 DIAGNOSIS — D224 Melanocytic nevi of scalp and neck: Secondary | ICD-10-CM | POA: Diagnosis not present

## 2017-06-19 DIAGNOSIS — M542 Cervicalgia: Secondary | ICD-10-CM | POA: Diagnosis not present

## 2017-06-19 DIAGNOSIS — M549 Dorsalgia, unspecified: Secondary | ICD-10-CM | POA: Diagnosis not present

## 2017-06-19 DIAGNOSIS — M25511 Pain in right shoulder: Secondary | ICD-10-CM | POA: Diagnosis not present

## 2017-06-19 DIAGNOSIS — M545 Low back pain: Secondary | ICD-10-CM | POA: Diagnosis not present

## 2017-07-12 DIAGNOSIS — M542 Cervicalgia: Secondary | ICD-10-CM | POA: Diagnosis not present

## 2017-07-12 DIAGNOSIS — M25511 Pain in right shoulder: Secondary | ICD-10-CM | POA: Diagnosis not present

## 2017-07-12 DIAGNOSIS — M549 Dorsalgia, unspecified: Secondary | ICD-10-CM | POA: Diagnosis not present

## 2017-07-12 DIAGNOSIS — M545 Low back pain: Secondary | ICD-10-CM | POA: Diagnosis not present

## 2017-07-18 DIAGNOSIS — J018 Other acute sinusitis: Secondary | ICD-10-CM | POA: Diagnosis not present

## 2017-07-18 DIAGNOSIS — R002 Palpitations: Secondary | ICD-10-CM | POA: Diagnosis not present

## 2017-08-15 DIAGNOSIS — R110 Nausea: Secondary | ICD-10-CM | POA: Diagnosis not present

## 2017-08-15 DIAGNOSIS — R5383 Other fatigue: Secondary | ICD-10-CM | POA: Diagnosis not present

## 2017-08-15 DIAGNOSIS — R6889 Other general symptoms and signs: Secondary | ICD-10-CM | POA: Diagnosis not present

## 2017-08-29 DIAGNOSIS — H8103 Meniere's disease, bilateral: Secondary | ICD-10-CM | POA: Diagnosis not present

## 2017-08-29 DIAGNOSIS — H903 Sensorineural hearing loss, bilateral: Secondary | ICD-10-CM | POA: Diagnosis not present

## 2017-08-29 DIAGNOSIS — G43719 Chronic migraine without aura, intractable, without status migrainosus: Secondary | ICD-10-CM | POA: Diagnosis not present

## 2017-09-01 ENCOUNTER — Other Ambulatory Visit: Payer: Self-pay | Admitting: Otolaryngology

## 2017-09-01 DIAGNOSIS — H8103 Meniere's disease, bilateral: Secondary | ICD-10-CM

## 2017-09-01 DIAGNOSIS — H9041 Sensorineural hearing loss, unilateral, right ear, with unrestricted hearing on the contralateral side: Secondary | ICD-10-CM

## 2017-09-01 DIAGNOSIS — G43711 Chronic migraine without aura, intractable, with status migrainosus: Secondary | ICD-10-CM

## 2017-09-01 DIAGNOSIS — H8101 Meniere's disease, right ear: Secondary | ICD-10-CM

## 2017-09-01 DIAGNOSIS — H903 Sensorineural hearing loss, bilateral: Secondary | ICD-10-CM

## 2017-09-08 ENCOUNTER — Ambulatory Visit
Admission: RE | Admit: 2017-09-08 | Discharge: 2017-09-08 | Disposition: A | Discharge status: Home / Self Care | Payer: Medicare Other | Source: Ambulatory Visit | Attending: Otolaryngology | Admitting: Otolaryngology

## 2017-09-08 DIAGNOSIS — H8101 Meniere's disease, right ear: Secondary | ICD-10-CM

## 2017-09-08 DIAGNOSIS — H9041 Sensorineural hearing loss, unilateral, right ear, with unrestricted hearing on the contralateral side: Secondary | ICD-10-CM

## 2017-09-08 DIAGNOSIS — H903 Sensorineural hearing loss, bilateral: Secondary | ICD-10-CM

## 2017-09-08 DIAGNOSIS — G43711 Chronic migraine without aura, intractable, with status migrainosus: Secondary | ICD-10-CM

## 2017-09-08 DIAGNOSIS — H8103 Meniere's disease, bilateral: Secondary | ICD-10-CM | POA: Diagnosis not present

## 2017-09-08 MED ORDER — GADOBENATE DIMEGLUMINE 529 MG/ML IV SOLN
10.0000 mL | Freq: Once | INTRAVENOUS | Status: AC | PRN
  Administered 2017-09-08: 10 mL via INTRAVENOUS

## 2017-09-14 DIAGNOSIS — G43009 Migraine without aura, not intractable, without status migrainosus: Secondary | ICD-10-CM | POA: Diagnosis not present

## 2017-09-14 DIAGNOSIS — G43111 Migraine with aura, intractable, with status migrainosus: Secondary | ICD-10-CM | POA: Diagnosis not present

## 2017-10-19 DIAGNOSIS — G43009 Migraine without aura, not intractable, without status migrainosus: Secondary | ICD-10-CM | POA: Diagnosis not present

## 2017-10-19 DIAGNOSIS — G7100 Muscular dystrophy, unspecified: Secondary | ICD-10-CM | POA: Diagnosis not present

## 2017-12-26 ENCOUNTER — Telehealth: Payer: Self-pay | Admitting: Primary Care

## 2017-12-26 ENCOUNTER — Other Ambulatory Visit: Payer: Self-pay | Admitting: Primary Care

## 2017-12-26 ENCOUNTER — Encounter: Payer: Self-pay | Admitting: Primary Care

## 2017-12-26 ENCOUNTER — Ambulatory Visit (INDEPENDENT_AMBULATORY_CARE_PROVIDER_SITE_OTHER): Payer: Medicare Other | Admitting: Primary Care

## 2017-12-26 ENCOUNTER — Ambulatory Visit (INDEPENDENT_AMBULATORY_CARE_PROVIDER_SITE_OTHER)
Admission: RE | Admit: 2017-12-26 | Discharge: 2017-12-26 | Disposition: A | Discharge status: Home / Self Care | Payer: Medicare Other | Source: Ambulatory Visit | Attending: Primary Care | Admitting: Primary Care

## 2017-12-26 VITALS — BP 127/78 | HR 96 | Temp 97.7°F | Wt 106.8 lb

## 2017-12-26 DIAGNOSIS — J209 Acute bronchitis, unspecified: Secondary | ICD-10-CM

## 2017-12-26 DIAGNOSIS — R05 Cough: Secondary | ICD-10-CM | POA: Diagnosis not present

## 2017-12-26 DIAGNOSIS — R0602 Shortness of breath: Secondary | ICD-10-CM | POA: Diagnosis not present

## 2017-12-26 MED ORDER — LEVALBUTEROL HCL 0.63 MG/3ML IN NEBU
0.6300 mg | INHALATION_SOLUTION | RESPIRATORY_TRACT | Status: AC
  Administered 2017-12-26: 0.63 mg via RESPIRATORY_TRACT

## 2017-12-26 MED ORDER — ALBUTEROL SULFATE HFA 108 (90 BASE) MCG/ACT IN AERS: 2.0000 | INHALATION_SPRAY | RESPIRATORY_TRACT | 2 refills | Status: DC | PRN

## 2017-12-26 MED ORDER — BENZONATATE 100 MG PO CAPS: 200.0000 mg | ORAL_CAPSULE | Freq: Three times a day (TID) | ORAL | 1 refills | Status: DC

## 2017-12-26 MED ORDER — LEVALBUTEROL TARTRATE 45 MCG/ACT IN AERO: 1.0000 | INHALATION_SPRAY | RESPIRATORY_TRACT | 3 refills | Status: DC | PRN

## 2017-12-26 MED ORDER — ONDANSETRON 4 MG PO TBDP: ORAL_TABLET | ORAL | 0 refills | Status: DC

## 2017-12-26 NOTE — Telephone Encounter (Signed)
Yes, Xopenex hfa is fine

## 2017-12-26 NOTE — Assessment & Plan Note (Signed)
Symptoms consistent with viral URI/bronchitis Office treatment Given Xopenex neb in office for chest tightness with improvement Plan - Sending in [employer] 3 times daily as needed cough - Albuterol rescue inhaler 2 puffs every 4-6 hours as needed for shortness of breath or wheezing - We will check a chest x-ray  - Return if symptoms do not improve or worsen in any way

## 2017-12-26 NOTE — Telephone Encounter (Signed)
Spoke with pt, advised her that I sent in the Xopenex inhaler to [facility] on [address]. Pt understood and nothing further is needed.

## 2017-12-26 NOTE — Patient Instructions (Addendum)
Seen today for viral URI symptoms and bronchitis  Please monitor your temperature at home, take Tylenol 650 mg as needed for fever or chills Stay well-hydrated Eat bland foods (bananas, rice, applesauce, toast)  Recommendation: - Delsym cough syrup twice daily - Tessalon Perle 3 times daily as needed for cough (RX sent to pharmacy) - Continues flonase nasal spray - Albuterol rescue inhaler 2 puffs every 4-6 hours as needed for shortness of breath or wheezing (RX sent to pharmacy)  Office treatment: -Xopenex nebulizer treatment  Order: - Chest x-ray today (We will call with results)  Follow-up: - Return if symptoms do not improve or worsen

## 2017-12-26 NOTE — Telephone Encounter (Signed)
Called patient unable to reach left message to give us a call back.

## 2017-12-26 NOTE — Telephone Encounter (Signed)
Patient returning Heather's phone call.  Patient states has prescription for Zofran.

## 2017-12-26 NOTE — Telephone Encounter (Addendum)
Ok called pt to confirm and she states she did receive the zofran. She also states she is unable to take albuterol and thought a Xopenex inhaler would be called in. Albuterol makes her have terrible tachycardia and she is afraid to use it. She thought that she received was the inhaler but when she got it she realized it was albuterol. Can we send in a RX for xopenex inhaler? Beth please advise.

## 2017-12-26 NOTE — Progress Notes (Signed)
@Patient  ID: Alicia [name], female    DOB: 01/07/1946, 72 y.o.   MRN: [id number]  Chief Complaint  Patient presents with  . Acute Visit    cough with white and foamy, head drainage, headache, nausea, SOB with exertion x3 days.      Referring provider: Carol Ada, MD  HPI: 72 year old female, never smoked. PMH bronchitis and recurrent PNA. Patient of Dr. Vaughan Browner, last seen in 2017.  12/26/2017 Patient presents today for acute visit with complains of cough. Associated sore throat, chest tightness, nausea and headache. Symptoms for the last 4 days.  Cough is currently dry, previously described as thick white opaque sputum.  Recent sick contact 1 week ago. She has been using Flonase at bedtime and started Mucinex yesterday.  Taking Zofran as needed for nausea.  PCP recently moved to [place] and needs to find a new provider. Denies sob, wheezing, diarrhea. Afebrile.     Allergies  Allergen Reactions  . Biaxin [Clarithromycin] Anaphylaxis  . Sulfonamide Derivatives Itching and Rash  . Tequin [Gatifloxacin] Anaphylaxis  . Demerol Nausea And Vomiting  . Lactose Intolerance (Gi) Diarrhea and Other (See Comments)    Reaction:  Abdominal pain   . Nsaids Nausea Only and Other (See Comments)    Reaction:  GI upset/heartburn   . Risedronate Sodium Nausea And Vomiting and Other (See Comments)    Reaction:  GI upset/heartburn   . Rofecoxib Nausea And Vomiting and Other (See Comments)    Reaction:  GI upset/heartburn   . Alendronate Sodium Nausea And Vomiting  . Aspirin Other (See Comments)    Reaction:  Abdominal pain   . Fish Allergy Nausea And Vomiting  . Benadryl [Diphenhydramine Hcl] Palpitations    Immunization History  Administered Date(s) Administered  . Influenza Whole 01/27/2008  . Influenza, High Dose Seasonal PF 12/05/2013  . Zoster 02/23/2009    Past Medical History:  Diagnosis Date  . 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 Diverticulosis 2010  . Endometriosis   .  MD (muscular dystrophy) ([place])   . Migraine   . [facility]openia    [facility]  . Peripheral neuropathy    Dr Erling Cruz    Tobacco History: Social History   Tobacco Use  Smoking Status Never Smoker  Smokeless Tobacco Never Used   Counseling given: Not Answered   Outpatient Medications Prior to Visit  Medication Sig Dispense Refill  . acetaminophen (TYLENOL) 500 MG tablet Take 1,000 mg by mouth at bedtime.     . baclofen (LIORESAL) 10 MG tablet Take 10 mg by mouth at bedtime.     . Biotin 5000 MCG TABS Take 5,000 mcg by mouth daily.     . butalbital-acetaminophen-caffeine (FIORICET, ESGIC) 50-325-40 MG tablet Take 1 tablet by mouth every 4 (four) hours as needed (for severe pain).    . Carboxymethylcellulose Sodium (THERATEARS) 0.25 % SOLN Apply 1 drop to eye 3 (three) times daily.    . cholecalciferol (D-VI-SOL) 400 UNIT/ML LIQD Take 800 Units by mouth daily.    . divalproex (DEPAKOTE ER) 250 MG 24 hr tablet   3  . Estradiol Acetate (FEMRING) 0.05 MG/24HR RING Place 1 each vaginally every 3 (three) months.    . Evening Primrose Oil 1000 MG CAPS Take 1,000 mg by mouth daily.    Marland Kitchen gabapentin (NEURONTIN) 100 MG capsule Take 200 mg by mouth at bedtime.     Marland Kitchen guaifenesin (HUMIBID E) 400 MG TABS tablet Take 400 mg  by mouth every 4 (four) hours as needed (for chest congestion).    Marland Kitchen lactase (LACTAID) 3000 units tablet Take 3,000 Units by mouth 3 (three) times daily as needed (when eating dairy products).    . Lutein 20 MG TABS Take 20 mg by mouth daily.     . methocarbamol (ROBAXIN) 750 MG tablet Take 750 mg by mouth every 6 (six) hours as needed for muscle spasms.     . Multiple Vitamin (MULTIVITAMIN WITH MINERALS) TABS tablet Take 1 tablet by mouth daily.    . Multiple Vitamins-Minerals (PRESERVISION AREDS 2) CAPS Take 1 capsule by mouth 2 (two) times daily.    Marland Kitchen omega-3 acid ethyl esters (LOVAZA) 1 g capsule Take 1 g by mouth 2 (two) times daily.    . polyethylene glycol (MIRALAX /  GLYCOLAX) packet Take 17 g by mouth daily.    . rizatriptan (MAXALT-MLT) 10 MG disintegrating tablet Take 10 mg by mouth as needed for migraine.     Marland Kitchen tetrahydrozoline 0.05 % ophthalmic solution Place 1 drop into both eyes as needed (for eye redness).    . vitamin E 400 UNIT capsule Take 400 Units by mouth daily.    Marland Kitchen loratadine (CLARITIN) 10 MG tablet Take 10 mg by mouth daily as needed for allergies.     . carboxymethylcellulose (REFRESH PLUS) 0.5 % SOLN Place 1 drop into both eyes as needed (for dry eyes).    . Fenoprofen Calcium (NALFON) 200 MG CAPS capsule Take 200 mg by mouth 3 (three) times daily.    . magnesium gluconate (MAGONATE) 500 MG tablet Take 500 mg by mouth at bedtime.     . ondansetron (ZOFRAN ODT) 4 MG disintegrating tablet 4mg  ODT q6 hours prn nausea/vomit (Patient not taking: Reported on 12/26/2017) 10 tablet 0   No facility-administered medications prior to visit.     Review of Systems  Review of Systems  Constitutional: Negative.   HENT: Negative.  Negative for congestion, sinus pressure and sinus pain.   Respiratory: Positive for cough and chest tightness. Negative for shortness of breath.   Cardiovascular: Negative.   Gastrointestinal: Positive for nausea. Negative for diarrhea and vomiting.  Neurological: Positive for headaches.    Physical Exam  BP 127/78   Pulse 96   Temp 97.7 F (36.5 C)   Wt 106 lb 12.8 oz (48.4 kg)   SpO2 100%   BMI 18.19 kg/m  Physical Exam  Constitutional: She is oriented to person, place, and time. She appears well-developed and well-nourished. No distress.  Thin female  HENT:  Head: Normocephalic and atraumatic.  Right Ear: Hearing, tympanic membrane and ear canal normal.  Left Ear: Hearing, tympanic membrane and ear canal normal.  Nose: Nose normal. No mucosal edema. Right sinus exhibits no maxillary sinus tenderness and no frontal sinus tenderness. Left sinus exhibits no maxillary sinus tenderness and no frontal sinus  tenderness.  Mouth/Throat: Uvula is midline, oropharynx is clear and moist and mucous membranes are normal.  Eyes: Pupils are equal, round, and reactive to light. EOM are normal.  Neck: Normal range of motion. Neck supple.  Cardiovascular: Normal rate and regular rhythm.  Pulmonary/Chest: Effort normal and breath sounds normal.  CTA, dry/tight cough No resp distress   Musculoskeletal: Normal range of motion.  Neurological: She is alert and oriented to person, place, and time.  Skin: Skin is warm and dry.  Psychiatric: She has a normal mood and affect. Her behavior is normal. Judgment and thought content normal.  Lab Results:  CBC    Component Value Date/Time   WBC 5.2 10/24/2015 1000   RBC 4.53 10/24/2015 1000   HGB 13.7 10/24/2015 1000   HCT 41.0 10/24/2015 1000   PLT 293 10/24/2015 1000   MCV 90.5 10/24/2015 1000   MCH 30.2 10/24/2015 1000   MCHC 33.4 10/24/2015 1000   RDW 14.3 10/24/2015 1000   LYMPHSABS 0.9 10/24/2015 1000   MONOABS 0.3 10/24/2015 1000   EOSABS 0.1 10/24/2015 1000   BASOSABS 0.0 10/24/2015 1000    BMET    Component Value Date/Time   NA 132 (L) 10/24/2015 1000   K 3.7 10/24/2015 1000   CL 97 (L) 10/24/2015 1000   CO2 27 10/24/2015 1000   GLUCOSE 127 (H) 10/24/2015 1000   BUN 13 10/24/2015 1000   CREATININE 0.54 10/24/2015 1000   CALCIUM 9.6 10/24/2015 1000   GFRNONAA >60 10/24/2015 1000   GFRAA >60 10/24/2015 1000    BNP    Component Value Date/Time   BNP 159.0 (H) 10/15/2014 1748    ProBNP No results found for: PROBNP  Imaging: No results found.   Assessment & Plan:   Acute bronchitis Symptoms consistent with viral URI/bronchitis Office treatment Given Xopenex neb in office for chest tightness with improvement Plan - Sending in [employer] 3 times daily as needed cough - Albuterol rescue inhaler 2 puffs every 4-6 hours as needed for shortness of breath or wheezing - We will check a chest x-ray  - Return if symptoms do  not improve or worsen in any way   Martyn Ehrich, NP 12/26/2017

## 2018-01-21 ENCOUNTER — Telehealth: Payer: Self-pay | Admitting: Pulmonary Disease

## 2018-01-21 NOTE — Telephone Encounter (Signed)
Called and spoke with patient, she stated that she was in to see Eustaquio Maize and was told that she would be given a new PCP since hers had moved to [place]. She would like to know where she needs to go. I advised patient that the referral was sent to [facility] for internal medicine. Patient stated that the only physician that could see her there would be a NP who was taking new patients. She stated that she has not heard anything else about this. Advised patient that I would call [facility] to see what is going on.   Called and spoke with Judson Roch from [facility] she stated that she does not see where the patient has tried to get an appointment. She does see where the referral was received and she is ready for an appointment. Judson Roch stated that she will give her a call and get an appointment set up.   Patient is aware of this. Nothing further needed.

## 2018-01-25 DIAGNOSIS — H903 Sensorineural hearing loss, bilateral: Secondary | ICD-10-CM | POA: Diagnosis not present

## 2018-02-14 ENCOUNTER — Ambulatory Visit (INDEPENDENT_AMBULATORY_CARE_PROVIDER_SITE_OTHER): Payer: Medicare Other | Admitting: Nurse Practitioner

## 2018-02-14 ENCOUNTER — Encounter: Payer: Self-pay | Admitting: Nurse Practitioner

## 2018-02-14 VITALS — BP 136/84 | HR 86 | Temp 98.1°F | Ht 66.0 in | Wt 107.0 lb

## 2018-02-14 DIAGNOSIS — E785 Hyperlipidemia, unspecified: Secondary | ICD-10-CM | POA: Diagnosis not present

## 2018-02-14 DIAGNOSIS — K219 Gastro-esophageal reflux disease without esophagitis: Secondary | ICD-10-CM | POA: Diagnosis not present

## 2018-02-14 DIAGNOSIS — M949 Disorder of cartilage, unspecified: Secondary | ICD-10-CM | POA: Diagnosis not present

## 2018-02-14 DIAGNOSIS — E871 Hypo-osmolality and hyponatremia: Secondary | ICD-10-CM

## 2018-02-14 DIAGNOSIS — G2581 Restless legs syndrome: Secondary | ICD-10-CM

## 2018-02-14 DIAGNOSIS — G7100 Muscular dystrophy, unspecified: Secondary | ICD-10-CM

## 2018-02-14 DIAGNOSIS — R002 Palpitations: Secondary | ICD-10-CM

## 2018-02-14 DIAGNOSIS — M899 Disorder of bone, unspecified: Secondary | ICD-10-CM

## 2018-02-14 DIAGNOSIS — Z85828 Personal history of other malignant neoplasm of skin: Secondary | ICD-10-CM

## 2018-02-14 DIAGNOSIS — G8929 Other chronic pain: Secondary | ICD-10-CM

## 2018-02-14 DIAGNOSIS — M545 Low back pain, unspecified: Secondary | ICD-10-CM

## 2018-02-14 NOTE — Patient Instructions (Signed)
To follow up in 1 month for AWV with Janett Billow- make a 9 am appt for this so she can get fasting blood work at the same time.

## 2018-02-14 NOTE — Progress Notes (Signed)
Careteam: Patient Care Team: Lauree Chandler, NP as PCP - General (Geriatric Medicine) Deliah Goody, PA-C as Consulting Physician (Physician Assistant) Danella Sensing, MD as Consulting Physician (Dermatology) Everett Graff, MD as Consulting Physician (Obstetrics and Gynecology) Vicie Mutters, MD as Consulting Physician (Otolaryngology) Marshell Garfinkel, MD as Consulting Physician (Pulmonary Disease)  Advanced Directive information Does Patient Have a Medical Advance Directive?: No(working on updated forms)  Allergies  Allergen Reactions  . Biaxin [Clarithromycin] Anaphylaxis  . Sulfonamide Derivatives Itching and Rash  . Tequin [Gatifloxacin] Anaphylaxis  . Demerol Nausea And Vomiting  . Lactose Intolerance (Gi) Other (See Comments)    Reaction:  Abdominal pain, gas, bloating  . Nsaids Other (See Comments)    Reaction:  GI upset/heartburn   . Risedronate Sodium Nausea And Vomiting and Other (See Comments)    Reaction:  GI upset/heartburn   . Rofecoxib Nausea And Vomiting and Other (See Comments)    Reaction:  GI upset/heartburn   . Alendronate Sodium Nausea And Vomiting  . Aspirin Other (See Comments)    Reaction:  Abdominal pain   . Fish Allergy Nausea And Vomiting  . Benadryl [Diphenhydramine Hcl] Palpitations    Chief Complaint  Patient presents with  . Establish Care    Pt is being seen to establish care.      HPI: Patient is a 72 y.o. female seen in the office today to establish care.  Pt is a former Software engineer.   Osteopenia- due for DEXA- seeing GYN who orders DEXA- taking calcium with Vit D 2000 units daily, walks as much as she can  Tachycardia- previously followed by cardiologist, if her HR is over 140 she takes atenolol.   Vertigo- severe this past summer, went to PT, thought to be due to Migraines.   MD-  Using floricet only for severe pain when robaxin (using TID) does not help. Also using baclofen at bedtime. Neurologist is  prescribing muscle relaxer. Seeing Dr Gayla Medicus neurologist in [place], seeing [facility].   Chronic back pain- using muscle relaxer and gabapentin. Unsure why she is still taking gabapentin. Laminectomy in 2014   RLS- will use tylenol to settle pain if other medications not effective, using 3-4 times a week.   Migraines- currently on Depakote which prevents migraines and therefore improved vertigo. Has maxalt PRN which she does not need often   Hair thinning- using biotin and evening primrose oil.   Hot flashes/night sweats- using estradiol ring, has attempted to stop but continued to have symptoms and had to restart, GYN filling this.   Seeing Pharmacy Tech at [facility]- using natural supplements in combination with prescribed medications.   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   Constipation- using magnesium citrate and will use miralax as needed   GERD- taking omeprazole BID, unsure if she needs it twice daily due to stomach upset.   Hx of diverticulitis- 1 episode   Lot of nausea and stomach issues- will use zofran if she feels nauseous.   Also on vit D and vegetable capsules for supplement.   Hx of basal cell cancer- following with dermatology yearly  Review of Systems:  Review of Systems  Constitutional: Positive for malaise/fatigue.  HENT: Positive for hearing loss and tinnitus.   Eyes:       Dry  Respiratory: Negative for cough, sputum production and shortness of breath.        "  low lung volumes"  Cardiovascular: Negative for chest pain, palpitations and orthopnea.  Gastrointestinal: Positive for nausea (occasionally). Negative for abdominal pain, constipation, diarrhea and vomiting.  Genitourinary: Positive for urgency.  Musculoskeletal:       Limb girdle Muscular dystropy   Skin:       dry  Neurological: Positive for tingling and headaches.    Endo/Heme/Allergies: Positive for environmental allergies.  Psychiatric/Behavioral: Negative for depression and memory loss. The patient does not have insomnia.     Past Medical History:  Diagnosis Date  . Bronchitis   . Cancer ([place]) 2009   Basal Cell  . Diverticulosis 2010  . Endometriosis   . Hearing loss   . Low sodium levels   . MD (muscular dystrophy) ([place])   . Migraine   . [facility]    [facility]  . Peripheral neuropathy    Dr Erling Cruz  . Pneumonia   . Vertigo 2019   Past Surgical History:  Procedure Laterality Date  . ABDOMINAL HYSTERECTOMY  1980 or 1981   for Endometriosis  . 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 BREAST ENHANCEMENT SURGERY Bilateral 1979  . COLONOSCOPY  01/2012   Tics; Dr Teena Irani  . ESI      X 3 in 2003 & 02/17/2011 for L4-S1 symptoms  . LUMBAR LAMINECTOMY  12/2012   [facility] [place]  . TONSILLECTOMY  1950 or 1951  . TUBAL LIGATION    . vocal cords stripped  1970   Social History:   reports that she has never smoked. She has never used smokeless tobacco. She reports current alcohol use of about 2.0 standard drinks of alcohol per week. She reports that she does not use drugs.  Family History  Problem Relation Age of Onset  . Alcohol abuse Mother   . Hypertension Mother   . Diabetes Mother 25  . Stroke Mother 77  . Dementia Mother   . Kidney failure Mother   . Osteoporosis Mother   . Alzheimer's disease Father 67  . Cancer Maternal Aunt 80       pancreatic  . Arthritis Sister 40       knee replacement  . Hyperlipidemia Sister   . Lung disease Maternal Grandmother   . Lung disease Maternal Grandfather   . Hyperlipidemia Sister 49  . Other Son        recent back injury  . Other Daughter        recent back injury; knee issues when playing tennis  . Heart disease Neg Hx   . Heart attack Neg Hx     Medications: Patient's Medications  New Prescriptions   No medications on file  Previous  Medications   ACETAMINOPHEN (TYLENOL) 500 MG TABLET    Take 1,000 mg by mouth at bedtime.    ATENOLOL (TENORMIN) 25 MG TABLET    Take 25 mg by mouth as needed (for severe tachycardia lasting more then 20 minutes).   BACLOFEN (LIORESAL) 10 MG TABLET    Take 10 mg by mouth at bedtime.    BIOTIN 5000 MCG TABS    Take 5,000 mcg by mouth daily.    BUTALBITAL-ACETAMINOPHEN-CAFFEINE (FIORICET, ESGIC) 50-325-40 MG TABLET    Take 1 tablet by mouth every 4 (four) hours as needed (for severe pain).   CALCIUM PO    Take 400 mg by mouth 2 (two) times daily.   CARBOXYMETHYLCELLULOSE SODIUM (THERATEARS) 0.25 % SOLN    Apply  1 drop to eye 3 (three) times daily.   CHOLECALCIFEROL (VITAMIN D3) LIQD    Place 2,000 Units under the tongue daily.   DIVALPROEX (DEPAKOTE ER) 250 MG 24 HR TABLET    Take 500 mg by mouth at bedtime.    ESTRADIOL ACETATE (FEMRING) 0.05 MG/24HR RING    Place 1 each vaginally every 3 (three) months.   EVENING PRIMROSE OIL 1000 MG CAPS    Take 1,000 mg by mouth daily.   GABAPENTIN (NEURONTIN) 100 MG CAPSULE    Take 200 mg by mouth at bedtime.    LACTASE (LACTAID) 3000 UNITS TABLET    Take 3,000 Units by mouth 3 (three) times daily as needed (when eating dairy products).   LUTEIN 20 MG TABS    Take 20 mg by mouth daily.    MAGNESIUM PO    Take 300 mg by mouth at bedtime.   METHOCARBAMOL (ROBAXIN) 750 MG TABLET    Take 750 mg by mouth every 6 (six) hours as needed for muscle spasms.    MULTIPLE VITAMIN (MULTIVITAMIN WITH MINERALS) TABS TABLET    Take 1 tablet by mouth daily.   MULTIPLE VITAMINS-MINERALS (PRESERVISION AREDS 2) CAPS    Take 1 capsule by mouth 2 (two) times daily.   OMEGA-3 1000 MG CAPS    Take 1 capsule by mouth 2 (two) times daily.   OMEPRAZOLE PO    Take 20 mg by mouth 2 (two) times daily.   ONDANSETRON (ZOFRAN ODT) 4 MG DISINTEGRATING TABLET    4mg  ODT q6 hours prn nausea/vomit   POLYETHYLENE GLYCOL (MIRALAX / GLYCOLAX) PACKET    Take 17 g by mouth daily.   RIZATRIPTAN  (MAXALT-MLT) 10 MG DISINTEGRATING TABLET    Take 10 mg by mouth as needed for migraine.    TETRAHYDROZOLINE 0.05 % OPHTHALMIC SOLUTION    Place 1 drop into both eyes as needed (for eye redness).   VITAMIN E 400 UNIT CAPSULE    Take 400 Units by mouth daily.  Modified Medications   No medications on file  Discontinued Medications   FLUTICASONE (FLONASE) 50 MCG/ACT NASAL SPRAY    Place 2 sprays into both nostrils daily.   LORATADINE (CLARITIN) 10 MG TABLET    Take 10 mg by mouth daily as needed for allergies.      Physical Exam:  Vitals:   02/14/18 0907  BP: 136/84  Pulse: 86  Temp: 98.1 F (36.7 C)  TempSrc: Oral  SpO2: 98%  Weight: 107 lb (48.5 kg)  Height: 5\' 6"  (1.676 m)   Body mass index is 17.27 kg/m.  Physical Exam Constitutional:      General: She is not in acute distress.    Appearance: Normal appearance. She is well-developed, well-groomed and underweight. She is not diaphoretic.  HENT:     Head: Normocephalic and atraumatic.     Mouth/Throat:     Pharynx: No oropharyngeal exudate.  Eyes:     Conjunctiva/sclera: Conjunctivae normal.     Pupils: Pupils are equal, round, and reactive to light.  Neck:     Musculoskeletal: Normal range of motion and neck supple.  Cardiovascular:     Rate and Rhythm: Normal rate and regular rhythm.     Heart sounds: Normal heart sounds.  Pulmonary:     Effort: Pulmonary effort is normal.     Breath sounds: Normal breath sounds.  Abdominal:     General: Bowel sounds are normal.     Palpations: Abdomen is soft.  Musculoskeletal:        General: No tenderness.  Skin:    General: Skin is warm and dry.  Neurological:     Mental Status: She is alert and oriented to person, place, and time.  Psychiatric:        Mood and Affect: Mood normal.     Labs reviewed: Basic Metabolic Panel: No results for input(s): NA, K, CL, CO2, GLUCOSE, BUN, CREATININE, CALCIUM, MG, PHOS, TSH in the last 8760 hours. Liver Function Tests: No  results for input(s): AST, ALT, ALKPHOS, BILITOT, PROT, ALBUMIN in the last 8760 hours. No results for input(s): LIPASE, AMYLASE in the last 8760 hours. No results for input(s): AMMONIA in the last 8760 hours. CBC: No results for input(s): WBC, NEUTROABS, HGB, HCT, MCV, PLT in the last 8760 hours. Lipid Panel: No results for input(s): CHOL, HDL, LDLCALC, TRIG, CHOLHDL, LDLDIRECT in the last 8760 hours. TSH: No results for input(s): TSH in the last 8760 hours. A1C: Lab Results  Component Value Date   HGBA1C 5.6 03/03/2011     Assessment/Plan 1. Disorder of bone and cartilage -osteopenia at this time. Following with GYN for Dexa. Continues on cal and vit d - CBC with Differential/Platelets; Future  2. Hyponatremia - CMP; Future  3. Muscular dystrophy ([place]) Close follow up with neurology, on muscle relaxer regimen that has been working well for her.   4. Hyperlipidemia, unspecified hyperlipidemia type - Lipid Panel; Future - CMP; Future  5. Heart palpitations Occasional, will use PRN beta blocker if lasting more than 20 mins  6. SKIN CANCER, HX OF Followed by dermatology yearly  7. Gastroesophageal reflux disease without esophagitis Controlled on omprazole BID.   8. RLS (restless legs syndrome) Occasionally with worsening of symptoms, overall stable. Will use tylenol PRN for this.   9. Chronic low back pain without sciatica, unspecified back pain laterality Stable on gabapentin at this time. May try to reduce and stop because she is unsure if this is really providing any additional benefit at this time.  Next appt: 1 month for AWV Dashonna Chagnon K. [place], [facility] [phone]

## 2018-03-15 DIAGNOSIS — H8103 Meniere's disease, bilateral: Secondary | ICD-10-CM | POA: Diagnosis not present

## 2018-03-15 DIAGNOSIS — H903 Sensorineural hearing loss, bilateral: Secondary | ICD-10-CM | POA: Diagnosis not present

## 2018-03-15 DIAGNOSIS — G43719 Chronic migraine without aura, intractable, without status migrainosus: Secondary | ICD-10-CM | POA: Diagnosis not present

## 2018-03-19 ENCOUNTER — Encounter: Payer: Self-pay | Admitting: Nurse Practitioner

## 2018-03-19 ENCOUNTER — Other Ambulatory Visit: Payer: Self-pay | Admitting: Nurse Practitioner

## 2018-03-19 ENCOUNTER — Other Ambulatory Visit: Payer: Self-pay | Admitting: *Deleted

## 2018-03-19 ENCOUNTER — Ambulatory Visit (INDEPENDENT_AMBULATORY_CARE_PROVIDER_SITE_OTHER): Payer: Medicare Other | Admitting: Nurse Practitioner

## 2018-03-19 VITALS — BP 120/80 | HR 72 | Temp 98.0°F | Ht 65.5 in | Wt 107.0 lb

## 2018-03-19 DIAGNOSIS — Z0000 Encounter for general adult medical examination without abnormal findings: Secondary | ICD-10-CM

## 2018-03-19 DIAGNOSIS — E559 Vitamin D deficiency, unspecified: Secondary | ICD-10-CM

## 2018-03-19 DIAGNOSIS — E871 Hypo-osmolality and hyponatremia: Secondary | ICD-10-CM | POA: Diagnosis not present

## 2018-03-19 DIAGNOSIS — M899 Disorder of bone, unspecified: Secondary | ICD-10-CM

## 2018-03-19 DIAGNOSIS — M949 Disorder of cartilage, unspecified: Secondary | ICD-10-CM | POA: Diagnosis not present

## 2018-03-19 DIAGNOSIS — E785 Hyperlipidemia, unspecified: Secondary | ICD-10-CM

## 2018-03-19 NOTE — Patient Instructions (Addendum)
Alicia [name] , Thank you for taking time to come for your Medicare Wellness Visit. I appreciate your ongoing commitment to your health goals. Please review the following plan we discussed and let me know if I can assist you in the future.   Screening recommendations/referrals: Colonoscopy - up to date Mammogram - declines Bone Density -up to date Recommended yearly ophthalmology/optometry visit for glaucoma screening and checkup Recommended yearly dental visit for hygiene and checkup  Vaccinations: Influenza vaccine -up to date Pneumococcal vaccine -up to date Tdap vaccine - up to date  Shingles vaccine - dont for get your second dose   Advanced directives: to complete and bring back to office   Conditions/risks identified: to increase activity.   Follow up in 3 months with Dr Mariea Clonts for Routine follow up. (or sooner) Next appointment: 1 year for AWV    Preventive Care 69 Years and Older, Female Preventive care refers to lifestyle choices and visits with your health care provider that can promote health and wellness. What does preventive care include?  A yearly physical exam. This is also called an annual well check.  Dental exams once or twice a year.  Routine eye exams. Ask your health care provider how often you should have your eyes checked.  Personal lifestyle choices, including:  Daily care of your teeth and gums.  Regular physical activity.  Eating a healthy diet.  Avoiding tobacco and drug use.  Limiting alcohol use.  Practicing safe sex.  Taking low-dose aspirin every day.  Taking vitamin and mineral supplements as recommended by your health care provider. What happens during an annual well check? The services and screenings done by your health care provider during your annual well check will depend on your age, overall health, lifestyle risk factors, and family history of disease. Counseling  Your health care provider may ask you questions about  your:  Alcohol use.  Tobacco use.  Drug use.  Emotional well-being.  Home and relationship well-being.  Sexual activity.  Eating habits.  History of falls.  Memory and ability to understand (cognition).  Work and work Statistician.  Reproductive health. Screening  You may have the following tests or measurements:  Height, weight, and BMI.  Blood pressure.  Lipid and cholesterol levels. These may be checked every 5 years, or more frequently if you are over 87 years old.  Skin check.  Lung cancer screening. You may have this screening every year starting at age 32 if you have a 30-pack-year history of smoking and currently smoke or have quit within the past 15 years.  Fecal occult blood test (FOBT) of the stool. You may have this test every year starting at age 48.  Flexible sigmoidoscopy or colonoscopy. You may have a sigmoidoscopy every 5 years or a colonoscopy every 10 years starting at age 73.  Hepatitis C blood test.  Hepatitis B blood test.  Sexually transmitted disease (STD) testing.  Diabetes screening. This is done by checking your blood sugar (glucose) after you have not eaten for a while (fasting). You may have this done every 1-3 years.  Bone density scan. This is done to screen for osteoporosis. You may have this done starting at age 43.  Mammogram. This may be done every 1-2 years. Talk to your health care provider about how often you should have regular mammograms. Talk with your health care provider about your test results, treatment options, and if necessary, the need for more tests. Vaccines  Your health care provider may recommend  certain vaccines, such as:  Influenza vaccine. This is recommended every year.  Tetanus, diphtheria, and acellular pertussis (Tdap, Td) vaccine. You may need a Td booster every 10 years.  Zoster vaccine. You may need this after age 68.  Pneumococcal 13-valent conjugate (PCV13) vaccine. One dose is recommended  after age 52.  Pneumococcal polysaccharide (PPSV23) vaccine. One dose is recommended after age 33. Talk to your health care provider about which screenings and vaccines you need and how often you need them. This information is not intended to replace advice given to you by your health care provider. Make sure you discuss any questions you have with your health care provider. Document Released: 03/19/2015 Document Revised: 11/10/2015 Document Reviewed: 12/22/2014 Elsevier Interactive Patient Education  2017 [employer] Prevention in the Home Falls can cause injuries. They can happen to people of all ages. There are many things you can do to make your home safe and to help prevent falls. What can I do on the outside of my home?  Regularly fix the edges of walkways and driveways and fix any cracks.  Remove anything that might make you trip as you walk through a door, such as a raised step or threshold.  Trim any bushes or trees on the path to your home.  Use bright outdoor lighting.  Clear any walking paths of anything that might make someone trip, such as rocks or tools.  Regularly check to see if handrails are loose or broken. Make sure that both sides of any steps have handrails.  Any raised decks and porches should have guardrails on the edges.  Have any leaves, snow, or ice cleared regularly.  Use sand or salt on walking paths during winter.  Clean up any spills in your garage right away. This includes oil or grease spills. What can I do in the bathroom?  Use night lights.  Install grab bars by the toilet and in the tub and shower. Do not use towel bars as grab bars.  Use non-skid mats or decals in the tub or shower.  If you need to sit down in the shower, use a plastic, non-slip stool.  Keep the floor dry. Clean up any water that spills on the floor as soon as it happens.  Remove soap buildup in the tub or shower regularly.  Attach bath mats securely with  double-sided non-slip rug tape.  Do not have throw rugs and other things on the floor that can make you trip. What can I do in the bedroom?  Use night lights.  Make sure that you have a light by your bed that is easy to reach.  Do not use any sheets or blankets that are too big for your bed. They should not hang down onto the floor.  Have a firm chair that has side arms. You can use this for support while you get dressed.  Do not have throw rugs and other things on the floor that can make you trip. What can I do in the kitchen?  Clean up any spills right away.  Avoid walking on wet floors.  Keep items that you use a lot in easy-to-reach places.  If you need to reach something above you, use a strong step stool that has a grab bar.  Keep electrical cords out of the way.  Do not use floor polish or wax that makes floors slippery. If you must use wax, use non-skid floor wax.  Do not have throw rugs and other  things on the floor that can make you trip. What can I do with my stairs?  Do not leave any items on the stairs.  Make sure that there are handrails on both sides of the stairs and use them. Fix handrails that are broken or loose. Make sure that handrails are as long as the stairways.  Check any carpeting to make sure that it is firmly attached to the stairs. Fix any carpet that is loose or worn.  Avoid having throw rugs at the top or bottom of the stairs. If you do have throw rugs, attach them to the floor with carpet tape.  Make sure that you have a light switch at the top of the stairs and the bottom of the stairs. If you do not have them, ask someone to add them for you. What else can I do to help prevent falls?  Wear shoes that:  Do not have high heels.  Have rubber bottoms.  Are comfortable and fit you well.  Are closed at the toe. Do not wear sandals.  If you use a stepladder:  Make sure that it is fully opened. Do not climb a closed stepladder.  Make  sure that both sides of the stepladder are locked into place.  Ask someone to hold it for you, if possible.  Clearly mark and make sure that you can see:  Any grab bars or handrails.  First and last steps.  Where the edge of each step is.  Use tools that help you move around (mobility aids) if they are needed. These include:  Canes.  Walkers.  Scooters.  Crutches.  Turn on the lights when you go into a dark area. Replace any light bulbs as soon as they burn out.  Set up your furniture so you have a clear path. Avoid moving your furniture around.  If any of your floors are uneven, fix them.  If there are any pets around you, be aware of where they are.  Review your medicines with your doctor. Some medicines can make you feel dizzy. This can increase your chance of falling. Ask your doctor what other things that you can do to help prevent falls. This information is not intended to replace advice given to you by your health care provider. Make sure you discuss any questions you have with your health care provider. Document Released: 12/17/2008 Document Revised: 07/29/2015 Document Reviewed: 03/27/2014 Elsevier Interactive Patient Education  2017 [employer].

## 2018-03-19 NOTE — Progress Notes (Signed)
Subjective:   Alicia [name] is a 73 y.o. female who presents for Medicare Annual (Subsequent) preventive examination.  Review of Systems:   Cardiac Risk Factors include: dyslipidemia;advanced age (>79men, >30 women)     Objective:     Vitals: BP 120/80   Pulse 72   Temp 98 F (36.7 C) (Oral)   Ht 5' 5.5" (1.664 m)   Wt 107 lb (48.5 kg)   SpO2 98%   BMI 17.53 kg/m   Body mass index is 17.53 kg/m.  Advanced Directives 03/19/2018 02/14/2018 10/24/2015 10/15/2014  Does Patient Have a Medical Advance Directive? No No No No  Would patient like information on creating a medical advance directive? Yes (MAU/Ambulatory/Procedural Areas - Information given) - No - patient declined information No - patient declined information    Tobacco Social History   Tobacco Use  Smoking Status Never Smoker  Smokeless Tobacco Never Used     Counseling given: Not Answered   Clinical Intake:  Pre-visit preparation completed: Yes  Pain : No/denies pain     BMI - recorded: 17.53 Nutritional Status: BMI <19  Underweight Nutritional Risks: Other (Comment)(hx of MD) Diabetes: No  How often do you need to have someone help you when you read instructions, pamphlets, or other written materials from your doctor or pharmacy?: 1 - Never What is the last grade level you completed in school?: [employer] 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   Interpreter Needed?: No     Past Medical History:  Diagnosis Date  . Bronchitis   . 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 Chronic migraine without aura, intractable, without status migrainosus   . Diverticulosis 2010  . Endometriosis   . Low sodium levels   . MD (muscular dystrophy) ([place])   . Meniere's disease, bilateral   . [facility]  . Peripheral neuropathy    Dr Erling Cruz  . Pneumonia   . Sensorineural hearing loss, bilateral   . Vertigo 2019   Past Surgical History:  Procedure Laterality Date   . ABDOMINAL HYSTERECTOMY  1980 or 1981   for Endometriosis  . 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 BREAST ENHANCEMENT SURGERY Bilateral 1979  . COLONOSCOPY  01/2012   Tics; Dr Teena Irani  . ESI      X 3 in 2003 & 02/17/2011 for L4-S1 symptoms  . LUMBAR LAMINECTOMY  12/2012   [place] , [place]  . TONSILLECTOMY  1950 or 1951  . TUBAL LIGATION    . vocal cords stripped  1970   Family History  Problem Relation Age of Onset  . Alcohol abuse Mother   . Hypertension Mother   . Diabetes Mother 35  . Stroke Mother 28  . Dementia Mother   . Kidney failure Mother   . Osteoporosis Mother   . Alzheimer's disease Father 61  . Cancer Maternal Aunt 80       pancreatic  . Arthritis Sister 79       knee replacement  . Hyperlipidemia Sister   . Lung disease Maternal Grandmother   . Lung disease Maternal Grandfather   . Hyperlipidemia Sister 22  . Other Son        recent back injury  . Other Daughter        recent back injury; knee issues when playing tennis  . Heart disease Neg Hx   . Heart attack Neg Hx  Social History   Socioeconomic History  . Marital status: Married    Spouse name: Not on file  . Number of children: Not on file  . Years of education: Not on file  . Highest education level: Not on file  Occupational History  . Not on file  Social Needs  . Financial resource strain: Not on file  . Food insecurity:    Worry: Not on file    Inability: Not on file  . Transportation needs:    Medical: Not on file    Non-medical: Not on file  Tobacco Use  . Smoking status: Never Smoker  . Smokeless tobacco: Never Used  Substance and Sexual Activity  . Alcohol use: Yes    Alcohol/week: 2.0 standard drinks    Types: 2 Glasses of wine per week    Comment: 1/2 glass wine with meals 2 -3 times weekly- maybe  . Drug use: No  . Sexual activity: Not Currently  Lifestyle  . Physical activity:    Days per week: Not on file    Minutes per session: Not on  file  . Stress: Not on file  Relationships  . Social connections:    Talks on phone: Not on file    Gets together: Not on file    Attends religious service: Not on file    Active member of club or organization: Not on file    Attends meetings of clubs or organizations: Not on file    Relationship status: Not on file  Other Topics Concern  . Not on file  Social History Narrative   Social History      Diet? Trying to gain weight      Do you drink/eat things with caffeine? 1 mug hot tea with breakfast per day      Marital status?  Married 77 years                                  What year were you married? 1970      Do you live in a house, apartment, assisted living, condo, trailer, etc.? House (46 years)      Is it one or more stories? 2 stories and walk up finished attic      How many persons live in your home? 2      Do you have any pets in your home? (please list) none      Highest level of education completed? 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     yes                                 Type & how often? Walk in neighborhood 4 - 6 times per week if possible      Advanced Directives (Lawyer just beginning to work on these things)      Do you have a living will?      Do you have a DNR form?                    no              If not, do you want to discuss one?      Do you  have signed POA/HPOA for forms?       Functional Status      Do you have difficulty bathing or dressing yourself? no      Do you have difficulty preparing food or eating? no      Do you have difficulty managing your medications? no      Do you have difficulty managing your finances? no      Do you have difficulty affording your medications? no    Outpatient Encounter Medications as of 03/19/2018  Medication Sig  . acetaminophen (TYLENOL) 500 MG tablet Take 1,000 mg by mouth at  bedtime.   Marland Kitchen atenolol (TENORMIN) 25 MG tablet Take 25 mg by mouth as needed (for severe tachycardia lasting more then 20 minutes).  . baclofen (LIORESAL) 10 MG tablet Take 10 mg by mouth at bedtime.   . Biotin 5000 MCG TABS Take 5,000 mcg by mouth daily.   . butalbital-acetaminophen-caffeine (FIORICET, ESGIC) 50-325-40 MG tablet Take 1 tablet by mouth every 4 (four) hours as needed (for severe pain).  . CALCIUM PO Take 400 mg by mouth 2 (two) times daily.  . Carboxymethylcellulose Sodium (THERATEARS) 0.25 % SOLN Apply 1 drop to eye 3 (three) times daily.  . Cholecalciferol (VITAMIN D3) LIQD Place 2,000 Units under the tongue daily.  . divalproex (DEPAKOTE ER) 250 MG 24 hr tablet Take 500 mg by mouth at bedtime.   . Estradiol Acetate (FEMRING) 0.05 MG/24HR RING Place 1 each vaginally every 3 (three) months.  . Evening Primrose Oil 1000 MG CAPS Take 1,000 mg by mouth daily.  Marland Kitchen gabapentin (NEURONTIN) 100 MG capsule Take 200 mg by mouth at bedtime.   Marland Kitchen lactase (LACTAID) 3000 units tablet Take 3,000 Units by mouth 3 (three) times daily as needed (when eating dairy products).  . Lutein 20 MG TABS Take 20 mg by mouth daily.   Marland Kitchen MAGNESIUM PO Take 300 mg by mouth at bedtime.  . methocarbamol (ROBAXIN) 750 MG tablet Take 750 mg by mouth every 6 (six) hours as needed for muscle spasms.   . Multiple Vitamin (MULTIVITAMIN WITH MINERALS) TABS tablet Take 1 tablet by mouth daily.  . Multiple Vitamins-Minerals (PRESERVISION AREDS 2) CAPS Take 1 capsule by mouth 2 (two) times daily.  . Omega-3 1000 MG CAPS Take 1 capsule by mouth 2 (two) times daily.  Marland Kitchen OMEPRAZOLE PO Take 20 mg by mouth 2 (two) times daily.  . ondansetron (ZOFRAN ODT) 4 MG disintegrating tablet 4mg  ODT q6 hours prn nausea/vomit  . polyethylene glycol (MIRALAX / GLYCOLAX) packet Take 17 g by mouth daily.  . rizatriptan (MAXALT-MLT) 10 MG disintegrating tablet Take 10 mg by mouth as needed for migraine.   Marland Kitchen tetrahydrozoline 0.05 % ophthalmic  solution Place 1 drop into both eyes as needed (for eye redness).  . vitamin E 400 UNIT capsule Take 400 Units by mouth daily.   No facility-administered encounter medications on file as of 03/19/2018.     Activities of Daily Living In your present state of health, do you have any difficulty performing the following activities: 03/19/2018  Hearing? Y  Comment getting new hearing aids  Vision? N  Comment early stages of Macular degeneration   Difficulty concentrating or making decisions? N  Walking or climbing stairs? N  Dressing or bathing? N  Doing errands, shopping? N  Preparing Food and eating ? N  Using the Toilet? N  In the past six months, have you accidently leaked urine? Y  Comment small amout of  leakage  Do you have problems with loss of bowel control? N  Managing your Medications? N  Managing your Finances? N  Housekeeping or managing your Housekeeping? Y  Comment able to manage most of housekeeping, husband helps  Some recent data might be hidden    Patient Care Team: Lauree Chandler, NP as PCP - General (Geriatric Medicine) Deliah Goody, PA-C as Consulting Physician (Physician Assistant) Danella Sensing, MD as Consulting Physician (Dermatology) Everett Graff, MD as Consulting Physician (Obstetrics and Gynecology) Vicie Mutters, MD as Consulting Physician (Otolaryngology) Marshell Garfinkel, MD as Consulting Physician (Pulmonary Disease)    Assessment:   This is a routine wellness examination for [place].  Exercise Activities and Dietary recommendations Current Exercise Habits: Home exercise routine, Type of exercise: walking, Time (Minutes): 20, Frequency (Times/Week): 3, Weekly Exercise (Minutes/Week): 60, Intensity: Moderate  Goals    . Exercise 150 min/wk Moderate Activity     To increase exercise to 5 days a week, 20 mins a day.    . Patient Stated     To get her hearing "straighten out" get better hearing aids to hear others.        Fall Risk Fall  Risk  03/19/2018 02/14/2018 08/20/2012  Falls in the past year? 0 0 Yes  Number falls in past yr: 0 - 1  Injury with Fall? 0 - Yes  Comment - - June 2013- Cracked bone    Is the patient's home free of loose throw rugs in walkways, pet beds, electrical cords, etc?   no      Grab bars in the bathroom? no      Handrails on the stairs?   yes      Adequate lighting?   yes  Timed Get Up and Go performed: n/1  Depression Screen PHQ 2/9 Scores 03/19/2018 02/14/2018 08/20/2012  PHQ - 2 Score 0 0 0     Cognitive Function MMSE - Mini Mental State Exam 03/19/2018  Orientation to time 5  Orientation to Place 5  Registration 3  Attention/ Calculation 5  Recall 3  Language- name 2 objects 2  Language- repeat 1  Language- follow 3 step command 3  Language- read & follow direction 1  Write a sentence 1  Copy design 1  Total score 30        Immunization History  Administered Date(s) Administered  . Influenza Whole 01/27/2008  . Influenza, High Dose Seasonal PF 12/05/2013, 01/25/2018  . Pneumococcal Conjugate-13 11/19/2013  . Pneumococcal Polysaccharide-23 02/14/2018  . Td 01/03/2005  . Tdap 01/03/2017  . Zoster 02/23/2009    Qualifies for Shingles Vaccine?yes, due for booster  Screening Tests Health Maintenance  Topic Date Due  . Hepatitis C Screening  09/16/1945  . COLONOSCOPY  03/03/1995  . TETANUS/TDAP  01/04/2027  . INFLUENZA VACCINE  Completed  . DEXA SCAN  Completed  . PNA vac Low Risk Adult  Completed  . MAMMOGRAM  Discontinued    Cancer Screenings: Lung: Low Dose CT Chest recommended if Age 61-80 years, 30 pack-year currently smoking OR have quit w/in 15years. Patient does not qualify. Breast:  Up to date on Mammogram? No  Does breast exam and Dr Mancel Bale (GYN) does annual exam, hx of breast implants and had rupture of implant  Up to date of Bone Density/Dexa? Yes Colorectal: on 10 year schedule, Saw Dr Amedeo Plenty, due in 2023  Additional Screenings:  Hepatitis C  Screening: low risk, declines screening.      Plan:    I  have personally reviewed and noted the following in the patient's chart:   . Medical and social history . Use of alcohol, tobacco or illicit drugs  . Current medications and supplements . Functional ability and status . Nutritional status . Physical activity . Advanced directives . List of other physicians . Hospitalizations, surgeries, and ER visits in previous 12 months . Vitals . Screenings to include cognitive, depression, and falls . Referrals and appointments  In addition, I have reviewed and discussed with patient certain preventive protocols, quality metrics, and best practice recommendations. A written personalized care plan for preventive services as well as general preventive health recommendations were provided to patient.     Lauree Chandler, NP  03/19/2018

## 2018-03-20 LAB — LIPID PANEL
Cholesterol: 192 mg/dL (ref <200)
HDL: 88 mg/dL (ref >50)
LDL Cholesterol (Calc): 91 mg/dL (calc)
NON-HDL CHOLESTEROL (CALC): 104 mg/dL (ref <130)
Total CHOL/HDL Ratio: 2.2 (calc) (ref <5.0)
Triglycerides: 52 mg/dL (ref <150)

## 2018-03-20 LAB — CBC WITH DIFFERENTIAL/PLATELET
Absolute Monocytes: 447 cells/uL (ref 200–950)
BASOS PCT: 0.7 %
Basophils Absolute: 30 cells/uL (ref 0–200)
Eosinophils Absolute: 60 cells/uL (ref 15–500)
Eosinophils Relative: 1.4 %
HCT: 39.5 % (ref 35.0–45.0)
Hemoglobin: 13.8 g/dL (ref 11.7–15.5)
Lymphs Abs: 851 cells/uL (ref 850–3900)
MCH: 31.4 pg (ref 27.0–33.0)
MCHC: 34.9 g/dL (ref 32.0–36.0)
MCV: 90 fL (ref 80.0–100.0)
MPV: 11 fL (ref 7.5–12.5)
Monocytes Relative: 10.4 %
Neutro Abs: 2911 cells/uL (ref 1500–7800)
Neutrophils Relative %: 67.7 %
PLATELETS: 211 10*3/uL (ref 140–400)
RBC: 4.39 10*6/uL (ref 3.80–5.10)
RDW: 12.6 % (ref 11.0–15.0)
TOTAL LYMPHOCYTE: 19.8 %
WBC: 4.3 10*3/uL (ref 3.8–10.8)

## 2018-03-20 LAB — VITAMIN D 25 HYDROXY (VIT D DEFICIENCY, FRACTURES): Vit D, 25-Hydroxy: 91 ng/mL (ref 30–100)

## 2018-03-20 LAB — COMPREHENSIVE METABOLIC PANEL
AG Ratio: 1.6 (calc) (ref 1.0–2.5)
ALT: 67 U/L — ABNORMAL HIGH (ref 6–29)
AST: 57 U/L — ABNORMAL HIGH (ref 10–35)
Albumin: 4.1 g/dL (ref 3.6–5.1)
Alkaline phosphatase (APISO): 64 U/L (ref 33–130)
BUN/Creatinine Ratio: 22 (calc) (ref 6–22)
BUN: 11 mg/dL (ref 7–25)
CO2: 32 mmol/L (ref 20–32)
Calcium: 9.5 mg/dL (ref 8.6–10.4)
Chloride: 94 mmol/L — ABNORMAL LOW (ref 98–110)
Creat: 0.51 mg/dL — ABNORMAL LOW (ref 0.60–0.93)
Globulin: 2.5 g/dL (calc) (ref 1.9–3.7)
Glucose, Bld: 89 mg/dL (ref 65–99)
Potassium: 5 mmol/L (ref 3.5–5.3)
Sodium: 131 mmol/L — ABNORMAL LOW (ref 135–146)
Total Bilirubin: 0.5 mg/dL (ref 0.2–1.2)
Total Protein: 6.6 g/dL (ref 6.1–8.1)

## 2018-03-21 ENCOUNTER — Other Ambulatory Visit: Payer: Self-pay | Admitting: Internal Medicine

## 2018-03-26 ENCOUNTER — Other Ambulatory Visit: Payer: Self-pay

## 2018-03-26 DIAGNOSIS — R748 Abnormal levels of other serum enzymes: Secondary | ICD-10-CM

## 2018-04-02 DIAGNOSIS — H5203 Hypermetropia, bilateral: Secondary | ICD-10-CM | POA: Diagnosis not present

## 2018-04-02 DIAGNOSIS — H04123 Dry eye syndrome of bilateral lacrimal glands: Secondary | ICD-10-CM | POA: Diagnosis not present

## 2018-04-02 DIAGNOSIS — H25813 Combined forms of age-related cataract, bilateral: Secondary | ICD-10-CM | POA: Diagnosis not present

## 2018-04-02 DIAGNOSIS — H3552 Pigmentary retinal dystrophy: Secondary | ICD-10-CM | POA: Diagnosis not present

## 2018-04-15 DIAGNOSIS — H8103 Meniere's disease, bilateral: Secondary | ICD-10-CM | POA: Diagnosis not present

## 2018-04-15 DIAGNOSIS — H903 Sensorineural hearing loss, bilateral: Secondary | ICD-10-CM | POA: Diagnosis not present

## 2018-04-25 DIAGNOSIS — G35 Multiple sclerosis: Secondary | ICD-10-CM | POA: Diagnosis not present

## 2018-04-25 DIAGNOSIS — G43111 Migraine with aura, intractable, with status migrainosus: Secondary | ICD-10-CM | POA: Diagnosis not present

## 2018-04-25 DIAGNOSIS — G7100 Muscular dystrophy, unspecified: Secondary | ICD-10-CM | POA: Diagnosis not present

## 2018-05-01 ENCOUNTER — Other Ambulatory Visit: Payer: Self-pay | Admitting: Otolaryngology

## 2018-05-01 DIAGNOSIS — G43719 Chronic migraine without aura, intractable, without status migrainosus: Secondary | ICD-10-CM

## 2018-05-01 DIAGNOSIS — H8103 Meniere's disease, bilateral: Secondary | ICD-10-CM

## 2018-05-01 DIAGNOSIS — H903 Sensorineural hearing loss, bilateral: Secondary | ICD-10-CM

## 2018-05-08 ENCOUNTER — Encounter: Payer: Self-pay | Admitting: Nurse Practitioner

## 2018-05-08 ENCOUNTER — Ambulatory Visit (INDEPENDENT_AMBULATORY_CARE_PROVIDER_SITE_OTHER): Payer: Medicare Other | Admitting: Nurse Practitioner

## 2018-05-08 VITALS — BP 132/66 | HR 68 | Temp 97.6°F | Wt 109.0 lb

## 2018-05-08 DIAGNOSIS — G43111 Migraine with aura, intractable, with status migrainosus: Secondary | ICD-10-CM | POA: Diagnosis not present

## 2018-05-08 DIAGNOSIS — R002 Palpitations: Secondary | ICD-10-CM | POA: Diagnosis not present

## 2018-05-08 DIAGNOSIS — G7100 Muscular dystrophy, unspecified: Secondary | ICD-10-CM

## 2018-05-08 DIAGNOSIS — R110 Nausea: Secondary | ICD-10-CM | POA: Diagnosis not present

## 2018-05-08 DIAGNOSIS — R197 Diarrhea, unspecified: Secondary | ICD-10-CM

## 2018-05-08 LAB — CBC WITH DIFFERENTIAL/PLATELET
Absolute Monocytes: 387 cells/uL (ref 200–950)
Basophils Absolute: 22 cells/uL (ref 0–200)
Basophils Relative: 0.5 %
Eosinophils Absolute: 48 cells/uL (ref 15–500)
Eosinophils Relative: 1.1 %
HEMATOCRIT: 39.2 % (ref 35.0–45.0)
Hemoglobin: 13.3 g/dL (ref 11.7–15.5)
LYMPHS ABS: 528 {cells}/uL — AB (ref 850–3900)
MCH: 30.6 pg (ref 27.0–33.0)
MCHC: 33.9 g/dL (ref 32.0–36.0)
MCV: 90.3 fL (ref 80.0–100.0)
MPV: 11.5 fL (ref 7.5–12.5)
Monocytes Relative: 8.8 %
Neutro Abs: 3414 cells/uL (ref 1500–7800)
Neutrophils Relative %: 77.6 %
Platelets: 236 10*3/uL (ref 140–400)
RBC: 4.34 10*6/uL (ref 3.80–5.10)
RDW: 11.8 % (ref 11.0–15.0)
Total Lymphocyte: 12 %
WBC: 4.4 10*3/uL (ref 3.8–10.8)

## 2018-05-08 LAB — COMPLETE METABOLIC PANEL WITH GFR
AG Ratio: 1.6 (calc) (ref 1.0–2.5)
ALBUMIN MSPROF: 3.9 g/dL (ref 3.6–5.1)
ALT: 24 U/L (ref 6–29)
AST: 28 U/L (ref 10–35)
Alkaline phosphatase (APISO): 74 U/L (ref 37–153)
BUN: 10 mg/dL (ref 7–25)
CO2: 32 mmol/L (ref 20–32)
Calcium: 9.2 mg/dL (ref 8.6–10.4)
Chloride: 96 mmol/L — ABNORMAL LOW (ref 98–110)
Creat: 0.63 mg/dL (ref 0.60–0.93)
GFR, Est African American: 103 mL/min/{1.73_m2} (ref >=60)
GFR, Est Non African American: 89 mL/min/{1.73_m2} (ref >=60)
Globulin: 2.5 g/dL (calc) (ref 1.9–3.7)
Glucose, Bld: 95 mg/dL (ref 65–139)
Potassium: 4.3 mmol/L (ref 3.5–5.3)
Sodium: 133 mmol/L — ABNORMAL LOW (ref 135–146)
Total Bilirubin: 0.5 mg/dL (ref 0.2–1.2)
Total Protein: 6.4 g/dL (ref 6.1–8.1)

## 2018-05-08 LAB — CK: CK TOTAL: 46 U/L (ref 29–143)

## 2018-05-08 LAB — AMYLASE: Amylase: 37 U/L (ref 21–101)

## 2018-05-08 LAB — LIPASE: Lipase: 21 U/L (ref 7–60)

## 2018-05-08 MED ORDER — ONDANSETRON 4 MG PO TBDP: ORAL_TABLET | ORAL | 0 refills | Status: DC

## 2018-05-08 NOTE — Progress Notes (Signed)
Careteam: Patient Care Team: Lauree Chandler, NP as PCP - General (Geriatric Medicine) Deliah Goody, PA-C as Consulting Physician (Physician Assistant) Danella Sensing, MD as Consulting Physician (Dermatology) Everett Graff, MD as Consulting Physician (Obstetrics and Gynecology) Vicie Mutters, MD as Consulting Physician (Otolaryngology) Marshell Garfinkel, MD as Consulting Physician (Pulmonary Disease)  Advanced Directive information    Allergies  Allergen Reactions  . Biaxin [Clarithromycin] Anaphylaxis  . Sulfonamide Derivatives Itching and Rash  . Tequin [Gatifloxacin] Anaphylaxis  . Demerol Nausea And Vomiting  . Lactose Intolerance (Gi) Other (See Comments)    Reaction:  Abdominal pain, gas, bloating  . Nsaids Other (See Comments)    Reaction:  GI upset/heartburn   . Risedronate Sodium Nausea And Vomiting and Other (See Comments)    Reaction:  GI upset/heartburn   . Rofecoxib Nausea And Vomiting and Other (See Comments)    Reaction:  GI upset/heartburn   . Alendronate Sodium Nausea And Vomiting  . Aspirin Other (See Comments)    Reaction:  Abdominal pain   . Fish Allergy Nausea And Vomiting  . Benadryl [Diphenhydramine Hcl] Palpitations    Chief Complaint  Patient presents with  . Medical Management of Chronic Issues    Patient states she has been having diarrhea and chills this morning. About 5 BMs watery stools. The heart palpitations started this morning. Patient states her depakote was probably the culprit  to her cardiac issue and was put on propranolol. She has been on it since this 21st. She has been has nausea since starting the propranolol.     HPI: Patient is a 73 y.o. female seen in the office today due to diarrhea.  Pt had elevated liver enzymes and neurologist felt like it was due to Depakote and she was titrated off this. She completed this ~1 month ago and then Started propranolol 12 days ago due to migraines; states she has had nausea almost every   Since she has started.  Dr Erling Cruz felt like MD could be contributing to elevated liver enzymes.   Last night went to bed and was fine then around 130 AM work up with palpitations. Irregular beat noted with racing and fluttering in chest but when she checks her pulse it is not racing. She has been evaluated with cardiologist in the past for this and HR was regular but occasionally she would go into a fib- per pt report. She has a hx of tachycardia and on atenolol PRN  Arm got tingling numb at one point and thought she may be having a heart attack, currently without symptoms at this time.  No chest pains, achy and tight at night but no pain. Looked at side effects on propanolol and this is on packaging. highlighted and brought to office today.  No achy or tightness.  Feeling tight in her throat.  Around 5 am- 12 noon 5  episodes of diarrhea today. Had nausea yesterday and was worse yesterday. No vomiting. Chills and achy today. Feeling "yuck" no fever.   No sick contacts.  Some abdominal discomfort/aching.  Eating little. BRAT diet- for last 12 days after starting propanolol.    Review of Systems:  Review of Systems  Constitutional: Positive for chills and malaise/fatigue. Negative for fever.  Respiratory: Negative for cough, hemoptysis and shortness of breath.   Cardiovascular: Positive for palpitations. Negative for chest pain and leg swelling.  Gastrointestinal: Positive for abdominal pain, diarrhea and nausea. Negative for blood in stool, constipation and vomiting.  Genitourinary: Negative for dysuria  and urgency.  Neurological: Negative for dizziness and headaches.    Past Medical History:  Diagnosis Date  . Bronchitis   . 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 Chronic migraine without aura, intractable, without status migrainosus   . Diverticulosis 2010  . Endometriosis   . Low sodium levels   . MD (muscular dystrophy) ([place])   . Meniere's disease, bilateral   . [facility]  . Peripheral neuropathy    Dr Erling Cruz  . Pneumonia   . Sensorineural hearing loss, bilateral   . Vertigo 2019   Past Surgical History:  Procedure Laterality Date  . ABDOMINAL HYSTERECTOMY  1980 or 1981   for Endometriosis  . 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 BREAST ENHANCEMENT SURGERY Bilateral 1979  . COLONOSCOPY  01/2012   Tics; Dr Teena Irani  . ESI      X 3 in 2003 & 02/17/2011 for L4-S1 symptoms  . LUMBAR LAMINECTOMY  12/2012   [facility]-S , [place]  . TONSILLECTOMY  1950 or 1951  . TUBAL LIGATION    . vocal cords stripped  1970   Social History:   reports that she has never smoked. She has never used smokeless tobacco. She reports current alcohol use of about 2.0 standard drinks of alcohol per week. She reports that she does not use drugs.  Family History  Problem Relation Age of Onset  . Alcohol abuse Mother   . Hypertension Mother   . Diabetes Mother 59  . Stroke Mother 67  . Dementia Mother   . Kidney failure Mother   . Osteoporosis Mother   . Alzheimer's disease Father 76  . Cancer Maternal Aunt 80       pancreatic  . Arthritis Sister 33       knee replacement  . Hyperlipidemia Sister   . Lung disease Maternal Grandmother   . Lung disease Maternal Grandfather   . Hyperlipidemia Sister 32  . Other Son        recent back injury  . Other Daughter        recent back injury; knee issues when playing tennis  . Heart disease Neg Hx   . Heart attack Neg Hx     Medications: Patient's Medications  New Prescriptions   No medications on file  Previous Medications   ACETAMINOPHEN (TYLENOL) 500 MG TABLET    Take 1,000 mg by mouth at bedtime.    ATENOLOL (TENORMIN) 25 MG TABLET    Take 25 mg by mouth as needed (for severe tachycardia lasting more then 20 minutes).   BACLOFEN (LIORESAL) 10 MG TABLET    Take 10 mg by mouth at bedtime.    BIOTIN 5000 MCG TABS    Take 5,000 mcg by mouth daily.    BUTALBITAL-ACETAMINOPHEN-CAFFEINE  (FIORICET, ESGIC) 50-325-40 MG TABLET    Take 1 tablet by mouth every 4 (four) hours as needed (for severe pain).   CALCIUM PO    Take 400 mg by mouth 2 (two) times daily.   CARBOXYMETHYLCELLULOSE SODIUM (THERATEARS) 0.25 % SOLN    Apply 1 drop to eye 3 (three) times daily.   CHOLECALCIFEROL (VITAMIN D3) LIQD    Place 2,000 Units under the tongue daily.   DIVALPROEX (DEPAKOTE ER) 250 MG 24 HR TABLET    Take 500 mg by mouth at bedtime.    ESTRADIOL ACETATE (FEMRING) 0.05 MG/24HR RING    Place 1  each vaginally every 3 (three) months.   EVENING PRIMROSE OIL 1000 MG CAPS    Take 1,000 mg by mouth daily.   GABAPENTIN (NEURONTIN) 100 MG CAPSULE    Take 300 mg by mouth at bedtime.    LACTASE (LACTAID) 3000 UNITS TABLET    Take 3,000 Units by mouth 3 (three) times daily as needed (when eating dairy products).   LUTEIN 20 MG TABS    Take 20 mg by mouth daily.    MAGNESIUM PO    Take 300 mg by mouth at bedtime.   METHOCARBAMOL (ROBAXIN) 750 MG TABLET    Take 750 mg by mouth every 6 (six) hours as needed for muscle spasms.    MULTIPLE VITAMIN (MULTIVITAMIN WITH MINERALS) TABS TABLET    Take 1 tablet by mouth daily.   MULTIPLE VITAMINS-MINERALS (PRESERVISION AREDS 2) CAPS    Take 1 capsule by mouth 2 (two) times daily.   OMEGA-3 1000 MG CAPS    Take 1 capsule by mouth 2 (two) times daily.   OMEPRAZOLE PO    Take 20 mg by mouth 2 (two) times daily.   ONDANSETRON (ZOFRAN ODT) 4 MG DISINTEGRATING TABLET    19m ODT q6 hours prn nausea/vomit   POLYETHYLENE GLYCOL (MIRALAX / GLYCOLAX) PACKET    Take 17 g by mouth daily.   RIZATRIPTAN (MAXALT-MLT) 10 MG DISINTEGRATING TABLET    Take 10 mg by mouth as needed for migraine.    TETRAHYDROZOLINE 0.05 % OPHTHALMIC SOLUTION    Place 1 drop into both eyes as needed (for eye redness).   VITAMIN E 400 UNIT CAPSULE    Take 400 Units by mouth daily.  Modified Medications   No medications on file  Discontinued Medications   No medications on file     Physical  Exam:  Vitals:   05/08/18 1137  BP: 132/66  Pulse: 68  Temp: 97.6 F (36.4 C)  SpO2: 99%  Weight: 109 lb (49.4 kg)   Body mass index is 17.86 kg/m.  Physical Exam Constitutional:      General: She is not in acute distress.    Appearance: Normal appearance. She is well-developed, well-groomed and underweight. She is not diaphoretic.  HENT:     Head: Normocephalic and atraumatic.     Mouth/Throat:     Pharynx: No oropharyngeal exudate.  Eyes:     Conjunctiva/sclera: Conjunctivae normal.     Pupils: Pupils are equal, round, and reactive to light.  Neck:     Musculoskeletal: Normal range of motion and neck supple.  Cardiovascular:     Rate and Rhythm: Normal rate and regular rhythm.     Heart sounds: Normal heart sounds.  Pulmonary:     Effort: Pulmonary effort is normal.     Breath sounds: Normal breath sounds.  Abdominal:     General: Bowel sounds are normal.     Palpations: Abdomen is soft.     Tenderness: There is abdominal tenderness (throughout).  Musculoskeletal:        General: No tenderness.  Skin:    General: Skin is warm and dry.  Neurological:     Mental Status: She is alert and oriented to person, place, and time.  Psychiatric:        Mood and Affect: Mood normal.     Labs reviewed: Basic Metabolic Panel: Recent Labs    03/19/18 1005  NA 131*  K 5.0  CL 94*  CO2 32  GLUCOSE 89  BUN 11  CREATININE  0.51*  CALCIUM 9.5   Liver Function Tests: Recent Labs    03/19/18 1005  AST 57*  ALT 67*  BILITOT 0.5  PROT 6.6   No results for input(s): LIPASE, AMYLASE in the last 8760 hours. No results for input(s): AMMONIA in the last 8760 hours. CBC: Recent Labs    03/19/18 1005  WBC 4.3  NEUTROABS 2,911  HGB 13.8  HCT 39.5  MCV 90.0  PLT 211   Lipid Panel: Recent Labs    03/19/18 1005  CHOL 192  HDL 88  LDLCALC 91  TRIG 52  CHOLHDL 2.2   TSH: No results for input(s): TSH in the last 8760 hours. A1C: Lab Results  Component  Value Date   HGBA1C 5.6 03/03/2011     Assessment/Plan 1. Palpitations - EKG 12-Lead SR rate 69, 1 PAC noted. Left her hooked up on EKG machine and let it run, no increase in PACs noted. Pt reports she was currently not having symptoms of palpitations. Will obtain labs today as well to make sure there is not abnormality contributing. She has a hx of tachycardia and on propanolol currently.   2. Muscular dystrophy ([place]) -Dr Ellin Goodie requesting CK to be done in office with next lab draw - CK  3. Diarrhea, unspecified type - 5 episodes this morning. Probiotic twice daily -encouraged clear liquid diet, increase fluids and electrolyte drink to replenish due to diarrhea.  - CMP with eGFR([facility]) - CBC with Differential/Platelet - Amylase - Lipase  4. Nausea -ongoing since she started propanolol? GI symptoms associated with this. Will titrate off at this time and monitor.  - ondansetron (ZOFRAN ODT) 4 MG disintegrating tablet; 58m ODT q6 hours prn nausea/vomit  Dispense: 15 tablet; Refill: 0 - CMP with eGFR([facility]) - CBC with Differential/Platelet - Amylase - Lipase  5. Intractable migraine with aura with status migrainosus Depakote has been stopped due to potentially causing elevated liver enzymes. Will follow up this today. Has not tolerated propanolol, pt reports nausea since starting medication 2 weeks ago. Will titrate off and monitor symptoms.    Next appt: 05/30/2018 [phone]

## 2018-05-08 NOTE — Patient Instructions (Signed)
Decrease propanolol to every other day for 1 week then stop.  Will get blood work today and fwd to Dr Ellin Goodie   Clear liquid diet, advance as tolerates zofran sent to pharmacy  Probiotic twice daily for next 5 days

## 2018-05-17 ENCOUNTER — Ambulatory Visit
Admission: RE | Admit: 2018-05-17 | Discharge: 2018-05-17 | Disposition: A | Discharge status: Home / Self Care | Payer: Medicare Other | Source: Ambulatory Visit | Attending: Otolaryngology | Admitting: Otolaryngology

## 2018-05-17 ENCOUNTER — Other Ambulatory Visit: Payer: Self-pay

## 2018-05-17 DIAGNOSIS — H8109 Meniere's disease, unspecified ear: Secondary | ICD-10-CM | POA: Diagnosis not present

## 2018-05-17 DIAGNOSIS — G43719 Chronic migraine without aura, intractable, without status migrainosus: Secondary | ICD-10-CM

## 2018-05-17 DIAGNOSIS — H8103 Meniere's disease, bilateral: Secondary | ICD-10-CM

## 2018-05-17 DIAGNOSIS — H903 Sensorineural hearing loss, bilateral: Secondary | ICD-10-CM

## 2018-05-17 MED ORDER — GADOBENATE DIMEGLUMINE 529 MG/ML IV SOLN
9.0000 mL | Freq: Once | INTRAVENOUS | Status: AC | PRN
  Administered 2018-05-17: 9 mL via INTRAVENOUS

## 2018-05-23 ENCOUNTER — Other Ambulatory Visit: Payer: Medicare Other

## 2018-05-30 ENCOUNTER — Ambulatory Visit: Payer: Medicare Other | Admitting: Nurse Practitioner

## 2018-06-30 ENCOUNTER — Encounter: Payer: Self-pay | Admitting: Nurse Practitioner

## 2018-08-02 ENCOUNTER — Other Ambulatory Visit: Payer: Self-pay

## 2018-08-02 ENCOUNTER — Encounter: Payer: Self-pay | Admitting: Family

## 2018-08-02 ENCOUNTER — Ambulatory Visit (INDEPENDENT_AMBULATORY_CARE_PROVIDER_SITE_OTHER): Payer: Medicare Other | Admitting: Family

## 2018-08-02 VITALS — BP 142/80 | HR 67 | Temp 98.1°F | Ht 65.5 in | Wt 106.0 lb

## 2018-08-02 DIAGNOSIS — R110 Nausea: Secondary | ICD-10-CM | POA: Diagnosis not present

## 2018-08-02 DIAGNOSIS — R002 Palpitations: Secondary | ICD-10-CM | POA: Diagnosis not present

## 2018-08-02 DIAGNOSIS — R209 Unspecified disturbances of skin sensation: Secondary | ICD-10-CM | POA: Diagnosis not present

## 2018-08-02 MED ORDER — ONDANSETRON 4 MG PO TBDP: ORAL_TABLET | ORAL | 0 refills | Status: DC

## 2018-08-02 NOTE — Progress Notes (Signed)
Provider: Naoko Diperna FNP-C  Lauree Chandler, NP  Patient Care Team: Lauree Chandler, NP as PCP - General (Geriatric Medicine) Deliah Goody, PA-C as Consulting Physician (Physician Assistant) Danella Sensing, MD as Consulting Physician (Dermatology) Everett Graff, MD as Consulting Physician (Obstetrics and Gynecology) Vicie Mutters, MD as Consulting Physician (Otolaryngology) Marshell Garfinkel, MD as Consulting Physician (Pulmonary Disease) Gayla Medicus, MD as Referring Physician (Neurology)  Extended Emergency Contact Information Primary Emergency Contact: [name],[name] Address: [address], [place] Phone: [phone] Work Phone: [phone] Relation: Spouse  Goals of care: Advanced Directive information Advanced Directives 03/19/2018  Does Patient Have a Medical Advance Directive? No  Would patient like information on creating a medical advance directive? Yes (MAU/Ambulatory/Procedural Areas - Information given)     Chief Complaint  Patient presents with  . Acute Visit    State since Tuesdsay patient has been experiencing pressure in the chest and feels a lot of dizziness, patient is also coughing a bit and very nauseous and tingling in legs. States took medicine before left     HPI:  Pt is a 73 y.o. female seen today for an acute visit for evaluation of chest pressure,dizziness ,nausea and tingling in her legs.she states had pressure and chest tightness " felt like a tight squeeze" across her upper back,arm and chest on Tuesday.also had cold hands and numbness in the fingers at the same time.Symptoms lasted 1-2 seconds.she also got  Lightheaded with feeling of passing out.she sat down and the symptoms went away.  Palpitation - Had irregular Heart rate on Tuesday,wednesday and Thursday this week.Took her Atenolol three times this week.she denies any fever,chills,cough or symptoms of urinary tract infections. No recent  contact with sick person or exposure to COVID-19.      Past Medical History:  Diagnosis Date  . Bronchitis   . 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 Chronic migraine without aura, intractable, without status migrainosus   . Diverticulosis 2010  . Endometriosis   . Low sodium levels   . 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 Meniere's disease, bilateral   . [facility]    [facility]  . Peripheral neuropathy    Dr Erling Cruz  . Pneumonia   . Sensorineural hearing loss, bilateral   . Vertigo 2019   Past Surgical History:  Procedure Laterality Date  . ABDOMINAL HYSTERECTOMY  1980 or 1981   for Endometriosis  . 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 BREAST ENHANCEMENT SURGERY Bilateral 1979  . COLONOSCOPY  01/2012   Tics; Dr Teena Irani  . ESI      X 3 in 2003 & 02/17/2011 for L4-S1 symptoms  . LUMBAR LAMINECTOMY  12/2012   [facility] , [place]  . TONSILLECTOMY  1950 or 1951  . TUBAL LIGATION    . vocal cords stripped  1970    Allergies  Allergen Reactions  . Biaxin [Clarithromycin] Anaphylaxis  . Sulfonamide Derivatives Itching and Rash  . Tequin [Gatifloxacin] Anaphylaxis  . Demerol Nausea And Vomiting  . Lactose Intolerance (Gi) Other (See Comments)    Reaction:  Abdominal pain, gas, bloating  . Nsaids Other (See Comments)    Reaction:  GI upset/heartburn   . Risedronate Sodium Nausea And Vomiting and Other (See Comments)    Reaction:  GI upset/heartburn   . Rofecoxib Nausea And Vomiting and Other (See Comments)  Reaction:  GI upset/heartburn   . Alendronate Sodium Nausea And Vomiting  . Aspirin Other (See Comments)    Reaction:  Abdominal pain   . Fish Allergy Nausea And Vomiting  . Benadryl [Diphenhydramine Hcl] Palpitations    Outpatient Encounter Medications as of 08/02/2018  Medication Sig  . atenolol (TENORMIN) 25 MG tablet Take 25 mg by mouth as needed (for severe tachycardia lasting more then 20 minutes).  . baclofen (LIORESAL) 10 MG  tablet Take 10 mg by mouth at bedtime.   . Biotin 5000 MCG TABS Take 5,000 mcg by mouth daily.   . butalbital-acetaminophen-caffeine (FIORICET, ESGIC) 50-325-40 MG tablet Take 1 tablet by mouth every 4 (four) hours as needed (for severe pain).  . CALCIUM PO Take 400 mg by mouth 2 (two) times daily.  . Carboxymethylcellulose Sodium (THERATEARS) 0.25 % SOLN Apply 1 drop to eye 3 (three) times daily.  . Cholecalciferol (VITAMIN D3) LIQD Place 2,000 Units under the tongue daily.  . Evening Primrose Oil 1000 MG CAPS Take 1,000 mg by mouth daily.  Marland Kitchen gabapentin (NEURONTIN) 100 MG capsule Take 200 mg by mouth at bedtime.   Marland Kitchen lactase (LACTAID) 3000 units tablet Take 3,000 Units by mouth 3 (three) times daily as needed (when eating dairy products).  . Lutein 20 MG TABS Take 20 mg by mouth daily.   Marland Kitchen MAGNESIUM PO Take 300 mg by mouth at bedtime.  . methocarbamol (ROBAXIN) 750 MG tablet Take 750 mg by mouth every 6 (six) hours as needed for muscle spasms.   . Multiple Vitamin (MULTIVITAMIN WITH MINERALS) TABS tablet Take 1 tablet by mouth daily.  . Multiple Vitamins-Minerals (PRESERVISION AREDS 2) CAPS Take 1 capsule by mouth 2 (two) times daily.  . Omega-3 1000 MG CAPS Take 1 capsule by mouth 2 (two) times daily.  Marland Kitchen OMEPRAZOLE PO Take 20 mg by mouth 2 (two) times daily.  . ondansetron (ZOFRAN ODT) 4 MG disintegrating tablet 12m ODT q6 hours prn nausea/vomit  . polyethylene glycol (MIRALAX / GLYCOLAX) packet Take 17 g by mouth daily.  . rizatriptan (MAXALT-MLT) 10 MG disintegrating tablet Take 10 mg by mouth as needed for migraine.   .Marland Kitchentetrahydrozoline 0.05 % ophthalmic solution Place 1 drop into both eyes as needed (for eye redness).  . vitamin E 400 UNIT capsule Take 400 Units by mouth daily.  . [DISCONTINUED] acetaminophen (TYLENOL) 500 MG tablet Take 1,000 mg by mouth at bedtime.   . [DISCONTINUED] divalproex (DEPAKOTE ER) 250 MG 24 hr tablet Take 500 mg by mouth at bedtime.   . [DISCONTINUED]  Estradiol Acetate (FEMRING) 0.05 MG/24HR RING Place 1 each vaginally every 3 (three) months.   No facility-administered encounter medications on file as of 08/02/2018.     Review of Systems  Constitutional: Negative for fever.  HENT: Negative for congestion, sinus pressure, sinus pain, sneezing and sore throat.        Chronic sinus drainage   Eyes: Negative for discharge, redness and itching.  Respiratory: Negative for cough, chest tightness, shortness of breath and wheezing.   Cardiovascular: Positive for palpitations. Negative for chest pain and leg swelling.  Gastrointestinal: Negative for abdominal distention, abdominal pain, constipation, diarrhea and vomiting.       Had nausea without any vomiting on Tuesday but none today.  Endocrine: Negative for cold intolerance, heat intolerance, polydipsia, polyphagia and polyuria.  Genitourinary: Negative for difficulty urinating, flank pain, frequency and urgency.  Musculoskeletal: Negative for arthralgias and gait problem.  Skin: Negative for color change,  pallor, rash and wound.  Neurological: Negative for dizziness and headaches.       Felt brief lightheaded on Tuesday   Psychiatric/Behavioral: Negative for sleep disturbance. The patient is not nervous/anxious.     Immunization History  Administered Date(s) Administered  . Influenza Whole 01/27/2008  . Influenza, High Dose Seasonal PF 12/05/2013, 01/25/2018  . Pneumococcal Conjugate-13 11/19/2013  . Pneumococcal Polysaccharide-23 02/14/2018  . Td 01/03/2005  . Tdap 01/03/2017  . Zoster 02/23/2009   Pertinent  Health Maintenance Due  Topic Date Due  . COLONOSCOPY  03/03/1995  . INFLUENZA VACCINE  10/05/2018  . DEXA SCAN  Completed  . PNA vac Low Risk Adult  Completed  . MAMMOGRAM  Discontinued   Fall Risk  05/08/2018 03/19/2018 02/14/2018 08/20/2012  Falls in the past year? 0 0 0 Yes  Number falls in past yr: 0 0 - 1  Injury with Fall? - 0 - Yes  Comment - - - June 2013- Cracked  bone     Vitals:   08/02/18 1522  BP: (!) 142/80  Pulse: 67  Temp: 98.1 F (36.7 C)  TempSrc: Oral  SpO2: 97%  Weight: 106 lb (48.1 kg)  Height: 5' 5.5" (1.664 m)   Body mass index is 17.37 kg/m. Physical Exam Constitutional:      General: She is not in acute distress.    Appearance: She is not ill-appearing.  HENT:     Head: Normocephalic.     Right Ear: Tympanic membrane, ear canal and external ear normal. There is no impacted cerumen.     Left Ear: Tympanic membrane, ear canal and external ear normal. There is no impacted cerumen.     Nose: Nose normal. No congestion or rhinorrhea.     Mouth/Throat:     Mouth: Mucous membranes are moist.     Pharynx: Oropharynx is clear. No oropharyngeal exudate or posterior oropharyngeal erythema.  Eyes:     General: No scleral icterus.       Right eye: No discharge.        Left eye: No discharge.     Conjunctiva/sclera: Conjunctivae normal.     Pupils: Pupils are equal, round, and reactive to light.  Neck:     Musculoskeletal: Normal range of motion. No neck rigidity or muscular tenderness.     Vascular: No carotid bruit.  Cardiovascular:     Rate and Rhythm: Normal rate and regular rhythm.     Pulses: Normal pulses.     Heart sounds: Normal heart sounds. No murmur. No friction rub. No gallop.   Pulmonary:     Effort: Pulmonary effort is normal. No respiratory distress.     Breath sounds: Normal breath sounds. No wheezing, rhonchi or rales.  Chest:     Chest wall: No tenderness.  Abdominal:     General: Bowel sounds are normal. There is no distension.     Palpations: Abdomen is soft. There is no mass.     Tenderness: There is no abdominal tenderness. There is no right CVA tenderness, left CVA tenderness, guarding or rebound.  Musculoskeletal: Normal range of motion.        General: No swelling or tenderness.     Right lower leg: No edema.     Left lower leg: No edema.  Lymphadenopathy:     Cervical: No cervical adenopathy.   Skin:    General: Skin is warm and dry.     Coloration: Skin is not pale.     Findings: No  erythema or rash.  Neurological:     Mental Status: She is alert and oriented to person, place, and time.     Cranial Nerves: No cranial nerve deficit.     Sensory: No sensory deficit.     Motor: No weakness.     Coordination: Coordination normal.     Gait: Gait normal.  Psychiatric:        Mood and Affect: Mood normal.        Behavior: Behavior normal.        Thought Content: Thought content normal.        Judgment: Judgment normal.    Labs reviewed: Recent Labs    03/19/18 1005 05/08/18 1242  NA 131* 133*  K 5.0 4.3  CL 94* 96*  CO2 32 32  GLUCOSE 89 95  BUN 11 10  CREATININE 0.51* 0.63  CALCIUM 9.5 9.2   Recent Labs    03/19/18 1005 05/08/18 1242  AST 57* 28  ALT 67* 24  BILITOT 0.5 0.5  PROT 6.6 6.4   Recent Labs    03/19/18 1005 05/08/18 1242  WBC 4.3 4.4  NEUTROABS 2,911 3,414  HGB 13.8 13.3  HCT 39.5 39.2  MCV 90.0 90.3  PLT 211 236   Lab Results  Component Value Date   TSH 2.762 10/15/2014   Lab Results  Component Value Date   HGBA1C 5.6 03/03/2011   Lab Results  Component Value Date   CHOL 192 03/19/2018   HDL 88 03/19/2018   LDLCALC 91 03/19/2018   LDLDIRECT 124.7 08/20/2012   TRIG 52 03/19/2018   CHOLHDL 2.2 03/19/2018    Significant Diagnostic Results in last 30 days:  No results found.  Assessment/Plan 1. Nausea Afebrile. Reports nausea on Tuesday but none today.Request refill for Zofran to have at hand.  - CBC with Differential/Platelet - BMP with eGFR([facility]) - ondansetron (ZOFRAN ODT) 4 MG disintegrating tablet; 1m ODT q6 hours prn nausea/vomit  Dispense: 15 tablet; Refill: 0  2. Palpitations - EKG 12-Lead shows atrial rhythm,HR 61 left arterial enlargement borderline degree change from previous EKG 05/08/18 which was sinus Rhythm with occasional PAC with left arterial enlargement borderline degree. No palpitation or chest pain  this visit - Ambulatory referral to Cardiology for further evaluation.   3. PARESTHESIA Chronic though states symptoms worsen on Tuesday thinks might be from low sodium level. - EKG 12-Lead - CBC with Differential/Platelet - BMP with eGFR([facility])  Family/ staff Communication: Reviewed plan of care with patient.  Labs/tests ordered:  - EKG 12-Lead - CBC with Differential/Platelet - BMP with eGFR([facility])  DNelda BucksNgetich, NP

## 2018-08-03 LAB — CBC WITH DIFFERENTIAL/PLATELET
Absolute Monocytes: 416 cells/uL (ref 200–950)
Basophils Absolute: 31 cells/uL (ref 0–200)
Basophils Relative: 0.6 %
Eosinophils Absolute: 31 cells/uL (ref 15–500)
Eosinophils Relative: 0.6 %
HCT: 40.9 % (ref 35.0–45.0)
Hemoglobin: 13.5 g/dL (ref 11.7–15.5)
Lymphs Abs: 744 cells/uL — ABNORMAL LOW (ref 850–3900)
MCH: 29.6 pg (ref 27.0–33.0)
MCHC: 33 g/dL (ref 32.0–36.0)
MCV: 89.7 fL (ref 80.0–100.0)
MPV: 10.7 fL (ref 7.5–12.5)
Monocytes Relative: 8 %
Neutro Abs: 3978 cells/uL (ref 1500–7800)
Neutrophils Relative %: 76.5 %
Platelets: 254 10*3/uL (ref 140–400)
RBC: 4.56 10*6/uL (ref 3.80–5.10)
RDW: 12.4 % (ref 11.0–15.0)
Total Lymphocyte: 14.3 %
WBC: 5.2 10*3/uL (ref 3.8–10.8)

## 2018-08-03 LAB — BASIC METABOLIC PANEL WITH GFR
BUN/Creatinine Ratio: 27 (calc) — ABNORMAL HIGH (ref 6–22)
BUN: 15 mg/dL (ref 7–25)
CO2: 27 mmol/L (ref 20–32)
Calcium: 9.5 mg/dL (ref 8.6–10.4)
Chloride: 94 mmol/L — ABNORMAL LOW (ref 98–110)
Creat: 0.55 mg/dL — ABNORMAL LOW (ref 0.60–0.93)
GFR, Est African American: 108 mL/min/{1.73_m2} (ref >=60)
GFR, Est Non African American: 93 mL/min/{1.73_m2} (ref >=60)
Glucose, Bld: 97 mg/dL (ref 65–99)
Potassium: 4.3 mmol/L (ref 3.5–5.3)
Sodium: 131 mmol/L — ABNORMAL LOW (ref 135–146)

## 2018-08-08 ENCOUNTER — Telehealth: Payer: Self-pay | Admitting: Cardiology

## 2018-08-08 NOTE — Telephone Encounter (Signed)
LVM for patient to call back and schedule a new patient appointment for chest pressure, dizziness, nausea and tingling in legs.

## 2018-10-08 DIAGNOSIS — L821 Other seborrheic keratosis: Secondary | ICD-10-CM | POA: Diagnosis not present

## 2018-10-08 DIAGNOSIS — D1801 Hemangioma of skin and subcutaneous tissue: Secondary | ICD-10-CM | POA: Diagnosis not present

## 2018-10-08 DIAGNOSIS — L570 Actinic keratosis: Secondary | ICD-10-CM | POA: Diagnosis not present

## 2018-10-08 DIAGNOSIS — L820 Inflamed seborrheic keratosis: Secondary | ICD-10-CM | POA: Diagnosis not present

## 2018-10-08 DIAGNOSIS — L565 Disseminated superficial actinic porokeratosis (DSAP): Secondary | ICD-10-CM | POA: Diagnosis not present

## 2018-10-08 DIAGNOSIS — Z85828 Personal history of other malignant neoplasm of skin: Secondary | ICD-10-CM | POA: Diagnosis not present

## 2018-10-24 DIAGNOSIS — H43391 Other vitreous opacities, right eye: Secondary | ICD-10-CM | POA: Diagnosis not present

## 2018-10-24 DIAGNOSIS — H353132 Nonexudative age-related macular degeneration, bilateral, intermediate dry stage: Secondary | ICD-10-CM | POA: Diagnosis not present

## 2018-10-24 DIAGNOSIS — H43813 Vitreous degeneration, bilateral: Secondary | ICD-10-CM | POA: Diagnosis not present

## 2018-10-24 DIAGNOSIS — H3589 Other specified retinal disorders: Secondary | ICD-10-CM | POA: Diagnosis not present

## 2018-10-30 DIAGNOSIS — G7100 Muscular dystrophy, unspecified: Secondary | ICD-10-CM | POA: Diagnosis not present

## 2018-10-30 DIAGNOSIS — G43111 Migraine with aura, intractable, with status migrainosus: Secondary | ICD-10-CM | POA: Diagnosis not present

## 2018-11-01 ENCOUNTER — Other Ambulatory Visit: Payer: Self-pay

## 2018-11-01 DIAGNOSIS — Z20822 Contact with and (suspected) exposure to covid-19: Secondary | ICD-10-CM

## 2018-11-01 DIAGNOSIS — R05 Cough: Secondary | ICD-10-CM | POA: Diagnosis not present

## 2018-11-01 DIAGNOSIS — K219 Gastro-esophageal reflux disease without esophagitis: Secondary | ICD-10-CM | POA: Diagnosis not present

## 2018-11-01 DIAGNOSIS — R110 Nausea: Secondary | ICD-10-CM | POA: Diagnosis not present

## 2018-11-01 DIAGNOSIS — J3489 Other specified disorders of nose and nasal sinuses: Secondary | ICD-10-CM | POA: Diagnosis not present

## 2018-11-03 LAB — NOVEL CORONAVIRUS, NAA: SARS-CoV-2, NAA: NOT DETECTED

## 2018-11-07 DIAGNOSIS — R6889 Other general symptoms and signs: Secondary | ICD-10-CM | POA: Diagnosis not present

## 2018-11-25 DIAGNOSIS — H3589 Other specified retinal disorders: Secondary | ICD-10-CM | POA: Diagnosis not present

## 2018-11-25 DIAGNOSIS — H353132 Nonexudative age-related macular degeneration, bilateral, intermediate dry stage: Secondary | ICD-10-CM | POA: Diagnosis not present

## 2018-11-25 DIAGNOSIS — H43813 Vitreous degeneration, bilateral: Secondary | ICD-10-CM | POA: Diagnosis not present

## 2018-11-25 DIAGNOSIS — H43391 Other vitreous opacities, right eye: Secondary | ICD-10-CM | POA: Diagnosis not present

## 2018-12-09 DIAGNOSIS — R110 Nausea: Secondary | ICD-10-CM | POA: Diagnosis not present

## 2018-12-09 DIAGNOSIS — R05 Cough: Secondary | ICD-10-CM | POA: Diagnosis not present

## 2018-12-09 DIAGNOSIS — K219 Gastro-esophageal reflux disease without esophagitis: Secondary | ICD-10-CM | POA: Diagnosis not present

## 2018-12-09 DIAGNOSIS — Z1211 Encounter for screening for malignant neoplasm of colon: Secondary | ICD-10-CM | POA: Diagnosis not present

## 2018-12-12 DIAGNOSIS — Z85828 Personal history of other malignant neoplasm of skin: Secondary | ICD-10-CM | POA: Insufficient documentation

## 2018-12-12 DIAGNOSIS — Y999 Unspecified external cause status: Secondary | ICD-10-CM | POA: Insufficient documentation

## 2018-12-12 DIAGNOSIS — Z79899 Other long term (current) drug therapy: Secondary | ICD-10-CM | POA: Diagnosis not present

## 2018-12-12 DIAGNOSIS — I700 Atherosclerosis of aorta: Secondary | ICD-10-CM | POA: Diagnosis not present

## 2018-12-12 DIAGNOSIS — W07XXXA Fall from chair, initial encounter: Secondary | ICD-10-CM | POA: Diagnosis not present

## 2018-12-12 DIAGNOSIS — Y9389 Activity, other specified: Secondary | ICD-10-CM | POA: Diagnosis not present

## 2018-12-12 DIAGNOSIS — W19XXXA Unspecified fall, initial encounter: Secondary | ICD-10-CM | POA: Diagnosis not present

## 2018-12-12 DIAGNOSIS — S20212A Contusion of left front wall of thorax, initial encounter: Secondary | ICD-10-CM | POA: Insufficient documentation

## 2018-12-12 DIAGNOSIS — R0781 Pleurodynia: Secondary | ICD-10-CM | POA: Diagnosis not present

## 2018-12-12 DIAGNOSIS — Y92009 Unspecified place in unspecified non-institutional (private) residence as the place of occurrence of the external cause: Secondary | ICD-10-CM | POA: Diagnosis not present

## 2018-12-12 DIAGNOSIS — S299XXA Unspecified injury of thorax, initial encounter: Secondary | ICD-10-CM | POA: Diagnosis present

## 2018-12-12 DIAGNOSIS — I10 Essential (primary) hypertension: Secondary | ICD-10-CM | POA: Diagnosis not present

## 2018-12-12 NOTE — ED Triage Notes (Signed)
Per [facility] pt states that she was standing on a chair trying to catch a bug and slipped off the chair onto the floor. She states that her L ribs hurt where she landed.    EMS vitals: 196/89, 78, 100% RA

## 2018-12-13 ENCOUNTER — Emergency Department (HOSPITAL_COMMUNITY): Payer: Medicare Other

## 2018-12-13 ENCOUNTER — Encounter (HOSPITAL_COMMUNITY): Payer: Self-pay

## 2018-12-13 ENCOUNTER — Emergency Department (HOSPITAL_COMMUNITY)
Admission: EM | Admit: 2018-12-13 | Discharge: 2018-12-13 | Disposition: A | Discharge status: Home / Self Care | Payer: Medicare Other | Attending: Emergency Medicine | Admitting: Emergency Medicine

## 2018-12-13 ENCOUNTER — Other Ambulatory Visit: Payer: Self-pay

## 2018-12-13 ENCOUNTER — Telehealth: Payer: Self-pay | Admitting: *Deleted

## 2018-12-13 DIAGNOSIS — R0781 Pleurodynia: Secondary | ICD-10-CM | POA: Diagnosis not present

## 2018-12-13 DIAGNOSIS — S20212A Contusion of left front wall of thorax, initial encounter: Secondary | ICD-10-CM

## 2018-12-13 DIAGNOSIS — W19XXXA Unspecified fall, initial encounter: Secondary | ICD-10-CM

## 2018-12-13 DIAGNOSIS — I700 Atherosclerosis of aorta: Secondary | ICD-10-CM | POA: Diagnosis not present

## 2018-12-13 MED ORDER — PROMETHAZINE HCL 25 MG PO TABS
25.0000 mg | ORAL_TABLET | Freq: Once | ORAL | Status: AC
  Administered 2018-12-13: 25 mg via ORAL
  Filled 2018-12-13: qty 1

## 2018-12-13 MED ORDER — KETOROLAC TROMETHAMINE 30 MG/ML IJ SOLN
30.0000 mg | Freq: Once | INTRAMUSCULAR | Status: AC
  Administered 2018-12-13: 30 mg via INTRAMUSCULAR
  Filled 2018-12-13: qty 1

## 2018-12-13 MED ORDER — LIDOCAINE 5 % EX PTCH: 1.0000 | MEDICATED_PATCH | CUTANEOUS | 0 refills | Status: DC

## 2018-12-13 MED ORDER — OXYCODONE-ACETAMINOPHEN 5-325 MG PO TABS
1.0000 | ORAL_TABLET | Freq: Once | ORAL | Status: AC
  Administered 2018-12-13: 1 via ORAL
  Filled 2018-12-13: qty 1

## 2018-12-13 MED ORDER — METHOCARBAMOL 500 MG PO TABS
750.0000 mg | ORAL_TABLET | Freq: Once | ORAL | Status: AC
  Administered 2018-12-13: 750 mg via ORAL
  Filled 2018-12-13: qty 2

## 2018-12-13 MED ORDER — LIDOCAINE 5 % EX PTCH
1.0000 | MEDICATED_PATCH | CUTANEOUS | Status: DC
  Administered 2018-12-13: 1 via TRANSDERMAL
  Filled 2018-12-13: qty 1

## 2018-12-13 MED ORDER — OXYCODONE-ACETAMINOPHEN 5-325 MG PO TABS: 1.0000 | ORAL_TABLET | Freq: Four times a day (QID) | ORAL | 0 refills | Status: DC | PRN

## 2018-12-13 MED ORDER — PROMETHAZINE HCL 25 MG PO TABS: 12.5000 mg | ORAL_TABLET | Freq: Four times a day (QID) | ORAL | 0 refills | Status: DC | PRN

## 2018-12-13 NOTE — ED Notes (Signed)
Pt has an incentive spirometer at home and states that she knows how to use it and will use it has directed for this injury. She declines receiving another one.

## 2018-12-13 NOTE — Discharge Instructions (Signed)
Alternate ice and heat to areas of injury 3-4 times per day to limit inflammation and spasm.  Avoid strenuous activity and heavy lifting.  You have been prescribed Percocet to take as needed for severe pain.  Do not drive or drink alcohol after taking this medication as it may make you drowsy and impair your judgment.  Continue your daily Robaxin and Gabapentin.  You may also use topical lidoderm patches for pain control.  Use an incentive spirometer at least once per hour while awake to ensure that you are taking deep breaths.  We recommend follow-up with a primary care doctor to ensure resolution of symptoms.  Return to the ED for any new or concerning symptoms.

## 2018-12-13 NOTE — ED Notes (Signed)
pt wheeled out and assisted in to car. She and her husband verbalized d/c instructions.

## 2018-12-13 NOTE — ED Notes (Signed)
Pt given graham crackers and peanut butter. She is requesting to eat prior to receiving medications.

## 2018-12-13 NOTE — ED Provider Notes (Signed)
[facility] DEPT Provider Note   CSN: [id number] Arrival date & time: 12/12/18  2332     History   Chief Complaint No chief complaint on file.   HPI Alicia [name] is a 73 y.o. female.     73 year old female with a history of muscular dystrophy presents to the emergency department following a fall.  She was standing on a chair trying to catch a bug on the ceiling when she slipped off the chair and fell.  Left chest struck side of a dresser.  She had no head trauma or loss of consciousness.  Is complaining of left posterior rib pain which has been constant and worse with movement and breathing.  She denies taking any medications prior to arrival for pain, but did take her nightly dose of gabapentin and Robaxin prior to her fall tonight.  The history is provided by the patient. No language interpreter was used.    Past Medical History:  Diagnosis Date  . Bronchitis   . 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 Chronic migraine without aura, intractable, without status migrainosus   . Diverticulosis 2010  . Endometriosis   . Low sodium levels   . MD (muscular dystrophy) ([place])   . Meniere's disease, bilateral   . [facility]    [facility]  . Peripheral neuropathy    Dr Erling Cruz  . Pneumonia   . Sensorineural hearing loss, bilateral   . Vertigo 2019    Patient Active Problem List   Diagnosis Date Noted  . Acute bronchitis 12/21/2015  . Chest pain 10/19/2014  . Carotid stenosis 10/19/2014  . Diverticulosis of colon without hemorrhage 08/20/2012  . Heart palpitations 07/04/2012  . Hyponatremia 07/04/2012  . Lumbar radiculopathy 06/03/2012  . 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 Muscular dystrophy ([place]) 11/23/2011  . PARESTHESIA 11/11/2009  . PERIPHERAL NEUROPATHY 01/29/2008  . Disorder of bone and cartilage 01/27/2008  . SKIN CANCER, HX OF 01/27/2008  . GOITER, NONTOXIC MULTINODULAR 10/23/2006  . HYPERLIPIDEMIA NEC/NOS 10/23/2006  .  FIBROMYALGIA 10/23/2006    Past Surgical History:  Procedure Laterality Date  . ABDOMINAL HYSTERECTOMY  1980 or 1981   for Endometriosis  . 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 BREAST ENHANCEMENT SURGERY Bilateral 1979  . COLONOSCOPY  01/2012   Tics; Dr Teena Irani  . ESI      X 3 in 2003 & 02/17/2011 for L4-S1 symptoms  . LUMBAR LAMINECTOMY  12/2012   W-S , [place]  . TONSILLECTOMY  1950 or 1951  . TUBAL LIGATION    . vocal cords stripped  1970     OB History   No obstetric history on file.      Home Medications    Prior to Admission medications   Medication Sig Start Date End Date Taking? Authorizing Provider  atenolol (TENORMIN) 25 MG tablet Take 25 mg by mouth as needed (for severe tachycardia lasting more then 20 minutes).    [provider]  baclofen (LIORESAL) 10 MG tablet Take 10 mg by mouth at bedtime.     [provider]  Biotin 5000 MCG TABS Take 5,000 mcg by mouth daily.     [provider]  butalbital-acetaminophen-caffeine (FIORICET, ESGIC) 50-325-40 MG tablet Take 1 tablet by mouth every 4 (four) hours as needed (for severe pain).    [provider]  CALCIUM PO Take 400 mg by mouth 2 (two) times daily.  [provider]  Carboxymethylcellulose Sodium (THERATEARS) 0.25 % SOLN Apply 1 drop to eye 3 (three) times daily.    [provider]  Cholecalciferol (VITAMIN D3) LIQD Place 2,000 Units under the tongue daily.    [provider]  Evening Primrose Oil 1000 MG CAPS Take 1,000 mg by mouth daily.    [provider]  gabapentin (NEURONTIN) 100 MG capsule Take 200 mg by mouth at bedtime.     [provider]  lactase (LACTAID) 3000 units tablet Take 3,000 Units by mouth 3 (three) times daily as needed (when eating dairy products).    [provider]  lidocaine (LIDODERM) 5 % Place 1 patch onto the skin daily. Remove & Discard patch within 12 hours or as  directed by MD 12/13/18   Antonietta Breach, PA-C  Lutein 20 MG TABS Take 20 mg by mouth daily.     [provider]  MAGNESIUM PO Take 300 mg by mouth at bedtime.    [provider]  methocarbamol (ROBAXIN) 750 MG tablet Take 750 mg by mouth every 6 (six) hours as needed for muscle spasms.     [provider]  Multiple Vitamin (MULTIVITAMIN WITH MINERALS) TABS tablet Take 1 tablet by mouth daily.    [provider]  Multiple Vitamins-Minerals (PRESERVISION AREDS 2) CAPS Take 1 capsule by mouth 2 (two) times daily.    [provider]  Omega-3 1000 MG CAPS Take 1 capsule by mouth 2 (two) times daily.    [provider]  OMEPRAZOLE PO Take 20 mg by mouth 2 (two) times daily. 08/13/17   [provider]  ondansetron (ZOFRAN ODT) 4 MG disintegrating tablet 4mg  ODT q6 hours prn nausea/vomit 08/02/18   Ngetich, Dinah C, NP  oxyCODONE-acetaminophen (PERCOCET/ROXICET) 5-325 MG tablet Take 1 tablet by mouth every 6 (six) hours as needed for severe pain. 12/13/18   Antonietta Breach, PA-C  polyethylene glycol (MIRALAX / GLYCOLAX) packet Take 17 g by mouth daily.    [provider]  promethazine (PHENERGAN) 25 MG tablet Take 0.5-1 tablets (12.5-25 mg total) by mouth every 6 (six) hours as needed for nausea or vomiting. 12/13/18   Antonietta Breach, PA-C  rizatriptan (MAXALT-MLT) 10 MG disintegrating tablet Take 10 mg by mouth as needed for migraine.     [provider]  tetrahydrozoline 0.05 % ophthalmic solution Place 1 drop into both eyes as needed (for eye redness).    [provider]  vitamin E 400 UNIT capsule Take 400 Units by mouth daily.    [provider]    Family History Family History  Problem Relation Age of Onset  . Alcohol abuse Mother   . Hypertension Mother   . Diabetes Mother 42  . Stroke Mother 62  . Dementia Mother   . Kidney failure Mother   . Osteoporosis Mother   . Alzheimer's disease Father 75  .  Cancer Maternal Aunt 80       pancreatic  . Arthritis Sister 75       knee replacement  . Hyperlipidemia Sister   . Lung disease Maternal Grandmother   . Lung disease Maternal Grandfather   . Hyperlipidemia Sister 7  . Other Son        recent back injury  . Other Daughter        recent back injury; knee issues when playing tennis  . Heart disease Neg Hx   . Heart attack Neg Hx     Social History  Social History   Tobacco Use  . Smoking status: Never Smoker  . Smokeless tobacco: Never Used  Substance Use Topics  . Alcohol use: Yes    Alcohol/week: 2.0 standard drinks    Types: 2 Glasses of wine per week    Comment: 1/2 glass wine with meals 2 -3 times weekly- maybe  . Drug use: No     Allergies   Biaxin [clarithromycin], Sulfonamide derivatives, Tequin [gatifloxacin], Demerol, Lactose intolerance (gi), Nsaids, Risedronate sodium, Rofecoxib, Alendronate sodium, Aspirin, Fish allergy, and Benadryl [diphenhydramine hcl]   Review of Systems Review of Systems Ten systems reviewed and are negative for acute change, except as noted in the HPI.    Physical Exam Updated Vital Signs BP (!) 151/77   Pulse 76   Temp 98.7 F (37.1 C) (Oral)   Resp 19   Ht 5' 6.5" (1.689 m)   Wt 47.6 kg   SpO2 97%   BMI 16.69 kg/m   Physical Exam Vitals signs and nursing note reviewed.  Constitutional:      General: She is not in acute distress.    Appearance: She is well-developed. She is not diaphoretic.     Comments: Chronically thin/frail appearing.  HENT:     Head: Normocephalic and atraumatic.  Eyes:     General: No scleral icterus.    Conjunctiva/sclera: Conjunctivae normal.  Neck:     Musculoskeletal: Normal range of motion.  Cardiovascular:     Rate and Rhythm: Normal rate and regular rhythm.     Pulses: Normal pulses.  Pulmonary:     Effort: Pulmonary effort is normal. No respiratory distress.     Breath sounds: No stridor.       Comments: Splinting with  inspiration. No respiratory distress. chest expansion symmetric. Hematoma to left posterior chest wall with overlying abrasions. TTP without crepitus. Musculoskeletal: Normal range of motion.  Skin:    General: Skin is warm and dry.     Coloration: Skin is not pale.     Findings: No erythema or rash.  Neurological:     General: No focal deficit present.     Mental Status: She is alert and oriented to person, place, and time.     Coordination: Coordination normal.     Comments: GCS 15. Moving all extremities spontaneously.   Psychiatric:        Behavior: Behavior normal.      ED Treatments / Results  Labs (all labs ordered are listed, but only abnormal results are displayed) Labs Reviewed - No data to display  EKG None  Radiology Dg Ribs Unilateral W/chest Left  Result Date: 12/13/2018 CLINICAL DATA:  Rib pain EXAM: LEFT RIBS AND CHEST - 3+ VIEW COMPARISON:  None. FINDINGS: No fracture or other bone lesions are seen involving the ribs, however somewhat limited due to overlying calcified breast prostheses. There is no evidence of pneumothorax or pleural effusion. Both lungs are clear. Aortic knob calcifications. Heart size and mediastinal contours are within normal limits. IMPRESSION: No definite rib fracture, however somewhat limited. Electronically Signed   By: Prudencio Pair M.D.   On: 12/13/2018 00:33    Procedures Procedures (including critical care time)  Medications Ordered in ED Medications  lidocaine (LIDODERM) 5 % 1 patch (1 patch Transdermal Patch Applied 12/13/18 0156)  oxyCODONE-acetaminophen (PERCOCET/ROXICET) 5-325 MG per tablet 1 tablet (1 tablet Oral Given 12/13/18 0156)  promethazine (PHENERGAN) tablet 25 mg (25 mg Oral Given 12/13/18 0156)  ketorolac (TORADOL) 30 MG/ML injection 30 mg (30  mg Intramuscular Given 12/13/18 0156)  methocarbamol (ROBAXIN) tablet 750 mg (750 mg Oral Given 12/13/18 0220)     Initial Impression / Assessment and Plan / ED Course  I have  reviewed the triage vital signs and the nursing notes.  Pertinent labs & imaging results that were available during my care of the patient were reviewed by me and considered in my medical decision making (see chart for details).        73 year old female presents to the emergency department following a fall at home.  Presenting with abrasion and contusion to her left posterior chest wall associated with point tenderness.  There is no evidence of fracture on x-ray, though imaging is limited.  Did discuss with patient that she may have nondisplaced fractures that were not identified on x-ray.  Her pain has been better controlled in the ED following administration of Toradol as well as a Lidoderm patch and Percocet.  Given a dose of her home Robaxin which she did not take tonight; previously reported taking, but later stated that she forgot.  Instructed to continue use of an incentive spirometer at home to ensure deep breathing.  Will refer to primary care for follow-up.   Final Clinical Impressions(s) / ED Diagnoses   Final diagnoses:  Contusion of left chest wall, initial encounter  Fall, initial encounter    ED Discharge Orders         Ordered    oxyCODONE-acetaminophen (PERCOCET/ROXICET) 5-325 MG tablet  Every 6 hours PRN     12/13/18 0257    lidocaine (LIDODERM) 5 %  Every 24 hours     12/13/18 0257    promethazine (PHENERGAN) 25 MG tablet  Every 6 hours PRN     12/13/18 0303           Antonietta Breach, PA-C 12/13/18 0339    Ward, Delice Bison, DO 12/13/18 0630

## 2018-12-13 NOTE — Telephone Encounter (Signed)
Alicia Chandler, NP  Alicia [name], CMA        She needs follow up, she probably has not followed up with me because she was very adamate about seeing Dr Mariea Clonts -someone in scheduling took her off Dr Magdalene Molly scheduled and put her back on mine when she had requested to see Dr Mariea Clonts in the past. This was approved by Dr Mariea Clonts (back [name] while ago so not sure why it got changed) if she puts up [name] fuss just put her on Dr Magdalene Molly schedule.  Thanks        Tried calling Patient. LMOM to return call to schedule an appointment.

## 2018-12-16 ENCOUNTER — Ambulatory Visit (INDEPENDENT_AMBULATORY_CARE_PROVIDER_SITE_OTHER): Payer: Medicare Other | Admitting: Nurse Practitioner

## 2018-12-16 ENCOUNTER — Encounter: Payer: Self-pay | Admitting: Nurse Practitioner

## 2018-12-16 ENCOUNTER — Other Ambulatory Visit: Payer: Self-pay

## 2018-12-16 DIAGNOSIS — S20212D Contusion of left front wall of thorax, subsequent encounter: Secondary | ICD-10-CM

## 2018-12-16 DIAGNOSIS — T148XXA Other injury of unspecified body region, initial encounter: Secondary | ICD-10-CM | POA: Diagnosis not present

## 2018-12-16 MED ORDER — OXYCODONE-ACETAMINOPHEN 5-325 MG PO TABS: 1.0000 | ORAL_TABLET | Freq: Four times a day (QID) | ORAL | 0 refills | Status: DC | PRN

## 2018-12-16 NOTE — Telephone Encounter (Signed)
Patient scheduled a TeleVisit with Janett Billow due to wanting Refill on Oxycodone. Cannot come into office due to pain (cracked ribs).

## 2018-12-16 NOTE — Progress Notes (Signed)
This service is provided via telemedicine  No vital signs collected/recorded due to the encounter was a telemedicine visit.   Location of patient (ex: home, work):  Home  Patient consents to a telephone visit: Yes  Location of the provider (ex: office, home): [facility], Office   Name of any referring provider:  N/A  Names of all persons participating in the telemedicine service and their role in the encounter:  S.Chrae B/CMA, Sherrie Mustache, NP, and Patient   Time spent on call:  13 min with medical assistant      Careteam: Patient Care Team: Lauree Chandler, NP as PCP - General (Geriatric Medicine) Deliah Goody, PA-C as Consulting Physician (Physician Assistant) Danella Sensing, MD as Consulting Physician (Dermatology) Everett Graff, MD as Consulting Physician (Obstetrics and Gynecology) Vicie Mutters, MD as Consulting Physician (Otolaryngology) Marshell Garfinkel, MD as Consulting Physician (Pulmonary Disease) Gayla Medicus, MD as Referring Physician (Neurology)  Advanced Directive information    Allergies  Allergen Reactions  . Biaxin [Clarithromycin] Anaphylaxis  . Sulfonamide Derivatives Itching and Rash  . Tequin [Gatifloxacin] Anaphylaxis  . Demerol Nausea And Vomiting  . Lactose Intolerance (Gi) Other (See Comments)    Reaction:  Abdominal pain, gas, bloating  . Nsaids Other (See Comments)    Reaction:  GI upset/heartburn   . Risedronate Sodium Nausea And Vomiting and Other (See Comments)    Reaction:  GI upset/heartburn   . Rofecoxib Nausea And Vomiting and Other (See Comments)    Reaction:  GI upset/heartburn   . Alendronate Sodium Nausea And Vomiting  . Aspirin Other (See Comments)    Reaction:  Abdominal pain   . Fish Allergy Nausea And Vomiting  . Benadryl [Diphenhydramine Hcl] Palpitations    Chief Complaint  Patient presents with  . Acute Visit    Follow-up from fall, requesting refill on Oxycodone. Moderate fall risk. Telephone  visit      HPI: Patient is a 73 y.o. female  Climbed in a chair to kill a bug on the ceiling- knew she should have not been climbing on the chair-Crashed into the floor and the dressing.  Ribs hit which caused a lot of pain. Had to call and ambulance to go to the hospital.  No definite fracture noted.  the ED she was givenToradol as well as a Lidoderm patch and Percocet.  educated to  continue use of an incentive spirometer at home to ensure deep breathing. She is using this.  Pain and muscle spasms are horrible and continue to be. She was only given oxy 10 tablets and she has been using around the clock.  Using ice as well.  Getting in and out of the bed is difficult   Abrasion to back- no signs of infection. Not oozing. Keeping area clean. Husband is keeping an eye on it.    Review of Systems:  Review of Systems  Constitutional: Positive for malaise/fatigue. Negative for chills and fever.  Respiratory: Negative for shortness of breath.   Cardiovascular: Positive for chest pain (due to contusion). Negative for palpitations.  Musculoskeletal: Positive for back pain, falls and myalgias. Negative for neck pain.    Past Medical History:  Diagnosis Date  . Bronchitis   . 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 Chronic migraine without aura, intractable, without status migrainosus   . Diverticulosis 2010  . Endometriosis   . Low sodium levels   . MD (muscular dystrophy) ([place])   . Meniere's disease, bilateral   .  [facility]  . Peripheral neuropathy    Dr Erling Cruz  . Pneumonia   . Sensorineural hearing loss, bilateral   . Vertigo 2019   Past Surgical History:  Procedure Laterality Date  . ABDOMINAL HYSTERECTOMY  1980 or 1981   for Endometriosis  . 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 BREAST ENHANCEMENT SURGERY Bilateral 1979  . COLONOSCOPY  01/2012   Tics; Dr Teena Irani  . ESI      X 3 in 2003 & 02/17/2011 for L4-S1 symptoms  .  LUMBAR LAMINECTOMY  12/2012   W-S , [place]  . TONSILLECTOMY  1950 or 1951  . TUBAL LIGATION    . vocal cords stripped  1970   Social History:   reports that she has never smoked. She has never used smokeless tobacco. She reports previous alcohol use of about 2.0 standard drinks of alcohol per week. She reports that she does not use drugs.  Family History  Problem Relation Age of Onset  . Alcohol abuse Mother   . Hypertension Mother   . Diabetes Mother 25  . Stroke Mother 94  . Dementia Mother   . Kidney failure Mother   . Osteoporosis Mother   . Alzheimer's disease Father 50  . Cancer Maternal Aunt 80       pancreatic  . Arthritis Sister 87       knee replacement  . Hyperlipidemia Sister   . Lung disease Maternal Grandmother   . Lung disease Maternal Grandfather   . Hyperlipidemia Sister 65  . Other Son        recent back injury  . Other Daughter        recent back injury; knee issues when playing tennis  . Heart disease Neg Hx   . Heart attack Neg Hx     Medications: Patient's Medications  New Prescriptions   No medications on file  Previous Medications   ATENOLOL (TENORMIN) 25 MG TABLET    Take 25 mg by mouth as needed (for severe tachycardia lasting more then 20 minutes).   BACLOFEN (LIORESAL) 10 MG TABLET    Take 10 mg by mouth at bedtime.    BIOTIN 5000 MCG TABS    Take 5,000 mcg by mouth daily.    BUTALBITAL-ACETAMINOPHEN-CAFFEINE (FIORICET, ESGIC) 50-325-40 MG TABLET    Take 1 tablet by mouth every 6 (six) hours as needed (for severe pain).    CALCIUM PO    Take 400 mg by mouth 3 (three) times daily.    CARBOXYMETHYLCELLULOSE SODIUM (THERATEARS) 0.25 % SOLN    Apply 1 drop to eye 3 (three) times daily.   CHOLECALCIFEROL (VITAMIN D3) LIQD    Place 1,000 Units under the tongue daily.    EVENING PRIMROSE OIL 1000 MG CAPS    Take 1,000 mg by mouth every other day.    GABAPENTIN (NEURONTIN) 100 MG CAPSULE    Take 200 mg by mouth at bedtime.    LACTASE (LACTAID) 3000  UNITS TABLET    Take 3,000 Units by mouth 3 (three) times daily as needed (when eating dairy products).   LIDOCAINE (LIDODERM) 5 %    Place 1 patch onto the skin daily. Remove & Discard patch within 12 hours or as directed by MD   LUTEIN 20 MG TABS    Take 20 mg by mouth daily.    MAGNESIUM PO    Take 300 mg by mouth at  bedtime.   METHOCARBAMOL (ROBAXIN) 750 MG TABLET    Take 750 mg by mouth every 6 (six) hours as needed for muscle spasms.    MULTIPLE VITAMIN (MULTIVITAMIN WITH MINERALS) TABS TABLET    Take 1 tablet by mouth daily.   MULTIPLE VITAMINS-MINERALS (PRESERVISION AREDS 2) CAPS    Take 1 capsule by mouth 2 (two) times daily.   OMEGA-3 1000 MG CAPS    Take 1 capsule by mouth 2 (two) times daily.   ONDANSETRON (ZOFRAN ODT) 4 MG DISINTEGRATING TABLET    4mg  ODT q6 hours prn nausea/vomit   OXYCODONE-ACETAMINOPHEN (PERCOCET/ROXICET) 5-325 MG TABLET    Take 1 tablet by mouth every 6 (six) hours as needed for severe pain.   POLYETHYLENE GLYCOL (MIRALAX / GLYCOLAX) PACKET    Take 17 g by mouth daily.   PROMETHAZINE (PHENERGAN) 25 MG TABLET    Take 0.5-1 tablets (12.5-25 mg total) by mouth every 6 (six) hours as needed for nausea or vomiting.   RIZATRIPTAN (MAXALT-MLT) 10 MG DISINTEGRATING TABLET    Take 10 mg by mouth as needed for migraine.    TETRAHYDROZOLINE 0.05 % OPHTHALMIC SOLUTION    Place 1 drop into both eyes as needed (for eye redness).  Modified Medications   No medications on file  Discontinued Medications   OMEPRAZOLE PO    Take 20 mg by mouth 2 (two) times daily.   VITAMIN E 400 UNIT CAPSULE    Take 400 Units by mouth daily.    Physical Exam:  There were no vitals filed for this visit. There is no height or weight on file to calculate BMI. Wt Readings from Last 3 Encounters:  12/13/18 105 lb (47.6 kg)  08/02/18 106 lb (48.1 kg)  05/08/18 109 lb (49.4 kg)    Labs reviewed: Basic Metabolic Panel: Recent Labs    03/19/18 1005 05/08/18 1242 08/02/18 1621  NA 131*  133* 131*  K 5.0 4.3 4.3  CL 94* 96* 94*  CO2 32 32 27  GLUCOSE 89 95 97  BUN 11 10 15   CREATININE 0.51* 0.63 0.55*  CALCIUM 9.5 9.2 9.5   Liver Function Tests: Recent Labs    03/19/18 1005 05/08/18 1242  AST 57* 28  ALT 67* 24  BILITOT 0.5 0.5  PROT 6.6 6.4   Recent Labs    05/08/18 1242  LIPASE 21  AMYLASE 37   No results for input(s): AMMONIA in the last 8760 hours. CBC: Recent Labs    03/19/18 1005 05/08/18 1242 08/02/18 1621  WBC 4.3 4.4 5.2  NEUTROABS 2,911 3,414 3,978  HGB 13.8 13.3 13.5  HCT 39.5 39.2 40.9  MCV 90.0 90.3 89.7  PLT 211 236 254   Lipid Panel: Recent Labs    03/19/18 1005  CHOL 192  HDL 88  LDLCALC 91  TRIG 52  CHOLHDL 2.2   TSH: No results for input(s): TSH in the last 8760 hours. A1C: Lab Results  Component Value Date   HGBA1C 5.6 03/03/2011     Assessment/Plan 1. Contusion of left chest wall, subsequent encounter -ongoing severe pain. Refill provided for oxycodone-apap every 6 hours as needed for severe pain at this time for 5 day supply. Can take additional tylenol 325 mg by mouth every 6 hours as needed to help with pain control. She is also taking robaxin but this is her previous schedule medication that she took for MD.  She is using lidocaine patch to help with the pain- only apply to intact skin -  cont to apply ice 3 times daily throughout the day.  - oxyCODONE-acetaminophen (PERCOCET/ROXICET) 5-325 MG tablet; Take 1 tablet by mouth every 6 (six) hours as needed for severe pain.  Dispense: 20 tablet; Refill: 0  2. Abrasion Noted to back, her husband is helping her monitor this. Without signs of infection at this time.   Next appt: 12/20/2018 Carlos American. Harle Battiest  [facility] [phone]    Virtual Visit via Telephone Note  I connected with pt on 12/16/18 at  2:15 PM EDT by telephone and verified that I am speaking with the correct person using two identifiers.  Location:  Patient: home Provider: office   I discussed the limitations, risks, security and privacy concerns of performing an evaluation and management service by telephone and the availability of in person appointments. I also discussed with the patient that there may be a patient responsible charge related to this service. The patient expressed understanding and agreed to proceed.   I discussed the assessment and treatment plan with the patient. The patient was provided an opportunity to ask questions and all were answered. The patient agreed with the plan and demonstrated an understanding of the instructions.   The patient was advised to call back or seek an in-person evaluation if the symptoms worsen or if the condition fails to improve as anticipated.  I provided 18 minutes of non-face-to-face time during this encounter.  Carlos American. Harle Battiest Avs printed and mailed

## 2018-12-16 NOTE — Patient Instructions (Signed)
Continue oxycodone-apap 5-325 Can take additional

## 2018-12-20 ENCOUNTER — Ambulatory Visit: Payer: Medicare Other | Admitting: Nurse Practitioner

## 2019-01-10 ENCOUNTER — Telehealth: Payer: Self-pay | Admitting: *Deleted

## 2019-01-10 NOTE — Telephone Encounter (Signed)
To push fluids, increase hydration if symptoms persist will need to collect urine sample before treatment can be given

## 2019-01-10 NOTE — Telephone Encounter (Signed)
Patient called and stated that she feels like she has a UTI. Stated that she is having pain and cramping with urination and bleeding (has to wear a pad). Stated that she has broken ribs and cannot travel. Stated that she feels like the UTI came from having diarrhea. Patient is requesting Cipro treatment. Stated that this is what helped her in the past. Patient has increased water and taking Cranberry Capsules.   Patient called c/o possible UrinaryTract Infection (UTI)  1. What symptoms are you having (frequency, urgency, dysuria, incontinence, confusion)? Pain and cramping and frequency and bleeding  2. Any fever or chills? none  3. Any suprapubic pain? Yes with cramping with voiding  4. Have you taken anything OTC for symptoms?  5. How much water are you drinking daily? Wasn't drinking enough due to broken ribs but now has increased  6. How long have you had your symptoms (onset)? Started this afternoon.   I will forward this information to your provider and call with instructions, if your symptoms persist or progress seek medical attention at your nearest urgent care or emergency room. Patient verbalized understanding.

## 2019-01-10 NOTE — Telephone Encounter (Signed)
Patient notified and agreed. Will call Monday if no better.

## 2019-02-03 ENCOUNTER — Other Ambulatory Visit: Payer: Self-pay | Admitting: Family

## 2019-02-03 DIAGNOSIS — R110 Nausea: Secondary | ICD-10-CM

## 2019-02-10 DIAGNOSIS — N951 Menopausal and female climacteric states: Secondary | ICD-10-CM | POA: Diagnosis not present

## 2019-02-10 DIAGNOSIS — Z01419 Encounter for gynecological examination (general) (routine) without abnormal findings: Secondary | ICD-10-CM | POA: Diagnosis not present

## 2019-02-10 DIAGNOSIS — R5383 Other fatigue: Secondary | ICD-10-CM | POA: Diagnosis not present

## 2019-02-18 ENCOUNTER — Other Ambulatory Visit: Payer: Self-pay

## 2019-02-18 ENCOUNTER — Telehealth: Payer: Self-pay

## 2019-02-18 ENCOUNTER — Ambulatory Visit (INDEPENDENT_AMBULATORY_CARE_PROVIDER_SITE_OTHER): Payer: Medicare Other

## 2019-02-18 DIAGNOSIS — Z23 Encounter for immunization: Secondary | ICD-10-CM

## 2019-02-18 NOTE — Telephone Encounter (Signed)
Yes it is okay to have flu shot IM in the gluteal region

## 2019-02-18 NOTE — Telephone Encounter (Signed)
Patient states she has MS and is demanding to get a flu shot in her hip. She can't find anyone else to do it after [facility] according to the patient.

## 2019-02-19 DIAGNOSIS — M8580 Other specified disorders of bone density and structure, unspecified site: Secondary | ICD-10-CM | POA: Diagnosis not present

## 2019-02-19 LAB — HM DEXA SCAN

## 2019-03-18 ENCOUNTER — Other Ambulatory Visit: Payer: Self-pay | Admitting: *Deleted

## 2019-03-18 DIAGNOSIS — R110 Nausea: Secondary | ICD-10-CM

## 2019-03-18 MED ORDER — ONDANSETRON 4 MG PO TBDP: ORAL_TABLET | ORAL | 0 refills | Status: DC

## 2019-03-18 NOTE — Telephone Encounter (Signed)
[employer]

## 2019-03-19 ENCOUNTER — Encounter: Payer: Medicare Other | Admitting: Nurse Practitioner

## 2019-03-28 ENCOUNTER — Other Ambulatory Visit: Payer: Self-pay

## 2019-03-28 ENCOUNTER — Ambulatory Visit (INDEPENDENT_AMBULATORY_CARE_PROVIDER_SITE_OTHER): Payer: Medicare Other | Admitting: Nurse Practitioner

## 2019-03-28 ENCOUNTER — Encounter: Payer: Self-pay | Admitting: Nurse Practitioner

## 2019-03-28 DIAGNOSIS — Z0000 Encounter for general adult medical examination without abnormal findings: Secondary | ICD-10-CM

## 2019-03-28 DIAGNOSIS — Z1159 Encounter for screening for other viral diseases: Secondary | ICD-10-CM

## 2019-03-28 NOTE — Patient Instructions (Signed)
Alicia [name] , Thank you for taking time to come for your Medicare Wellness Visit. I appreciate your ongoing commitment to your health goals. Please review the following plan we discussed and let me know if I can assist you in the future.   Screening recommendations/referrals: Colonoscopy - please have your GI office send [place] your last colonoscopy Mammogram -na Bone Density -please have your GYN send [place] your lastest Bone Density  Recommended yearly ophthalmology/optometry visit for glaucoma screening and checkup Recommended yearly dental visit for hygiene and checkup  Vaccinations: Influenza vaccine up to date Pneumococcal vaccine up to date Tdap vaccine up to date Shingles vaccine -recommended but to get after COVID vaccine.  COVID vaccine- to make appt for your vaccine COVID-19 Vaccine Information can be found at: [email or web address].co.uk For questions related to vaccine distribution or appointments, please email [email or web address] or call [phone].   Advanced directives: to bring updated documents to office so we can place on file  Conditions/risks identified: falls due to weakness  Next appointment: 1 year for AVw   Preventive Care 65 Years and Older, Female Preventive care refers to lifestyle choices and visits with your health care provider that can promote health and wellness. What does preventive care include?  A yearly physical exam. This is also called an annual well check.  Dental exams once or twice a year.  Routine eye exams. Ask your health care provider how often you should have your eyes checked.  Personal lifestyle choices, including:  Daily care of your teeth and gums.  Regular physical activity.  Eating a healthy diet.  Avoiding tobacco and drug use.  Limiting alcohol use.  Practicing safe sex.  Taking low-dose aspirin every day.  Taking vitamin and mineral supplements as recommended by your  health care provider. What happens during an annual well check? The services and screenings done by your health care provider during your annual well check will depend on your age, overall health, lifestyle risk factors, and family history of disease. Counseling  Your health care provider may ask you questions about your:  Alcohol use.  Tobacco use.  Drug use.  Emotional well-being.  Home and relationship well-being.  Sexual activity.  Eating habits.  History of falls.  Memory and ability to understand (cognition).  Work and work Statistician.  Reproductive health. Screening  You may have the following tests or measurements:  Height, weight, and BMI.  Blood pressure.  Lipid and cholesterol levels. These may be checked every 5 years, or more frequently if you are over 55 years old.  Skin check.  Lung cancer screening. You may have this screening every year starting at age 65 if you have a 30-pack-year history of smoking and currently smoke or have quit within the past 15 years.  Fecal occult blood test (FOBT) of the stool. You may have this test every year starting at age 29.  Flexible sigmoidoscopy or colonoscopy. You may have a sigmoidoscopy every 5 years or a colonoscopy every 10 years starting at age 46.  Hepatitis C blood test.  Hepatitis B blood test.  Sexually transmitted disease (STD) testing.  Diabetes screening. This is done by checking your blood sugar (glucose) after you have not eaten for a while (fasting). You may have this done every 1-3 years.  Bone density scan. This is done to screen for osteoporosis. You may have this done starting at age 24.  Mammogram. This may be done every 1-2 years. Talk to your health care provider about how often you  should have regular mammograms. Talk with your health care provider about your test results, treatment options, and if necessary, the need for more tests. Vaccines  Your health care provider may recommend  certain vaccines, such as:  Influenza vaccine. This is recommended every year.  Tetanus, diphtheria, and acellular pertussis (Tdap, Td) vaccine. You may need a Td booster every 10 years.  Zoster vaccine. You may need this after age 86.  Pneumococcal 13-valent conjugate (PCV13) vaccine. One dose is recommended after age 71.  Pneumococcal polysaccharide (PPSV23) vaccine. One dose is recommended after age 12. Talk to your health care provider about which screenings and vaccines you need and how often you need them. This information is not intended to replace advice given to you by your health care provider. Make sure you discuss any questions you have with your health care provider. Document Released: 03/19/2015 Document Revised: 11/10/2015 Document Reviewed: 12/22/2014 Elsevier Interactive Patient Education  2017 [employer] Prevention in the Home Falls can cause injuries. They can happen to people of all ages. There are many things you can do to make your home safe and to help prevent falls. What can I do on the outside of my home?  Regularly fix the edges of walkways and driveways and fix any cracks.  Remove anything that might make you trip as you walk through a door, such as a raised step or threshold.  Trim any bushes or trees on the path to your home.  Use bright outdoor lighting.  Clear any walking paths of anything that might make someone trip, such as rocks or tools.  Regularly check to see if handrails are loose or broken. Make sure that both sides of any steps have handrails.  Any raised decks and porches should have guardrails on the edges.  Have any leaves, snow, or ice cleared regularly.  Use sand or salt on walking paths during winter.  Clean up any spills in your garage right away. This includes oil or grease spills. What can I do in the bathroom?  Use night lights.  Install grab bars by the toilet and in the tub and shower. Do not use towel bars as  grab bars.  Use non-skid mats or decals in the tub or shower.  If you need to sit down in the shower, use a plastic, non-slip stool.  Keep the floor dry. Clean up any water that spills on the floor as soon as it happens.  Remove soap buildup in the tub or shower regularly.  Attach bath mats securely with double-sided non-slip rug tape.  Do not have throw rugs and other things on the floor that can make you trip. What can I do in the bedroom?  Use night lights.  Make sure that you have a light by your bed that is easy to reach.  Do not use any sheets or blankets that are too big for your bed. They should not hang down onto the floor.  Have a firm chair that has side arms. You can use this for support while you get dressed.  Do not have throw rugs and other things on the floor that can make you trip. What can I do in the kitchen?  Clean up any spills right away.  Avoid walking on wet floors.  Keep items that you use a lot in easy-to-reach places.  If you need to reach something above you, use a strong step stool that has a grab bar.  Keep electrical cords out  of the way.  Do not use floor polish or wax that makes floors slippery. If you must use wax, use non-skid floor wax.  Do not have throw rugs and other things on the floor that can make you trip. What can I do with my stairs?  Do not leave any items on the stairs.  Make sure that there are handrails on both sides of the stairs and use them. Fix handrails that are broken or loose. Make sure that handrails are as long as the stairways.  Check any carpeting to make sure that it is firmly attached to the stairs. Fix any carpet that is loose or worn.  Avoid having throw rugs at the top or bottom of the stairs. If you do have throw rugs, attach them to the floor with carpet tape.  Make sure that you have a light switch at the top of the stairs and the bottom of the stairs. If you do not have them, ask someone to add them  for you. What else can I do to help prevent falls?  Wear shoes that:  Do not have high heels.  Have rubber bottoms.  Are comfortable and fit you well.  Are closed at the toe. Do not wear sandals.  If you use a stepladder:  Make sure that it is fully opened. Do not climb a closed stepladder.  Make sure that both sides of the stepladder are locked into place.  Ask someone to hold it for you, if possible.  Clearly mark and make sure that you can see:  Any grab bars or handrails.  First and last steps.  Where the edge of each step is.  Use tools that help you move around (mobility aids) if they are needed. These include:  Canes.  Walkers.  Scooters.  Crutches.  Turn on the lights when you go into a dark area. Replace any light bulbs as soon as they burn out.  Set up your furniture so you have a clear path. Avoid moving your furniture around.  If any of your floors are uneven, fix them.  If there are any pets around you, be aware of where they are.  Review your medicines with your doctor. Some medicines can make you feel dizzy. This can increase your chance of falling. Ask your doctor what other things that you can do to help prevent falls. This information is not intended to replace advice given to you by your health care provider. Make sure you discuss any questions you have with your health care provider. Document Released: 12/17/2008 Document Revised: 07/29/2015 Document Reviewed: 03/27/2014 Elsevier Interactive Patient Education  2017 [employer].

## 2019-03-28 NOTE — Progress Notes (Signed)
   This service is provided via telemedicine  No vital signs collected/recorded due to the encounter was a telemedicine visit.   Location of patient (ex: home, work): Home  Patient consents to a telephone visit:  Yes  Location of the provider (ex: office, home):  [facility], Office   Name of any referring provider:  N/A  Names of all persons participating in the telemedicine service and their role in the encounter:  S.Chrae B/CMA, Jessica Eubanks, NP, and Patient   Time spent on call:  18 min with medical assistant   

## 2019-03-28 NOTE — Progress Notes (Signed)
Subjective:   Alicia [name] is a 74 y.o. female who presents for Medicare Annual (Subsequent) preventive examination.  Review of Systems:   Cardiac Risk Factors include: advanced age (>23men, >12 women)     Objective:     Vitals: There were no vitals taken for this visit.  There is no height or weight on file to calculate BMI.  Advanced Directives 03/28/2019 12/13/2018 03/19/2018 02/14/2018 10/24/2015 10/15/2014  Does Patient Have a Medical Advance Directive? Yes No No No No No  Type of Paramedic of [place];Living will - - - - -  Does patient want to make changes to medical advance directive? No - Patient declined - - - - -  Copy of [place] in Chart? No - copy requested - - - - -  Would patient like information on creating a medical advance directive? - No - Patient declined Yes (MAU/Ambulatory/Procedural Areas - Information given) - No - patient declined information No - patient declined information    Tobacco Social History   Tobacco Use  Smoking Status Never Smoker  Smokeless Tobacco Never Used     Counseling given: Not Answered   Clinical Intake:  Pre-visit preparation completed: No  Pain : 0-10 Pain Score: 5  Pain Type: Chronic pain Pain Location: Other (Comment)(generalized) Pain Orientation: (mostly in neck, upper back and arms) Pain Descriptors / Indicators: Aching, Sore Pain Onset: More than a month ago Pain Frequency: Intermittent Pain Relieving Factors: sitting down applying heat Effect of Pain on Daily Activities: not able to stand up, has to sit down, weakness.  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 Bronchitis   . 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 Chronic migraine without aura, intractable, without status migrainosus   . Diverticulosis 2010  . Endometriosis   . Low sodium levels   . MD (muscular dystrophy) ([place])   . Meniere's disease, bilateral   . [facility]  . Peripheral neuropathy    Dr Erling Cruz  . Pneumonia   . Sensorineural hearing loss, bilateral   . Vertigo 2019   Past Surgical History:  Procedure Laterality Date  . ABDOMINAL HYSTERECTOMY  1980 or 1981   for Endometriosis  . 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 BREAST ENHANCEMENT SURGERY Bilateral 1979  . COLONOSCOPY  01/2012   Tics; Dr Teena Irani  . ESI      X 3 in 2003 & 02/17/2011 for L4-S1 symptoms  . LUMBAR LAMINECTOMY  12/2012   [place]S , [place]  . TONSILLECTOMY  1950 or 1951  . TUBAL LIGATION    . vocal cords stripped  1970   Family History  Problem Relation Age of Onset  . Alcohol abuse Mother   . Hypertension Mother   . Diabetes Mother 47  . Stroke Mother 85  . Dementia Mother   . Kidney failure Mother   . Osteoporosis Mother   . Alzheimer's disease Father 62  .  Cancer Maternal Aunt 80       pancreatic  . Arthritis Sister 73       knee replacement  . Hyperlipidemia Sister   . Lung disease Maternal Grandmother   . Lung disease Maternal Grandfather   . Hyperlipidemia Sister 60  . Other Son        recent back injury  . Other Daughter        recent back injury; knee issues when playing tennis  . Heart disease Neg Hx   . Heart attack Neg Hx    Social History   Socioeconomic History  . Marital status: Married    Spouse name: Not on file  . Number of children: Not on file  . Years of education: Not on file  . Highest education level: Not on file  Occupational History   . Not on file  Tobacco Use  . Smoking status: Never Smoker  . Smokeless tobacco: Never Used  Substance and Sexual Activity  . Alcohol use: Not Currently    Alcohol/week: 2.0 standard drinks    Types: 2 Glasses of wine per week  . Drug use: No  . Sexual activity: Not Currently  Other Topics Concern  . Not on file  Social History Narrative   Social History      Diet? Trying to gain weight      Do you drink/eat things with caffeine? 1 mug hot tea with breakfast per day      Marital status?  Married 58 years                                  What year were you married? 1970      Do you live in a house, apartment, assisted living, condo, trailer, etc.? House (46 years)      Is it one or more stories? 2 stories and walk up finished attic      How many persons live in your home? 2      Do you have any pets in your home? (please list) none      Highest level of education completed? 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     yes                                 Type & how often? Walk in neighborhood 4 - 6 times per week if possible      Advanced Directives (Lawyer just beginning to work on these things)      Do you have a living will?      Do you have a DNR form?                    no              If not, do you want to discuss one?      Do you have signed POA/HPOA for forms?       Functional Status      Do you have difficulty bathing or dressing yourself? no      Do you have difficulty preparing food or eating? no      Do you have difficulty managing your medications? no  Do you have difficulty managing your finances? no      Do you have difficulty affording your medications? no   Social Determinants of Health   Financial Resource Strain:   . Difficulty of Paying Living Expenses: Not on file  Food Insecurity:   . Worried About Charity fundraiser in  the Last Year: Not on file  . Ran Out of Food in the Last Year: Not on file  Transportation Needs:   . Lack of Transportation (Medical): Not on file  . Lack of Transportation (Non-Medical): Not on file  Physical Activity:   . Days of Exercise per Week: Not on file  . Minutes of Exercise per Session: Not on file  Stress:   . Feeling of Stress : Not on file  Social Connections:   . Frequency of Communication with Friends and Family: Not on file  . Frequency of Social Gatherings with Friends and Family: Not on file  . Attends Religious Services: Not on file  . Active Member of Clubs or Organizations: Not on file  . Attends Archivist Meetings: Not on file  . Marital Status: Not on file    Outpatient Encounter Medications as of 03/28/2019  Medication Sig  . atenolol (TENORMIN) 25 MG tablet Take 25 mg by mouth as needed (for severe tachycardia lasting more then 20 minutes).  . baclofen (LIORESAL) 10 MG tablet Take 10 mg by mouth at bedtime.   . Biotin 5000 MCG TABS Take 5,000 mcg by mouth daily.   . butalbital-acetaminophen-caffeine (FIORICET, ESGIC) 50-325-40 MG tablet Take 1 tablet by mouth every 6 (six) hours as needed (for severe pain).   . CALCIUM PO Take 400 mg by mouth 3 (three) times daily.   . Carboxymethylcellulose Sodium (THERATEARS) 0.25 % SOLN Apply 1 drop to eye 3 (three) times daily.  . Cholecalciferol (VITAMIN D3) LIQD 1 drop by Does not apply route 3 (three) times a week.  . Evening Primrose Oil 1000 MG CAPS Take 1,000 mg by mouth every other day.   . gabapentin (NEURONTIN) 100 MG capsule Take 200 mg by mouth at bedtime.   Marland Kitchen lactase (LACTAID) 3000 units tablet Take 3,000 Units by mouth as needed (when eating dairy products).   . Lutein 20 MG TABS Take 20 mg by mouth daily.   Marland Kitchen MAGNESIUM CITRATE PO Take 150 mg by mouth 2 (two) times daily.  . methocarbamol (ROBAXIN) 750 MG tablet Take 750 mg by mouth every 6 (six) hours as needed for muscle spasms.   . Multiple  Vitamin (MULTIVITAMIN WITH MINERALS) TABS tablet Take 1 tablet by mouth daily.  . Multiple Vitamins-Minerals (PRESERVISION AREDS 2) CAPS Take 1 capsule by mouth 2 (two) times daily.  . Omega-3 1000 MG CAPS Take 1 capsule by mouth 2 (two) times daily.  . ondansetron (ZOFRAN-ODT) 4 MG disintegrating tablet TAKE 1 TABLET BY MOUTH EVERY 6 HOURS AS NEEDED FOR NAUSEA/VOMIT  . promethazine (PHENERGAN) 25 MG tablet Take 0.5-1 tablets (12.5-25 mg total) by mouth every 6 (six) hours as needed for nausea or vomiting.  . rizatriptan (MAXALT-MLT) 10 MG disintegrating tablet Take 10 mg by mouth as needed for migraine.   . lidocaine (LIDODERM) 5 % Place 1 patch onto the skin daily. Remove & Discard patch within 12 hours or as directed by MD (Patient not taking: Reported on 03/28/2019)  . [DISCONTINUED] Cholecalciferol (VITAMIN D3) LIQD Place 1,000 Units under the tongue daily.   . [DISCONTINUED] MAGNESIUM PO Take 300 mg by mouth  at bedtime.  . [DISCONTINUED] oxyCODONE-acetaminophen (PERCOCET/ROXICET) 5-325 MG tablet Take 1 tablet by mouth every 6 (six) hours as needed for severe pain.  . [DISCONTINUED] polyethylene glycol (MIRALAX / GLYCOLAX) packet Take 17 g by mouth daily.  . [DISCONTINUED] tetrahydrozoline 0.05 % ophthalmic solution Place 1 drop into both eyes as needed (for eye redness).   No facility-administered encounter medications on file as of 03/28/2019.    Activities of Daily Living In your present state of health, do you have any difficulty performing the following activities: 03/28/2019  Hearing? Y  Vision? N  Difficulty concentrating or making decisions? N  Walking or climbing stairs? N  Dressing or bathing? N  Doing errands, shopping? N  Preparing Food and eating ? Y  Comment some trouble swallowing, following with GI  Using the Toilet? N  In the past six months, have you accidently leaked urine? Y  Do you have problems with loss of bowel control? N  Managing your Medications? N  Managing  your Finances? N  Housekeeping or managing your Housekeeping? N  Some recent data might be hidden    Patient Care Team: Lauree Chandler, NP as PCP - General (Geriatric Medicine) Deliah Goody, PA-C as Consulting Physician (Physician Assistant) Danella Sensing, MD as Consulting Physician (Dermatology) Everett Graff, MD as Consulting Physician (Obstetrics and Gynecology) Vicie Mutters, MD as Consulting Physician (Otolaryngology) Marshell Garfinkel, MD as Consulting Physician (Pulmonary Disease) Gayla Medicus, MD as Referring Physician (Neurology)    Assessment:   This is a routine wellness examination for [place].  Exercise Activities and Dietary recommendations Current Exercise Habits: Home exercise routine, Type of exercise: walking, Time (Minutes): 20, Frequency (Times/Week): 5, Weekly Exercise (Minutes/Week): 100, Intensity: Mild  Goals    . Exercise 150 min/wk Moderate Activity     To increase exercise to 5 days a week, 20 mins a day.    . Patient Stated     To get her hearing "straighten out" get better hearing aids to hear others.        Fall Risk Fall Risk  03/28/2019 12/16/2018 05/08/2018 03/19/2018 02/14/2018  Falls in the past year? 1 1 0 0 0  Number falls in past yr: 0 0 0 0 -  Injury with Fall? 1 1 - 0 -  Comment - - - - -  Risk for fall due to : Impaired balance/gait - - - -   Is the patient's home free of loose throw rugs in walkways, pet beds, electrical cords, etc?   yes      Grab bars in the bathroom? yes      Handrails on the stairs?   yes      Adequate lighting?   yes  Timed Get Up and Go performed: na  Depression Screen PHQ 2/9 Scores 03/28/2019 03/19/2018 02/14/2018 08/20/2012  PHQ - 2 Score 0 0 0 0     Cognitive Function MMSE - Mini Mental State Exam 03/19/2018  Orientation to time 5  Orientation to Place 5  Registration 3  Attention/ Calculation 5  Recall 3  Language- name 2 objects 2  Language- repeat 1  Language- follow 3 step command 3    Language- read & follow direction 1  Write a sentence 1  Copy design 1  Total score 30     6CIT Screen 03/28/2019  What Year? 0 points  What month? 0 points  What time? 0 points  Count back from 20 0 points  Months in reverse 0 points  Repeat phrase 0 points  Total Score 0    Immunization History  Administered Date(s) Administered  . Fluad Quad(high Dose 65+) 02/18/2019  . Influenza Whole 01/27/2008  . Influenza, High Dose Seasonal PF 12/05/2013, 01/25/2018  . Pneumococcal Conjugate-13 11/19/2013  . Pneumococcal Polysaccharide-23 02/14/2018  . Td 01/03/2005  . Tdap 01/03/2017  . Zoster 02/23/2009    Qualifies for Shingles Vaccine? Yes recommended.   Screening Tests Health Maintenance  Topic Date Due  . Hepatitis C Screening  22-May-1945  . COLONOSCOPY  03/03/1995  . TETANUS/TDAP  01/04/2027  . INFLUENZA VACCINE  Completed  . DEXA SCAN  Completed  . PNA vac Low Risk Adult  Completed  . MAMMOGRAM  Discontinued    Cancer Screenings: Lung: Low Dose CT Chest recommended if Age 10-80 years, 30 pack-year currently smoking OR have quit w/in 15years. Patient does not qualify. Breast:  Up to date on Mammogram? Does not get mammograms, no breast tissue Up to date of Bone Density/Dexa? Yes at Dr Mancel Bale office.  Colorectal: due at the end of the year, scheduled through [facility].   Additional Screenings:  Hepatitis C Screening: need     Plan:     I have personally reviewed and noted the following in the patient's chart:   . Medical and social history . Use of alcohol, tobacco or illicit drugs  . Current medications and supplements . Functional ability and status . Nutritional status . Physical activity . Advanced directives . List of other physicians . Hospitalizations, surgeries, and ER visits in previous 12 months . Vitals . Screenings to include cognitive, depression, and falls . Referrals and appointments  In addition, I have reviewed and discussed with  patient certain preventive protocols, quality metrics, and best practice recommendations. A written personalized care plan for preventive services as well as general preventive health recommendations were provided to patient.     Lauree Chandler, NP  03/28/2019

## 2019-04-04 ENCOUNTER — Encounter: Payer: Self-pay | Admitting: Nurse Practitioner

## 2019-04-11 DIAGNOSIS — Z1159 Encounter for screening for other viral diseases: Secondary | ICD-10-CM | POA: Diagnosis not present

## 2019-04-16 DIAGNOSIS — K317 Polyp of stomach and duodenum: Secondary | ICD-10-CM | POA: Diagnosis not present

## 2019-04-16 DIAGNOSIS — K228 Other specified diseases of esophagus: Secondary | ICD-10-CM | POA: Diagnosis not present

## 2019-04-16 DIAGNOSIS — K3189 Other diseases of stomach and duodenum: Secondary | ICD-10-CM | POA: Diagnosis not present

## 2019-04-16 DIAGNOSIS — K2930 Chronic superficial gastritis without bleeding: Secondary | ICD-10-CM | POA: Diagnosis not present

## 2019-04-16 DIAGNOSIS — R05 Cough: Secondary | ICD-10-CM | POA: Diagnosis not present

## 2019-04-16 DIAGNOSIS — K219 Gastro-esophageal reflux disease without esophagitis: Secondary | ICD-10-CM | POA: Diagnosis not present

## 2019-04-17 ENCOUNTER — Ambulatory Visit (INDEPENDENT_AMBULATORY_CARE_PROVIDER_SITE_OTHER): Payer: Medicare Other | Admitting: Family

## 2019-04-17 ENCOUNTER — Other Ambulatory Visit: Payer: Self-pay

## 2019-04-17 ENCOUNTER — Encounter: Payer: Self-pay | Admitting: Family

## 2019-04-17 DIAGNOSIS — R05 Cough: Secondary | ICD-10-CM

## 2019-04-17 DIAGNOSIS — R1310 Dysphagia, unspecified: Secondary | ICD-10-CM

## 2019-04-17 DIAGNOSIS — R059 Cough, unspecified: Secondary | ICD-10-CM

## 2019-04-17 DIAGNOSIS — R0982 Postnasal drip: Secondary | ICD-10-CM

## 2019-04-17 NOTE — Patient Instructions (Addendum)
Refer placed to ENT Dr. Tempie Hoist at [facility] on [address] Per your request recommended by Dr.Reed.

## 2019-04-17 NOTE — Progress Notes (Signed)
Patient ID: Alicia [name], female   DOB: [birth year], 75 y.o.   MRN: [id number] This service is provided via telemedicine  No vital signs collected/recorded due to the encounter was a telemedicine visit.   Location of patient (ex: home, work):  HOME  Patient consents to a telephone visit:  YES  Location of the provider (ex: office, home):  OFFICE  Name of any referring provider:  Sherrie Mustache, NP  Names of all persons participating in the telemedicine service and their role in the encounter:  PATIENT, Edwin Dada, Tatum, Marlowe Sax, NP  Time spent on call:  11:08   Provider: Wasyl Dornfeld FNP-C  Lauree Chandler, NP  Patient Care Team: Lauree Chandler, NP as PCP - General (Geriatric Medicine) Deliah Goody, PA-C as Consulting Physician (Physician Assistant) Danella Sensing, MD as Consulting Physician (Dermatology) Everett Graff, MD as Consulting Physician (Obstetrics and Gynecology) Vicie Mutters, MD as Consulting Physician (Otolaryngology) Marshell Garfinkel, MD as Consulting Physician (Pulmonary Disease) Gayla Medicus, MD as Referring Physician (Neurology)  Extended Emergency Contact Information Primary Emergency Contact: Horsley,[name] Address: [address], [place] Phone: [phone] Work Phone: [phone] Relation: Spouse  Code Status:  Full Code  Goals of care: Advanced Directive information Advanced Directives 03/28/2019  Does Patient Have a Medical Advance Directive? Yes  Type of Paramedic of [place];Living will  Does patient want to make changes to medical advance directive? No - Patient declined  Copy of [place] in Chart? No - copy requested  Would patient like information on creating a medical advance directive? -     Chief Complaint  Patient presents with  . Acute Visit    want referral for post nasal drip and cough    HPI:  Pt is a 74 y.o.  female seen today for an acute visit for evaluation of post nasal drip and cough.I'm seeing her for the first time usually follows up with PCP Dani Gobble here at [facility] office.she states PND and cough are on going symptoms x 2 years but has worsen.she clears her throat frequently,tends to choke on drinking or eating and spits out a lot of flame mucous. She states unable to go out anywhere because cough and spitting out is so embarrassing.sometimes has a  runny nose but not much drainage like post nasal drip.Also states her voice has changed over the years to be deep.used to sing solo.she thinks too her muscular dystrophy could be contributing to some of the problems swallowing and choking.   she has tried Human resources officer, Chief Financial Officer and Nasal spray but none of them has been effective.  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 She states GI recommended ENT referral for PND evaluation.No records from Gastroenterologist for evaluation.  She states would like Dr.Reed or [place] NP to recommended a real good ENT who can listen.she does not want to go to previous ENT specialist that she has gone in the past on [address]Discussed with Dr.Reed here at the office patient's request and she recommended Dr.Wolicki Ileene Hutchinson at [facility] on [address].Patient notified and agrees for referral to be send to recommended ENT specialist.        Past Medical History:  Diagnosis Date  .  Bronchitis   . 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 Chronic migraine without aura, intractable, without status migrainosus   . Diverticulosis 2010  . Endometriosis   . Low sodium levels   . Malnutrition ([place])   . MD (muscular dystrophy) ([place])   . Meniere's disease, bilateral   . [facility]    [facility]  . Peripheral neuropathy    Dr Erling Cruz  . Pneumonia   . Sensorineural hearing loss, bilateral   . Vertigo 2019   Past Surgical History:  Procedure Laterality Date  . ABDOMINAL HYSTERECTOMY  1980 or 1981   for Endometriosis  . 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 BREAST ENHANCEMENT SURGERY Bilateral 1979  . COLONOSCOPY  01/2012   Tics; Dr Teena Irani  . ESI      X 3 in 2003 & 02/17/2011 for L4-S1 symptoms  . LUMBAR LAMINECTOMY  12/2012   [place]S , [place]  . TONSILLECTOMY  1950 or 1951  . TUBAL LIGATION    . vocal cords stripped  1970    Allergies  Allergen Reactions  . Biaxin [Clarithromycin] Anaphylaxis  . Sulfonamide Derivatives Itching and Rash  . Tequin [Gatifloxacin] Anaphylaxis  . Demerol Nausea And Vomiting  . Lactose Intolerance (Gi) Other (See Comments)    Reaction:  Abdominal pain, gas, bloating  . Nsaids Other (See Comments)    Reaction:  GI upset/heartburn   . Risedronate Sodium Nausea And Vomiting and Other (See Comments)    Reaction:  GI upset/heartburn   . Rofecoxib Nausea And Vomiting and Other (See Comments)    Reaction:  GI upset/heartburn   . Alendronate Sodium Nausea And Vomiting  . Aspirin Other (See Comments)    Reaction:  Abdominal pain   . Fish Allergy Nausea And Vomiting  . Benadryl [Diphenhydramine Hcl] Palpitations    Outpatient Encounter Medications as of 04/17/2019  Medication Sig  . atenolol (TENORMIN) 25 MG tablet Take 25 mg by mouth as needed (for severe tachycardia lasting more then 20 minutes).  . baclofen (LIORESAL) 10 MG tablet Take 10 mg by mouth at bedtime.   . Biotin 5000 MCG TABS Take 5,000 mcg by mouth daily.   . butalbital-acetaminophen-caffeine (FIORICET, ESGIC) 50-325-40 MG tablet Take 1 tablet by mouth every 6 (six) hours as needed (for severe pain).   . CALCIUM PO Take 400 mg by mouth 3 (three) times daily.   . Carboxymethylcellulose Sodium (THERATEARS) 0.25 % SOLN Apply 1 drop to eye 3 (three)  times daily.  . Cholecalciferol (VITAMIN D3) LIQD 1 drop by Does not apply route 3 (three) times a week.  . doxycycline (PERIOSTAT) 20 MG tablet Take 20 mg by mouth 2 (two) times daily.  . Evening Primrose Oil 1000 MG CAPS Take 1,000 mg by mouth every other day.   Marland Kitchen FEMRING 0.05 MG/24HR RING   . gabapentin (NEURONTIN) 100 MG capsule Take 200 mg by mouth at bedtime.   Marland Kitchen lactase (LACTAID) 3000 units tablet Take 3,000 Units by mouth as needed (when eating dairy products).   . Lutein 20 MG TABS Take 20 mg by mouth daily.   Marland Kitchen MAGNESIUM CITRATE PO Take 150 mg by mouth 2 (two) times daily.  . methocarbamol (ROBAXIN) 750 MG tablet Take 750 mg by mouth every 6 (six) hours as needed for muscle spasms.   . Multiple Vitamin (MULTIVITAMIN WITH MINERALS) TABS tablet Take 1 tablet by mouth daily.  . Multiple Vitamins-Minerals (PRESERVISION AREDS  2) CAPS Take 1 capsule by mouth 2 (two) times daily.  . Omega-3 1000 MG CAPS Take 1 capsule by mouth 2 (two) times daily.  . ondansetron (ZOFRAN-ODT) 4 MG disintegrating tablet TAKE 1 TABLET BY MOUTH EVERY 6 HOURS AS NEEDED FOR NAUSEA/VOMIT  . rizatriptan (MAXALT-MLT) 10 MG disintegrating tablet Take 10 mg by mouth as needed for migraine.   . vitamin E 180 MG (400 UNITS) capsule Take by mouth.  . [DISCONTINUED] lidocaine (LIDODERM) 5 % Place 1 patch onto the skin daily. Remove & Discard patch within 12 hours or as directed by MD (Patient not taking: Reported on 03/28/2019)  . [DISCONTINUED] promethazine (PHENERGAN) 25 MG tablet Take 0.5-1 tablets (12.5-25 mg total) by mouth every 6 (six) hours as needed for nausea or vomiting.   No facility-administered encounter medications on file as of 04/17/2019.    Review of Systems  HENT: Positive for postnasal drip, trouble swallowing and voice change. Negative for congestion, sinus pressure, sinus pain, sneezing and sore throat.        Chokes easily ,clears throat frequant Voice has deepen thinks due to Muscular dystrophy    Eyes: Negative for discharge, redness and itching.  Respiratory: Positive for cough. Negative for chest tightness, shortness of breath and wheezing.   Gastrointestinal: Positive for nausea. Negative for abdominal distention, abdominal pain, constipation, diarrhea and vomiting.  Neurological: Negative for dizziness, speech difficulty, light-headedness and headaches.    Immunization History  Administered Date(s) Administered  . Fluad Quad(high Dose 65+) 02/18/2019  . Influenza Whole 01/27/2008  . Influenza, High Dose Seasonal PF 12/05/2013, 01/25/2018  . Pneumococcal Conjugate-13 11/19/2013  . Pneumococcal Polysaccharide-23 02/14/2018  . Td 01/03/2005  . Tdap 01/03/2017  . Zoster 02/23/2009   Pertinent  Health Maintenance Due  Topic Date Due  . COLONOSCOPY  11/20/2019  . INFLUENZA VACCINE  Completed  . DEXA SCAN  Completed  . PNA vac Low Risk Adult  Completed  . MAMMOGRAM  Discontinued   Fall Risk  04/17/2019 03/28/2019 12/16/2018 05/08/2018 03/19/2018  Falls in the past year? 0 1 1 0 0  Number falls in past yr: 0 0 0 0 0  Injury with Fall? 0 1 1 - 0  Comment - - - - -  Risk for fall due to : - Impaired balance/gait - - -   Functional Status Survey:    There were no vitals filed for this visit. There is no height or weight on file to calculate BMI. Physical Exam  Unable to complete on Telephone visit.   Labs reviewed: Recent Labs    05/08/18 1242 08/02/18 1621  NA 133* 131*  K 4.3 4.3  CL 96* 94*  CO2 32 27  GLUCOSE 95 97  BUN 10 15  CREATININE 0.63 0.55*  CALCIUM 9.2 9.5   Recent Labs    05/08/18 1242  AST 28  ALT 24  BILITOT 0.5  PROT 6.4   Recent Labs    05/08/18 1242 08/02/18 1621  WBC 4.4 5.2  NEUTROABS 3,414 3,978  HGB 13.3 13.5  HCT 39.2 40.9  MCV 90.3 89.7  PLT 236 254   Lab Results  Component Value Date   TSH 2.762 10/15/2014   Lab Results  Component Value Date   HGBA1C 5.6 03/03/2011   Lab Results  Component Value Date   CHOL  192 03/19/2018   HDL 88 03/19/2018   LDLCALC 91 03/19/2018   LDLDIRECT 124.7 08/20/2012   TRIG 52 03/19/2018   CHOLHDL 2.2 03/19/2018  Significant Diagnostic Results in last 30 days:  No results found.  Assessment/Plan 1. Post-nasal drip Ongoing worsening symptoms as above.Does not want to try any nasal spray or allergy medication request referral to ENT that Dr.Reed recommends.Agrees to referral to Dr.Wolicki Ileene Hutchinson at [facility] on [address].  - Ambulatory referral to ENT  2. Cough Coughing up flame due to post nasal drip. - Ambulatory referral to ENT as above.   3. Dysphagia, unspecified type Choking and cough with drinking water or eating food.Her Muscular dystrophy could be a contributory factor. - Ambulatory referral to ENT  Family/ staff Communication: Reviewed plan of care with patient and Dr.Reed.   Labs/tests ordered: None   Next Appointment: Has upcoming appointment on 03/30/2019 for AWV with PCP   Spent 18 minutes of non-face to face with patient   Sandrea Hughs, NP

## 2019-04-21 DIAGNOSIS — K317 Polyp of stomach and duodenum: Secondary | ICD-10-CM | POA: Diagnosis not present

## 2019-04-21 DIAGNOSIS — K2930 Chronic superficial gastritis without bleeding: Secondary | ICD-10-CM | POA: Diagnosis not present

## 2019-04-21 DIAGNOSIS — K219 Gastro-esophageal reflux disease without esophagitis: Secondary | ICD-10-CM | POA: Diagnosis not present

## 2019-04-24 ENCOUNTER — Encounter: Payer: Self-pay | Admitting: *Deleted

## 2019-04-28 DIAGNOSIS — H903 Sensorineural hearing loss, bilateral: Secondary | ICD-10-CM | POA: Diagnosis not present

## 2019-04-28 DIAGNOSIS — R0982 Postnasal drip: Secondary | ICD-10-CM | POA: Diagnosis not present

## 2019-04-28 DIAGNOSIS — R1310 Dysphagia, unspecified: Secondary | ICD-10-CM | POA: Diagnosis not present

## 2019-04-28 DIAGNOSIS — R42 Dizziness and giddiness: Secondary | ICD-10-CM | POA: Diagnosis not present

## 2019-04-28 DIAGNOSIS — R05 Cough: Secondary | ICD-10-CM | POA: Diagnosis not present

## 2019-04-29 DIAGNOSIS — H903 Sensorineural hearing loss, bilateral: Secondary | ICD-10-CM | POA: Diagnosis not present

## 2019-04-29 DIAGNOSIS — R05 Cough: Secondary | ICD-10-CM | POA: Diagnosis not present

## 2019-04-29 DIAGNOSIS — R0982 Postnasal drip: Secondary | ICD-10-CM | POA: Diagnosis not present

## 2019-04-29 DIAGNOSIS — J329 Chronic sinusitis, unspecified: Secondary | ICD-10-CM | POA: Diagnosis not present

## 2019-04-29 DIAGNOSIS — K219 Gastro-esophageal reflux disease without esophagitis: Secondary | ICD-10-CM | POA: Diagnosis not present

## 2019-04-29 DIAGNOSIS — R42 Dizziness and giddiness: Secondary | ICD-10-CM | POA: Diagnosis not present

## 2019-04-30 DIAGNOSIS — G7100 Muscular dystrophy, unspecified: Secondary | ICD-10-CM | POA: Diagnosis not present

## 2019-04-30 DIAGNOSIS — R42 Dizziness and giddiness: Secondary | ICD-10-CM | POA: Diagnosis not present

## 2019-04-30 DIAGNOSIS — H9192 Unspecified hearing loss, left ear: Secondary | ICD-10-CM | POA: Diagnosis not present

## 2019-04-30 DIAGNOSIS — G43111 Migraine with aura, intractable, with status migrainosus: Secondary | ICD-10-CM | POA: Diagnosis not present

## 2019-05-05 DIAGNOSIS — I6781 Acute cerebrovascular insufficiency: Secondary | ICD-10-CM | POA: Diagnosis not present

## 2019-05-05 DIAGNOSIS — R42 Dizziness and giddiness: Secondary | ICD-10-CM | POA: Diagnosis not present

## 2019-05-05 DIAGNOSIS — H9192 Unspecified hearing loss, left ear: Secondary | ICD-10-CM | POA: Diagnosis not present

## 2019-05-09 ENCOUNTER — Other Ambulatory Visit: Payer: Self-pay

## 2019-05-09 ENCOUNTER — Encounter: Payer: Self-pay | Admitting: Nurse Practitioner

## 2019-05-09 ENCOUNTER — Ambulatory Visit (INDEPENDENT_AMBULATORY_CARE_PROVIDER_SITE_OTHER): Payer: Medicare Other | Admitting: Nurse Practitioner

## 2019-05-09 DIAGNOSIS — R42 Dizziness and giddiness: Secondary | ICD-10-CM | POA: Diagnosis not present

## 2019-05-09 DIAGNOSIS — H9192 Unspecified hearing loss, left ear: Secondary | ICD-10-CM

## 2019-05-09 NOTE — Progress Notes (Signed)
This service is provided via telemedicine  No vital signs collected/recorded due to the encounter was a telemedicine visit.   Location of patient (ex: home, work):  Home  Patient consents to a telephone visit:  Yes  Location of the provider (ex: office, home):  [employer], Office   Name of any referring provider:  N/a  Names of all persons participating in the telemedicine service and their role in the encounter:  S.Chrae B/CMA, Sherrie Mustache, NP, and Patient   Time spent on call:  5 min with medical assistant       Careteam: Patient Care Team: Lauree Chandler, NP as PCP - General (Geriatric Medicine) Deliah Goody, PA-C as Consulting Physician (Physician Assistant) Danella Sensing, MD as Consulting Physician (Dermatology) Everett Graff, MD as Consulting Physician (Obstetrics and Gynecology) Vicie Mutters, MD as Consulting Physician (Otolaryngology) Marshell Garfinkel, MD as Consulting Physician (Pulmonary Disease) Gayla Medicus, MD as Referring Physician (Neurology)  PLACE OF SERVICE:  [place]  Advanced Directive information    Allergies  Allergen Reactions  . Biaxin [Clarithromycin] Anaphylaxis  . Sulfonamide Derivatives Itching and Rash  . Tequin [Gatifloxacin] Anaphylaxis  . Demerol Nausea And Vomiting  . Lactose Intolerance (Gi) Other (See Comments)    Reaction:  Abdominal pain, gas, bloating  . Nsaids Other (See Comments)    Reaction:  GI upset/heartburn   . Risedronate Sodium Nausea And Vomiting and Other (See Comments)    Reaction:  GI upset/heartburn   . Rofecoxib Nausea And Vomiting and Other (See Comments)    Reaction:  GI upset/heartburn   . Alendronate Sodium Nausea And Vomiting  . Aspirin Other (See Comments)    Reaction:  Abdominal pain   . Fish Allergy Nausea And Vomiting  . Benadryl [Diphenhydramine Hcl] Palpitations    Chief Complaint  Patient presents with  . Acute Visit    Discuss side effects of covid 19 vaccine,  patient concerned about getting 2nd vaccine. Telehealth.      HPI: Patient is a 74 y.o. female   Reports 2 days after the last COVID vaccine she had extreme dizziness and then improved. 3 days after that she noticed hearing loss- thought that it was her hearing aid (she has had problems with hearing aids in the past). Hearing loss got worse and then dizziness came back. This progressed and continued for about 1 week. Had flu like symptoms.  She notified audiologist  Dr [name] (ENT) already involved, CT head to evaluate sinuses was done.  She had a scheduled appt while she had dizziness and hearing loss and scheduled an auditory exam and CT scan- CT scan normal and allergy referral placed She had a different hearing test with ENT audiologist and hearing was the same to both ears. She is trying to get her old audiologist to send ENT records.  ENT concerned and recommended going to the ED for evaluation. She had an appt with neurologist the next day so opted not go to ED, he ordered MRI of auditory nerve which was normal.  Felt like she did get a virus and waiting for it to run its course.  Continues to be dizzy and off balance.   She is afraid to get the second dose of vaccine. It is due on 3/9  Review of Systems:  Review of Systems  Constitutional: Positive for malaise/fatigue. Negative for chills and fever.  Respiratory: Negative for shortness of breath.   Cardiovascular: Negative for chest pain and leg swelling.  Gastrointestinal:  Negative for abdominal pain, constipation, diarrhea and heartburn.  Musculoskeletal: Positive for myalgias.  Neurological: Positive for dizziness and weakness. Negative for headaches.  Psychiatric/Behavioral: Negative for depression and memory loss.   Past Medical History:  Diagnosis Date  . Bronchitis   . 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 Chronic migraine without aura, intractable, without status migrainosus   . Diverticulosis 2010  . Endometriosis   .  Low sodium levels   . Malnutrition ([place])   . MD (muscular dystrophy) ([place])   . Meniere's disease, bilateral   . [facility]  . Peripheral neuropathy    Dr Erling Cruz  . Pneumonia   . Sensorineural hearing loss, bilateral   . Vertigo 2019   Past Surgical History:  Procedure Laterality Date  . ABDOMINAL HYSTERECTOMY  1980 or 1981   for Endometriosis  . 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 BREAST ENHANCEMENT SURGERY Bilateral 1979  . COLONOSCOPY  01/2012   Tics; Dr Teena Irani  . ESI      X 3 in 2003 & 02/17/2011 for L4-S1 symptoms  . LUMBAR LAMINECTOMY  12/2012   W-S , [place]  . TONSILLECTOMY  1950 or 1951  . TUBAL LIGATION    . vocal cords stripped  1970   Social History:   reports that she has never smoked. She has never used smokeless tobacco. She reports previous alcohol use of about 2.0 standard drinks of alcohol per week. She reports that she does not use drugs.  Family History  Problem Relation Age of Onset  . Alcohol abuse Mother   . Hypertension Mother   . Diabetes Mother 35  . Stroke Mother 7  . Dementia Mother   . Kidney failure Mother   . Osteoporosis Mother   . Alzheimer's disease Father 92  . Cancer Maternal Aunt 80       pancreatic  . Arthritis Sister 22       knee replacement  . Hyperlipidemia Sister   . Lung disease Maternal Grandmother   . Lung disease Maternal Grandfather   . Hyperlipidemia Sister 83  . Other Son        recent back injury  . Other Daughter        recent back injury; knee issues when playing tennis  . Heart disease Neg Hx   . Heart attack Neg Hx     Medications: Patient's Medications  New Prescriptions   No medications on file  Previous Medications   ATENOLOL (TENORMIN) 25 MG TABLET    Take 25 mg by mouth as needed (for severe tachycardia lasting more then 20 minutes).   BACLOFEN (LIORESAL) 10 MG TABLET    Take 10 mg by mouth at bedtime.    BIOTIN 5000 MCG TABS    Take 5,000 mcg  by mouth daily.    BUTALBITAL-ACETAMINOPHEN-CAFFEINE (FIORICET, ESGIC) 50-325-40 MG TABLET    Take 1 tablet by mouth every 6 (six) hours as needed (for severe pain).    CALCIUM PO    Take 400 mg by mouth 3 (three) times daily.    CARBOXYMETHYLCELLULOSE SODIUM (THERATEARS) 0.25 % SOLN    Apply 1 drop to eye 3 (three) times daily.   CHOLECALCIFEROL (VITAMIN D3) LIQD    1 drop by Does not apply route 3 (three) times a week.   DOXYCYCLINE (PERIOSTAT) 20 MG TABLET    Take 20 mg by mouth daily.  EVENING PRIMROSE OIL 1000 MG CAPS    Take 1,000 mg by mouth every other day.    FEMRING 0.05 MG/24HR RING       GABAPENTIN (NEURONTIN) 100 MG CAPSULE    Take 200 mg by mouth at bedtime.    LACTASE (LACTAID) 3000 UNITS TABLET    Take 3,000 Units by mouth as needed (when eating dairy products).    LUTEIN 20 MG TABS    Take 20 mg by mouth daily.    MAGNESIUM CITRATE PO    Take 150 mg by mouth 2 (two) times daily.   METHOCARBAMOL (ROBAXIN) 750 MG TABLET    Take 750 mg by mouth every 6 (six) hours as needed for muscle spasms.    MULTIPLE VITAMIN (MULTIVITAMIN WITH MINERALS) TABS TABLET    Take 1 tablet by mouth daily.   MULTIPLE VITAMINS-MINERALS (PRESERVISION AREDS 2) CAPS    Take 1 capsule by mouth 2 (two) times daily.   OMEGA-3 1000 MG CAPS    Take 1 capsule by mouth 2 (two) times daily.   ONDANSETRON (ZOFRAN-ODT) 4 MG DISINTEGRATING TABLET    TAKE 1 TABLET BY MOUTH EVERY 6 HOURS AS NEEDED FOR NAUSEA/VOMIT   RIZATRIPTAN (MAXALT-MLT) 10 MG DISINTEGRATING TABLET    Take 10 mg by mouth as needed for migraine.    VITAMIN E 180 MG (400 UNITS) CAPSULE    Take by mouth.  Modified Medications   No medications on file  Discontinued Medications   No medications on file    Physical Exam:  There were no vitals filed for this visit. There is no height or weight on file to calculate BMI. Wt Readings from Last 3 Encounters:  12/13/18 105 lb (47.6 kg)  08/02/18 106 lb (48.1 kg)  05/08/18 109 lb (49.4 kg)       Labs reviewed: Basic Metabolic Panel: Recent Labs    08/02/18 1621  NA 131*  K 4.3  CL 94*  CO2 27  GLUCOSE 97  BUN 15  CREATININE 0.55*  CALCIUM 9.5   Liver Function Tests: No results for input(s): AST, ALT, ALKPHOS, BILITOT, PROT, ALBUMIN in the last 8760 hours. No results for input(s): LIPASE, AMYLASE in the last 8760 hours. No results for input(s): AMMONIA in the last 8760 hours. CBC: Recent Labs    08/02/18 1621  WBC 5.2  NEUTROABS 3,978  HGB 13.5  HCT 40.9  MCV 89.7  PLT 254   Lipid Panel: No results for input(s): CHOL, HDL, LDLCALC, TRIG, CHOLHDL, LDLDIRECT in the last 8760 hours. TSH: No results for input(s): TSH in the last 8760 hours. A1C: Lab Results  Component Value Date   HGBA1C 5.6 03/03/2011     Assessment/Plan 1. Acute hearing loss of left ear 2. Dizziness -under ENT and neurology, felt to be viral in nature and has made her feel very unwell. At this time she will hold off on getting her 2nd COVID vaccine. Consult with Dr Mariea Clonts per pts request and all in agreement. Pt plans to reach out to health department who is administering her vaccine. She got the pfizer vaccine and see how long she can wait before getting the 2nd dose with good efficacy.   Carlos American. Harle Battiest  [facility] [phone]    Virtual Visit via Telephone Note  I connected with@ on 05/09/19 at  3:45 PM EST by telephone and verified that I am speaking with the correct person using two identifiers.  Location: Patient: home Provider:  office    I discussed the limitations, risks, security and privacy concerns of performing an evaluation and management service by telephone and the availability of in person appointments. I also discussed with the patient that there may be a patient responsible charge related to this service. The patient expressed understanding and agreed to proceed.   I discussed the assessment and treatment plan with the  patient. The patient was provided an opportunity to ask questions and all were answered. The patient agreed with the plan and demonstrated an understanding of the instructions.   The patient was advised to call back or seek an in-person evaluation if the symptoms worsen or if the condition fails to improve as anticipated.  I provided 15 minutes of non-face-to-face time during this encounter.  Carlos American. Harle Battiest Avs printed and mailed

## 2019-06-18 DIAGNOSIS — R05 Cough: Secondary | ICD-10-CM | POA: Diagnosis not present

## 2019-09-14 ENCOUNTER — Emergency Department (HOSPITAL_BASED_OUTPATIENT_CLINIC_OR_DEPARTMENT_OTHER)
Admission: EM | Admit: 2019-09-14 | Discharge: 2019-09-14 | Disposition: A | Discharge status: Home / Self Care | Payer: Medicare Other | Attending: Emergency Medicine | Admitting: Emergency Medicine

## 2019-09-14 ENCOUNTER — Other Ambulatory Visit: Payer: Self-pay

## 2019-09-14 ENCOUNTER — Encounter (HOSPITAL_BASED_OUTPATIENT_CLINIC_OR_DEPARTMENT_OTHER): Payer: Self-pay

## 2019-09-14 DIAGNOSIS — C4491 Basal cell carcinoma of skin, unspecified: Secondary | ICD-10-CM | POA: Diagnosis not present

## 2019-09-14 DIAGNOSIS — R42 Dizziness and giddiness: Secondary | ICD-10-CM | POA: Diagnosis not present

## 2019-09-14 LAB — BASIC METABOLIC PANEL
Anion gap: 10 (ref 5–15)
BUN: 17 mg/dL (ref 8–23)
CO2: 28 mmol/L (ref 22–32)
Calcium: 9.4 mg/dL (ref 8.9–10.3)
Chloride: 91 mmol/L — ABNORMAL LOW (ref 98–111)
Creatinine, Ser: 0.57 mg/dL (ref 0.44–1.00)
GFR calc Af Amer: >60 mL/min (ref >60)
GFR calc non Af Amer: >60 mL/min (ref >60)
Glucose, Bld: 119 mg/dL — ABNORMAL HIGH (ref 70–99)
Potassium: 3.9 mmol/L (ref 3.5–5.1)
Sodium: 129 mmol/L — ABNORMAL LOW (ref 135–145)

## 2019-09-14 LAB — CBC
HCT: 43.4 % (ref 36.0–46.0)
Hemoglobin: 14.6 g/dL (ref 12.0–15.0)
MCH: 30.7 pg (ref 26.0–34.0)
MCHC: 33.6 g/dL (ref 30.0–36.0)
MCV: 91.4 fL (ref 80.0–100.0)
Platelets: 256 10*3/uL (ref 150–400)
RBC: 4.75 MIL/uL (ref 3.87–5.11)
RDW: 13.4 % (ref 11.5–15.5)
WBC: 4.1 10*3/uL (ref 4.0–10.5)
nRBC: 0 % (ref 0.0–0.2)

## 2019-09-14 MED ORDER — MECLIZINE HCL 12.5 MG PO TABS: 12.5000 mg | ORAL_TABLET | Freq: Three times a day (TID) | ORAL | 0 refills | Status: DC | PRN

## 2019-09-14 MED ORDER — SODIUM CHLORIDE 1 G PO TABS: 1.0000 g | ORAL_TABLET | Freq: Three times a day (TID) | ORAL | 0 refills | Status: AC

## 2019-09-14 NOTE — ED Triage Notes (Signed)
Pt arrives to ED to have her husband evaluated when she developed dizziness in triage room. Pt waited about 5 minutes and decided to be checked in. Pt states history of vertigo, is not on any medication for vertigo.

## 2019-09-14 NOTE — ED Provider Notes (Signed)
[facility] Emergency Department Provider Note MRN:  [id number]  Arrival date & time: 09/14/19     Chief Complaint   Dizziness   History of Present Illness   Alicia [name] is a 74 y.o. year-old female with a history of Mnire's disease presenting to the ED with chief complaint of dizziness.  Patient explains that she has had chronic dizziness and ear issues for the past 6 to 8 months.  She came to the emergency department to bring her husband for evaluation for a hernia problem.  Upon standing and moving her head, she experienced sudden onset room spinning sensation, different from her chronic dizzy sensation.  She is experienced this spinning sensation before and was diagnosed with vertigo, has experienced vertigo multiple times in the past and this feels the same.  Denies any headache or vision change, no numbness or weakness to the arms or legs, no chest pain or shortness of breath, no abdominal pain.  Vertigo has resolved.  Review of Systems  A complete 10 system review of systems was obtained and all systems are negative except as noted in the HPI and PMH.   Patient's Health History    Past Medical History:  Diagnosis Date  . Bronchitis   . 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 Chronic migraine without aura, intractable, without status migrainosus   . Diverticulosis 2010  . Endometriosis   . Low sodium levels   . Malnutrition ([place])   . 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 Meniere's disease, bilateral   . [facility]    [facility]  . Peripheral neuropathy    Dr Erling Cruz  . Pneumonia   . Sensorineural hearing loss, bilateral   . Vertigo 2019    Past Surgical History:  Procedure Laterality Date  . ABDOMINAL HYSTERECTOMY  1980 or 1981   for Endometriosis  . 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 BREAST ENHANCEMENT SURGERY Bilateral 1979  . COLONOSCOPY  01/2012   Tics; Dr Teena Irani  . ESI      X 3 in 2003 &  02/17/2011 for L4-S1 symptoms  . LUMBAR LAMINECTOMY  12/2012   [facility] , [place]  . TONSILLECTOMY  1950 or 1951  . TUBAL LIGATION    . vocal cords stripped  1970    Family History  Problem Relation Age of Onset  . Alcohol abuse Mother   . Hypertension Mother   . Diabetes Mother 43  . Stroke Mother 80  . Dementia Mother   . Kidney failure Mother   . Osteoporosis Mother   . Alzheimer's disease Father 16  . Cancer Maternal Aunt 80       pancreatic  . Arthritis Sister 61       knee replacement  . Hyperlipidemia Sister   . Lung disease Maternal Grandmother   . Lung disease Maternal Grandfather   . Hyperlipidemia Sister 74  . Other Son        recent back injury  . Other Daughter        recent back injury; knee issues when playing tennis  . Heart disease Neg Hx   . Heart attack Neg Hx     Social History   Socioeconomic History  . Marital status: Married    Spouse name: Not on file  . Number of children: Not on file  . Years of education: Not on file  . Highest education level: Not on  file  Occupational History  . Not on file  Tobacco Use  . Smoking status: Never Smoker  . Smokeless tobacco: Never Used  Vaping Use  . Vaping Use: Never used  Substance and Sexual Activity  . Alcohol use: Never    Alcohol/week: 2.0 standard drinks    Types: 2 Glasses of wine per week  . Drug use: No  . Sexual activity: Not Currently  Other Topics Concern  . Not on file  Social History Narrative   Social History      Diet? Trying to gain weight      Do you drink/eat things with caffeine? 1 mug hot tea with breakfast per day      Marital status?  Married 64 years                                  What year were you married? 1970      Do you live in a house, apartment, assisted living, condo, trailer, etc.? House (46 years)      Is it one or more stories? 2 stories and walk up finished attic      How many persons live in your home? 2      Do you have any pets in your home? (please list)  none      Highest level of education completed? 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     yes                                 Type & how often? Walk in neighborhood 4 - 6 times per week if possible      Advanced Directives (Lawyer just beginning to work on these things)      Do you have a living will?      Do you have a DNR form?                    no              If not, do you want to discuss one?      Do you have signed POA/HPOA for forms?       Functional Status      Do you have difficulty bathing or dressing yourself? no      Do you have difficulty preparing food or eating? no      Do you have difficulty managing your medications? no      Do you have difficulty managing your finances? no      Do you have difficulty affording your medications? no   Social Determinants of Health   Financial Resource Strain:   . Difficulty of Paying Living Expenses:   Food Insecurity:   . Worried About Charity fundraiser in the Last Year:   . Arboriculturist in the Last Year:   Transportation Needs:   . Film/video editor (Medical):   Marland Kitchen Lack of Transportation (Non-Medical):   Physical Activity:   . Days of Exercise per Week:   . Minutes of Exercise per Session:   Stress:   . Feeling of Stress :   Social Connections:   . Frequency of Communication with Friends and  Family:   . Frequency of Social Gatherings with Friends and Family:   . Attends Religious Services:   . Active Member of Clubs or Organizations:   . Attends Archivist Meetings:   Marland Kitchen Marital Status:   Intimate Partner Violence:   . Fear of Current or Ex-Partner:   . Emotionally Abused:   Marland Kitchen Physically Abused:   . Sexually Abused:      Physical Exam   Vitals:   09/14/19 1711 09/14/19 1932  BP: (!) 165/87 (!) 151/102  Pulse: 80 77  Resp: 18 16  Temp: 98.4 F (36.9 C)   SpO2: 98% 100%      CONSTITUTIONAL: Well-appearing, NAD NEURO:  Alert and oriented x 3, no focal deficits EYES:  eyes equal and reactive ENT/NECK:  no LAD, no JVD CARDIO: Regular rate, well-perfused, normal S1 and S2 PULM:  CTAB no wheezing or rhonchi GI/GU:  normal bowel sounds, non-distended, non-tender MSK/SPINE:  No gross deformities, no edema SKIN:  no rash, atraumatic PSYCH:  Appropriate speech and behavior  *Additional and/or pertinent findings included in MDM below  Diagnostic and Interventional Summary    EKG Interpretation  Date/Time:  Sunday September 14 2019 17:18:27 EDT Ventricular Rate:  83 PR Interval:  140 QRS Duration: 94 QT Interval:  368 QTC Calculation: 432 R Axis:   83 Text Interpretation: Sinus rhythm with occasional Premature ventricular complexes Otherwise normal ECG Confirmed by Chrisa Hassan ([id number]) on 09/14/2019 7:48:55 PM      Labs Reviewed  BASIC METABOLIC PANEL - Abnormal; Notable for the following components:      Result Value   Sodium 129 (*)    Chloride 91 (*)    Glucose, Bld 119 (*)    All other components within normal limits  CBC    No orders to display    Medications - No data to display   Procedures  /  Critical Care Procedures  ED Course and Medical Decision Making  I have reviewed the triage vital signs, the nursing notes, and pertinent available records from the EMR.  Listed above are laboratory and imaging tests that I personally ordered, reviewed, and interpreted and then considered in my medical decision making (see below for details).      Consistent with peripheral vertigo, now resolved.  Normal vital signs, EKG is without ischemic features, labs overall reassuring, does have chronic hyponatremia that is mildly worsened.  Advised more salt at home.  Completely normal neurological exam, nothing to suggest a central process or stroke.  Patient has had multiple MRIs of the brain in the past to evaluate for this chronic dizziness, have been  normal.  Appropriate for discharge.    Orbin Mayeux M. Hercules Hasler, MD [facility] [email or web address]  Final Clinical Impressions(s) / ED Diagnoses     ICD-10-CM   1. Vertigo  R42     ED Discharge Orders         Ordered    sodium chloride 1 g tablet  3 times daily     Discontinue  Reprint     09/14/19 2011    meclizine (ANTIVERT) 12.5 MG tablet  3 times daily PRN     Discontinue  Reprint     07 /11/21 2011           Discharge Instructions Discussed with and Provided to Patient:     Discharge Instructions     You were evaluated in the Emergency Department and after careful evaluation, we did  not find any emergent condition requiring admission or further testing in the hospital.  Your exam/testing today was overall reassuring.  Please increase the salt in your diet as we discussed and use the sodium tablets scribed for the next 3 days.  Please follow-up with your primary care doctor to discuss your sodium levels.  Regarding your vertigo, you can use the meclizine medication as needed for symptoms.  Please return to the Emergency Department if you experience any worsening of your condition.  Thank you for allowing [place] to be a part of your care.       Maudie Flakes, MD 09/14/19 2014

## 2019-09-14 NOTE — ED Notes (Signed)
Pt states that her dizziness has gotten better.

## 2019-09-14 NOTE — Discharge Instructions (Addendum)
You were evaluated in the Emergency Department and after careful evaluation, we did not find any emergent condition requiring admission or further testing in the hospital.  Your exam/testing today was overall reassuring.  Please increase the salt in your diet as we discussed and use the sodium tablets scribed for the next 3 days.  Please follow-up with your primary care doctor to discuss your sodium levels.  Regarding your vertigo, you can use the meclizine medication as needed for symptoms.  Please return to the Emergency Department if you experience any worsening of your condition.  Thank you for allowing [place] to be a part of your care.

## 2019-10-06 DIAGNOSIS — D0462 Carcinoma in situ of skin of left upper limb, including shoulder: Secondary | ICD-10-CM | POA: Diagnosis not present

## 2019-10-06 DIAGNOSIS — L309 Dermatitis, unspecified: Secondary | ICD-10-CM | POA: Diagnosis not present

## 2019-10-06 DIAGNOSIS — Z85828 Personal history of other malignant neoplasm of skin: Secondary | ICD-10-CM | POA: Diagnosis not present

## 2019-10-06 DIAGNOSIS — L718 Other rosacea: Secondary | ICD-10-CM | POA: Diagnosis not present

## 2019-10-06 DIAGNOSIS — L821 Other seborrheic keratosis: Secondary | ICD-10-CM | POA: Diagnosis not present

## 2019-10-06 DIAGNOSIS — D485 Neoplasm of uncertain behavior of skin: Secondary | ICD-10-CM | POA: Diagnosis not present

## 2019-10-06 DIAGNOSIS — L565 Disseminated superficial actinic porokeratosis (DSAP): Secondary | ICD-10-CM | POA: Diagnosis not present

## 2019-11-13 DIAGNOSIS — H5203 Hypermetropia, bilateral: Secondary | ICD-10-CM | POA: Diagnosis not present

## 2019-11-13 DIAGNOSIS — H524 Presbyopia: Secondary | ICD-10-CM | POA: Diagnosis not present

## 2019-11-13 DIAGNOSIS — H52223 Regular astigmatism, bilateral: Secondary | ICD-10-CM | POA: Diagnosis not present

## 2019-11-13 DIAGNOSIS — L718 Other rosacea: Secondary | ICD-10-CM | POA: Diagnosis not present

## 2019-11-13 DIAGNOSIS — H04123 Dry eye syndrome of bilateral lacrimal glands: Secondary | ICD-10-CM | POA: Diagnosis not present

## 2019-11-13 DIAGNOSIS — H2513 Age-related nuclear cataract, bilateral: Secondary | ICD-10-CM | POA: Diagnosis not present

## 2019-11-13 DIAGNOSIS — H3530 Unspecified macular degeneration: Secondary | ICD-10-CM | POA: Diagnosis not present

## 2020-03-29 ENCOUNTER — Encounter: Payer: Self-pay | Admitting: Nurse Practitioner

## 2020-03-29 ENCOUNTER — Other Ambulatory Visit: Payer: Self-pay

## 2020-03-29 ENCOUNTER — Ambulatory Visit (INDEPENDENT_AMBULATORY_CARE_PROVIDER_SITE_OTHER): Payer: Medicare Other | Admitting: Nurse Practitioner

## 2020-03-29 ENCOUNTER — Telehealth: Payer: Self-pay

## 2020-03-29 DIAGNOSIS — Z0000 Encounter for general adult medical examination without abnormal findings: Secondary | ICD-10-CM | POA: Diagnosis not present

## 2020-03-29 NOTE — Progress Notes (Signed)
Subjective:   Alicia [name] is a 75 y.o. female who presents for Medicare Annual (Subsequent) preventive examination.  Review of Systems     Cardiac Risk Factors include: sedentary lifestyle;advanced age (>81men, >5 women)     Objective:    Today's Vitals   03/29/20 1459  PainSc: 3    There is no height or weight on file to calculate BMI.  Advanced Directives 03/29/2020 09/14/2019 03/28/2019 12/13/2018 03/19/2018 02/14/2018 10/24/2015  Does Patient Have a Medical Advance Directive? Yes Yes Yes No No No No  Type of Paramedic of [place];Living will [place];Living will [place];Living will - - - -  Does patient want to make changes to medical advance directive? - No - Patient declined No - Patient declined - - - -  Copy of [place] in Chart? No - copy requested No - copy requested No - copy requested - - - -  Would patient like information on creating a medical advance directive? - - - No - Patient declined Yes (MAU/Ambulatory/Procedural Areas - Information given) - No - patient declined information    Current Medications (verified) Outpatient Encounter Medications as of 03/29/2020  Medication Sig  . atenolol (TENORMIN) 25 MG tablet Take 25 mg by mouth as needed (for severe tachycardia lasting more then 20 minutes).  . AZELAIC ACID EX Apply 1 application topically 2 (two) times daily.  . baclofen (LIORESAL) 10 MG tablet Take 10 mg by mouth at bedtime.  . Biotin 5000 MCG TABS Take 5,000 mcg by mouth daily.   . butalbital-acetaminophen-caffeine (FIORICET, ESGIC) 50-325-40 MG tablet Take 1 tablet by mouth every 6 (six) hours as needed (for severe pain).   . CALCIUM PO Take 400 mg by mouth 3 (three) times daily.   . Calcium-Magnesium-Vitamin D (CALCIUM MAGNESIUM PO) Take 250 mg by mouth as needed (For leg cramps).  . Carboxymethylcellulose Sodium 0.25 % SOLN Apply 1 drop to eye 3 (three) times daily.   . Cholecalciferol (VITAMIN D3) LIQD 1 drop by Does not apply route 3 (three) times a week.  . Evening Primrose Oil 1000 MG CAPS Take 1,000 mg by mouth daily at 12 noon.  Marland Kitchen FEMRING 0.05 MG/24HR RING Place vaginally every 3 (three) months.  . gabapentin (NEURONTIN) 100 MG capsule Take 200 mg by mouth at bedtime.   Marland Kitchen ipratropium (ATROVENT) 0.06 % nasal spray Place 2 sprays into both nostrils 2 (two) times daily as needed for rhinitis.  Marland Kitchen lactase (LACTAID) 3000 units tablet Take 3,000 Units by mouth as needed (when eating dairy products).   Marland Kitchen levocetirizine (XYZAL) 5 MG tablet Take 5 mg by mouth every evening.  . Lutein 20 MG TABS Take 20 mg by mouth daily.   Marland Kitchen MAGNESIUM CITRATE PO Take 150 mg by mouth 2 (two) times daily.  . meclizine (ANTIVERT) 12.5 MG tablet Take 1 tablet (12.5 mg total) by mouth 3 (three) times daily as needed for dizziness.  . methocarbamol (ROBAXIN) 750 MG tablet Take 750 mg by mouth every 6 (six) hours as needed for muscle spasms.  . metroNIDAZOLE (METROGEL) 0.75 % gel Apply 1 application topically 2 (two) times daily. Apply to face for rosacea  . Multiple Vitamin (MULTIVITAMIN WITH MINERALS) TABS tablet Take 1 tablet by mouth daily.  . Multiple Vitamins-Minerals (PRESERVISION AREDS 2) CAPS Take 1 capsule by mouth 2 (two) times daily.  . Omega-3 1000 MG CAPS Take 1 capsule by mouth 2 (two) times daily.  Marland Kitchen  ondansetron (ZOFRAN-ODT) 4 MG disintegrating tablet TAKE 1 TABLET BY MOUTH EVERY 6 HOURS AS NEEDED FOR NAUSEA/VOMIT  . rizatriptan (MAXALT-MLT) 10 MG disintegrating tablet Take 10 mg by mouth as needed for migraine.   . [DISCONTINUED] doxycycline (PERIOSTAT) 20 MG tablet Take 20 mg by mouth daily.   . [DISCONTINUED] vitamin E 180 MG (400 UNITS) capsule Take by mouth. (Patient not taking: Reported on 03/29/2020)   No facility-administered encounter medications on file as of 03/29/2020.    Allergies (verified) Biaxin [clarithromycin], Sulfonamide derivatives, Tequin  [gatifloxacin], Demerol, Lactose intolerance (gi), Nsaids, Risedronate sodium, Rofecoxib, Alendronate sodium, Aspirin, Fish allergy, and Benadryl [diphenhydramine hcl]   History: Past Medical History:  Diagnosis Date  . Bronchitis   . 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 Chronic migraine without aura, intractable, without status migrainosus   . Diverticulosis 2010  . Endometriosis   . Low sodium levels   . Malnutrition ([place])   . 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 Meniere's disease, bilateral   . [facility]    [facility]  . Peripheral neuropathy    Dr Erling Cruz  . Pneumonia   . Sensorineural hearing loss, bilateral   . Vertigo 2019   Past Surgical History:  Procedure Laterality Date  . ABDOMINAL HYSTERECTOMY  1980 or 1981   for Endometriosis  . 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 BREAST ENHANCEMENT SURGERY Bilateral 1979  . COLONOSCOPY  01/2012   Tics; Dr Teena Irani  . ESI      X 3 in 2003 & 02/17/2011 for L4-S1 symptoms  . LUMBAR LAMINECTOMY  12/2012   W-S , [place]  . TONSILLECTOMY  1950 or 1951  . TUBAL LIGATION    . vocal cords stripped  1970   Family History  Problem Relation Age of Onset  . Alcohol abuse Mother   . Hypertension Mother   . Diabetes Mother 63  . Stroke Mother 41  . Dementia Mother   . Kidney failure Mother   . Osteoporosis Mother   . Alzheimer's disease Father 68  . Cancer Maternal Aunt 80       pancreatic  . Arthritis Sister 1       knee replacement  . Hyperlipidemia Sister   . Lung disease Maternal Grandmother   . Lung disease Maternal Grandfather   . Hyperlipidemia Sister 36  . Other Son        recent back injury  . Other Daughter        recent back injury; knee issues when playing tennis  . Heart disease Neg Hx   . Heart attack Neg Hx    Social History   Socioeconomic History  . Marital status: Married    Spouse name: Not on file  . Number of children: Not on file  . Years of education: Not on  file  . Highest education level: Not on file  Occupational History  . Not on file  Tobacco Use  . Smoking status: Never Smoker  . Smokeless tobacco: Never Used  Vaping Use  . Vaping Use: Never used  Substance and Sexual Activity  . Alcohol use: Never  . Drug use: No  . Sexual activity: Not Currently  Other Topics Concern  . Not on file  Social History Narrative   Social History      Diet? Trying to gain weight      Do you drink/eat things with caffeine? 1 mug hot tea  with breakfast per day      Marital status?  Married 87 years                                  What year were you married? 1970      Do you live in a house, apartment, assisted living, condo, trailer, etc.? House (46 years)      Is it one or more stories? 2 stories and walk up finished attic      How many persons live in your home? 2      Do you have any pets in your home? (please list) none      Highest level of education completed? 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     yes                                 Type & how often? Walk in neighborhood 4 - 6 times per week if possible      Advanced Directives (Lawyer just beginning to work on these things)      Do you have a living will?      Do you have a DNR form?                    no              If not, do you want to discuss one?      Do you have signed POA/HPOA for forms?       Functional Status      Do you have difficulty bathing or dressing yourself? no      Do you have difficulty preparing food or eating? no      Do you have difficulty managing your medications? no      Do you have difficulty managing your finances? no      Do you have difficulty affording your medications? no   Social Determinants of Health   Financial Resource Strain: Not on file  Food Insecurity: Not on file  Transportation Needs: Not on file  Physical  Activity: Not on file  Stress: Not on file  Social Connections: Not on file    Tobacco Counseling Counseling given: Not Answered   Clinical Intake:  Pre-visit preparation completed: Yes  Pain : 0-10 Pain Score: 3  Pain Type: Chronic pain Pain Location: Back Pain Orientation: Lower Pain Radiating Towards: hip Pain Descriptors / Indicators: Aching Pain Onset: More than a month ago Pain Frequency: Constant     BMI - recorded: 16 Nutritional Status: BMI <19  Underweight Diabetes: No  How often do you need to have someone help you when you read instructions, pamphlets, or other written materials from your doctor or pharmacy?: 1 - Never  Diabetic?no         Activities of Daily Living In your present state of health, do you have any difficulty performing the following activities: 03/29/2020  Hearing? Y  Vision? N  Difficulty concentrating or making decisions? N  Walking or climbing stairs? N  Dressing or bathing? N  Doing errands, shopping? Y  Preparing Food and eating ? N  Using the Toilet? N  In the past six months, have you  accidently leaked urine? Y  Do you have problems with loss of bowel control? N  Managing your Medications? N  Managing your Finances? N  Housekeeping or managing your Housekeeping? Y  Comment due to dizziness  Some recent data might be hidden    Patient Care Team: Lauree Chandler, NP as PCP - General (Geriatric Medicine) Deliah Goody, PA-C as Consulting Physician (Physician Assistant) Danella Sensing, MD as Consulting Physician (Dermatology) Everett Graff, MD as Consulting Physician (Obstetrics and Gynecology) Vicie Mutters, MD as Consulting Physician (Otolaryngology) Marshell Garfinkel, MD as Consulting Physician (Pulmonary Disease) Gayla Medicus, MD as Referring Physician (Neurology)  Indicate any recent Medical Services you may have received from other than [facility] providers in the past year (date may be approximate).     Assessment:    This is a routine wellness examination for [place].  Hearing/Vision screen  Hearing Screening   125Hz  250Hz  500Hz  1000Hz  2000Hz  3000Hz  4000Hz  6000Hz  8000Hz   Right ear:           Left ear:           Comments: Patient with significant hearing loss  Vision Screening Comments: Last eye exam less than 12 months ago. Dr.Sally [facility]. Patient was seen in December 2021  Dietary issues and exercise activities discussed: Current Exercise Habits: The patient does not participate in regular exercise at present  Goals    . Patient Stated     To get more exercise and fresh air.       Depression Screen PHQ 2/9 Scores 03/29/2020 03/28/2019 03/19/2018 02/14/2018 08/20/2012  PHQ - 2 Score 0 0 0 0 0    Fall Risk Fall Risk  03/29/2020 04/17/2019 03/28/2019 12/16/2018 05/08/2018  Falls in the past year? 0 0 1 1 0  Number falls in past yr: 0 0 0 0 0  Injury with Fall? 0 0 1 1 -  Comment - - - - -  Risk for fall due to : - - Impaired balance/gait - -    FALL RISK PREVENTION PERTAINING TO THE HOME:  Any stairs in or around the home? Yes  If so, are there any without handrails? Yes  Home free of loose throw rugs in walkways, pet beds, electrical cords, etc? Yes  Adequate lighting in your home to reduce risk of falls? Yes   ASSISTIVE DEVICES UTILIZED TO PREVENT FALLS:  Life alert? No  Use of a cane, walker or w/c? Yes  Grab bars in the bathroom? Yes  Shower chair or bench in shower? Yes  Elevated toilet seat or a handicapped toilet? Yes   TIMED UP AND GO:  Was the test performed? No .   Cognitive Function: MMSE - Mini Mental State Exam 03/19/2018  Orientation to time 5  Orientation to Place 5  Registration 3  Attention/ Calculation 5  Recall 3  Language- name 2 objects 2  Language- repeat 1  Language- follow 3 step command 3  Language- read & follow direction 1  Write a sentence 1  Copy design 1  Total score 30     6CIT Screen 03/29/2020 03/28/2019  What Year? 0 points 0 points   What month? 0 points 0 points  What time? 0 points 0 points  Count back from 20 0 points 0 points  Months in reverse 0 points 0 points  Repeat phrase 0 points 0 points  Total Score 0 0    Immunizations Immunization History  Administered Date(s) Administered  . Fluad Quad(high Dose 65+) 02/18/2019  .  Influenza Whole 01/27/2008  . Influenza, High Dose Seasonal PF 12/05/2013, 01/25/2018  . PFIZER(Purple Top)SARS-COV-2 Vaccination 04/18/2019  . Pneumococcal Conjugate-13 11/19/2013  . Pneumococcal Polysaccharide-23 02/14/2018  . Td 01/03/2005  . Tdap 01/03/2017  . Zoster 02/23/2009    TDAP status: Up to date  Flu Vaccine status: Due, Education has been provided regarding the importance of this vaccine. Advised may receive this vaccine at local pharmacy or Health Dept. Aware to provide a copy of the vaccination record if obtained from local pharmacy or Health Dept. Verbalized acceptance and understanding.  Pneumococcal vaccine status: Up to date  Covid-19 vaccine status: Information provided on how to obtain vaccines.   Qualifies for Shingles Vaccine? Yes   Zostavax completed Yes   Shingrix Completed?: No.    Education has been provided regarding the importance of this vaccine. Patient has been advised to call insurance company to determine out of pocket expense if they have not yet received this vaccine. Advised may also receive vaccine at local pharmacy or Health Dept. Verbalized acceptance and understanding.  Screening Tests Health Maintenance  Topic Date Due  . Hepatitis C Screening  Never done  . COVID-19 Vaccine (2 - Pfizer 3-dose series) 05/09/2019  . INFLUENZA VACCINE  10/05/2019  . COLONOSCOPY (Pts 45-19yrs Insurance coverage will need to be confirmed)  11/20/2019  . TETANUS/TDAP  01/04/2027  . DEXA SCAN  Completed  . PNA vac Low Risk Adult  Completed    Health Maintenance  Health Maintenance Due  Topic Date Due  . Hepatitis C Screening  Never done  . COVID-19  Vaccine (2 - Pfizer 3-dose series) 05/09/2019  . INFLUENZA VACCINE  10/05/2019  . COLONOSCOPY (Pts 45-37yrs Insurance coverage will need to be confirmed)  11/20/2019    Colorectal cancer screening: Referral to GI placed today. Pt aware the office will call re: appt.  Mammogram status: No longer required due to age.  Bone Density status: Completed 2020. Results reflect: Bone density results: OSTEOPENIA. Repeat every 2 years.  Lung Cancer Screening: (Low Dose CT Chest recommended if Age 19-80 years, 30 pack-year currently smoking OR have quit w/in 15years.) does not qualify.   Lung Cancer Screening Referral: na  Additional Screening:  Hepatitis C Screening: does qualify; Complete with next blood draw Vision Screening: Recommended annual ophthalmology exams for early detection of glaucoma and other disorders of the eye. Is the patient up to date with their annual eye exam?  Yes  Who is the provider or what is the name of the office in which the patient attends annual eye exams? [facility] If pt is not established with a provider, would they like to be referred to a provider to establish care? No .   Dental Screening: Recommended annual dental exams for proper oral hygiene  Community Resource Referral / Chronic Care Management: CRR required this visit?  No   CCM required this visit?  No      Plan:     I have personally reviewed and noted the following in the patient's chart:   . Medical and social history . Use of alcohol, tobacco or illicit drugs  . Current medications and supplements . Functional ability and status . Nutritional status . Physical activity . Advanced directives . List of other physicians . Hospitalizations, surgeries, and ER visits in previous 12 months . Vitals . Screenings to include cognitive, depression, and falls . Referrals and appointments  In addition, I have reviewed and discussed with patient certain preventive protocols, quality metrics, and best  practice recommendations. A written personalized care plan for preventive services as well as general preventive health recommendations were provided to patient.     Lauree Chandler, NP   03/29/2020     Virtual Visit via Telephone Note  I connected with@ on 03/29/20 at  2:15 PM EST by telephone and verified that I am speaking with the correct person using two identifiers.  Location: Patient: home Provider: home-remote   I discussed the limitations, risks, security and privacy concerns of performing an evaluation and management service by telephone and the availability of in person appointments. I also discussed with the patient that there may be a patient responsible charge related to this service. The patient expressed understanding and agreed to proceed.   I discussed the assessment and treatment plan with the patient. The patient was provided an opportunity to ask questions and all were answered. The patient agreed with the plan and demonstrated an understanding of the instructions.   The patient was advised to call back or seek an in-person evaluation if the symptoms worsen or if the condition fails to improve as anticipated.  I provided 20 minutes of non-face-to-face time during this encounter.  Carlos American. Harle Battiest Avs printed and mailed

## 2020-03-29 NOTE — Patient Instructions (Signed)
Alicia [name] , Thank you for taking time to come for your Medicare Wellness Visit. I appreciate your ongoing commitment to your health goals. Please review the following plan we discussed and let me know if I can assist you in the future.   Screening recommendations/referrals: Colonoscopy recommended- to schedule through [facility] aged out Bone Density up to date Recommended yearly ophthalmology/optometry visit for glaucoma screening and checkup Recommended yearly dental visit for hygiene and checkup  Vaccinations: Influenza vaccine RECOMMENDED- to get at local pharmacy or [facility] Pneumococcal vaccine up to date Tdap vaccine up to date Shingles vaccine RECOMMENDED- to get at your local pharmacy    Advanced directives: recommended to bring so we can place them on your chart   Conditions/risks identified: falls, advance age, progressive hearing loss.   Next appointment: 1 year    Preventive Care 47 Years and Older, Female Preventive care refers to lifestyle choices and visits with your health care provider that can promote health and wellness. What does preventive care include?  A yearly physical exam. This is also called an annual well check.  Dental exams once or twice a year.  Routine eye exams. Ask your health care provider how often you should have your eyes checked.  Personal lifestyle choices, including:  Daily care of your teeth and gums.  Regular physical activity.  Eating a healthy diet.  Avoiding tobacco and drug use.  Limiting alcohol use.  Practicing safe sex.  Taking low-dose aspirin every day.  Taking vitamin and mineral supplements as recommended by your health care provider. What happens during an annual well check? The services and screenings done by your health care provider during your annual well check will depend on your age, overall health, lifestyle risk factors, and family history of disease. Counseling  Your health care  provider may ask you questions about your:  Alcohol use.  Tobacco use.  Drug use.  Emotional well-being.  Home and relationship well-being.  Sexual activity.  Eating habits.  History of falls.  Memory and ability to understand (cognition).  Work and work Statistician.  Reproductive health. Screening  You may have the following tests or measurements:  Height, weight, and BMI.  Blood pressure.  Lipid and cholesterol levels. These may be checked every 5 years, or more frequently if you are over 69 years old.  Skin check.  Lung cancer screening. You may have this screening every year starting at age 79 if you have a 30-pack-year history of smoking and currently smoke or have quit within the past 15 years.  Fecal occult blood test (FOBT) of the stool. You may have this test every year starting at age 19.  Flexible sigmoidoscopy or colonoscopy. You may have a sigmoidoscopy every 5 years or a colonoscopy every 10 years starting at age 40.  Hepatitis C blood test.  Hepatitis B blood test.  Sexually transmitted disease (STD) testing.  Diabetes screening. This is done by checking your blood sugar (glucose) after you have not eaten for a while (fasting). You may have this done every 1-3 years.  Bone density scan. This is done to screen for osteoporosis. You may have this done starting at age 17.  Mammogram. This may be done every 1-2 years. Talk to your health care provider about how often you should have regular mammograms. Talk with your health care provider about your test results, treatment options, and if necessary, the need for more tests. Vaccines  Your health care provider may recommend certain  vaccines, such as:  Influenza vaccine. This is recommended every year.  Tetanus, diphtheria, and acellular pertussis (Tdap, Td) vaccine. You may need a Td booster every 10 years.  Zoster vaccine. You may need this after age 32.  Pneumococcal 13-valent conjugate (PCV13)  vaccine. One dose is recommended after age 54.  Pneumococcal polysaccharide (PPSV23) vaccine. One dose is recommended after age 17. Talk to your health care provider about which screenings and vaccines you need and how often you need them. This information is not intended to replace advice given to you by your health care provider. Make sure you discuss any questions you have with your health care provider. Document Released: 03/19/2015 Document Revised: 11/10/2015 Document Reviewed: 12/22/2014 Elsevier Interactive Patient Education  2017 [place] Prevention in the Home Falls can cause injuries. They can happen to people of all ages. There are many things you can do to make your home safe and to help prevent falls. What can I do on the outside of my home?  Regularly fix the edges of walkways and driveways and fix any cracks.  Remove anything that might make you trip as you walk through a door, such as a raised step or threshold.  Trim any bushes or trees on the path to your home.  Use bright outdoor lighting.  Clear any walking paths of anything that might make someone trip, such as rocks or tools.  Regularly check to see if handrails are loose or broken. Make sure that both sides of any steps have handrails.  Any raised decks and porches should have guardrails on the edges.  Have any leaves, snow, or ice cleared regularly.  Use sand or salt on walking paths during winter.  Clean up any spills in your garage right away. This includes oil or grease spills. What can I do in the bathroom?  Use night lights.  Install grab bars by the toilet and in the tub and shower. Do not use towel bars as grab bars.  Use non-skid mats or decals in the tub or shower.  If you need to sit down in the shower, use a plastic, non-slip stool.  Keep the floor dry. Clean up any water that spills on the floor as soon as it happens.  Remove soap buildup in the tub or shower  regularly.  Attach bath mats securely with double-sided non-slip rug tape.  Do not have throw rugs and other things on the floor that can make you trip. What can I do in the bedroom?  Use night lights.  Make sure that you have a light by your bed that is easy to reach.  Do not use any sheets or blankets that are too big for your bed. They should not hang down onto the floor.  Have a firm chair that has side arms. You can use this for support while you get dressed.  Do not have throw rugs and other things on the floor that can make you trip. What can I do in the kitchen?  Clean up any spills right away.  Avoid walking on wet floors.  Keep items that you use a lot in easy-to-reach places.  If you need to reach something above you, use a strong step stool that has a grab bar.  Keep electrical cords out of the way.  Do not use floor polish or wax that makes floors slippery. If you must use wax, use non-skid floor wax.  Do not have throw rugs and other things  on the floor that can make you trip. What can I do with my stairs?  Do not leave any items on the stairs.  Make sure that there are handrails on both sides of the stairs and use them. Fix handrails that are broken or loose. Make sure that handrails are as long as the stairways.  Check any carpeting to make sure that it is firmly attached to the stairs. Fix any carpet that is loose or worn.  Avoid having throw rugs at the top or bottom of the stairs. If you do have throw rugs, attach them to the floor with carpet tape.  Make sure that you have a light switch at the top of the stairs and the bottom of the stairs. If you do not have them, ask someone to add them for you. What else can I do to help prevent falls?  Wear shoes that:  Do not have high heels.  Have rubber bottoms.  Are comfortable and fit you well.  Are closed at the toe. Do not wear sandals.  If you use a stepladder:  Make sure that it is fully  opened. Do not climb a closed stepladder.  Make sure that both sides of the stepladder are locked into place.  Ask someone to hold it for you, if possible.  Clearly mark and make sure that you can see:  Any grab bars or handrails.  First and last steps.  Where the edge of each step is.  Use tools that help you move around (mobility aids) if they are needed. These include:  Canes.  Walkers.  Scooters.  Crutches.  Turn on the lights when you go into a dark area. Replace any light bulbs as soon as they burn out.  Set up your furniture so you have a clear path. Avoid moving your furniture around.  If any of your floors are uneven, fix them.  If there are any pets around you, be aware of where they are.  Review your medicines with your doctor. Some medicines can make you feel dizzy. This can increase your chance of falling. Ask your doctor what other things that you can do to help prevent falls. This information is not intended to replace advice given to you by your health care provider. Make sure you discuss any questions you have with your health care provider. Document Released: 12/17/2008 Document Revised: 07/29/2015 Document Reviewed: 03/27/2014 Elsevier Interactive Patient Education  2017 [employer].

## 2020-03-29 NOTE — Telephone Encounter (Signed)
Alicia [name], Alicia [name] are scheduled for a virtual visit with your provider today.    Just as we do with appointments in the office, we must obtain your consent to participate.  Your consent will be active for this visit and any virtual visit you may have with one of our providers in the next 365 days.    If you have a MyChart account, I can also send a copy of this consent to you electronically.  All virtual visits are billed to your insurance company just like a traditional visit in the office.  As this is a virtual visit, video technology does not allow for your provider to perform a traditional examination.  This may limit your provider's ability to fully assess your condition.  If your provider identifies any concerns that need to be evaluated in person or the need to arrange testing such as labs, EKG, etc, we will make arrangements to do so.    Although advances in technology are sophisticated, we cannot ensure that it will always work on either your end or our end.  If the connection with a video visit is poor, we may have to switch to a telephone visit.  With either a video or telephone visit, we are not always able to ensure that we have a secure connection.   I need to obtain your verbal consent now.   Are you willing to proceed with your visit today?   Alicia [name] has provided verbal consent on 03/29/2020 for a virtual visit (video or telephone).   [name] [place], [place] 03/29/2020  2:03 PM

## 2020-03-29 NOTE — Progress Notes (Signed)
   This service is provided via telemedicine  No vital signs collected/recorded due to the encounter was a telemedicine visit.   Location of patient (ex: home, work):  Home  Patient consents to a telephone visit: Yes, see telephone visit dated 03/29/20  Location of the provider (ex: office, home):  [facility], Office   Name of any referring provider:  N/A  Names of all persons participating in the telemedicine service and their role in the encounter:  S.Chrae B/CMA, Sherrie Mustache, NP, and Patient   Time spent on call:  20 min with medical assistant

## 2020-04-01 ENCOUNTER — Other Ambulatory Visit: Payer: Self-pay

## 2020-04-01 ENCOUNTER — Encounter: Payer: Self-pay | Admitting: Adult Health

## 2020-04-01 ENCOUNTER — Ambulatory Visit (INDEPENDENT_AMBULATORY_CARE_PROVIDER_SITE_OTHER): Payer: Medicare Other | Admitting: Adult Health

## 2020-04-01 VITALS — BP 120/80 | HR 62 | Temp 97.7°F | Resp 16 | Ht 66.0 in | Wt 106.8 lb

## 2020-04-01 DIAGNOSIS — E871 Hypo-osmolality and hyponatremia: Secondary | ICD-10-CM

## 2020-04-01 DIAGNOSIS — R42 Dizziness and giddiness: Secondary | ICD-10-CM

## 2020-04-01 DIAGNOSIS — R002 Palpitations: Secondary | ICD-10-CM

## 2020-04-01 DIAGNOSIS — R0602 Shortness of breath: Secondary | ICD-10-CM | POA: Diagnosis not present

## 2020-04-01 NOTE — Patient Instructions (Addendum)
   Palpitations  Palpitations are feelings that your heartbeat is not normal. Your heartbeat may feel like it is:  Uneven.  Faster than normal.  Fluttering.  Skipping a beat. This is usually not a serious problem. In some cases, you may need tests to rule out any serious problems. Follow these instructions at home: Pay attention to any changes in your condition. Take these actions to help manage your symptoms: Eating and drinking  Avoid: ? Coffee, tea, soft drinks, and energy drinks. ? Chocolate. ? Alcohol. ? Diet pills. Lifestyle  Try to lower your stress. These things can help you relax: ? Yoga. ? Deep breathing and meditation. ? Exercise. ? Using words and images to create positive thoughts (guided imagery). ? Using your mind to control things in your body (biofeedback).  Do not use drugs.  Get plenty of rest and sleep. Keep a regular bed time.   General instructions  Take over-the-counter and prescription medicines only as told by your doctor.  Do not use any products that contain nicotine or tobacco, such as cigarettes and e-cigarettes. If you need help quitting, ask your doctor.  Keep all follow-up visits as told by your doctor. This is important. You may need more tests if palpitations do not go away or get worse.   Contact a doctor if:  Your symptoms last more than 24 hours.  Your symptoms occur more often. Get help right away if you:  Have chest pain.  Feel short of breath.  Have a very bad headache.  Feel dizzy.  Pass out (faint). Summary  Palpitations are feelings that your heartbeat is uneven or faster than normal. It may feel like your heart is fluttering or skipping a beat.  Avoid food and drinks that may cause palpitations. These include caffeine, chocolate, and alcohol.  Try to lower your stress. Do not smoke or use drugs.  Get help right away if you faint or have chest pain, shortness of breath, a severe headache, or dizziness. This  information is not intended to replace advice given to you by your health care provider. Make sure you discuss any questions you have with your health care provider. Document Revised: 04/04/2017 Document Reviewed: 04/04/2017 Elsevier Patient Education  2021 [employer].

## 2020-04-01 NOTE — Progress Notes (Signed)
[facility] clinic  Provider:  Jaymes Graff Medina-Vargas  Code Status:  Full Code  Goals of Care:  Advanced Directives 04/01/2020  Does Patient Have a Medical Advance Directive? Yes  Type of Paramedic of [place];Living will  Does patient want to make changes to medical advance directive? No - Patient declined  Copy of [place] in Chart? No - copy requested  Would patient like information on creating a medical advance directive? -     Chief Complaint  Patient presents with  . Acute Visit    Complains of heart palpitations since Monday.    HPI: Patient is a 75 y.o. female seen today for an acute visit for palpitations and SOB. She has a PMH of Meniere's disease, muscular dystrophy, hyponatremia, vertigo, basal cell carcinoma and chronic migraine. She said that she has been having palpitations on and off for the past week. She had been taking Atenolol PRN for palpitations and it would usually take a while before she have the next one. For the past week, she has been having it once or twice a day. She stated that she has been having a lot of stress. She recently learned that her best friend is going to be placed in a nursing home and she feels that it is not right. 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 She said that she does not have a good experience in nursing homes since she has taken her mother to 71 nursing homes.    Past Medical History:  Diagnosis Date  . Bronchitis   . Cancer ([place]) 2009   Basal Cell  . Chronic migraine without aura, intractable, without status migrainosus   . Diverticulosis 2010  . Endometriosis   . Low sodium levels   . Malnutrition ([place])   . MD (muscular dystrophy) ([place])   . Meniere's disease, bilateral   . [facility]  . Peripheral neuropathy    Dr Erling Cruz  . Pneumonia   . Sensorineural hearing loss, bilateral   . Vertigo 2019     Past Surgical History:  Procedure Laterality Date  . ABDOMINAL HYSTERECTOMY  1980 or 1981   for Endometriosis  . 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 BREAST ENHANCEMENT SURGERY Bilateral 1979  . COLONOSCOPY  01/2012   Tics; Dr Teena Irani  . ESI      X 3 in 2003 & 02/17/2011 for L4-S1 symptoms  . LUMBAR LAMINECTOMY  12/2012   [facility] , [place]  . TONSILLECTOMY  1950 or 1951  . TUBAL LIGATION    . vocal cords stripped  1970    Allergies  Allergen Reactions  . Biaxin [Clarithromycin] Anaphylaxis  . Sulfonamide Derivatives Itching and Rash  . Tequin [Gatifloxacin] Anaphylaxis  . Demerol Nausea And Vomiting  . Lactose Intolerance (Gi) Other (See Comments)    Reaction:  Abdominal pain, gas, bloating  . Nsaids Other (See Comments)    Reaction:  GI upset/heartburn   . Risedronate Sodium Nausea And Vomiting and Other (See Comments)    Reaction:  GI upset/heartburn   . Rofecoxib Nausea And Vomiting and Other (See Comments)    Reaction:  GI upset/heartburn   . Alendronate Sodium Nausea And Vomiting  . Aspirin Other (See Comments)    Reaction:  Abdominal pain   . Fish Allergy Nausea And Vomiting  . Benadryl [  Diphenhydramine Hcl] Palpitations    Outpatient Encounter Medications as of 04/01/2020  Medication Sig  . atenolol (TENORMIN) 25 MG tablet Take 25 mg by mouth as needed (for severe tachycardia lasting more then 20 minutes).  . AZELAIC ACID EX Apply 1 application topically 2 (two) times daily.  . baclofen (LIORESAL) 10 MG tablet Take 10 mg by mouth at bedtime.  . Biotin 5000 MCG TABS Take 5,000 mcg by mouth daily.   . butalbital-acetaminophen-caffeine (FIORICET, ESGIC) 50-325-40 MG tablet Take 1 tablet by mouth every 6 (six) hours as needed (for severe pain).   . CALCIUM PO Take 400 mg by mouth 3 (three) times daily.   . Calcium-Magnesium-Vitamin D (CALCIUM MAGNESIUM PO) Take 250 mg by mouth as needed (For leg cramps).  . Carboxymethylcellulose Sodium  0.25 % SOLN Apply 1 drop to eye 3 (three) times daily.  . Cholecalciferol (VITAMIN D3) LIQD 1 drop by Does not apply route 3 (three) times a week.  . Evening Primrose Oil 1000 MG CAPS Take 1,000 mg by mouth daily at 12 noon.  Marland Kitchen FEMRING 0.05 MG/24HR RING Place vaginally every 3 (three) months.  . gabapentin (NEURONTIN) 100 MG capsule Take 200 mg by mouth at bedtime.   Marland Kitchen ipratropium (ATROVENT) 0.06 % nasal spray Place 2 sprays into both nostrils 2 (two) times daily as needed for rhinitis.  Marland Kitchen lactase (LACTAID) 3000 units tablet Take 3,000 Units by mouth as needed (when eating dairy products).   . Lutein 20 MG TABS Take 20 mg by mouth daily.   Marland Kitchen MAGNESIUM CITRATE PO Take 150 mg by mouth 2 (two) times daily.  . meclizine (ANTIVERT) 12.5 MG tablet Take 1 tablet (12.5 mg total) by mouth 3 (three) times daily as needed for dizziness.  . methocarbamol (ROBAXIN) 750 MG tablet Take 750 mg by mouth every 6 (six) hours as needed for muscle spasms.  . metroNIDAZOLE (METROGEL) 0.75 % gel Apply 1 application topically 2 (two) times daily. Apply to face for rosacea  . Multiple Vitamin (MULTIVITAMIN WITH MINERALS) TABS tablet Take 1 tablet by mouth daily.  . Multiple Vitamins-Minerals (PRESERVISION AREDS 2) CAPS Take 1 capsule by mouth 2 (two) times daily.  . Omega-3 1000 MG CAPS Take 1 capsule by mouth 2 (two) times daily.  . ondansetron (ZOFRAN-ODT) 4 MG disintegrating tablet TAKE 1 TABLET BY MOUTH EVERY 6 HOURS AS NEEDED FOR NAUSEA/VOMIT  . rizatriptan (MAXALT-MLT) 10 MG disintegrating tablet Take 10 mg by mouth as needed for migraine.   . [DISCONTINUED] levocetirizine (XYZAL) 5 MG tablet Take 5 mg by mouth every evening.   No facility-administered encounter medications on file as of 04/01/2020.    Review of Systems:  Review of Systems  Constitutional: Positive for appetite change. Negative for fever.       Poor appetite  HENT: Positive for congestion.   Eyes: Negative.   Respiratory: Positive for cough.         Productive cough with whitish phlegm  Gastrointestinal: Negative for constipation, nausea and vomiting.  Endocrine: Positive for cold intolerance.  Genitourinary: Negative.  Negative for difficulty urinating.  Musculoskeletal:       Has muscular dystrophy X 40 years so "I'm always in pain."  Skin: Negative.     Health Maintenance  Topic Date Due  . Hepatitis C Screening  Never done  . COVID-19 Vaccine (2 - Pfizer 3-dose series) 05/09/2019  . INFLUENZA VACCINE  10/05/2019  . COLONOSCOPY (Pts 45-66yr Insurance coverage will need to be confirmed)  11/20/2019  .  TETANUS/TDAP  01/04/2027  . DEXA SCAN  Completed  . PNA vac Low Risk Adult  Completed    Physical Exam: Vitals:   04/01/20 1310  BP: 120/80  Pulse: 62  Resp: 16  Temp: 97.7 F (36.5 C)  SpO2: 91%  Weight: 106 lb 12.8 oz (48.4 kg)  Height: '5\' 6"'  (1.676 m)   Body mass index is 17.24 kg/m. Physical Exam Constitutional:      Comments: thin  HENT:     Head: Normocephalic.     Mouth/Throat:     Mouth: Mucous membranes are moist.  Cardiovascular:     Rate and Rhythm: Normal rate and regular rhythm.     Pulses: Normal pulses.  Pulmonary:     Effort: Pulmonary effort is normal.     Breath sounds: Normal breath sounds.  Abdominal:     General: Abdomen is flat.     Palpations: Abdomen is soft.  Musculoskeletal:        General: No swelling. Normal range of motion.     Cervical back: Normal range of motion.  Skin:    General: Skin is warm and dry.  Neurological:     General: No focal deficit present.     Mental Status: She is alert and oriented to person, place, and time.     Gait: Gait normal.  Psychiatric:        Thought Content: Thought content normal.     Comments: crying     Labs reviewed: Basic Metabolic Panel: Recent Labs    09/14/19 1723  NA 129*  K 3.9  CL 91*  CO2 28  GLUCOSE 119*  BUN 17  CREATININE 0.57  CALCIUM 9.4   CBC: Recent Labs    09/14/19 1723  WBC 4.1  HGB 14.6   HCT 43.4  MCV 91.4  PLT 256   Lipid Panel:  Lab Results  Component Value Date   HGBA1C 5.6 03/03/2011     Assessment/Plan  1. Shortness of breath -  Denies fever  - SARS-COV-2 RNA,(COVID-19) QUAL NAAT  2. Palpitations -  EKG showed sinus rhythm - CBC with Differential/Platelets - CMP with eGFR([facility]) - TSH - Magnesium - EKG 12-Lead   3. Vertigo -  Takes Meclizine 12.5 mg TID PRN  4. Chronic hyponatremia Lab Results  Component Value Date   NA 129 (L) 09/14/2019   K 3.9 09/14/2019   CO2 28 09/14/2019   BUN 17 09/14/2019   CREATININE 0.57 09/14/2019   CALCIUM 9.4 09/14/2019   GLUCOSE 119 (H) 09/14/2019   -  Started  Taking NaCl 1 gm last night    Labs/tests ordered:  CBC, CMP, tsh, EKG, Mg  Next appt:  As needed

## 2020-04-02 LAB — CBC WITH DIFFERENTIAL/PLATELET
Absolute Monocytes: 393 cells/uL (ref 200–950)
Basophils Absolute: 23 cells/uL (ref 0–200)
Basophils Relative: 0.3 %
Eosinophils Absolute: 23 cells/uL (ref 15–500)
Eosinophils Relative: 0.3 %
HCT: 43.8 % (ref 35.0–45.0)
Hemoglobin: 14.6 g/dL (ref 11.7–15.5)
Lymphs Abs: 778 cells/uL — ABNORMAL LOW (ref 850–3900)
MCH: 30.5 pg (ref 27.0–33.0)
MCHC: 33.3 g/dL (ref 32.0–36.0)
MCV: 91.4 fL (ref 80.0–100.0)
MPV: 10.9 fL (ref 7.5–12.5)
Monocytes Relative: 5.1 %
Neutro Abs: 6483 cells/uL (ref 1500–7800)
Neutrophils Relative %: 84.2 %
Platelets: 262 10*3/uL (ref 140–400)
RBC: 4.79 10*6/uL (ref 3.80–5.10)
RDW: 11.8 % (ref 11.0–15.0)
Total Lymphocyte: 10.1 %
WBC: 7.7 10*3/uL (ref 3.8–10.8)

## 2020-04-02 LAB — TSH: TSH: 3.76 mIU/L (ref 0.40–4.50)

## 2020-04-02 LAB — COMPLETE METABOLIC PANEL WITH GFR
AG Ratio: 1.8 (calc) (ref 1.0–2.5)
ALT: 15 U/L (ref 6–29)
AST: 21 U/L (ref 10–35)
Albumin: 4.6 g/dL (ref 3.6–5.1)
Alkaline phosphatase (APISO): 57 U/L (ref 37–153)
BUN/Creatinine Ratio: 29 (calc) — ABNORMAL HIGH (ref 6–22)
BUN: 17 mg/dL (ref 7–25)
CO2: 29 mmol/L (ref 20–32)
Calcium: 9.8 mg/dL (ref 8.6–10.4)
Chloride: 92 mmol/L — ABNORMAL LOW (ref 98–110)
Creat: 0.59 mg/dL — ABNORMAL LOW (ref 0.60–0.93)
GFR, Est African American: 104 mL/min/{1.73_m2} (ref >=60)
GFR, Est Non African American: 90 mL/min/{1.73_m2} (ref >=60)
Globulin: 2.6 g/dL (calc) (ref 1.9–3.7)
Glucose, Bld: 122 mg/dL (ref 65–139)
Potassium: 4 mmol/L (ref 3.5–5.3)
Sodium: 131 mmol/L — ABNORMAL LOW (ref 135–146)
Total Bilirubin: 0.5 mg/dL (ref 0.2–1.2)
Total Protein: 7.2 g/dL (ref 6.1–8.1)

## 2020-04-02 LAB — MAGNESIUM: Magnesium: 2.2 mg/dL (ref 1.5–2.5)

## 2020-04-03 LAB — SARS-COV-2 RNA,(COVID-19) QUALITATIVE NAAT: SARS CoV2 RNA: NOT DETECTED

## 2020-04-05 ENCOUNTER — Encounter: Payer: Self-pay | Admitting: Family

## 2020-04-05 ENCOUNTER — Inpatient Hospital Stay (HOSPITAL_COMMUNITY)
Admission: EM | Admit: 2020-04-05 | Discharge: 2020-04-12 | DRG: 309 | Disposition: A | Discharge status: Home Health | Payer: Medicare Other | Attending: Internal Medicine | Admitting: Internal Medicine

## 2020-04-05 ENCOUNTER — Emergency Department (HOSPITAL_COMMUNITY): Payer: Medicare Other

## 2020-04-05 ENCOUNTER — Other Ambulatory Visit: Payer: Self-pay

## 2020-04-05 ENCOUNTER — Ambulatory Visit (INDEPENDENT_AMBULATORY_CARE_PROVIDER_SITE_OTHER): Payer: Medicare Other | Admitting: Family

## 2020-04-05 VITALS — BP 110/80 | HR 114 | Temp 97.3°F | Resp 16 | Ht 66.0 in

## 2020-04-05 DIAGNOSIS — R0789 Other chest pain: Secondary | ICD-10-CM

## 2020-04-05 DIAGNOSIS — Z8261 Family history of arthritis: Secondary | ICD-10-CM

## 2020-04-05 DIAGNOSIS — Z888 Allergy status to other drugs, medicaments and biological substances status: Secondary | ICD-10-CM

## 2020-04-05 DIAGNOSIS — Z811 Family history of alcohol abuse and dependence: Secondary | ICD-10-CM

## 2020-04-05 DIAGNOSIS — Z881 Allergy status to other antibiotic agents status: Secondary | ICD-10-CM

## 2020-04-05 DIAGNOSIS — R110 Nausea: Secondary | ICD-10-CM

## 2020-04-05 DIAGNOSIS — M8580 Other specified disorders of bone density and structure, unspecified site: Secondary | ICD-10-CM | POA: Diagnosis present

## 2020-04-05 DIAGNOSIS — R0602 Shortness of breath: Secondary | ICD-10-CM

## 2020-04-05 DIAGNOSIS — Z820 Family history of epilepsy and other diseases of the nervous system: Secondary | ICD-10-CM

## 2020-04-05 DIAGNOSIS — R5383 Other fatigue: Secondary | ICD-10-CM | POA: Diagnosis not present

## 2020-04-05 DIAGNOSIS — Z79899 Other long term (current) drug therapy: Secondary | ICD-10-CM

## 2020-04-05 DIAGNOSIS — H8109 Meniere's disease, unspecified ear: Secondary | ICD-10-CM | POA: Diagnosis present

## 2020-04-05 DIAGNOSIS — E871 Hypo-osmolality and hyponatremia: Secondary | ICD-10-CM | POA: Diagnosis not present

## 2020-04-05 DIAGNOSIS — Z8249 Family history of ischemic heart disease and other diseases of the circulatory system: Secondary | ICD-10-CM

## 2020-04-05 DIAGNOSIS — Z20822 Contact with and (suspected) exposure to covid-19: Secondary | ICD-10-CM | POA: Diagnosis not present

## 2020-04-05 DIAGNOSIS — M549 Dorsalgia, unspecified: Secondary | ICD-10-CM | POA: Diagnosis present

## 2020-04-05 DIAGNOSIS — R002 Palpitations: Secondary | ICD-10-CM | POA: Diagnosis present

## 2020-04-05 DIAGNOSIS — Z882 Allergy status to sulfonamides status: Secondary | ICD-10-CM

## 2020-04-05 DIAGNOSIS — Z823 Family history of stroke: Secondary | ICD-10-CM

## 2020-04-05 DIAGNOSIS — Z886 Allergy status to analgesic agent status: Secondary | ICD-10-CM

## 2020-04-05 DIAGNOSIS — I4892 Unspecified atrial flutter: Secondary | ICD-10-CM | POA: Diagnosis present

## 2020-04-05 DIAGNOSIS — G8929 Other chronic pain: Secondary | ICD-10-CM | POA: Diagnosis present

## 2020-04-05 DIAGNOSIS — R079 Chest pain, unspecified: Secondary | ICD-10-CM | POA: Diagnosis not present

## 2020-04-05 DIAGNOSIS — Z681 Body mass index (BMI) 19 or less, adult: Secondary | ICD-10-CM | POA: Diagnosis not present

## 2020-04-05 DIAGNOSIS — E739 Lactose intolerance, unspecified: Secondary | ICD-10-CM | POA: Diagnosis present

## 2020-04-05 DIAGNOSIS — G43909 Migraine, unspecified, not intractable, without status migrainosus: Secondary | ICD-10-CM | POA: Diagnosis present

## 2020-04-05 DIAGNOSIS — G7100 Muscular dystrophy, unspecified: Secondary | ICD-10-CM | POA: Diagnosis present

## 2020-04-05 DIAGNOSIS — Z83438 Family history of other disorder of lipoprotein metabolism and other lipidemia: Secondary | ICD-10-CM

## 2020-04-05 DIAGNOSIS — J449 Chronic obstructive pulmonary disease, unspecified: Secondary | ICD-10-CM | POA: Diagnosis present

## 2020-04-05 DIAGNOSIS — I951 Orthostatic hypotension: Secondary | ICD-10-CM | POA: Diagnosis not present

## 2020-04-05 DIAGNOSIS — R059 Cough, unspecified: Secondary | ICD-10-CM | POA: Diagnosis present

## 2020-04-05 DIAGNOSIS — R636 Underweight: Secondary | ICD-10-CM | POA: Diagnosis present

## 2020-04-05 DIAGNOSIS — Z8262 Family history of osteoporosis: Secondary | ICD-10-CM

## 2020-04-05 DIAGNOSIS — Z9109 Other allergy status, other than to drugs and biological substances: Secondary | ICD-10-CM

## 2020-04-05 DIAGNOSIS — Z841 Family history of disorders of kidney and ureter: Secondary | ICD-10-CM

## 2020-04-05 DIAGNOSIS — H903 Sensorineural hearing loss, bilateral: Secondary | ICD-10-CM | POA: Diagnosis present

## 2020-04-05 DIAGNOSIS — I4891 Unspecified atrial fibrillation: Secondary | ICD-10-CM | POA: Diagnosis not present

## 2020-04-05 DIAGNOSIS — G629 Polyneuropathy, unspecified: Secondary | ICD-10-CM | POA: Diagnosis present

## 2020-04-05 DIAGNOSIS — I10 Essential (primary) hypertension: Secondary | ICD-10-CM | POA: Diagnosis not present

## 2020-04-05 DIAGNOSIS — Z833 Family history of diabetes mellitus: Secondary | ICD-10-CM

## 2020-04-05 LAB — CBC
HCT: 41.8 % (ref 36.0–46.0)
Hemoglobin: 14.4 g/dL (ref 12.0–15.0)
MCH: 31.1 pg (ref 26.0–34.0)
MCHC: 34.4 g/dL (ref 30.0–36.0)
MCV: 90.3 fL (ref 80.0–100.0)
Platelets: 257 10*3/uL (ref 150–400)
RBC: 4.63 MIL/uL (ref 3.87–5.11)
RDW: 13.2 % (ref 11.5–15.5)
WBC: 6.2 10*3/uL (ref 4.0–10.5)
nRBC: 0 % (ref 0.0–0.2)

## 2020-04-05 LAB — BASIC METABOLIC PANEL
Anion gap: 9 (ref 5–15)
BUN: 16 mg/dL (ref 8–23)
CO2: 26 mmol/L (ref 22–32)
Calcium: 9.9 mg/dL (ref 8.9–10.3)
Chloride: 95 mmol/L — ABNORMAL LOW (ref 98–111)
Creatinine, Ser: 0.51 mg/dL (ref 0.44–1.00)
GFR, Estimated: >60 mL/min (ref >60)
Glucose, Bld: 103 mg/dL — ABNORMAL HIGH (ref 70–99)
Potassium: 4.2 mmol/L (ref 3.5–5.1)
Sodium: 130 mmol/L — ABNORMAL LOW (ref 135–145)

## 2020-04-05 LAB — TROPONIN I (HIGH SENSITIVITY)
Troponin I (High Sensitivity): 5 ng/L (ref <18)
Troponin I (High Sensitivity): 5 ng/L (ref <18)

## 2020-04-05 NOTE — Progress Notes (Signed)
Provider: Servando Kyllonen FNP-C  Lauree Chandler, NP  Patient Care Team: Lauree Chandler, NP as PCP - General (Geriatric Medicine) Deliah Goody, PA-C as Consulting Physician (Physician Assistant) Danella Sensing, MD as Consulting Physician (Dermatology) Everett Graff, MD as Consulting Physician (Obstetrics and Gynecology) Vicie Mutters, MD as Consulting Physician (Otolaryngology) Marshell Garfinkel, MD as Consulting Physician (Pulmonary Disease) Gayla Medicus, MD as Referring Physician (Neurology)  Extended Emergency Contact Information Primary Emergency Contact: Lindh,[name] Address: [address], [place] Phone: [phone] Work Phone: [phone] Relation: Spouse  Code Status: Full Code  Goals of care: Advanced Directive information Advanced Directives 04/05/2020  Does Patient Have a Medical Advance Directive? Yes  Type of Paramedic of [place];Living will  Does patient want to make changes to medical advance directive? No - Patient declined  Copy of [place] in Chart? Yes - validated most recent copy scanned in chart (See row information)  Would patient like information on creating a medical advance directive? -     Chief Complaint  Patient presents with  . Acute Visit    Complains of extreme fatigue, SOB, and Cough. Patient states her heart is still having issues.     HPI:  Pt is a 75 y.o. female seen today for an acute visit for evaluation of chest pain,palpitation,fatigue,cough and shortness of breath x 4-5 days.  She has a medical history of Muscular dystrophy, meniere's disease bilateral,chronic migraine without aura,Hyponatremia,Peripheral Neuropathy,vertigo,basal cell carcer among other conditions.  States chest pain started 3 days ago.she took chewable Asprin for the past three days which helped with her chest pain.she describes chest pain as squeezing on left  breast.Chest pain does not radiate to the arm or jaw.states was afraid to go to ED due to COVID-19. Shortness of breath worst with activity. Has had cough.coughs up phlegm.drainage from the nose at the back of the throat worsen the cough. She denies any fever,chills,loss of taste or smell.has not been in contact with sick person with COVID-19.states has been staying home most of the time with Her Husband.  Had an episode of squeezing chest pain with provider in the room.Asprin Tablet offered but decline states needs chewable which was not available in the office.   Of note she was seen here by Madaline Brilliant on 04/01/2020 for shortness of breath, palpitations,vertigo and chronic hyponatremia.SARS-COV-2 RNA test done was negative. EKG done showed sinus Rhythm with left arterial enlargement HR she was advised to continue on meclizine 12.5 mg tablet three times as needed.Her labs were unremarkable except sodium level 131 at baseline previous was 129 (09/14/2019); 131; 133; 131 Her TSH level was also within normal range 3.76    Past Medical History:  Diagnosis Date  . Bronchitis   . Cancer ([place]) 2009   Basal Cell  . Chronic migraine without aura, intractable, without status migrainosus   . Diverticulosis 2010  . Endometriosis   . Low sodium levels   . Malnutrition ([place])   . MD (muscular dystrophy) ([place])   . Meniere's disease, bilateral   . [facility]    [facility]  . Peripheral neuropathy    Dr Erling Cruz  . Pneumonia   . Sensorineural hearing loss, bilateral   . Vertigo 2019   Past Surgical History:  Procedure Laterality Date  . ABDOMINAL HYSTERECTOMY  1980 or 1981   for Endometriosis  . APPENDECTOMY  1965   high  school; low grade appendiceal cancer  . BREAST ENHANCEMENT SURGERY Bilateral 1979  . COLONOSCOPY  01/2012   T[place]; Dr Teena Irani  . ESI      X 3 in 2003 & 02/17/2011 for L4-S1 symptoms  . LUMBAR LAMINECTOMY  12/2012   [facility] [place]  . TONSILLECTOMY  1950 or 1951  .  TUBAL LIGATION    . vocal cords stripped  1970    Allergies  Allergen Reactions  . Biaxin [Clarithromycin] Anaphylaxis  . Sulfonamide Derivatives Itching and Rash  . Tequin [Gatifloxacin] Anaphylaxis  . Demerol Nausea And Vomiting  . Lactose Intolerance (Gi) Other (See Comments)    Reaction:  Abdominal pain, gas, bloating  . Nsaids Other (See Comments)    Reaction:  GI upset/heartburn   . Risedronate Sodium Nausea And Vomiting and Other (See Comments)    Reaction:  GI upset/heartburn   . Rofecoxib Nausea And Vomiting and Other (See Comments)    Reaction:  GI upset/heartburn   . Alendronate Sodium Nausea And Vomiting  . Aspirin Other (See Comments)    Reaction:  Abdominal pain   . Fish Allergy Nausea And Vomiting  . Benadryl [Diphenhydramine Hcl] Palpitations    Outpatient Encounter Medications as of 04/05/2020  Medication Sig  . atenolol (TENORMIN) 25 MG tablet Take 25 mg by mouth as needed (for severe tachycardia lasting more then 20 minutes).  . AZELAIC ACID EX Apply 1 application topically 2 (two) times daily.  . baclofen (LIORESAL) 10 MG tablet Take 10 mg by mouth at bedtime.  . Biotin 5000 MCG TABS Take 5,000 mcg by mouth daily.   . butalbital-acetaminophen-caffeine (FIORICET, ESGIC) 50-325-40 MG tablet Take 1 tablet by mouth every 6 (six) hours as needed (for severe pain).   . CALCIUM PO Take 400 mg by mouth 3 (three) times daily.   . Calcium-Magnesium-Vitamin D (CALCIUM MAGNESIUM PO) Take 250 mg by mouth as needed (For leg cramps).  . Carboxymethylcellulose Sodium 0.25 % SOLN Apply 1 drop to eye 3 (three) times daily.  . Cholecalciferol (VITAMIN D3) LIQD 1 drop by Does not apply route 3 (three) times a week.  . Evening Primrose Oil 1000 MG CAPS Take 1,000 mg by mouth daily at 12 noon.  Marland Kitchen FEMRING 0.05 MG/24HR RING Place vaginally every 3 (three) months.  . gabapentin (NEURONTIN) 100 MG capsule Take 200 mg by mouth at bedtime.   Marland Kitchen ipratropium (ATROVENT) 0.06 % nasal spray  Place 2 sprays into both nostrils 2 (two) times daily as needed for rhinitis.  Marland Kitchen lactase (LACTAID) 3000 units tablet Take 3,000 Units by mouth as needed (when eating dairy products).   . Lutein 20 MG TABS Take 20 mg by mouth daily.   Marland Kitchen MAGNESIUM CITRATE PO Take 150 mg by mouth 2 (two) times daily.  . meclizine (ANTIVERT) 12.5 MG tablet Take 1 tablet (12.5 mg total) by mouth 3 (three) times daily as needed for dizziness.  . methocarbamol (ROBAXIN) 750 MG tablet Take 750 mg by mouth every 6 (six) hours as needed for muscle spasms.  . metroNIDAZOLE (METROGEL) 0.75 % gel Apply 1 application topically 2 (two) times daily. Apply to face for rosacea  . Multiple Vitamin (MULTIVITAMIN WITH MINERALS) TABS tablet Take 1 tablet by mouth daily.  . Multiple Vitamins-Minerals (PRESERVISION AREDS 2) CAPS Take 1 capsule by mouth 2 (two) times daily.  . Omega-3 1000 MG CAPS Take 1 capsule by mouth 2 (two) times daily.  . ondansetron (ZOFRAN-ODT) 4 MG disintegrating tablet TAKE 1  TABLET BY MOUTH EVERY 6 HOURS AS NEEDED FOR NAUSEA/VOMIT  . rizatriptan (MAXALT-MLT) 10 MG disintegrating tablet Take 10 mg by mouth as needed for migraine.    No facility-administered encounter medications on file as of 04/05/2020.    Review of Systems  Constitutional: Positive for fatigue. Negative for appetite change, chills and fever.  HENT: Positive for postnasal drip. Negative for congestion, rhinorrhea, sinus pressure, sinus pain, sneezing, sore throat and trouble swallowing.   Respiratory: Positive for shortness of breath. Negative for cough, chest tightness and wheezing.   Cardiovascular: Positive for chest pain. Negative for palpitations and leg swelling.  Gastrointestinal: Negative for abdominal distention, abdominal pain, constipation, diarrhea, nausea and vomiting.  Endocrine: Negative for cold intolerance and heat intolerance.  Musculoskeletal: Negative for back pain, gait problem and joint swelling.  Skin: Negative for  color change, pallor and rash.  Neurological: Negative for speech difficulty, weakness, light-headedness and headaches.       Hx vertigo none this visit   Hematological: Does not bruise/bleed easily.  Psychiatric/Behavioral: Negative for agitation, behavioral problems, confusion and sleep disturbance.    Immunization History  Administered Date(s) Administered  . Fluad Quad(high Dose 65+) 02/18/2019  . Influenza Whole 01/27/2008  . Influenza, High Dose Seasonal PF 12/05/2013, 01/25/2018  . PFIZER(Purple Top)SARS-COV-2 Vaccination 04/18/2019  . Pneumococcal Conjugate-13 11/19/2013  . Pneumococcal Polysaccharide-23 02/14/2018  . Td 01/03/2005  . Tdap 01/03/2017  . Zoster 02/23/2009   Pertinent  Health Maintenance Due  Topic Date Due  . INFLUENZA VACCINE  10/05/2019  . COLONOSCOPY (Pts 45-66yrs Insurance coverage will need to be confirmed)  11/20/2019  . DEXA SCAN  Completed  . PNA vac Low Risk Adult  Completed   Fall Risk  04/05/2020 04/01/2020 03/29/2020 04/17/2019 03/28/2019  Falls in the past year? 0 0 0 0 1  Number falls in past yr: 0 0 0 0 0  Injury with Fall? 0 0 0 0 1  Comment - - - - -  Risk for fall due to : - - - - Impaired balance/gait   Functional Status Survey:    Vitals:   04/05/20 1541  BP: 110/80  Pulse: (!) 114  Resp: 16  Temp: (!) 97.3 F (36.3 C)  SpO2: 94%  Height: 5\' 6"  (1.676 m)   Body mass index is 17.24 kg/m. Physical Exam Vitals reviewed.  Constitutional:      General: She is not in acute distress.    Appearance: She is underweight. She is not ill-appearing.  HENT:     Mouth/Throat:     Mouth: Mucous membranes are moist.     Pharynx: Oropharynx is clear. No oropharyngeal exudate or posterior oropharyngeal erythema.  Eyes:     General: No scleral icterus.       Right eye: No discharge.        Left eye: No discharge.     Extraocular Movements: Extraocular movements intact.     Conjunctiva/sclera: Conjunctivae normal.     Pupils: Pupils are  equal, round, and reactive to light.  Neck:     Vascular: No carotid bruit.  Cardiovascular:     Rate and Rhythm: Normal rate and regular rhythm.     Pulses: Normal pulses.     Heart sounds: Normal heart sounds. No murmur heard. No friction rub. No gallop.   Pulmonary:     Effort: Pulmonary effort is normal. No respiratory distress.     Breath sounds: Normal breath sounds. No wheezing, rhonchi or rales.  Chest:  Chest wall: No tenderness.  Abdominal:     General: Bowel sounds are normal. There is no distension.     Palpations: Abdomen is soft. There is no mass.     Tenderness: There is no abdominal tenderness. There is no right CVA tenderness, left CVA tenderness, guarding or rebound.  Musculoskeletal:        General: No swelling or tenderness. Normal range of motion.     Cervical back: Normal range of motion. No rigidity or tenderness.     Right lower leg: No edema.     Left lower leg: No edema.  Lymphadenopathy:     Cervical: No cervical adenopathy.  Skin:    General: Skin is warm and dry.     Coloration: Skin is not pale.     Findings: No bruising or erythema.  Neurological:     Mental Status: She is alert and oriented to person, place, and time.     Cranial Nerves: No cranial nerve deficit.     Motor: No weakness.     Gait: Gait normal.  Psychiatric:        Mood and Affect: Mood is anxious.        Speech: Speech normal.        Behavior: Behavior normal.      Labs reviewed: Recent Labs    09/14/19 1723 04/01/20 1352  NA 129* 131*  K 3.9 4.0  CL 91* 92*  CO2 28 29  GLUCOSE 119* 122  BUN 17 17  CREATININE 0.57 0.59*  CALCIUM 9.4 9.8  MG  --  2.2   Recent Labs    04/01/20 1352  AST 21  ALT 15  BILITOT 0.5  PROT 7.2   Recent Labs    09/14/19 1723 04/01/20 1352  WBC 4.1 7.7  NEUTROABS  --  6,483  HGB 14.6 14.6  HCT 43.4 43.8  MCV 91.4 91.4  PLT 256 262   Lab Results  Component Value Date   TSH 3.76 04/01/2020   Lab Results  Component  Value Date   HGBA1C 5.6 03/03/2011   Lab Results  Component Value Date   CHOL 192 03/19/2018   HDL 88 03/19/2018   LDLCALC 91 03/19/2018   LDLDIRECT 124.7 08/20/2012   TRIG 52 03/19/2018   CHOLHDL 2.2 03/19/2018    Significant Diagnostic Results in last 30 days:  No results found.  Assessment/Plan 1. Palpitations Reports ongoing palpitation  - EKG 12-Lead done today indicates sinus rhythm with left arterial enlargement with HR 94 b/min similar to recent EKG done 04/01/2020. Previous EKG done 09/14/2019 showed sinus Rhythm with occasional PVCs. On Atenolol 25 mg tablet as needed for tachycardia lasting > 20 minutes.   2. Other chest pain Reports squeezing chest pain under breast site. - EKG as above. - Recommended evaluation in the ED.Staff advised to call 9-1-1  - Asprin 81 mg tablet offered but declines states will take too long would like to get in ED.   3. Shortness of breath Reports shortness of breath with exertion.No shortness of breath or cough noted during visit.No signs of fluid overload.  4. Fatigue, unspecified type Unclear etiology but possible multifactorial related to her cardiac symptoms and muscular dystrophy. - send to ED as above.   Family/ staff Communication: Reviewed plan of care with patient and Husband verbalized understanding.   Labs/tests ordered: - EKG 12  Next Appointment: send to ED via EMS left in stable condition.   Caesar Bookman, NP

## 2020-04-05 NOTE — ED Triage Notes (Signed)
Pt from [facility] for eval of chest pain under L breast, weakness, fatigue and cough x 1 week. EKG there same as last Thursday when she was seen for same. Negative covid test.

## 2020-04-05 NOTE — Patient Instructions (Signed)
-   Send to ED via EMS in stable condition.

## 2020-04-06 ENCOUNTER — Inpatient Hospital Stay (HOSPITAL_COMMUNITY): Payer: Medicare Other

## 2020-04-06 ENCOUNTER — Encounter (HOSPITAL_COMMUNITY): Payer: Self-pay | Admitting: Internal Medicine

## 2020-04-06 DIAGNOSIS — Z882 Allergy status to sulfonamides status: Secondary | ICD-10-CM | POA: Diagnosis not present

## 2020-04-06 DIAGNOSIS — R079 Chest pain, unspecified: Secondary | ICD-10-CM

## 2020-04-06 DIAGNOSIS — I951 Orthostatic hypotension: Secondary | ICD-10-CM | POA: Diagnosis not present

## 2020-04-06 DIAGNOSIS — Z8249 Family history of ischemic heart disease and other diseases of the circulatory system: Secondary | ICD-10-CM | POA: Diagnosis not present

## 2020-04-06 DIAGNOSIS — Z823 Family history of stroke: Secondary | ICD-10-CM | POA: Diagnosis not present

## 2020-04-06 DIAGNOSIS — Z881 Allergy status to other antibiotic agents status: Secondary | ICD-10-CM | POA: Diagnosis not present

## 2020-04-06 DIAGNOSIS — E871 Hypo-osmolality and hyponatremia: Secondary | ICD-10-CM

## 2020-04-06 DIAGNOSIS — R002 Palpitations: Secondary | ICD-10-CM | POA: Diagnosis present

## 2020-04-06 DIAGNOSIS — G43909 Migraine, unspecified, not intractable, without status migrainosus: Secondary | ICD-10-CM | POA: Diagnosis not present

## 2020-04-06 DIAGNOSIS — Z886 Allergy status to analgesic agent status: Secondary | ICD-10-CM | POA: Diagnosis not present

## 2020-04-06 DIAGNOSIS — E739 Lactose intolerance, unspecified: Secondary | ICD-10-CM | POA: Diagnosis present

## 2020-04-06 DIAGNOSIS — Z20822 Contact with and (suspected) exposure to covid-19: Secondary | ICD-10-CM | POA: Diagnosis not present

## 2020-04-06 DIAGNOSIS — I700 Atherosclerosis of aorta: Secondary | ICD-10-CM | POA: Diagnosis not present

## 2020-04-06 DIAGNOSIS — I4891 Unspecified atrial fibrillation: Principal | ICD-10-CM | POA: Diagnosis present

## 2020-04-06 DIAGNOSIS — Z681 Body mass index (BMI) 19 or less, adult: Secondary | ICD-10-CM | POA: Diagnosis not present

## 2020-04-06 DIAGNOSIS — R636 Underweight: Secondary | ICD-10-CM | POA: Diagnosis present

## 2020-04-06 DIAGNOSIS — R0789 Other chest pain: Secondary | ICD-10-CM | POA: Diagnosis not present

## 2020-04-06 DIAGNOSIS — Z9109 Other allergy status, other than to drugs and biological substances: Secondary | ICD-10-CM | POA: Diagnosis not present

## 2020-04-06 DIAGNOSIS — R059 Cough, unspecified: Secondary | ICD-10-CM | POA: Diagnosis not present

## 2020-04-06 DIAGNOSIS — H903 Sensorineural hearing loss, bilateral: Secondary | ICD-10-CM | POA: Diagnosis present

## 2020-04-06 DIAGNOSIS — G629 Polyneuropathy, unspecified: Secondary | ICD-10-CM | POA: Diagnosis present

## 2020-04-06 DIAGNOSIS — Z811 Family history of alcohol abuse and dependence: Secondary | ICD-10-CM | POA: Diagnosis not present

## 2020-04-06 DIAGNOSIS — G7100 Muscular dystrophy, unspecified: Secondary | ICD-10-CM | POA: Diagnosis not present

## 2020-04-06 DIAGNOSIS — R0602 Shortness of breath: Secondary | ICD-10-CM | POA: Diagnosis not present

## 2020-04-06 DIAGNOSIS — R918 Other nonspecific abnormal finding of lung field: Secondary | ICD-10-CM | POA: Diagnosis not present

## 2020-04-06 DIAGNOSIS — Z833 Family history of diabetes mellitus: Secondary | ICD-10-CM | POA: Diagnosis not present

## 2020-04-06 DIAGNOSIS — M8580 Other specified disorders of bone density and structure, unspecified site: Secondary | ICD-10-CM | POA: Diagnosis present

## 2020-04-06 DIAGNOSIS — Z888 Allergy status to other drugs, medicaments and biological substances status: Secondary | ICD-10-CM | POA: Diagnosis not present

## 2020-04-06 LAB — URINALYSIS, ROUTINE W REFLEX MICROSCOPIC
Bilirubin Urine: NEGATIVE
Glucose, UA: NEGATIVE mg/dL
Ketones, ur: NEGATIVE mg/dL
Leukocytes,Ua: NEGATIVE
Nitrite: NEGATIVE
Protein, ur: NEGATIVE mg/dL
Specific Gravity, Urine: 1.004 — ABNORMAL LOW (ref 1.005–1.030)
pH: 9 — ABNORMAL HIGH (ref 5.0–8.0)

## 2020-04-06 LAB — CBC
HCT: 39.7 % (ref 36.0–46.0)
Hemoglobin: 13.8 g/dL (ref 12.0–15.0)
MCH: 31.2 pg (ref 26.0–34.0)
MCHC: 34.8 g/dL (ref 30.0–36.0)
MCV: 89.8 fL (ref 80.0–100.0)
Platelets: 228 10*3/uL (ref 150–400)
RBC: 4.42 MIL/uL (ref 3.87–5.11)
RDW: 13.4 % (ref 11.5–15.5)
WBC: 6 10*3/uL (ref 4.0–10.5)
nRBC: 0 % (ref 0.0–0.2)

## 2020-04-06 LAB — RAPID URINE DRUG SCREEN, HOSP PERFORMED
Amphetamines: NOT DETECTED
Barbiturates: NOT DETECTED
Benzodiazepines: NOT DETECTED
Cocaine: NOT DETECTED
Opiates: NOT DETECTED
Tetrahydrocannabinol: NOT DETECTED

## 2020-04-06 LAB — MAGNESIUM: Magnesium: 2.2 mg/dL (ref 1.7–2.4)

## 2020-04-06 LAB — BASIC METABOLIC PANEL
Anion gap: 9 (ref 5–15)
BUN: 8 mg/dL (ref 8–23)
CO2: 24 mmol/L (ref 22–32)
Calcium: 9 mg/dL (ref 8.9–10.3)
Chloride: 101 mmol/L (ref 98–111)
Creatinine, Ser: 0.48 mg/dL (ref 0.44–1.00)
GFR, Estimated: >60 mL/min (ref >60)
Glucose, Bld: 98 mg/dL (ref 70–99)
Potassium: 4.6 mmol/L (ref 3.5–5.1)
Sodium: 134 mmol/L — ABNORMAL LOW (ref 135–145)

## 2020-04-06 LAB — ECHOCARDIOGRAM COMPLETE
Area-P 1/2: 4.6 cm2
Height: 66 in
S' Lateral: 2.6 cm
Weight: 1680 oz

## 2020-04-06 LAB — SARS CORONAVIRUS 2 BY RT PCR (HOSPITAL ORDER, PERFORMED IN ~~LOC~~ HOSPITAL LAB): SARS Coronavirus 2: NEGATIVE

## 2020-04-06 LAB — TROPONIN I (HIGH SENSITIVITY): Troponin I (High Sensitivity): 8 ng/L (ref <18)

## 2020-04-06 MED ORDER — ACETAMINOPHEN 325 MG PO TABS
650.0000 mg | ORAL_TABLET | Freq: Four times a day (QID) | ORAL | Status: DC | PRN
  Administered 2020-04-09 – 2020-04-10 (×2): 650 mg via ORAL
  Filled 2020-04-06 (×2): qty 2

## 2020-04-06 MED ORDER — BUTALBITAL-APAP-CAFFEINE 50-325-40 MG PO TABS
1.0000 | ORAL_TABLET | ORAL | Status: DC | PRN
  Administered 2020-04-11: 1 via ORAL
  Filled 2020-04-06 (×4): qty 1

## 2020-04-06 MED ORDER — DILTIAZEM HCL-DEXTROSE 125-5 MG/125ML-% IV SOLN (PREMIX)
5.0000 mg/h | INTRAVENOUS | Status: DC
  Administered 2020-04-06: 5 mg/h via INTRAVENOUS
  Filled 2020-04-06: qty 125

## 2020-04-06 MED ORDER — CALCIUM CARBONATE 1250 (500 CA) MG PO TABS
1.0000 | ORAL_TABLET | Freq: Two times a day (BID) | ORAL | Status: DC
  Administered 2020-04-06 – 2020-04-12 (×10): 500 mg via ORAL
  Filled 2020-04-06 (×10): qty 1

## 2020-04-06 MED ORDER — METHOCARBAMOL 500 MG PO TABS
750.0000 mg | ORAL_TABLET | Freq: Four times a day (QID) | ORAL | Status: DC | PRN
  Administered 2020-04-07 – 2020-04-12 (×10): 750 mg via ORAL
  Filled 2020-04-06 (×12): qty 2

## 2020-04-06 MED ORDER — BUTALBITAL-APAP-CAFFEINE 50-325-40 MG PO TABS
1.0000 | ORAL_TABLET | Freq: Once | ORAL | Status: AC
  Administered 2020-04-06: 1 via ORAL
  Filled 2020-04-06: qty 1

## 2020-04-06 MED ORDER — GABAPENTIN 100 MG PO CAPS
200.0000 mg | ORAL_CAPSULE | Freq: Every day | ORAL | Status: DC
  Administered 2020-04-06 – 2020-04-07 (×2): 200 mg via ORAL
  Filled 2020-04-06 (×2): qty 2

## 2020-04-06 MED ORDER — BUTALBITAL-APAP-CAFFEINE 50-325-40 MG PO TABS: 1.0000 | ORAL_TABLET | Freq: Four times a day (QID) | ORAL | Status: DC | PRN

## 2020-04-06 MED ORDER — DILTIAZEM HCL 25 MG/5ML IV SOLN
10.0000 mg | Freq: Once | INTRAVENOUS | Status: AC
  Administered 2020-04-06: 10 mg via INTRAVENOUS
  Filled 2020-04-06: qty 5

## 2020-04-06 MED ORDER — BACLOFEN 10 MG PO TABS
10.0000 mg | ORAL_TABLET | Freq: Every day | ORAL | Status: DC
  Administered 2020-04-06 – 2020-04-11 (×6): 10 mg via ORAL
  Filled 2020-04-06 (×7): qty 1

## 2020-04-06 MED ORDER — ACETAMINOPHEN 650 MG RE SUPP: 650.0000 mg | Freq: Four times a day (QID) | RECTAL | Status: DC | PRN

## 2020-04-06 MED ORDER — DILTIAZEM HCL ER COATED BEADS 120 MG PO CP24
120.0000 mg | ORAL_CAPSULE | Freq: Every day | ORAL | Status: DC
  Administered 2020-04-06 – 2020-04-07 (×2): 120 mg via ORAL
  Filled 2020-04-06 (×2): qty 1

## 2020-04-06 MED ORDER — METHOCARBAMOL 500 MG PO TABS
750.0000 mg | ORAL_TABLET | Freq: Once | ORAL | Status: AC
  Administered 2020-04-06: 750 mg via ORAL
  Filled 2020-04-06: qty 2

## 2020-04-06 MED ORDER — APIXABAN 5 MG PO TABS
5.0000 mg | ORAL_TABLET | Freq: Two times a day (BID) | ORAL | Status: DC
  Administered 2020-04-06 – 2020-04-12 (×13): 5 mg via ORAL
  Filled 2020-04-06 (×13): qty 1

## 2020-04-06 MED ORDER — BIOTIN 5000 MCG PO TABS: 5000.0000 ug | ORAL_TABLET | Freq: Every day | ORAL | Status: DC

## 2020-04-06 MED ORDER — MECLIZINE HCL 25 MG PO TABS
12.5000 mg | ORAL_TABLET | Freq: Three times a day (TID) | ORAL | Status: DC | PRN
  Administered 2020-04-10: 12.5 mg via ORAL
  Filled 2020-04-06: qty 1

## 2020-04-06 MED ORDER — SUMATRIPTAN SUCCINATE 25 MG PO TABS
50.0000 mg | ORAL_TABLET | ORAL | Status: DC | PRN
  Filled 2020-04-06: qty 2

## 2020-04-06 NOTE — H&P (Signed)
History and Physical    PLEASE NOTE THAT DRAGON DICTATION SOFTWARE WAS USED IN THE CONSTRUCTION OF THIS NOTE.   Alicia [name] [id number] DOB: [birth year] DOA: 04/05/2020  PCP: Lauree Chandler, NP Patient coming from: home   I have personally briefly reviewed patient's old medical records in [place]  Chief Complaint: shortness of breath   HPI: Alicia [name] is a 75 y.o. female with medical history significant for muscular dystrophy, peripheral neuropathy, chronic hyponatremia with baseline serum sodium range of 1 29-1 33, who is admitted to [facility] on 04/05/2020 with new diagnosis of atrial fibrillation with RVR after presenting from home to [facility] Emergency Department complaining of shortness of breath.   The patient reports 1 week of shortness of breath associated with palpitations, and new onset nonproductive cough.  She also notes intermittent left-sided chest pressure, which she reports is nonexertional in nature.  She notes onset of the left-sided chest pressure coinciding with a sensation of palpations.  Reports that this chest pain is nonpleuritic, nonpositional, nonreproducible with palpation of the left anterior chest wall.  Denies any associated diaphoresis, nausea, vomiting, dizziness, presyncope, or syncope. denies any recent trauma, travel, surgical procedures, or periods of diminished ambulatory activity.  Denies any associated hemoptysis, calf tenderness, or lower extremity erythema.   She denies any associated orthopnea, PND, peripheral edema.  Not associate any subjective fever, chills, rigors, or generalized myalgias.  She also denies any recent neck stiffness, rhinitis, rhinorrhea, sore throat, abdominal pain, diarrhea, or rash.  Denies any recent dysuria, gross hematuria, or change in urinary urgency/frequency.  In the setting of the above symptoms, she underwent outpatient COVID-19 testing on 04/01/2020, which was found to be negative.  However,  in the setting of persistence of the above symptoms, she presented to [facility] emergency department this evening for further evaluation of the above.   She denies any known prior history of atrial fibrillation.  She reports that she is on no blood thinning agents at home, including no aspirin or formal anticoagulants.  Denies any history of GI bleed.  Per chart review, the patient underwent gated SPECT Myoview perfusion study in August 2016, which showed no evidence of reversible ischemia, representing the most recent coronary ischemic evaluation that the patient has undergone.     ED Course:  Vital signs in the ED were notable for the following: Temperature max 98.9; initial heart rate associated with the atrial fibrillation with RVR noted to be in the 150s, which is improved into the 90s to low 100s following initiation of diltiazem drip, as further described below; blood pressure initially 176/74, which is decreased to 132/81 while on diltiazem drip, respiratory rate 17-21; oxygen saturation 98 100% on room air.  Labs were notable for the following: BMP was notable for the following: Sodium 130 compared to most recent prior sodium value 131, 27 2022, potassium 4.2, bicarbonate 26, creatinine 0.51.  CBC notable for white blood cell count 6200, hemoglobin 14.4.  High-sensitivity troponin I x2 were found to be 5.  Nasopharyngeal COVID-19 PCR was performed in the ED this evening, with result currently pending.  Initial EKG showed atrial fibrillation with RVR, ventricular rate 145, no evidence of T wave changes, and evidence of ST depression in leads II, 3, aVF, V4, V5, V6, without evidence of ST elevation.  Repeat EKG following interval improvement in rate control showed atrial fibrillation with ventricular rate 96, and interval resolution of previously noted ST depression, without evidence of ST  elevation.  Chest x-ray, compared to most recent prior plain films of the chest from 12/13/2018, showed no  evidence of acute cardiopulmonary process, including no evidence of acute infiltrate, edema, pleural effusion, or pneumothorax, while evidence of COPD without interval change.   While in the ED, the following were administered: Diltiazem 10 mg IV x1 bolus, followed by initiation of diltiazem drip.     Review of Systems: As per HPI otherwise 10 point review of systems negative.   Past Medical History:  Diagnosis Date  . Bronchitis   . 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 Chronic migraine without aura, intractable, without status migrainosus   . Diverticulosis 2010  . Endometriosis   . Low sodium levels   . Malnutrition ([place])   . MD (muscular dystrophy) ([place])   . Meniere's disease, bilateral   . [facility]openia    [facility]  . Peripheral neuropathy    Dr Erling Cruz  . Pneumonia   . Sensorineural hearing loss, bilateral   . Vertigo 2019    Past Surgical History:  Procedure Laterality Date  . ABDOMINAL HYSTERECTOMY  1980 or 1981   for Endometriosis  . 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 BREAST ENHANCEMENT SURGERY Bilateral 1979  . COLONOSCOPY  01/2012   Tics; Dr Teena Irani  . ESI      X 3 in 2003 & 02/17/2011 for L4-S1 symptoms  . LUMBAR LAMINECTOMY  12/2012   W-S , [place]  . TONSILLECTOMY  1950 or 1951  . TUBAL LIGATION    . vocal cords stripped  1970    Social History:  reports that she has never smoked. She has never used smokeless tobacco. She reports previous alcohol use. She reports that she does not use drugs.   Allergies  Allergen Reactions  . Biaxin [Clarithromycin] Anaphylaxis  . Sulfonamide Derivatives Itching and Rash  . Tequin [Gatifloxacin] Anaphylaxis  . Demerol Nausea And Vomiting  . Lactose Intolerance (Gi) Other (See Comments)    Reaction:  Abdominal pain, gas, bloating  . Nsaids Other (See Comments)    Reaction:  GI upset/heartburn   . Risedronate Sodium Nausea And Vomiting and Other (See Comments)    Reaction:  GI  upset/heartburn   . Rofecoxib Nausea And Vomiting and Other (See Comments)    Reaction:  GI upset/heartburn   . Alendronate Sodium Nausea And Vomiting  . Aspirin Other (See Comments)    Reaction:  Abdominal pain   . Fish Allergy Nausea And Vomiting  . Benadryl [Diphenhydramine Hcl] Palpitations    Family History  Problem Relation Age of Onset  . Alcohol abuse Mother   . Hypertension Mother   . Diabetes Mother 12  . Stroke Mother 71  . Dementia Mother   . Kidney failure Mother   . Osteoporosis Mother   . Alzheimer's disease Father 49  . Cancer Maternal Aunt 80       pancreatic  . Arthritis Sister 34       knee replacement  . Hyperlipidemia Sister   . Lung disease Maternal Grandmother   . Lung disease Maternal Grandfather   . Hyperlipidemia Sister 48  . Other Son        recent back injury  . Other Daughter        recent back injury; knee issues when playing tennis  . Heart disease Neg Hx   . Heart attack Neg Hx      Prior to  Admission medications   Medication Sig Start Date End Date Taking? Authorizing Provider  atenolol (TENORMIN) 25 MG tablet Take 25 mg by mouth as needed (for severe tachycardia lasting more then 20 minutes).    [provider]  AZELAIC ACID EX Apply 1 application topically 2 (two) times daily.    [provider]  baclofen (LIORESAL) 10 MG tablet Take 10 mg by mouth at bedtime.    [provider]  Biotin 5000 MCG TABS Take 5,000 mcg by mouth daily.     [provider]  butalbital-acetaminophen-caffeine (FIORICET, ESGIC) 50-325-40 MG tablet Take 1 tablet by mouth every 6 (six) hours as needed (for severe pain).     [provider]  CALCIUM PO Take 400 mg by mouth 3 (three) times daily.     [provider]  Calcium-Magnesium-Vitamin D (CALCIUM MAGNESIUM PO) Take 250 mg by mouth as needed (For leg cramps).    [provider]  Carboxymethylcellulose Sodium 0.25 % SOLN Apply 1 drop to eye 3  (three) times daily.    [provider]  Cholecalciferol (VITAMIN D3) LIQD 1 drop by Does not apply route 3 (three) times a week.    [provider]  Evening Primrose Oil 1000 MG CAPS Take 1,000 mg by mouth daily at 12 noon.    [provider]  [place] 0.05 MG/24HR RING Place vaginally every 3 (three) months. 02/10/19   [provider]  gabapentin (NEURONTIN) 100 MG capsule Take 200 mg by mouth at bedtime.     [provider]  ipratropium (ATROVENT) 0.06 % nasal spray Place 2 sprays into both nostrils 2 (two) times daily as needed for rhinitis.    [provider]  lactase (LACTAID) 3000 units tablet Take 3,000 Units by mouth as needed (when eating dairy products).     [provider]  Lutein 20 MG TABS Take 20 mg by mouth daily.     [provider]  MAGNESIUM CITRATE PO Take 150 mg by mouth 2 (two) times daily.    [provider]  meclizine (ANTIVERT) 12.5 MG tablet Take 1 tablet (12.5 mg total) by mouth 3 (three) times daily as needed for dizziness. 09/14/19   Maudie Flakes, MD  methocarbamol (ROBAXIN) 750 MG tablet Take 750 mg by mouth every 6 (six) hours as needed for muscle spasms.    [provider]  metroNIDAZOLE (METROGEL) 0.75 % gel Apply 1 application topically 2 (two) times daily. Apply to face for rosacea    [provider]  Multiple Vitamin (MULTIVITAMIN WITH MINERALS) TABS tablet Take 1 tablet by mouth daily.    [provider]  Multiple Vitamins-Minerals (PRESERVISION AREDS 2) CAPS Take 1 capsule by mouth 2 (two) times daily.    [provider]  Omega-3 1000 MG CAPS Take 1 capsule by mouth 2 (two) times daily.    [provider]  ondansetron (ZOFRAN-ODT) 4 MG disintegrating tablet TAKE 1 TABLET BY MOUTH EVERY 6 HOURS AS NEEDED FOR NAUSEA/VOMIT 03/18/19   Lauree Chandler, NP  rizatriptan (MAXALT-MLT) 10 MG disintegrating tablet Take 10 mg by mouth as needed for  migraine.     [provider]     Objective    Physical Exam: Vitals:   04/06/20 0400 04/06/20 0415 04/06/20 0430 04/06/20 0445  BP: (!) 147/105 (!) 164/93 (!) 147/84 132/81  Pulse: (!) 35 63 96 100  Resp: (!) 21 (!) 22 17 18   Temp:  TempSrc:      SpO2: 98% 99% 100% 99%  Weight:      Height:        General: appears to be stated age; alert, oriented Skin: warm, dry, no rash Head:  AT/B[place] Mouth:  Oral mucosa membranes appear moist, normal dentition Neck: supple; trachea midline Heart:  Mildly tachycardic, irregular; did not appreciate any M/R/G Lungs: CTAB, did not appreciate any wheezes, rales, or rhonchi Abdomen: + BS; soft, ND, NT Vascular: 2+ pedal pulses b/l; 2+ radial pulses b/l Extremities: no peripheral edema, no muscle wasting    Labs on Admission: I have personally reviewed following labs and imaging studies  CBC: Recent Labs  Lab 04/01/20 1352 04/05/20 1726  WBC 7.7 6.2  NEUTROABS 6,483  --   HGB 14.6 14.4  HCT 43.8 41.8  MCV 91.4 90.3  PLT 262 469   Basic Metabolic Panel: Recent Labs  Lab 04/01/20 1352 04/05/20 1726  NA 131* 130*  K 4.0 4.2  CL 92* 95*  CO2 29 26  GLUCOSE 122 103*  BUN 17 16  CREATININE 0.59* 0.51  CALCIUM 9.8 9.9  MG 2.2  --    GFR: Estimated Creatinine Clearance: 45.7 mL/min (by C-G formula based on SCr of 0.51 mg/dL). Liver Function Tests: Recent Labs  Lab 04/01/20 1352  AST 21  ALT 15  BILITOT 0.5  PROT 7.2   No results for input(s): LIPASE, AMYLASE in the last 168 hours. No results for input(s): AMMONIA in the last 168 hours. Coagulation Profile: No results for input(s): INR, PROTIME in the last 168 hours. Cardiac Enzymes: No results for input(s): CKTOTAL, CKMB, CKMBINDEX, TROPONINI in the last 168 hours. BNP (last 3 results) No results for input(s): PROBNP in the last 8760 hours. HbA1C: No results for input(s): HGBA1C in the last 72 hours. CBG: No results for input(s): GLUCAP in the last  168 hours. Lipid Profile: No results for input(s): CHOL, HDL, LDLCALC, TRIG, CHOLHDL, LDLDIRECT in the last 72 hours. Thyroid Function Tests: No results for input(s): TSH, T4TOTAL, FREET4, T3FREE, THYROIDAB in the last 72 hours. Anemia Panel: No results for input(s): VITAMINB12, FOLATE, FERRITIN, TIBC, IRON, RETICCTPCT in the last 72 hours. Urine analysis:    Component Value Date/Time   COLORURINE YELLOW 10/24/2015 [place] 10/24/2015 1153   LABSPEC 1.009 10/24/2015 1153   PHURINE 7.5 10/24/2015 1153   GLUCOSEU NEGATIVE 10/24/2015 1153   HGBUR NEGATIVE 10/24/2015 1153   HGBUR negative 10/23/2006 1425   BILIRUBINUR NEGATIVE 10/24/2015 1153   KETONESUR NEGATIVE 10/24/2015 1153   PROTEINUR NEGATIVE 10/24/2015 1153   UROBILINOGEN 0.2 10/15/2014 1830   NITRITE NEGATIVE 10/24/2015 1153   LEUKOCYTESUR NEGATIVE 10/24/2015 1153    Radiological Exams on Admission: DG Chest 2 View  Result Date: 04/05/2020 CLINICAL DATA:  Left-sided chest pain, cough, and fatigue for 1 week. EXAM: CHEST - 2 VIEW COMPARISON:  12/13/2018 FINDINGS: The heart size and mediastinal contours are within normal limits. Aortic atherosclerotic calcification noted. Bilateral upper lobe pleural-parenchymal scarring remains stable. Pulmonary hyperinflation is again noted. No evidence of acute infiltrate or edema. No evidence of pleural effusion. Bilateral breast implants again noted. IMPRESSION: COPD and bilateral upper lobe pleural-parenchymal scarring. No active lung disease. Electronically Signed   By: Marlaine Hind M.D.   On: 04/05/2020 18:26     EKG: Independently reviewed, with result as described above.    Assessment/Plan   Alicia [name] is a 75 y.o. female with medical history significant for muscular dystrophy,  peripheral neuropathy, chronic hyponatremia with baseline serum sodium range of 1 29-1 33, who is admitted to [facility] on 04/05/2020 with new diagnosis of atrial fibrillation  with RVR after presenting from home to [facility] Emergency Department complaining of shortness of breath.    Principal Problem:   Atrial fibrillation with RVR (HCC) Active Problems:   Muscular dystrophy (HCC)   Chronic hyponatremia   Atypical chest pain     #) Atrial fibrillation with RVR: In the setting of no known history of atrial fibrillation, the patient presents today in atrial fibrillation with ventricular rates in the 150's, which appears associated with her presenting 1 week of shortness of breath with palpitations, and left-sided chest pressure, which is improved with improving rate control. Of note, blood pressure tolerated the associated heart rates in the 150s, rate control is improved following initiation of diltiazem drip in the ED, with ensuing ventricular rates in the 90s to 110s, with associated toleration from a blood pressure standpoint.  Given no prior diagnosis of atrial fibrillation, duration over which she has been experiencing this is currently unclear.  No obvious exacerbating factors at this time, including no evidence of overt infectious process, although COVID-19 PCR performed in the ED this evening is currently pending after having undergone negative COVID-19 testing as an outpatient on 04/01/2018.  Serial EKGs performed in the ED today are suggestive of rate related ischemia inferior lateral leads, with resolution of such following improvement in rate control, as further outlined above in the absence of any associated ST elevation.  It appears that most recent echocardiogram occurred in 2014. In the setting of a CHA2DS2-VASc score of 4, there is an indication for the patient to be on chronic anticoagulation for thromboembolic prophylaxis, warranting further discussions with the patient regarding indications versus risks versus benefits versus alternatives to initiation of chronic anticoagulation for this purpose.  We will continue on diltiazem drip for now, with plan to start an  oral AV nodal blocking agent later this morning before discontinuation of this drip.  Of note, she recently had TSH checked as an outpatient, which was found to be within normal limits.    Plan: Monitor strict I's and O's and daily weights. Monitor on telemetry. Check serum magnesium level with prn supplementation to maintain levels of greater than or equal to 2.0. Repeat BMP in the AM.  Repeat CBC in the AM.  Check urinalysis.  Monitor for results of nasopharyngeal COVID-19 PCR performed in the ED this evening.  Continue diltiazem drip, with plan to transition to oral AV nodal blocking agent later this morning, as prescribed above, anticipate additional discussions with the patient regarding indications risk versus benefits result Stann Mainland initiation of chronic anticoagulation, as above.  Once improvement in rate control, would likely benefit from echocardiogram.  Check urinary drug screen.      #) Atypical chest pain: 1 week intermittent left-sided chest pressure that has coincided with her palpitations, and has been nonexertional, but is improved following improvement in rate control.  This is in the context of serial EKGs performed in the ED this evening showing suggestion of rate related ischemia in the inferior lateral leads, as further outlined above.  Of note, in spite of experiencing the symptoms for the course of the last week, high-sensitivity troponin I x2 been nonelevated.  Overall, ACS is felt to be less likely, although presentation may warrant consideration for additional coronary ischemic evaluation, particularly given the most recent such evaluation occurred in 2016.  Clinically, presentation  of failure is less consistent with acute PE at this time.  Chest x-ray shows no evidence of acute cardiopulmonary process, as above.  Plan: Evaluation management of presenting atrial fibrillation with RVR, as above.  Consider echocardiogram in the setting of notably new diagnosis of atrial  fibrillation with RVR, but also to assess for any focal wall motion abnormalities.  We will repeat third set of heart enzymes this morning.  Add on serum magnesium level.  As needed EKG for any additional chest pain.  Full dose aspirin x1.  Monitor on telemetry.     #) Chronic hyponatremia: Associated with serum sodium range of 1 29-1 33 dating back to January 2020.  Presenting symptoms sodium of 131 found within this baseline range.  Given the chronicity and stability of these findings, there does not appear to be an indication for additional inpatient evaluation of this process.  No clinical or radiographic evidence of acute decompensated heart failure.   Plan: Monitor strict I's and O's and daily weights.  Repeat BMP in the morning.     #) Muscular dystrophy: takes baclofen as well as methocarbamol as an outpatient.  Plan: Continue home baclofen and methocarbamol .      DVT prophylaxis: scd's  Code Status: Full code Family Communication: none Disposition Plan: Per Rounding Team Consults called: none  Admission status: Inpatient; PCU     Of note, this patient was added by me to the following Admit List/Treatment Team:  mcadmits     PLEASE NOTE THAT DRAGON DICTATION SOFTWARE WAS USED IN THE CONSTRUCTION OF THIS NOTE.   Rhetta Mura DO [facility] Pager [phone] From [place]  04/06/2020, 5:29 AM

## 2020-04-06 NOTE — ED Notes (Signed)
Pt reports increase in palpitations and sob. Rechecked VS and repeat EKG ordered.

## 2020-04-06 NOTE — ED Notes (Signed)
Lunch Tray Ordered @ 1034. 

## 2020-04-06 NOTE — ED Notes (Signed)
Dinner Tray Ordered @ 1651. 

## 2020-04-06 NOTE — ED Provider Notes (Signed)
[facility] EMERGENCY DEPARTMENT Provider Note   CSN: [id number] Arrival date & time: 04/05/20  1705     History Chief Complaint  Patient presents with  . Cough  . Weakness    Alicia [name] is a 75 y.o. female.  The history is provided by the patient and the spouse.  Palpitations Palpitations quality:  Fast Onset quality:  Sudden Timing:  Intermittent Progression:  Worsening Chronicity:  New Relieved by:  None tried Worsened by:  Nothing Associated symptoms: chest pain, cough and shortness of breath   Patient with history of muscular dystrophy, malnutrition presents with multiple complaints.  She reports approximately 1 week ago she began having a cough.  She was seen by her PCP and had negative Covid test.  Over the past 2 to 3 days she has been having episodes of left-sided chest pain and pressure/squeezing.  The chest pain appears worse with exertion.  She also reports shortness of breath.  Patient also reports increasing palpitations over the past several days.  She reports a long history of palpitations but these appear to be more severe She denies any known history of atrial fibrillation.  She has not seen cardiology in years because they are all retired.  HPI: A 75 year old patient presents for evaluation of chest pain. Initial onset of pain was more than 6 hours ago. The patient's chest pain is described as heaviness/pressure/tightness and is worse with exertion. The patient's chest pain is middle- or left-sided, is not well-localized, is not sharp and does not radiate to the arms/jaw/neck. The patient does not complain of nausea and denies diaphoresis. The patient has no history of stroke, has no history of peripheral artery disease, has not smoked in the past 90 days, denies any history of treated diabetes, has no relevant family history of coronary artery disease (first degree relative at less than age 73), is not hypertensive, has no history of  hypercholesterolemia and does not have an elevated BMI (>=30).   Past Medical History:  Diagnosis Date  . Bronchitis   . 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 Chronic migraine without aura, intractable, without status migrainosus   . Diverticulosis 2010  . Endometriosis   . Low sodium levels   . Malnutrition ([place])   . MD (muscular dystrophy) ([place])   . Meniere's disease, bilateral   . [facility]  . Peripheral neuropathy    Dr Erling Cruz  . Pneumonia   . Sensorineural hearing loss, bilateral   . Vertigo 2019    Patient Active Problem List   Diagnosis Date Noted  . Acute bronchitis 12/21/2015  . Chest pain 10/19/2014  . Carotid stenosis 10/19/2014  . Diverticulosis of colon without hemorrhage 08/20/2012  . Heart palpitations 07/04/2012  . Hyponatremia 07/04/2012  . Lumbar radiculopathy 06/03/2012  . 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 Muscular dystrophy ([place]) 11/23/2011  . PARESTHESIA 11/11/2009  . PERIPHERAL NEUROPATHY 01/29/2008  . Disorder of bone and cartilage 01/27/2008  . SKIN CANCER, HX OF 01/27/2008  . GOITER, NONTOXIC MULTINODULAR 10/23/2006  . HYPERLIPIDEMIA NEC/NOS 10/23/2006  . FIBROMYALGIA 10/23/2006    Past Surgical History:  Procedure Laterality Date  . ABDOMINAL HYSTERECTOMY  1980 or 1981   for Endometriosis  . 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 BREAST ENHANCEMENT SURGERY Bilateral 1979  . COLONOSCOPY  01/2012   Tics; Dr Teena Irani  . ESI      X 3  in 2003 & 02/17/2011 for L4-S1 symptoms  . LUMBAR LAMINECTOMY  12/2012   [facility] , [place]  . TONSILLECTOMY  1950 or 1951  . TUBAL LIGATION    . vocal cords stripped  1970     OB History   No obstetric history on file.     Family History  Problem Relation Age of Onset  . Alcohol abuse Mother   . Hypertension Mother   . Diabetes Mother 69  . Stroke Mother 14  . Dementia Mother   . Kidney failure Mother   . Osteoporosis Mother   . Alzheimer's disease  Father 61  . Cancer Maternal Aunt 80       pancreatic  . Arthritis Sister 46       knee replacement  . Hyperlipidemia Sister   . Lung disease Maternal Grandmother   . Lung disease Maternal Grandfather   . Hyperlipidemia Sister 2  . Other Son        recent back injury  . Other Daughter        recent back injury; knee issues when playing tennis  . Heart disease Neg Hx   . Heart attack Neg Hx     Social History   Tobacco Use  . Smoking status: Never Smoker  . Smokeless tobacco: Never Used  Vaping Use  . Vaping Use: Never used  Substance Use Topics  . Alcohol use: Not Currently  . Drug use: No    Home Medications Prior to Admission medications   Medication Sig Start Date End Date Taking? Authorizing Provider  atenolol (TENORMIN) 25 MG tablet Take 25 mg by mouth as needed (for severe tachycardia lasting more then 20 minutes).    [provider]  AZELAIC ACID EX Apply 1 application topically 2 (two) times daily.    [provider]  baclofen (LIORESAL) 10 MG tablet Take 10 mg by mouth at bedtime.    [provider]  Biotin 5000 MCG TABS Take 5,000 mcg by mouth daily.     [provider]  butalbital-acetaminophen-caffeine (FIORICET, ESGIC) 50-325-40 MG tablet Take 1 tablet by mouth every 6 (six) hours as needed (for severe pain).     [provider]  CALCIUM PO Take 400 mg by mouth 3 (three) times daily.     [provider]  Calcium-Magnesium-Vitamin D (CALCIUM MAGNESIUM PO) Take 250 mg by mouth as needed (For leg cramps).    [provider]  Carboxymethylcellulose Sodium 0.25 % SOLN Apply 1 drop to eye 3 (three) times daily.    [provider]  Cholecalciferol (VITAMIN D3) LIQD 1 drop by Does not apply route 3 (three) times a week.    [provider]  Evening Primrose Oil 1000 MG CAPS Take 1,000 mg by mouth daily at 12 noon.    [provider]  Toco 0.05 MG/24HR RING Place vaginally every  3 (three) months. 02/10/19   [provider]  gabapentin (NEURONTIN) 100 MG capsule Take 200 mg by mouth at bedtime.     [provider]  ipratropium (ATROVENT) 0.06 % nasal spray Place 2 sprays into both nostrils 2 (two) times daily as needed for rhinitis.    [provider]  lactase (LACTAID) 3000 units tablet Take 3,000 Units by mouth as needed (when eating dairy products).     [provider]  Lutein 20 MG TABS Take 20 mg by mouth daily.     [provider]  MAGNESIUM CITRATE PO Take 150 mg  by mouth 2 (two) times daily.    [provider]  meclizine (ANTIVERT) 12.5 MG tablet Take 1 tablet (12.5 mg total) by mouth 3 (three) times daily as needed for dizziness. 09/14/19   Maudie Flakes, MD  methocarbamol (ROBAXIN) 750 MG tablet Take 750 mg by mouth every 6 (six) hours as needed for muscle spasms.    [provider]  metroNIDAZOLE (METROGEL) 0.75 % gel Apply 1 application topically 2 (two) times daily. Apply to face for rosacea    [provider]  Multiple Vitamin (MULTIVITAMIN WITH MINERALS) TABS tablet Take 1 tablet by mouth daily.    [provider]  Multiple Vitamins-Minerals (PRESERVISION AREDS 2) CAPS Take 1 capsule by mouth 2 (two) times daily.    [provider]  Omega-3 1000 MG CAPS Take 1 capsule by mouth 2 (two) times daily.    [provider]  ondansetron (ZOFRAN-ODT) 4 MG disintegrating tablet TAKE 1 TABLET BY MOUTH EVERY 6 HOURS AS NEEDED FOR NAUSEA/VOMIT 03/18/19   Lauree Chandler, NP  rizatriptan (MAXALT-MLT) 10 MG disintegrating tablet Take 10 mg by mouth as needed for migraine.     [provider]    Allergies    Biaxin [clarithromycin], Sulfonamide derivatives, Tequin [gatifloxacin], Demerol, Lactose intolerance (gi), Nsaids, Risedronate sodium, Rofecoxib, Alendronate sodium, Aspirin, Fish allergy, and Benadryl [diphenhydramine hcl]  Review of Systems   Review of  Systems  Constitutional: Negative for fever.  Respiratory: Positive for cough and shortness of breath.   Cardiovascular: Positive for chest pain and palpitations.  Musculoskeletal:       Chronic back pain  All other systems reviewed and are negative.   Physical Exam Updated Vital Signs BP 132/81   Pulse 100   Temp 98.9 F (37.2 C)   Resp 18   Ht 1.676 m (5\' 6" )   Wt 47.6 kg   SpO2 99%   BMI 16.95 kg/m   Physical Exam CONSTITUTIONAL: Elderly and frail HEAD: Normocephalic/atraumatic EYES: EOMI/PERRL ENMT: Mucous membranes moist NECK: supple no meningeal signs SPINE/BACK:entire spine nontender CV: Tachycardic and irregular LUNGS: Lungs are clear to auscultation bilaterally, no apparent distress ABDOMEN: soft, nontender, no rebound or guarding, bowel sounds noted throughout abdomen GU:no cva tenderness NEURO: Pt is awake/alert/appropriate, moves all extremitiesx4.  No facial droop.   EXTREMITIES: pulses normal/equal, full ROM, no calf tenderness or edema SKIN: warm, color normal PSYCH: Anxious  ED Results / Procedures / Treatments   Labs (all labs ordered are listed, but only abnormal results are displayed) Labs Reviewed  BASIC METABOLIC PANEL - Abnormal; Notable for the following components:      Result Value   Sodium 130 (*)    Chloride 95 (*)    Glucose, Bld 103 (*)    All other components within normal limits  SARS CORONAVIRUS 2 BY RT PCR (HOSPITAL ORDER, [facility])  CBC  TROPONIN I (HIGH SENSITIVITY)  TROPONIN I (HIGH SENSITIVITY)    EKG EKG Interpretation  Date/Time:  Tuesday April 06 2020 04:02:30 EST Ventricular Rate:  142 PR Interval:    QRS Duration: 80 QT Interval:  300 QTC Calculation: 445 R Axis:   57 Text Interpretation: Atrial fibrillation Ventricular premature complex Repolarization abnormality, prob rate related Confirmed by Ripley Fraise [phone]) on 04/06/2020 4:03:44 AM Also confirmed by Ripley Fraise  [phone])  on 04/06/2020 4:05:49 AM   Radiology DG Chest 2 View  Result Date: 04/05/2020 CLINICAL DATA:  Left-sided chest pain, cough, and fatigue for  1 week. EXAM: CHEST - 2 VIEW COMPARISON:  12/13/2018 FINDINGS: The heart size and mediastinal contours are within normal limits. Aortic atherosclerotic calcification noted. Bilateral upper lobe pleural-parenchymal scarring remains stable. Pulmonary hyperinflation is again noted. No evidence of acute infiltrate or edema. No evidence of pleural effusion. Bilateral breast implants again noted. IMPRESSION: COPD and bilateral upper lobe pleural-parenchymal scarring. No active lung disease. Electronically Signed   By: Danae Orleans M.D.   On: 04/05/2020 18:26    Procedures .Critical Care Performed by: Zadie Rhine, MD Authorized by: Zadie Rhine, MD   Critical care provider statement:    Critical care time (minutes):  56   Critical care start time:  04/06/2020 4:00 AM   Critical care end time:  04/06/2020 4:56 AM   Critical care time was exclusive of:  Separately billable procedures and treating other patients   Critical care was necessary to treat or prevent imminent or life-threatening deterioration of the following conditions:  Cardiac failure and circulatory failure   Critical care was time spent personally by me on the following activities:  Discussions with consultants, development of treatment plan with patient or surrogate, obtaining history from patient or surrogate, re-evaluation of patient's condition, pulse oximetry, ordering and review of radiographic studies, ordering and review of laboratory studies and review of old charts   I assumed direction of critical care for this patient from another provider in my specialty: no     Care discussed with: admitting provider       Medications Ordered in ED Medications  diltiazem (CARDIZEM) 125 mg in dextrose 5% 125 mL (1 mg/mL) infusion (5 mg/hr Intravenous New Bag/Given 04/06/20 0415)   butalbital-acetaminophen-caffeine (FIORICET) 50-325-40 MG per tablet 1 tablet (has no administration in time range)  diltiazem (CARDIZEM) injection 10 mg (10 mg Intravenous Given 04/06/20 0415)    ED Course  I have reviewed the triage vital signs and the nursing notes.  Pertinent labs & imaging results that were available during my care of the patient were reviewed by me and considered in my medical decision making (see chart for details).    MDM Rules/Calculators/A&P HEAR Score: 4                        04:00AM Patient presents with cough, chest pain and squeezing palpitations.  The palpitations is worsened over the past several days, but she reports she has had them for quite some time. Patient found to be in atrial fibrillation with rapid ventricular rate. Patient has been given Cardizem with improvement of her heart rate. However its unclear when these episodes of A. fib began as she reports increased palpitations for several days.  She is also been having squeezing type pressure/chest pain At this point I do not feel she is a candidate for emergent cardioversion  4:57 AM Discussed with Dr. Marilynn Rail for admission  This patients CHA2DS2-VASc Score and unadjusted Ischemic Stroke Rate (% per year) is equal to 3.2 % stroke rate/year from a score of 3  Above score calculated as 1 point each if present [CHF, HTN, DM, Vascular=MI/PAD/Aortic Plaque, Age if 65-74, or Female] Above score calculated as 2 points each if present [Age > 75, or Stroke/TIA/TE]   Final Clinical Impression(s) / ED Diagnoses Final diagnoses:  Cough  Atrial fibrillation with rapid ventricular response [facility])    Rx / DC Orders ED Discharge Orders    None       Zadie Rhine, MD 04/06/20 [phone]

## 2020-04-06 NOTE — Progress Notes (Signed)
Echocardiogram 2D Echocardiogram has been performed.  Oneal Deputy Asa Fath 04/06/2020, 2:16 PM

## 2020-04-06 NOTE — Progress Notes (Signed)
Patient seen and examined personally, I reviewed the chart, history and physical and admission note, done by admitting physician this morning and agree with the same with following addendum.  Please refer to the morning admission note for more detailed plan of care.  Briefly, 46 old female with history of muscular dystrophy, peripheral neuropathy, chronic hyponatremia 120 [phone] presented to the ED with 1 week of shortness of breath with palpitation, nonproductive cough intermittent left-sided chest pressure. She was seen in the ED found to have new onset A. fib with RVR. Labs showed hyponatremia 130, troponin negative x2 COVID-19 negative, EKG A. fib with RVR, chest x-ray no acute cardiopulmonary process, had COPD and bilateral upper lobe pleural parenchymal scarring changes. Patient was placed on Cardizem drip and admitted. She is very hard of hearing but denies any complaint this morning.  She is on Cardizem drip. A. fib with RVRm recent TSH Nl, ,checking echocardiogram, cards consulted, start DOAC 2/2 chads2vasc score of at-least 3. Atypical chest pain: Serial troponins are negative suspect in the setting of A. fib, check echo Chronic hyponatremia, at baseline. Wants to take regular diet-has been on very low salt diet at home due to blood pressure she says. Muscular dystrophy on baclofen/Robaxin-resumed.  Son Veryl Speak [phone]. Updated Cardiology to udpate son.

## 2020-04-06 NOTE — ED Notes (Signed)
Pt transported to echo 

## 2020-04-06 NOTE — ED Notes (Signed)
Help patient get straighten up in the bed and set patient lunch tray up patient is sitting up eating

## 2020-04-06 NOTE — Progress Notes (Signed)
ANTICOAGULATION CONSULT NOTE - Initial Consult  Pharmacy Consult for Eliquis Indication: atrial fibrillation  Allergies  Allergen Reactions  . Biaxin [Clarithromycin] Anaphylaxis  . Sulfonamide Derivatives Itching and Rash  . Tequin [Gatifloxacin] Anaphylaxis  . Demerol Nausea And Vomiting  . Lactose Intolerance (Gi) Other (See Comments)    Abdominal pain, gas, bloating  . Nsaids Other (See Comments)    Reaction:  GI upset/heartburn   . Risedronate Sodium Nausea And Vomiting and Other (See Comments)     GI upset/heartburn   . Rofecoxib Nausea And Vomiting and Other (See Comments)    GI upset/heartburn   . Alendronate Sodium Nausea And Vomiting  . Aspirin Other (See Comments)    Abdominal pain   . Fish Allergy Nausea And Vomiting    Can take fish oil  . Benadryl [Diphenhydramine Hcl] Palpitations    Patient Measurements: Height: 5\' 6"  (167.6 cm) Weight: 47.6 kg (105 lb) IBW/kg (Calculated) : 59.3  Vital Signs: BP: 145/79 (02/01 1447) Pulse Rate: 80 (02/01 1455)  Labs: Recent Labs    04/05/20 1726 04/05/20 2126 04/06/20 0703  HGB 14.4  --  13.8  HCT 41.8  --  39.7  PLT 257  --  228  CREATININE 0.51  --  0.48  TROPONINIHS 5 5 8     Estimated Creatinine Clearance: 45.7 mL/min (by C-G formula based on SCr of 0.48 mg/dL).   Medical History: Past Medical History:  Diagnosis Date  . Bronchitis   . 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 Chronic migraine without aura, intractable, without status migrainosus   . Diverticulosis 2010  . Endometriosis   . Low sodium levels   . Malnutrition ([place])   . MD (muscular dystrophy) ([place])   . Meniere's disease, bilateral   . [facility]  . Peripheral neuropathy    Dr Erling Cruz  . Pneumonia   . Sensorineural hearing loss, bilateral   . Vertigo 2019     Assessment: 75 yo found to have new Afib with RVR. CHADS2-VASc score of 3 (AGEx2, female). Pharmacy asked to start Eliquis. No AC noted PTA. Age <80  years, Scr <1.5. CBC wnl, no overt bleeding noted.  Goal of Therapy:  Monitor platelets by anticoagulation protocol: Yes   Plan:  Start Eliquis 5 mg BID Monitor cbc, s/sx bleeding  Richardine Service, PharmD, BCPS PGY2 Cardiology Pharmacy Resident Phone: [phone] 04/06/2020  3:35 PM  Please check [email or web address] for unit-specific pharmacy phone numbers.

## 2020-04-06 NOTE — Consult Note (Addendum)
Cardiology Consultation:   Patient ID: Alicia [name] MRN: [id number]; DOB: [birth year]  Admit date: 04/05/2020 Date of Consult: 04/06/2020  Primary Care Provider: Lauree Chandler, NP [facility] Cardiologist: New (previously Dr. Mare Ferrari)   Patient Profile:   Alicia [name] is a 75 y.o. female with a hx of palpitations, symptomatic PACs, chronic hyponatremia, carotid artery disease, meniere's disease, muscular dystrophy and deafness who is being seen today for the evaluation of new onset atrial fibrillation with rapid ventricular rate at the request of Dr. Lupita Leash.   Previously followed by Dr. Mare Ferrari for symptomatic PACs/palpitation.  Lexiscan Myoview August 2016 without evidence of ischemia.  Prior monitor without evidence of arrhythmia.  Last seen 10/2014.  Last Carotid Doppler showed 1 to 39% left ICA and 40-59% right ICA stenosis.  History of Present Illness:   Alicia [name] was in usual state of health up until last Monday, January 24 when she noted sudden onset of shortness of breath and palpitation.  Due to ongoing symptoms she was seen by provider at [facility].  She was in sinus rhythm at that time by EKG.  Covid was negative.  Sent home.  That night she started to having worsening shortness of breath and palpitation.  It was associated with severe chest pressure and fatigue.  Due to persistent symptoms she came to ER yesterday for further evaluation.  She was in sinus rhythm on initial evaluation however while in triage waiting for bed she started to having palpitation again.  She was found to have atrial fibrillation with rapid ventricular rate IV Cardizem and then started on drip.  Symptoms improved and she eventually converted to sinus rhythm this morning.  Patient reports longstanding history of intermittent palpitation lasting for few minutes.  She was using as needed atenolol.  No prior documented atrial fibrillation.  Denies history of CAD, stroke, hypertension or  diabetes mellitus.  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  She reports intermittent dizziness since got her first dose of Covid vaccine February 2021.  Denies syncope or melena.  Troponin negative x2.  Covid negative.  Serum creatinine normal.  Potassium 4.6.  Sodium 134.  Urine drug screen clear.   Past Medical History:  Diagnosis Date  . Bronchitis   . 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 Chronic migraine without aura, intractable, without status migrainosus   . Diverticulosis 2010  . Endometriosis   . Low sodium levels   . Malnutrition ([place])   . MD (muscular dystrophy) ([place])   . Meniere's disease, bilateral   . [facility]openia    [facility]  . Peripheral neuropathy    Dr Erling Cruz  . Pneumonia   . Sensorineural hearing loss, bilateral   . Vertigo 2019    Past Surgical History:  Procedure Laterality Date  . ABDOMINAL HYSTERECTOMY  1980 or 1981   for Endometriosis  . 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 BREAST ENHANCEMENT SURGERY Bilateral 1979  . COLONOSCOPY  01/2012   Tics; Dr Teena Irani  . ESI      X 3 in 2003 & 02/17/2011 for L4-S1 symptoms  . LUMBAR LAMINECTOMY  12/2012   W-S , [place]  . TONSILLECTOMY  1950 or 1951  . TUBAL LIGATION    . vocal cords stripped  1970   Inpatient Medications: Scheduled Meds: . baclofen  10 mg Oral QHS  . calcium carbonate  1 tablet Oral BID WC  . gabapentin  200 mg Oral QHS   Continuous Infusions: . diltiazem (CARDIZEM) infusion 5 mg/hr (04/06/20 0415)   PRN Meds: acetaminophen **OR** acetaminophen, butalbital-acetaminophen-caffeine, meclizine, methocarbamol, SUMAtriptan  Allergies:    Allergies  Allergen Reactions  . Biaxin [Clarithromycin] Anaphylaxis  . Sulfonamide Derivatives Itching and Rash  . Tequin [Gatifloxacin] Anaphylaxis  . Demerol Nausea And Vomiting  . Lactose Intolerance (Gi) Other (See Comments)    Abdominal pain, gas,  bloating  . Nsaids Other (See Comments)    Reaction:  GI upset/heartburn   . Risedronate Sodium Nausea And Vomiting and Other (See Comments)     GI upset/heartburn   . Rofecoxib Nausea And Vomiting and Other (See Comments)    GI upset/heartburn   . Alendronate Sodium Nausea And Vomiting  . Aspirin Other (See Comments)    Abdominal pain   . Fish Allergy Nausea And Vomiting    Can take fish oil  . Benadryl [Diphenhydramine Hcl] Palpitations   Social History:   Social History   Socioeconomic History  . Marital status: Married    Spouse name: Not on file  . Number of children: Not on file  . Years of education: Not on file  . Highest education level: Not on file  Occupational History  . Not on file  Tobacco Use  . Smoking status: Never Smoker  . Smokeless tobacco: Never Used  Vaping Use  . Vaping Use: Never used  Substance and Sexual Activity  . Alcohol use: Not Currently  . Drug use: No  . Sexual activity: Not Currently  Other Topics Concern  . Not on file  Social History Narrative   Social History      Diet? Trying to gain weight      Do you drink/eat things with caffeine? 1 mug hot tea with breakfast per day      Marital status?  Married 90 years                                  What year were you married? 1970      Do you live in a house, apartment, assisted living, condo, trailer, etc.? House (46 years)      Is it one or more stories? 2 stories and walk up finished attic      How many persons live in your home? 2      Do you have any pets in your home? (please list) none      Highest level of education completed? 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     yes                                 Type & how often? Walk in neighborhood 4 - 6 times per week if possible      Advanced Directives (Lawyer just beginning to work on these things)      Do you  have a living will?      Do you have a DNR form?                    no  If not, do you want to discuss one?      Do you have signed POA/HPOA for forms?       Functional Status      Do you have difficulty bathing or dressing yourself? no      Do you have difficulty preparing food or eating? no      Do you have difficulty managing your medications? no      Do you have difficulty managing your finances? no      Do you have difficulty affording your medications? no   Social Determinants of Health   Financial Resource Strain: Not on file  Food Insecurity: Not on file  Transportation Needs: Not on file  Physical Activity: Not on file  Stress: Not on file  Social Connections: Not on file  Intimate Partner Violence: Not on file    Family History:   Family History  Problem Relation Age of Onset  . Alcohol abuse Mother   . Hypertension Mother   . Diabetes Mother 64  . Stroke Mother 79  . Dementia Mother   . Kidney failure Mother   . Osteoporosis Mother   . Alzheimer's disease Father 50  . Cancer Maternal Aunt 80       pancreatic  . Arthritis Sister 35       knee replacement  . Hyperlipidemia Sister   . Lung disease Maternal Grandmother   . Lung disease Maternal Grandfather   . Hyperlipidemia Sister 65  . Other Son        recent back injury  . Other Daughter        recent back injury; knee issues when playing tennis  . Heart disease Neg Hx   . Heart attack Neg Hx     ROS:  Please see the history of present illness. All other ROS reviewed and negative.     Physical Exam/Data:   Vitals:   04/06/20 1115 04/06/20 1130 04/06/20 1200 04/06/20 1215  BP:  (!) 145/71 123/65 113/65  Pulse: 70     Resp: 15 19 20 20   Temp:      TempSrc:      SpO2: 93%     Weight:      Height:       No intake or output data in the 24 hours ending 04/06/20 1317 Last 3 Weights 04/05/2020 04/01/2020 09/14/2019  Weight (lbs) 105 lb 106 lb 12.8 oz 108 lb  Weight (kg) 47.628 kg  48.444 kg 48.988 kg     Body mass index is 16.95 kg/m.  General:  Thin frail elderly female in no acute distress HEENT: normal Lymph: no adenopathy Neck: no JVD Endocrine:  No thryomegaly Vascular: No carotid bruits; FA pulses 2+ bilaterally without bruits  Cardiac:  normal S1, S2; RRR; no murmur  Lungs:  clear to auscultation bilaterally, no wheezing, rhonchi or rales  Abd: soft, nontender, no hepatomegaly  Ext: no edema Musculoskeletal:  No deformities, BUE and BLE strength normal and equal Skin: warm and dry  Neuro:  CNs 2-12 intact, no focal abnormalities noted Psych:  Normal affect   EKG:  The EKG was personally reviewed and demonstrates: Atrial fibrillation, rate 145 bpm, ST upsloping in inferior leads Telemetry:  Telemetry was personally reviewed and demonstrates: Sinus rhythm, rate in the 80s, frequent PVCs  Relevant CV Studies:  Stress test 10/2014 The ejection fraction is greater than 70%. Wall motion is normal. The nuclear images including the raw data shows evidence of shifting breast attenuation. There  is no scar or ischemia. There is no diagnostic abnormality. This is a low risk scan.  Monitor 10/2014 48 hour Holter monitor shows normal sinus rhythm with frequent premature atrial beats.  Occasional PVCs.  No ventricular tachycardia.  No significant bradycardia episodes.  Occasional short runs of 7 or 8 beats of SVT at a rate of 100-110. No significant or serious arrhythmias were noted.  Laboratory Data:  High Sensitivity Troponin:   Recent Labs  Lab 04/05/20 1726 04/05/20 2126 04/06/20 0703  TROPONINIHS 5 5 8      Chemistry Recent Labs  Lab 04/01/20 1352 04/05/20 1726 04/06/20 0703  NA 131* 130* 134*  K 4.0 4.2 4.6  CL 92* 95* 101  CO2 29 26 24   GLUCOSE 122 103* 98  BUN 17 16 8   CREATININE 0.59* 0.51 0.48  CALCIUM 9.8 9.9 9.0  GFRNONAA 90 >60 >60  GFRAA 104  --   --   ANIONGAP  --  9 9    Recent Labs  Lab 04/01/20 1352  PROT 7.2  AST 21   ALT 15  BILITOT 0.5   Hematology Recent Labs  Lab 04/01/20 1352 04/05/20 1726 04/06/20 0703  WBC 7.7 6.2 6.0  RBC 4.79 4.63 4.42  HGB 14.6 14.4 13.8  HCT 43.8 41.8 39.7  MCV 91.4 90.3 89.8  MCH 30.5 31.1 31.2  MCHC 33.3 34.4 34.8  RDW 11.8 13.2 13.4  PLT 262 257 228    Radiology/Studies:  DG Chest 2 View  Result Date: 04/05/2020 CLINICAL DATA:  Left-sided chest pain, cough, and fatigue for 1 week. EXAM: CHEST - 2 VIEW COMPARISON:  12/13/2018 FINDINGS: The heart size and mediastinal contours are within normal limits. Aortic atherosclerotic calcification noted. Bilateral upper lobe pleural-parenchymal scarring remains stable. Pulmonary hyperinflation is again noted. No evidence of acute infiltrate or edema. No evidence of pleural effusion. Bilateral breast implants again noted. IMPRESSION: COPD and bilateral upper lobe pleural-parenchymal scarring. No active lung disease. Electronically Signed   By: Marlaine Hind M.D.   On: 04/05/2020 18:26     Assessment and Plan:   1. Atrial fibrillation with rapid ventricular rate Patient with longstanding history of palpitation and symptomatic PVCs.  No prior arrhythmias on monitor.  She was taking as needed Atenolol with improved symptoms.  Current symptoms started last week with intermittent palpitation, shortness of breath, fatigue and chest discomfort. -She was started on Cardizem with eventual conversion to sinus rhythm this morning.  Currently maintaining sinus rhythm with frequent PVCs on Cardizem 5mg /hr.  - TSH normal on 1/27 - Pending echo  CHA2DS2-VASc Score = 3  This indicates a 3.2% annual risk of stroke.  CHF History: No HTN History: No Diabetes History: No Stroke History: No Vascular Disease History: No Age Score: 2 Gender Score: 1  She will need long term anticoagulation. Start Eliquis 5mg  BID.  She is symptomatic with afib and has rate related EKG changes. Troponin negative. Goal is keep in sinus rhythm. Pending echo.    2. Chronic hyponatremia - Na 134 (improved from baseline)  3. Chest pain and abnormal EKG - Inferior leads ST up sloping with Rate of 145, resolved with improved rate - Likely rate related - Troponin negative - Pending echo  For questions or updates, please contact [facility] Please consult [email or web address] for contact info under   Jarrett Soho, PA  04/06/2020 1:17 PM    The patient was seen, examined and discussed with Bhagat,Bhavinkumar PA-C and I agree with the above.  75 y.o. female with a hx of palpitations, symptomatic PACs, chronic hyponatremia, carotid artery disease, meniere's disease, muscular dystrophy and deafness, who presented with sudden onset of shortness of breath and palpitation associated with severe chest pressure and fatigue. She was found to be in new onset atrial fibrillation with rapid ventricular rate, she was started on IV Cardizem and then started on drip.  Symptoms improved and she eventually converted to sinus rhythm this morning.  Patient reports longstanding history of intermittent palpitation lasting for few minutes. She was using as needed atenolol.  No prior documented atrial fibrillation. Denies history of CAD, stroke, hypertension or diabetes mellitus.  Troponin negative x2.  Covid negative.  Serum creatinine normal.  Potassium 4.6.  Sodium 134.  Urine drug screen clear. ECG now shows SR with suggestion for bi-atrial dilatation, otherwise normal ECG.  I would recommend to switch cardizem drip to Cardizem CD 120 mg po daily.  She continues to have chest pain and palpitations while being in sinus rhythm, she is also complaining of headache, I will reevaluate in the morning, and if she is still symptomatic we will consider to proceed with coronary CTA to evaluate for coronary artery disease.  Ena Dawley, MD 04/06/2020

## 2020-04-07 DIAGNOSIS — I4891 Unspecified atrial fibrillation: Secondary | ICD-10-CM | POA: Diagnosis not present

## 2020-04-07 DIAGNOSIS — R0789 Other chest pain: Secondary | ICD-10-CM | POA: Diagnosis not present

## 2020-04-07 MED ORDER — ADULT MULTIVITAMIN W/MINERALS CH
1.0000 | ORAL_TABLET | Freq: Every day | ORAL | Status: DC
  Administered 2020-04-07 – 2020-04-12 (×6): 1 via ORAL
  Filled 2020-04-07 (×8): qty 1

## 2020-04-07 MED ORDER — DILTIAZEM HCL ER COATED BEADS 120 MG PO CP24
120.0000 mg | ORAL_CAPSULE | Freq: Every day | ORAL | Status: DC
  Administered 2020-04-08 – 2020-04-12 (×5): 120 mg via ORAL
  Filled 2020-04-07 (×6): qty 1

## 2020-04-07 MED ORDER — DILTIAZEM HCL ER COATED BEADS 120 MG PO CP24: 120.0000 mg | ORAL_CAPSULE | Freq: Once | ORAL | Status: DC

## 2020-04-07 MED ORDER — DILTIAZEM HCL ER COATED BEADS 240 MG PO CP24: 240.0000 mg | ORAL_CAPSULE | Freq: Every day | ORAL | Status: DC

## 2020-04-07 MED ORDER — ENSURE ENLIVE PO LIQD
237.0000 mL | Freq: Two times a day (BID) | ORAL | Status: DC
  Administered 2020-04-10 – 2020-04-11 (×2): 237 mL via ORAL

## 2020-04-07 NOTE — Progress Notes (Signed)
Initial Nutrition Assessment  DOCUMENTATION CODES:   Underweight   INTERVENTION:    Recommend continue regular diet to support adequate intake of protein and calories, despite hx of hyponatremia.   Ensure Enlive po BID, each supplement provides 350 kcal and 20 grams of protein.  MVI with minerals daily.  NUTRITION DIAGNOSIS:   Increased nutrient needs related to chronic illness as evidenced by estimated needs.  GOAL:   Patient will meet greater than or equal to 90% of their needs  MONITOR:   PO intake,Supplement acceptance,Labs  REASON FOR ASSESSMENT:   Consult Assessment of nutrition requirement/status  ASSESSMENT:   75 yo female admitted with SOB. Found to have A fib with RVR. PMH includes muscular dystrophy, peripheral neuropathy, chronic hyponatremia, diverticulosis, osteopenia, Meniere's disease, HOH, vertigo, malnutrition.   Usual weights reviewed. Patient appears to be chronically underweight with weights ranging from 47.6 kg to 49.4 kg over the past 4 years. This is likely related to muscle loss with muscular dystrophy.   Patient is on a regular diet. Meal intakes not recorded. Patient would benefit from PO supplements to ensure adequate intake of protein and calories.   Labs reviewed. Sodium 134 Medications reviewed and include Os-cal.   NUTRITION - FOCUSED PHYSICAL EXAM:  Unable to complete, but suspect would reveal muscle depletion related to muscular dystrophy.  Diet Order:   Diet Order            Diet regular Room service appropriate? Yes; Fluid consistency: Thin  Diet effective now                 EDUCATION NEEDS:   No education needs have been identified at this time  Skin:  Skin Assessment: Reviewed RN Assessment  Last BM:  2/1  Height:   Ht Readings from Last 1 Encounters:  04/06/20 5\' 6"  (1.676 m)    Weight:   Wt Readings from Last 1 Encounters:  04/06/20 48.1 kg    Ideal Body Weight:  59.1 kg  BMI:  Body mass index  is 17.11 kg/m.  Estimated Nutritional Needs:   Kcal:  1450-1650  Protein:  70-80 gm  Fluid:  1.5 L    Lucas Mallow, RD, LDN, CNSC Please refer to Amion for contact information.

## 2020-04-07 NOTE — Progress Notes (Signed)
Spoke with Sande Rives PA about pt being borderline orthostatic and increased dose of diltiazem ordered. Stated to hold for now and recheck orthos in 2hrs and give if improved. Carroll Kinds RN

## 2020-04-07 NOTE — Progress Notes (Signed)
   Notified patient had some dizziness when ambulating with PT and was slightly orthostatic with BP dropping from 126/66 sitting and 106/62 standing. Initially recommended rechecking orthostatic in a couple of hours and if normal, OK to give afternoon dose of Cardizem. However, then discussed with Dr. Meda Coffee who recommended discontinuing afternoon dose of Cardizem. Will continue current dose of Cardizem 120mg  daily tomorrow rather than increasing dose. Called and updated RN.  Darreld Mclean, PA-C 04/07/2020 2:07 PM

## 2020-04-07 NOTE — Evaluation (Signed)
Physical Therapy Evaluation Patient Details Name: Alicia [name] MRN: [id number] DOB: 11/28/1945 Today's Date: 04/07/2020   History of Present Illness  Pt is a 75 y/o female admitted secondary to SOB and palpitations. Found to have a fib with RVR. PMH includes muscular dystrophy, meniere's disease, basal cell cancer, and lumbar sugery.  Clinical Impression  Pt admitted secondary to problem above with deficits below. Pt presenting with dizziness and weakness this session and only able to tolerate short distance ambulation. Checked BP in sitting and standing as pt sitting at sink upon entry and did not have strength to stand and brush her teeth. BP sitting: 126/66 and standing: 106/62. Max HR 98 during short distance ambulation. Anticipate pt will progress well once symptoms improve. Will continue to follow acutely.     Follow Up Recommendations Home health PT;Supervision for mobility/OOB    Equipment Recommendations  Other (comment) (4 wheel walker with seat (rollator))    Recommendations for Other Services       Precautions / Restrictions Precautions Precautions: Fall Restrictions Weight Bearing Restrictions: No      Mobility  Bed Mobility Overal bed mobility: Modified Independent                  Transfers Overall transfer level: Needs assistance Equipment used: 1 person hand held assist Transfers: Sit to/from Stand Sit to Stand: Min assist         General transfer comment: Min A for steadying assist.  Ambulation/Gait Ambulation/Gait assistance: Min guard Gait Distance (Feet): 15 Feet Assistive device: 1 person hand held assist Gait Pattern/deviations: Step-through pattern;Decreased stride length Gait velocity: Decreased   General Gait Details: Very slow, guarded gait. Min guard for safety and pt requiring HHA. Pt reports increased fatigue and weakness and some dizziness.  Stairs            Wheelchair Mobility    Modified Rankin (Stroke Patients  Only)       Balance Overall balance assessment: Needs assistance Sitting-balance support: No upper extremity supported;Feet supported Sitting balance-Leahy Scale: Good Sitting balance - Comments: Pt had to sit in order to brush teeth   Standing balance support: Single extremity supported;During functional activity Standing balance-Leahy Scale: Poor Standing balance comment: Reliant on at least 1 UE support                             Pertinent Vitals/Pain Pain Assessment: Faces Faces Pain Scale: Hurts little more Pain Location: L side of chest Pain Descriptors / Indicators: Aching;Guarding;Grimacing Pain Intervention(s): Limited activity within patient's tolerance;Monitored during session;Repositioned    Home Living Family/patient expects to be discharged to:: Private residence Living Arrangements: Spouse/significant other Available Help at Discharge: Family;Available 24 hours/day Type of Home: House Home Access: Stairs to enter Entrance Stairs-Rails: None Entrance Stairs-Number of Steps: 2 Home Layout: Two level Home Equipment: Cane - single point;Shower seat - built in;Hand held shower head      Prior Function Level of Independence: Independent with assistive device(s)         Comments: Uses cane for outdoor mobility     Hand Dominance        Extremity/Trunk Assessment   Upper Extremity Assessment Upper Extremity Assessment: Generalized weakness    Lower Extremity Assessment Lower Extremity Assessment: Generalized weakness (muscle wasting from MD)    Cervical / Trunk Assessment Cervical / Trunk Assessment: Kyphotic  Communication   Communication: No difficulties  Cognition Arousal/Alertness: Awake/alert Behavior During Therapy:  WFL for tasks assessed/performed Overall Cognitive Status: Within Functional Limits for tasks assessed                                        General Comments General comments (skin integrity,  edema, etc.): BP in sitting at 126/66; BP in standing at 106/62. Max HR during ambulation 98.    Exercises     Assessment/Plan    PT Assessment Patient needs continued PT services  PT Problem List Decreased strength;Decreased balance;Decreased activity tolerance;Decreased mobility;Decreased knowledge of use of DME;Decreased knowledge of precautions;Cardiopulmonary status limiting activity       PT Treatment Interventions DME instruction;Gait training;Stair training;Functional mobility training;Therapeutic activities;Therapeutic exercise;Balance training;Patient/family education    PT Goals (Current goals can be found in the Care Plan section)  Acute Rehab PT Goals Patient Stated Goal: to feel better PT Goal Formulation: With patient Time For Goal Achievement: 04/21/20 Potential to Achieve Goals: Good    Frequency Min 3X/week   Barriers to discharge        Co-evaluation               AM-PAC PT "6 Clicks" Mobility  Outcome Measure Help needed turning from your back to your side while in a flat bed without using bedrails?: None Help needed moving from lying on your back to sitting on the side of a flat bed without using bedrails?: A Little Help needed moving to and from a bed to a chair (including a wheelchair)?: A Little Help needed standing up from a chair using your arms (e.g., wheelchair or bedside chair)?: A Little Help needed to walk in hospital room?: A Little Help needed climbing 3-5 steps with a railing? : A Lot 6 Click Score: 18    End of Session   Activity Tolerance: Treatment limited secondary to medical complications (Comment) (dizziness) Patient left: in bed;with call bell/phone within reach Nurse Communication: Mobility status PT Visit Diagnosis: Unsteadiness on feet (R26.81);Muscle weakness (generalized) (M62.81)    Time: 1040-1103 PT Time Calculation (min) (ACUTE ONLY): 23 min   Charges:   PT Evaluation $PT Eval Moderate Complexity: 1 Mod PT  Treatments $Therapeutic Activity: 8-22 mins        Lou Miner, DPT  Acute Rehabilitation Services  Pager: [phone] Office: [phone]   Rudean Hitt 04/07/2020, 12:07 PM

## 2020-04-07 NOTE — Progress Notes (Signed)
PROGRESS NOTE    Alicia [name]  ZOX:[id number] DOB: July 03, 1945 DOA: 04/05/2020 PCP: Lauree Chandler, NP   Chief Complaint  Patient presents with  . Cough  . Weakness  Brief Narrative: 2 old female with history of muscular dystrophy, peripheral neuropathy, chronic hyponatremia 120 [phone] presented to the ED with 1 week of shortness of breath with palpitation, nonproductive cough intermittent left-sided chest pressure. She was seen in the ED found to have new onset A. fib with RVR. Labs showed hyponatremia 130, troponin negative x2 COVID-19 negative, EKG A. fib with RVR, chest x-ray no acute cardiopulmonary process, had COPD and bilateral upper lobe pleural parenchymal scarring changes. Patient was placed on Cardizem drip and admitted.  Subjective: Seen/examined. This morning patient was dizzy understanding, took very long time he went to finish brushing. Does not feel well today but denies specific shortness of air or chest pain. Walked with PT and became orthostatic BP drop from 126/66->106/62   Assessment & Plan:  Atrial fibrillation with RVR: Now back in sinus rhythm, appreciate cardiology input, there is plan to increase Cardizem to 240 mg but patient was orthostatic and symptomatic and dizzy so plan is to keep on 120 mg.  Continue on Eliquis.  High sensitive troponin negative x3, echo with normal LVEF and wall motion.  Electrolytes and TSH normal  Chest pain along with palpitation, negative serial troponin.  ACS ruled out.  Cardiology on board and looking into possible coronary CTA tomorrow.  Keep on Eliquis.  Muscular dystrophy continue home baclofen/robaxin.  Chronic hyponatremia: Likely from very low solute diet at home for hypertension.  Okay to liberalize to regular diet.  Sodium improved.  Very hard of hearing. exRN  Nutrition: Diet Order            Diet regular Room service appropriate? Yes; Fluid consistency: Thin  Diet effective now                  Nutrition Problem: Increased nutrient needs Etiology: chronic illness Signs/Symptoms: estimated needs Interventions: Ensure Enlive (each supplement provides 350kcal and 20 grams of protein),MVI Pt's Body mass index is 17.11 kg/m.  DVT prophylaxis: SCDs Start: 04/06/20 0520 Eliquis Code Status:   Code Status: Full Code  Family Communication: plan of care discussed with patient at bedside. Updated her son Aline Brochure 2/1, 2/2- requesting to update him instead of father as he may not remember (daughter's no is not correct).  Status is: Inpatient Remains inpatient appropriate because:Unsafe d/c plan and Inpatient level of care appropriate due to severity of illness  Dispo:The patient is from: Home            Anticipated d/c is to: [facility]            Anticipated d/c date is: 1 day            Patient currently is not medically stable to d/c.  Difficult to place patient No  Consultants: Cardiology  Procedures:see note  Culture/Microbiology No results found for: SDES, SPECREQUEST, CULT, REPTSTATUS  Other culture-see note  Medications: Scheduled Meds: . apixaban  5 mg Oral BID  . baclofen  10 mg Oral QHS  . calcium carbonate  1 tablet Oral BID WC  . [START ON 04/08/2020] diltiazem  120 mg Oral Daily  . feeding supplement  237 mL Oral BID BM  . gabapentin  200 mg Oral QHS  . multivitamin with minerals  1 tablet Oral Daily   Continuous Infusions:  Antimicrobials: Anti-infectives (From admission,  onward)   None     Objective: Vitals: Today's Vitals   04/06/20 2003 04/07/20 0421 04/07/20 0840 04/07/20 1124  BP: (!) 141/77 137/75    Pulse: 82 88    Resp: 18 13    Temp:  98.1 F (36.7 C)    TempSrc:  Oral    SpO2: 99%     Weight:      Height:      PainSc:   2  0-No pain   No intake or output data in the 24 hours ending 04/07/20 1415 Filed Weights   04/05/20 1705 04/06/20 1902  Weight: 47.6 kg 48.1 kg   Weight change: 0.454 kg  Intake/Output from previous day: No  intake/output data recorded. Intake/Output this shift: No intake/output data recorded. Filed Weights   04/05/20 1705 04/06/20 1902  Weight: 47.6 kg 48.1 kg    Examination: General exam: AAO x3 elderly, thin frail HEENT:Oral mucosa moist, Ear/Nose WNL grossly,dentition normal. Respiratory system: bilaterally diminished,no wheezing or crackles,no use of accessory muscle, non tender. Cardiovascular system: S1 & S2 +, regular, No JVD. Gastrointestinal system: Abdomen soft, NT,ND, BS+. Nervous System:Alert, awake, very hard of hearing, non focal LE/UE. Extremities: No edema, distal peripheral pulses palpable.  Skin: No rashes,no icterus. MSK: Normal muscle bulk,tone, power   Data Reviewed: I have personally reviewed following labs and imaging studies CBC: Recent Labs  Lab 04/01/20 1352 04/05/20 1726 04/06/20 0703  WBC 7.7 6.2 6.0  NEUTROABS 6,483  --   --   HGB 14.6 14.4 13.8  HCT 43.8 41.8 39.7  MCV 91.4 90.3 89.8  PLT 262 257 [id number]   Basic Metabolic Panel: Recent Labs  Lab 04/01/20 1352 04/05/20 1726 04/06/20 0703  NA 131* 130* 134*  K 4.0 4.2 4.6  CL 92* 95* 101  CO2 29 26 24   GLUCOSE 122 103* 98  BUN 17 16 8   CREATININE 0.59* 0.51 0.48  CALCIUM 9.8 9.9 9.0  MG 2.2  --  2.2   GFR: Estimated Creatinine Clearance: 46.1 mL/min (by C-G formula based on SCr of 0.48 mg/dL). Liver Function Tests: Recent Labs  Lab 04/01/20 1352  AST 21  ALT 15  BILITOT 0.5  PROT 7.2   No results for input(s): LIPASE, AMYLASE in the last 168 hours. No results for input(s): AMMONIA in the last 168 hours. Coagulation Profile: No results for input(s): INR, PROTIME in the last 168 hours. Cardiac Enzymes: No results for input(s): CKTOTAL, CKMB, CKMBINDEX, TROPONINI in the last 168 hours. BNP (last 3 results) No results for input(s): PROBNP in the last 8760 hours. HbA1C: No results for input(s): HGBA1C in the last 72 hours. CBG: No results for input(s): GLUCAP in the last 168  hours. Lipid Profile: No results for input(s): CHOL, HDL, LDLCALC, TRIG, CHOLHDL, LDLDIRECT in the last 72 hours. Thyroid Function Tests: No results for input(s): TSH, T4TOTAL, FREET4, T3FREE, THYROIDAB in the last 72 hours. Anemia Panel: No results for input(s): VITAMINB12, FOLATE, FERRITIN, TIBC, IRON, RETICCTPCT in the last 72 hours. Sepsis Labs: No results for input(s): PROCALCITON, LATICACIDVEN in the last 168 hours.  Recent Results (from the past 240 hour(s))  SARS-COV-2 RNA,(COVID-19) QUAL NAAT     Status: None   Collection Time: 04/01/20  2:17 PM  Result Value Ref Range Status   SARS CoV2 RNA Not Detected Not Detect Final    Comment: . A Not Detected (negative) test result for this test means that SARS-CoV-2 RNA was not present in the specimen above the limit  of detection. A negative result does not rule out the possibility of COVID-19 and should not be used as the sole basis for treatment or patient management decisions.  If COVID-19 is still suspected, based on exposure history together with other clinical findings, re-testing should be considered in consultation with public health authorities. Laboratory test results should always be considered in the context of clinical observations and epidemiological data in making a final diagnosis and patient management decisions. . Please review the "Fact Sheets" and FDA authorized labeling available for health care providers and patients using the following websites: [email or web address]/ [facility]/fact-sheet [email or web address]://www.[email or web address]/home/[email or web address]-19/ Patients/[facility]/fact-sheet.html . This test has been authorized  by the FDA under an Emergency Use Authorization (EUA) for use by authorized laboratories. . Due to the current public health emergency, [facility] is receiving a high volume of samples from a wide variety of swabs and media for COVID-19 testing. In order to serve  patients during this public health crisis, samples from appropriate clinical sources are being tested. Negative test results derived from specimens received in non-commercially manufactured viral collection and transport media, or in media and sample collection kits not yet authorized by FDA for COVID-19 testing should be cautiously evaluated and the patient potentially subjected to extra precautions such as additional clinical monitoring, including collection of an additional specimen. . Methodology:  Nucleic Acid Amplification Test (NAAT) includes RT-PCR or TMA . Additional information about COVID-19 can be found at the [employer] website: www.[email or web address].com/Covid19   SARS Coronavirus 2 by RT PCR (hospital order, performed in [facility] hospital lab) Nasopharyngeal Nasopharyngeal Swab     Status: None   Collection Time: 04/06/20  3:57 AM   Specimen: Nasopharyngeal Swab  Result Value Ref Range Status   SARS Coronavirus 2 NEGATIVE NEGATIVE Final    Comment: (NOTE) SARS-CoV-2 target nucleic acids are NOT DETECTED.  The SARS-CoV-2 RNA is generally detectable in upper and lower respiratory specimens during the acute phase of infection. The lowest concentration of SARS-CoV-2 viral copies this assay can detect is 250 copies / mL. A negative result does not preclude SARS-CoV-2 infection and should not be used as the sole basis for treatment or other patient management decisions.  A negative result may occur with improper specimen collection / handling, submission of specimen other than nasopharyngeal swab, presence of viral mutation(s) within the areas targeted by this assay, and inadequate number of viral copies (<250 copies / mL). A negative result must be combined with clinical observations, patient history, and epidemiological information.  Fact Sheet for Patients:   StrictlyIdeas.no  Fact Sheet for Healthcare  Providers: BankingDealers.co.za  This test is not yet approved or  cleared by the [place] FDA and has been authorized for detection and/or diagnosis of SARS-CoV-2 by FDA under an Emergency Use Authorization (EUA).  This EUA will remain in effect (meaning this test can be used) for the duration of the COVID-19 declaration under Section 564(b)(1) of the Act, 21 U.S.C. section 360bbb-3(b)(1), unless the authorization is terminated or revoked sooner.  Performed at [facility], [place] [address] [place], [place] [address]      Radiology Studies: DG Chest 2 View  Result Date: 04/05/2020 CLINICAL DATA:  Left-sided chest pain, cough, and fatigue for 1 week. EXAM: CHEST - 2 VIEW COMPARISON:  12/13/2018 FINDINGS: The heart size and mediastinal contours are within normal limits. Aortic atherosclerotic calcification noted. Bilateral upper lobe pleural-parenchymal scarring remains stable. Pulmonary hyperinflation is again noted. No evidence of acute infiltrate or edema. No evidence of  pleural effusion. Bilateral breast implants again noted. IMPRESSION: COPD and bilateral upper lobe pleural-parenchymal scarring. No active lung disease. Electronically Signed   By: Marlaine Hind M.D.   On: 04/05/2020 18:26   ECHOCARDIOGRAM COMPLETE  Result Date: 04/06/2020    ECHOCARDIOGRAM REPORT   Patient Name:   Alicia [name] Date of Exam: 04/06/2020 Medical Rec #:  [id number]         Height:       66.0 in Accession #:    [id number]        Weight:       105.0 lb Date of Birth:  12-13-1945        BSA:          1.521 m Patient Age:    46 years          BP:           113/65 mmHg Patient Gender: F                 HR:           82 bpm. Exam Location:  Inpatient Procedure: 2D Echo, Color Doppler and Cardiac Doppler Indications:    R07.9* Chest pain, unspecified  History:        Patient has prior history of Echocardiogram examinations, most                 recent 07/11/2012. Arrythmias:Atrial  Fibrillation; Risk                 Factors:Dyslipidemia.  Sonographer:    Raquel Sarna Senior RDCS Referring Phys: [id number] Calvert Williams  Sonographer Comments: Very poor echo windows due to rib shadowing and implants. Patient unable to tolerate any probe pressure. IMPRESSIONS  1. Left ventricular ejection fraction, by estimation, is 60 to 65%. The left ventricle has normal function. The left ventricle has no regional wall motion abnormalities. Left ventricular diastolic parameters were normal.  2. Right ventricular systolic function is normal. The right ventricular size is normal.  3. The mitral valve is normal in structure. No evidence of mitral valve regurgitation. No evidence of mitral stenosis.  4. The aortic valve is normal in structure. Aortic valve regurgitation is not visualized. No aortic stenosis is present.  5. The inferior vena cava is normal in size with greater than 50% respiratory variability, suggesting right atrial pressure of 3 mmHg. FINDINGS  Left Ventricle: Left ventricular ejection fraction, by estimation, is 60 to 65%. The left ventricle has normal function. The left ventricle has no regional wall motion abnormalities. The left ventricular internal cavity size was normal in size. There is  no left ventricular hypertrophy. Left ventricular diastolic parameters were normal. Indeterminate filling pressures. Right Ventricle: The right ventricular size is normal. No increase in right ventricular wall thickness. Right ventricular systolic function is normal. Left Atrium: Left atrial size was normal in size. Right Atrium: Right atrial size was normal in size. Pericardium: There is no evidence of pericardial effusion. Mitral Valve: The mitral valve is normal in structure. Mild mitral annular calcification. No evidence of mitral valve regurgitation. No evidence of mitral valve stenosis. Tricuspid Valve: The tricuspid valve is normal in structure. Tricuspid valve regurgitation is not demonstrated. No evidence of  tricuspid stenosis. Aortic Valve: The aortic valve is normal in structure. Aortic valve regurgitation is not visualized. No aortic stenosis is present. Pulmonic Valve: The pulmonic valve was normal in structure. Pulmonic valve regurgitation is not visualized. No evidence of pulmonic stenosis. Aorta: The aortic root  is normal in size and structure. Venous: The inferior vena cava is normal in size with greater than 50% respiratory variability, suggesting right atrial pressure of 3 mmHg. IAS/Shunts: No atrial level shunt detected by color flow Doppler.  LEFT VENTRICLE PLAX 2D LVIDd:         3.80 cm  Diastology LVIDs:         2.60 cm  LV e' medial:    6.64 cm/s LV PW:         0.80 cm  LV E/e' medial:  14.2 LV IVS:        0.80 cm  LV e' lateral:   8.16 cm/s LVOT diam:     2.00 cm  LV E/e' lateral: 11.6 LV SV:         66 LV SV Index:   43 LVOT Area:     3.14 cm  RIGHT VENTRICLE RV S prime:     13.50 cm/s TAPSE (M-mode): 2.6 cm LEFT ATRIUM           Index       RIGHT ATRIUM           Index LA diam:      2.60 cm 1.71 cm/m  RA Area:     14.90 cm LA Vol (A4C): 39.2 ml 25.78 ml/m RA Volume:   38.50 ml  25.32 ml/m  AORTIC VALVE LVOT Vmax:   88.80 cm/s LVOT Vmean:  62.300 cm/s LVOT VTI:    0.209 m  AORTA Ao Root diam: 2.30 cm MITRAL VALVE MV Area (PHT): 4.60 cm    SHUNTS MV Decel Time: 165 msec    Systemic VTI:  0.21 m MV E velocity: 94.30 cm/s  Systemic Diam: 2.00 cm MV A velocity: 80.60 cm/s MV E/A ratio:  1.17 Mihai Croitoru MD Electronically signed by Sanda Klein MD Signature Date/Time: 04/06/2020/3:47:55 PM    Final      LOS: 1 day   Antonieta Pert, MD [facility]  04/07/2020, 2:15 PM

## 2020-04-07 NOTE — Progress Notes (Addendum)
Progress Note  Patient Name: AARON BOEH Date of Encounter: 04/07/2020  [facility] Cardiologist: New (Previously seen by Dr. Mare Ferrari)  Subjective   No significant overnight events. She is maintaining normal sinus rhythm. She states she finally got some good sleep last night and is feeling better. Although she states she has not been up today yet. She denies any chest pain but does not a little substernal chest soreness which she is wondering could be from the heavy telemetry monitor. No shortness of breath. No palpitations.  Inpatient Medications    Scheduled Meds: . apixaban  5 mg Oral BID  . baclofen  10 mg Oral QHS  . calcium carbonate  1 tablet Oral BID WC  . diltiazem  120 mg Oral Daily  . gabapentin  200 mg Oral QHS   Continuous Infusions:  PRN Meds: acetaminophen **OR** acetaminophen, butalbital-acetaminophen-caffeine, meclizine, methocarbamol, SUMAtriptan   Vital Signs    Vitals:   04/06/20 1715 04/06/20 1902 04/06/20 2003 04/07/20 0421  BP: (!) 141/68 (!) 166/78 (!) 141/77 137/75  Pulse: 79 89 82 88  Resp: (!) 24 20 18 13   Temp:  97.9 F (36.6 C)  98.1 F (36.7 C)  TempSrc:  Oral  Oral  SpO2: 98% 99% 99%   Weight:  48.1 kg    Height:  5\' 6"  (1.676 m)     No intake or output data in the 24 hours ending 04/07/20 0832 Last 3 Weights 04/06/2020 04/05/2020 04/01/2020  Weight (lbs) 106 lb 105 lb 106 lb 12.8 oz  Weight (kg) 48.081 kg 47.628 kg 48.444 kg      Telemetry    Normal sinus rhythm with rates in the 70's. - Personally Reviewed  ECG    No new ECG tracings today. - Personally Reviewed  Physical Exam   GEN: No acute distress.   Neck: No JVD Cardiac: RRR. No murmurs, rubs, or gallops. Radial and distal pedal pulses 2+ and equal bilaterally. Respiratory: Clear to auscultation bilaterally. No wheezes, rhonchi, or rales. GI: Soft, non-distended, and non-tender. MS: No lower extremity edema. No deformity. Skin: Warm and dry. Neuro:  No focal  deficits. Psych: Normal affect.  Labs    High Sensitivity Troponin:   Recent Labs  Lab 04/05/20 1726 04/05/20 2126 04/06/20 0703  TROPONINIHS 5 5 8       Chemistry Recent Labs  Lab 04/01/20 1352 04/05/20 1726 04/06/20 0703  NA 131* 130* 134*  K 4.0 4.2 4.6  CL 92* 95* 101  CO2 29 26 24   GLUCOSE 122 103* 98  BUN 17 16 8   CREATININE 0.59* 0.51 0.48  CALCIUM 9.8 9.9 9.0  PROT 7.2  --   --   AST 21  --   --   ALT 15  --   --   BILITOT 0.5  --   --   GFRNONAA 90 >60 >60  GFRAA 104  --   --   ANIONGAP  --  9 9     Hematology Recent Labs  Lab 04/01/20 1352 04/05/20 1726 04/06/20 0703  WBC 7.7 6.2 6.0  RBC 4.79 4.63 4.42  HGB 14.6 14.4 13.8  HCT 43.8 41.8 39.7  MCV 91.4 90.3 89.8  MCH 30.5 31.1 31.2  MCHC 33.3 34.4 34.8  RDW 11.8 13.2 13.4  PLT 262 257 228    BNPNo results for input(s): BNP, PROBNP in the last 168 hours.   DDimer No results for input(s): DDIMER in the last 168 hours.   Radiology  DG Chest 2 View  Result Date: 04/05/2020 CLINICAL DATA:  Left-sided chest pain, cough, and fatigue for 1 week. EXAM: CHEST - 2 VIEW COMPARISON:  12/13/2018 FINDINGS: The heart size and mediastinal contours are within normal limits. Aortic atherosclerotic calcification noted. Bilateral upper lobe pleural-parenchymal scarring remains stable. Pulmonary hyperinflation is again noted. No evidence of acute infiltrate or edema. No evidence of pleural effusion. Bilateral breast implants again noted. IMPRESSION: COPD and bilateral upper lobe pleural-parenchymal scarring. No active lung disease. Electronically Signed   By: Marlaine Hind M.D.   On: 04/05/2020 18:26   ECHOCARDIOGRAM COMPLETE  Result Date: 04/06/2020    ECHOCARDIOGRAM REPORT   Patient Name:   MARYPAT MIEDEMA Date of Exam: 04/06/2020 Medical Rec #:  [id number]         Height:       66.0 in Accession #:    [id number]        Weight:       105.0 lb Date of Birth:  10/11/1945        BSA:          1.521 m Patient Age:     75 years          BP:           113/65 mmHg Patient Gender: F                 HR:           82 bpm. Exam Location:  Inpatient Procedure: 2D Echo, Color Doppler and Cardiac Doppler Indications:    R07.9* Chest pain, unspecified  History:        Patient has prior history of Echocardiogram examinations, most                 recent 07/11/2012. Arrythmias:Atrial Fibrillation; Risk                 Factors:Dyslipidemia.  Sonographer:    Raquel Sarna Senior RDCS Referring Phys: [id number] [place]  Sonographer Comments: Very poor echo windows due to rib shadowing and implants. Patient unable to tolerate any probe pressure. IMPRESSIONS  1. Left ventricular ejection fraction, by estimation, is 60 to 65%. The left ventricle has normal function. The left ventricle has no regional wall motion abnormalities. Left ventricular diastolic parameters were normal.  2. Right ventricular systolic function is normal. The right ventricular size is normal.  3. The mitral valve is normal in structure. No evidence of mitral valve regurgitation. No evidence of mitral stenosis.  4. The aortic valve is normal in structure. Aortic valve regurgitation is not visualized. No aortic stenosis is present.  5. The inferior vena cava is normal in size with greater than 50% respiratory variability, suggesting right atrial pressure of 3 mmHg. FINDINGS  Left Ventricle: Left ventricular ejection fraction, by estimation, is 60 to 65%. The left ventricle has normal function. The left ventricle has no regional wall motion abnormalities. The left ventricular internal cavity size was normal in size. There is  no left ventricular hypertrophy. Left ventricular diastolic parameters were normal. Indeterminate filling pressures. Right Ventricle: The right ventricular size is normal. No increase in right ventricular wall thickness. Right ventricular systolic function is normal. Left Atrium: Left atrial size was normal in size. Right Atrium: Right atrial size was normal in size.  Pericardium: There is no evidence of pericardial effusion. Mitral Valve: The mitral valve is normal in structure. Mild mitral annular calcification. No evidence of mitral valve regurgitation. No evidence of mitral valve  stenosis. Tricuspid Valve: The tricuspid valve is normal in structure. Tricuspid valve regurgitation is not demonstrated. No evidence of tricuspid stenosis. Aortic Valve: The aortic valve is normal in structure. Aortic valve regurgitation is not visualized. No aortic stenosis is present. Pulmonic Valve: The pulmonic valve was normal in structure. Pulmonic valve regurgitation is not visualized. No evidence of pulmonic stenosis. Aorta: The aortic root is normal in size and structure. Venous: The inferior vena cava is normal in size with greater than 50% respiratory variability, suggesting right atrial pressure of 3 mmHg. IAS/Shunts: No atrial level shunt detected by color flow Doppler.  LEFT VENTRICLE PLAX 2D LVIDd:         3.80 cm  Diastology LVIDs:         2.60 cm  LV e' medial:    6.64 cm/s LV PW:         0.80 cm  LV E/e' medial:  14.2 LV IVS:        0.80 cm  LV e' lateral:   8.16 cm/s LVOT diam:     2.00 cm  LV E/e' lateral: 11.6 LV SV:         66 LV SV Index:   43 LVOT Area:     3.14 cm  RIGHT VENTRICLE RV S prime:     13.50 cm/s TAPSE (M-mode): 2.6 cm LEFT ATRIUM           Index       RIGHT ATRIUM           Index LA diam:      2.60 cm 1.71 cm/m  RA Area:     14.90 cm LA Vol (A4C): 39.2 ml 25.78 ml/m RA Volume:   38.50 ml  25.32 ml/m  AORTIC VALVE LVOT Vmax:   88.80 cm/s LVOT Vmean:  62.300 cm/s LVOT VTI:    0.209 m  AORTA Ao Root diam: 2.30 cm MITRAL VALVE MV Area (PHT): 4.60 cm    SHUNTS MV Decel Time: 165 msec    Systemic VTI:  0.21 m MV E velocity: 94.30 cm/s  Systemic Diam: 2.00 cm MV A velocity: 80.60 cm/s MV E/A ratio:  1.17 Mihai Croitoru MD Electronically signed by Sanda Klein MD Signature Date/Time: 04/06/2020/3:47:55 PM    Final     Cardiac Studies   Echocardiogram  04/06/2020: Impressions: 1. Left ventricular ejection fraction, by estimation, is 60 to 65%. The  left ventricle has normal function. The left ventricle has no regional  wall motion abnormalities. Left ventricular diastolic parameters were  normal.  2. Right ventricular systolic function is normal. The right ventricular  size is normal.  3. The mitral valve is normal in structure. No evidence of mitral valve  regurgitation. No evidence of mitral stenosis.  4. The aortic valve is normal in structure. Aortic valve regurgitation is  not visualized. No aortic stenosis is present.  5. The inferior vena cava is normal in size with greater than 50%  respiratory variability, suggesting right atrial pressure of 3 mmHg.   Patient Profile     75 y.o. 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 Lupita Leash.  Assessment & Plan    New Onset Atrial Fibrillation - Converted to sinus rhythm yesterday after being started on Cardizem. Maintaining sinus rhythm with rates in the 70's. -  Echo showed LVEF of 60-65% with normal wall motion.  - Potassium 4.6. - Magnesium 2.2. - TSH normal. - Continue PO Cardizem 120mg  daily. - CHA2DS2-VASc = 3 (age x2, female). Continue Eliquis 5mg  twice daily.  Chest Pain - Patient had severe chest pain with her palpitations. - EKG showed ST depression in inferior leads and V4-V6 and ST elevation in aVR and V1 while in atrial fibrillation with rates in the 140's. When in sinus rhythm, EKG shwoed T wave inversion in aVL and V2 but other ST/T changes resolved. - High-sensitivity troponin negative x3 (5 >> 5 >> 8).  - Echo with normal LVEF and wall motion. - Patient reports possible very mild chest soreness this morning which she thinks may be from the heavy  telemetry monitor that was laying on her chest all night. No  chest pain like she was having before. - Dr. Francesca Oman note yesterday mentioned possible coronary CTA if she was still having symptoms. I will discuss with her whether she feels if this is needed or not.  Otherwise, per primary team: - Chronic hypernatremia: stable - Muscular dystrophy - Meniere's disease  For questions or updates, please contact [place] Please consult [email or web address] for contact info under      Signed, Darreld Mclean, PA-C  04/07/2020, 8:32 AM    The patient was seen, examined and discussed with Darreld Mclean, PA-C  and I agree with the above.   The patient remains in sinus rhythm, however she still complains of significant shortness of breath with minimal exertion such as walking to the restroom, she states this is a significant difference for her and has just started last week. Her troponins were negative, sinus rhythm no acute changes no abnormalities suggestive of ischemia.  Her echocardiogram shows normal left ventricle systolic diastolic function no significant valvular abnormality.  Her physical exam does not suggest fluid overload and her chest x-ray shows COPD but otherwise CHF.  I will have her recover today and if she continues to have the symptoms we will consider performing coronary CTA tomorrow, I will increase her Cardizem CD to 240 mg p.o. daily.  Ena Dawley, MD 04/07/2020

## 2020-04-07 NOTE — TOC Initial Note (Signed)
Transition of Care [facility]) - Initial/Assessment Note    Patient Details  Name: Alicia [name] MRN: [id number] Date of Birth: 1945-07-23  [facility] CM/SW Contact:    Ninfa Meeker, RN Phone Number: 04/07/2020, 4:04 PM  Clinical Narrative:     Case manager spoke with patient concerning discharge needs. Discussed choices for [place]. Patient very anxious about anyone coming to her home, doesn't want to contract COVID. Patient states she has received one vaccination which caused her to have a hearing loss and can not be further vaccinated, She also has MS and is compromised. Case manager and patient discussed her concerns at length. Alicia [name] is agreeable to HHPT as long as person coming will be masked and comply with her requests to sanitize hands and remove shoes. Referral was called to Adela Lank, [facility] Liaison, and patient's concerns were relayed to him as well. Patient asks that here husband be called to arrange for therapy visits: Alicia [name] [phone].    Expected Discharge Plan: [place] Barriers to Discharge: No Barriers Identified   Patient Goals and CMS Choice Patient states their goals for this hospitalization and ongoing recovery are:: to get better CMS Medicare.gov Compare Post Acute Care list provided to:: Patient Choice offered to / list presented to : Patient  Expected Discharge Plan and Services Expected Discharge Plan: [place] In-house Referral: NA Discharge Planning Services: CM Consult Post Acute Care Choice: [place] arrangements for the past 2 months: Single Family Home                 DME Arranged: [place] rolling with seat DME Agency: [employer] Date DME Agency Contacted: 04/07/20 Time DME Agency Contacted: 1603 Representative spoke with at DME Agency: Freda Munro HH Arranged: PT [place]: [place] Date [place]:  04/07/20 Time [place]: 53 Representative spoke with at [place]: Adela Lank  Prior Living Arrangements/Services Living arrangements for the past 2 months: [place] with:: Spouse Patient language and need for interpreter reviewed:: Yes Do you feel safe going back to the place where you live?: Yes      Need for Family Participation in Patient Care: Yes (Comment) Care giver support system in place?: Yes (comment)   Criminal Activity/Legal Involvement Pertinent to Current Situation/Hospitalization: No - Comment as needed  Activities of Daily Living Home Assistive Devices/Equipment: Eyeglasses,Hearing aid ADL Screening (condition at time of admission) Patient's cognitive ability adequate to safely complete daily activities?: Yes Is the patient deaf or have difficulty hearing?: Yes Does the patient have difficulty seeing, even when wearing glasses/contacts?: No Does the patient have difficulty concentrating, remembering, or making decisions?: No Patient able to express need for assistance with ADLs?: Yes Does the patient have difficulty dressing or bathing?: No Independently performs ADLs?: Yes (appropriate for developmental age) Does the patient have difficulty walking or climbing stairs?: No Weakness of Legs: None Weakness of Arms/Hands: None  Permission Sought/Granted Permission sought to share information with : Case Manager       Permission granted to share info w AGENCY: [facility]/ [employer]        Emotional Assessment   Attitude/Demeanor/Rapport: Gracious   Orientation: : Oriented to Self,Oriented to Place,Oriented to  Time,Oriented to Situation Alcohol / Substance Use: Not Applicable Psych Involvement: No (comment)  Admission diagnosis:  Cough [R05.9] Atrial fibrillation with rapid ventricular response ([place]) [I48.91] Atrial fibrillation with RVR ([place]) [I48.91] Patient  Active Problem List   Diagnosis Date Noted  . Atrial fibrillation with  RVR ([place]) 04/06/2020  . Atypical chest pain 04/06/2020  . Acute bronchitis 12/21/2015  . Chest pain 10/19/2014  . Carotid stenosis 10/19/2014  . Diverticulosis of colon without hemorrhage 08/20/2012  . Heart palpitations 07/04/2012  . Chronic hyponatremia 07/04/2012  . Lumbar radiculopathy 06/03/2012  . 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 Muscular dystrophy ([place]) 11/23/2011  . PARESTHESIA 11/11/2009  . PERIPHERAL NEUROPATHY 01/29/2008  . Disorder of bone and cartilage 01/27/2008  . SKIN CANCER, HX OF 01/27/2008  . GOITER, NONTOXIC MULTINODULAR 10/23/2006  . HYPERLIPIDEMIA NEC/NOS 10/23/2006  . FIBROMYALGIA 10/23/2006   PCP:  Lauree Chandler, NP Pharmacy:   [place], [place] - [address] [place] [address] Phone: [phone] Fax: [phone]  [facility] [phone] - [place], [place] - [address] [place] [address] Phone: [phone] Fax: [phone]  [facility] [place], [place] [address] Phone: [phone] Fax: [phone]     Social Determinants of Health (SDOH) Interventions    Readmission Risk Interventions No flowsheet data found.

## 2020-04-08 ENCOUNTER — Inpatient Hospital Stay (HOSPITAL_COMMUNITY): Payer: Medicare Other

## 2020-04-08 DIAGNOSIS — I4891 Unspecified atrial fibrillation: Secondary | ICD-10-CM | POA: Diagnosis not present

## 2020-04-08 DIAGNOSIS — G7100 Muscular dystrophy, unspecified: Secondary | ICD-10-CM

## 2020-04-08 DIAGNOSIS — R0602 Shortness of breath: Secondary | ICD-10-CM

## 2020-04-08 LAB — BASIC METABOLIC PANEL
Anion gap: 9 (ref 5–15)
BUN: 14 mg/dL (ref 8–23)
CO2: 25 mmol/L (ref 22–32)
Calcium: 8.7 mg/dL — ABNORMAL LOW (ref 8.9–10.3)
Chloride: 97 mmol/L — ABNORMAL LOW (ref 98–111)
Creatinine, Ser: 0.56 mg/dL (ref 0.44–1.00)
GFR, Estimated: >60 mL/min (ref >60)
Glucose, Bld: 89 mg/dL (ref 70–99)
Potassium: 3.6 mmol/L (ref 3.5–5.1)
Sodium: 131 mmol/L — ABNORMAL LOW (ref 135–145)

## 2020-04-08 LAB — TROPONIN I (HIGH SENSITIVITY): Troponin I (High Sensitivity): 6 ng/L (ref <18)

## 2020-04-08 MED ORDER — NITROGLYCERIN 0.4 MG SL SUBL
SUBLINGUAL_TABLET | SUBLINGUAL | Status: AC
  Administered 2020-04-08: 0.4 mg
  Filled 2020-04-08: qty 1

## 2020-04-08 MED ORDER — HYDROCORTISONE 1 % EX CREA
1.0000 "application " | TOPICAL_CREAM | Freq: Two times a day (BID) | CUTANEOUS | Status: DC
  Administered 2020-04-08 – 2020-04-11 (×7): 1 via TOPICAL
  Filled 2020-04-08 (×2): qty 28

## 2020-04-08 MED ORDER — MORPHINE SULFATE (PF) 2 MG/ML IV SOLN
2.0000 mg | Freq: Once | INTRAVENOUS | Status: AC
Start: 2020-04-08 — End: 2020-04-08
  Administered 2020-04-08: 2 mg via INTRAVENOUS
  Filled 2020-04-08: qty 1

## 2020-04-08 MED ORDER — IVABRADINE HCL 5 MG PO TABS
10.0000 mg | ORAL_TABLET | Freq: Once | ORAL | Status: DC | PRN
  Filled 2020-04-08: qty 2

## 2020-04-08 MED ORDER — IOHEXOL 350 MG/ML SOLN
100.0000 mL | Freq: Once | INTRAVENOUS | Status: AC | PRN
  Administered 2020-04-08: 60 mL via INTRAVENOUS

## 2020-04-08 MED ORDER — GABAPENTIN 100 MG PO CAPS
200.0000 mg | ORAL_CAPSULE | Freq: Every day | ORAL | Status: DC
  Administered 2020-04-08 – 2020-04-11 (×4): 200 mg via ORAL
  Filled 2020-04-08 (×4): qty 2

## 2020-04-08 NOTE — Progress Notes (Addendum)
Progress Note  Patient Name: Alicia [name] Date of Encounter: 04/08/2020  [facility] Cardiologist: New (Previously seen by Dr. Mare Ferrari)  Subjective   Patient had another episode of palpitations and shortness of breath early this morning while getting up to void. Rates were in the 120's to 140's. Repeat EKG looks like atrial fibrillation/flutter with rates in the 120's (possible sinus tachycardia with frequent PACs).  Patient symptomatic with shortness of breath and chest pain while in atrial fibrillation. She converted back to sinus rhythm with rates in the 60's shortly after 7am. She denied any chest pain but then when I asked her if she had any shortness of breath she described diffuse chest pressure/heaviness.  Inpatient Medications    Scheduled Meds: . apixaban  5 mg Oral BID  . baclofen  10 mg Oral QHS  . calcium carbonate  1 tablet Oral BID WC  . diltiazem  120 mg Oral Daily  . feeding supplement  237 mL Oral BID BM  . gabapentin  200 mg Oral QHS  . multivitamin with minerals  1 tablet Oral Daily   Continuous Infusions:  PRN Meds: acetaminophen **OR** acetaminophen, butalbital-acetaminophen-caffeine, meclizine, methocarbamol, SUMAtriptan   Vital Signs    Vitals:   04/07/20 0421 04/07/20 1400 04/07/20 2113 04/08/20 0424  BP: 137/75  (!) 141/66 123/88  Pulse: 88 95  (!) 109  Resp: 13 15 15 15   Temp: 98.1 F (36.7 C) 98.6 F (37 C) 97.6 F (36.4 C) 97.6 F (36.4 C)  TempSrc: Oral Oral Axillary Axillary  SpO2:    100%  Weight:      Height:       No intake or output data in the 24 hours ending 04/08/20 0642 Last 3 Weights 04/06/2020 04/05/2020 04/01/2020  Weight (lbs) 106 lb 105 lb 106 lb 12.8 oz  Weight (kg) 48.081 kg 47.628 kg 48.444 kg      Telemetry    Went into atrial fibrillation/flutter with rates in the 100's to 120's (as high as the 140's) for almost 5 hours. Converted back to sinus rhythm with rates in the 60's a little after 7am. - Personally  Reviewed  ECG    Sinus tachycardia, rate 127 bpm, with frequent PACs. Diffuse ST depressions in inferior leads and V5-V6. Slight ST elevations in V1-V2 not consistent with STEMI. T wave inversions in aVL and V2. Possible Q waves in V1-V2. - Personally Reviewed  Physical Exam   GEN: Thin Caucasian female. No acute distress.   Neck: Supple. No JVD Cardiac: RRR. No murmurs, rubs, or gallops. Radial pulses 2+ and equal bilaterally. Respiratory: Clear to auscultation bilaterally. No wheezes, rhonchi, or rales. GI: Soft, non-distended, and non-tender. MS: No lower extremity edema. No deformity. Skin: Warm and dry. Neuro:  No focal deficits. Psych: Normal affect. Responds appropriately.  Labs    High Sensitivity Troponin:   Recent Labs  Lab 04/05/20 1726 04/05/20 2126 04/06/20 0703  TROPONINIHS 5 5 8       Chemistry Recent Labs  Lab 04/01/20 1352 04/05/20 1726 04/06/20 0703  NA 131* 130* 134*  K 4.0 4.2 4.6  CL 92* 95* 101  CO2 29 26 24   GLUCOSE 122 103* 98  BUN 17 16 8   CREATININE 0.59* 0.51 0.48  CALCIUM 9.8 9.9 9.0  PROT 7.2  --   --   AST 21  --   --   ALT 15  --   --   BILITOT 0.5  --   --  GFRNONAA 90 >60 >60  GFRAA 104  --   --   ANIONGAP  --  9 9     Hematology Recent Labs  Lab 04/01/20 1352 04/05/20 1726 04/06/20 0703  WBC 7.7 6.2 6.0  RBC 4.79 4.63 4.42  HGB 14.6 14.4 13.8  HCT 43.8 41.8 39.7  MCV 91.4 90.3 89.8  MCH 30.5 31.1 31.2  MCHC 33.3 34.4 34.8  RDW 11.8 13.2 13.4  PLT 262 257 228    BNPNo results for input(s): BNP, PROBNP in the last 168 hours.   DDimer No results for input(s): DDIMER in the last 168 hours.   Radiology    ECHOCARDIOGRAM COMPLETE  Result Date: 04/06/2020    ECHOCARDIOGRAM REPORT   Patient Name:   Alicia [name] Date of Exam: 04/06/2020 Medical Rec #:  [id number]         Height:       66.0 in Accession #:    [id number]        Weight:       105.0 lb Date of Birth:  Jul 15, 1945        BSA:          1.521 m Patient  Age:    75 years          BP:           113/65 mmHg Patient Gender: F                 HR:           82 bpm. Exam Location:  Inpatient Procedure: 2D Echo, Color Doppler and Cardiac Doppler Indications:    R07.9* Chest pain, unspecified  History:        Patient has prior history of Echocardiogram examinations, most                 recent 07/11/2012. Arrythmias:Atrial Fibrillation; Risk                 Factors:Dyslipidemia.  Sonographer:    Raquel Sarna Senior RDCS Referring Phys: [id number] [place]  Sonographer Comments: Very poor echo windows due to rib shadowing and implants. Patient unable to tolerate any probe pressure. IMPRESSIONS  1. Left ventricular ejection fraction, by estimation, is 60 to 65%. The left ventricle has normal function. The left ventricle has no regional wall motion abnormalities. Left ventricular diastolic parameters were normal.  2. Right ventricular systolic function is normal. The right ventricular size is normal.  3. The mitral valve is normal in structure. No evidence of mitral valve regurgitation. No evidence of mitral stenosis.  4. The aortic valve is normal in structure. Aortic valve regurgitation is not visualized. No aortic stenosis is present.  5. The inferior vena cava is normal in size with greater than 50% respiratory variability, suggesting right atrial pressure of 3 mmHg. FINDINGS  Left Ventricle: Left ventricular ejection fraction, by estimation, is 60 to 65%. The left ventricle has normal function. The left ventricle has no regional wall motion abnormalities. The left ventricular internal cavity size was normal in size. There is  no left ventricular hypertrophy. Left ventricular diastolic parameters were normal. Indeterminate filling pressures. Right Ventricle: The right ventricular size is normal. No increase in right ventricular wall thickness. Right ventricular systolic function is normal. Left Atrium: Left atrial size was normal in size. Right Atrium: Right atrial size was normal in  size. Pericardium: There is no evidence of pericardial effusion. Mitral Valve: The mitral valve is normal in structure. Mild mitral  annular calcification. No evidence of mitral valve regurgitation. No evidence of mitral valve stenosis. Tricuspid Valve: The tricuspid valve is normal in structure. Tricuspid valve regurgitation is not demonstrated. No evidence of tricuspid stenosis. Aortic Valve: The aortic valve is normal in structure. Aortic valve regurgitation is not visualized. No aortic stenosis is present. Pulmonic Valve: The pulmonic valve was normal in structure. Pulmonic valve regurgitation is not visualized. No evidence of pulmonic stenosis. Aorta: The aortic root is normal in size and structure. Venous: The inferior vena cava is normal in size with greater than 50% respiratory variability, suggesting right atrial pressure of 3 mmHg. IAS/Shunts: No atrial level shunt detected by color flow Doppler.  LEFT VENTRICLE PLAX 2D LVIDd:         3.80 cm  Diastology LVIDs:         2.60 cm  LV e' medial:    6.64 cm/s LV PW:         0.80 cm  LV E/e' medial:  14.2 LV IVS:        0.80 cm  LV e' lateral:   8.16 cm/s LVOT diam:     2.00 cm  LV E/e' lateral: 11.6 LV SV:         66 LV SV Index:   43 LVOT Area:     3.14 cm  RIGHT VENTRICLE RV S prime:     13.50 cm/s TAPSE (M-mode): 2.6 cm LEFT ATRIUM           Index       RIGHT ATRIUM           Index LA diam:      2.60 cm 1.71 cm/m  RA Area:     14.90 cm LA Vol (A4C): 39.2 ml 25.78 ml/m RA Volume:   38.50 ml  25.32 ml/m  AORTIC VALVE LVOT Vmax:   88.80 cm/s LVOT Vmean:  62.300 cm/s LVOT VTI:    0.209 m  AORTA Ao Root diam: 2.30 cm MITRAL VALVE MV Area (PHT): 4.60 cm    SHUNTS MV Decel Time: 165 msec    Systemic VTI:  0.21 m MV E velocity: 94.30 cm/s  Systemic Diam: 2.00 cm MV A velocity: 80.60 cm/s MV E/A ratio:  1.17 Mihai Croitoru MD Electronically signed by Sanda Klein MD Signature Date/Time: 04/06/2020/3:47:55 PM    Final     Cardiac Studies   Echocardiogram  04/06/2020: Impressions: 1. Left ventricular ejection fraction, by estimation, is 60 to 65%. The  left ventricle has normal function. The left ventricle has no regional  wall motion abnormalities. Left ventricular diastolic parameters were  normal.  2. Right ventricular systolic function is normal. The right ventricular  size is normal.  3. The mitral valve is normal in structure. No evidence of mitral valve  regurgitation. No evidence of mitral stenosis.  4. The aortic valve is normal in structure. Aortic valve regurgitation is  not visualized. No aortic stenosis is present.  5. The inferior vena cava is normal in size with greater than 50%  respiratory variability, suggesting right atrial pressure of 3 mmHg.   Patient Profile   Ms. Garity is a 75 y.o. 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 Lupita Leash.  Assessment & Plan    New Onset Atrial Fibrillation - Converted to sinus rhythm  yesterday after being started on Cardizem. Went back into atrial fibrillation/flutter early this morning for a few hours. However, has converted back to sinus rhythm.  - Echo showed LVEF of 60-65% with normal wall motion.  - Potassium 4.6 on 2/1. Will repeat BMET today. - Magnesium 2.2 on 2/1. - TSH normal. - Continue PO Cardizem 120mg  daily. - May need to consider adding Amiodarone to help keep her in rhythm. Will discuss with MD. - CHA2DS2-VASc = 3 (age x2, female). Continue Eliquis 5mg  twice daily.  Chest Pain - Patient had severe chest pain with her palpitations. - EKG showed ST depression in inferior leads and V4-V6 and ST elevation in aVR and V1 while in atrial fibrillation with rates in the 140's. When in sinus rhythm, EKG shwoed T wave inversion in aVL and V2 but other ST/T  changes resolved. - High-sensitivity troponin negative x3 (5 >> 5 >> 8).  - Echo with normal LVEF and wall motion. - Patient reports possible very mild chest soreness this morning which she thinks may be from the heavy telemetry monitor that was laying on her chest all night. No  chest pain like she was having before. - Will discuss possible coronary CTA with MD given persistent symptoms. Rates currently in the 60-'s to 70's.  Orthostatic Hypotension - Patient was slightly orthostatic yesterday with systolic BP dropping from 126 when sitting to 106 when standing. She was dizzy with this. - Will repeat orthostatic vital signs today.  Otherwise, per primary team: - Chronic hypernatremia: stable - Muscular dystrophy - Meniere's disease  For questions or updates, please contact [place] Please consult [email or web address] for contact info under    Signed, Darreld Mclean, PA-C  04/08/2020, 6:42 AM    The patient was seen, examined and discussed with Darreld Mclean, PA-C  and I agree with the above.   The patient went into another episode of atrial fibrillation with RVR at night started at 2:30 AM and spontaneously cardioverted back to sinus rhythm at 7 AM.  Patient is on lowest dose of Cardizem CD 120 mg daily, she felt dizzy this morning and was orthostatic with blood pressure dropping to 90 mmHg when she got up.  She remains really short of breath that is very unusual for her and started last week.  We will order chest CTA to rule out pulmonary embolism, since the contrast will be on the right side will be able to see if there are any obvious calcification in her coronary tree to also assess for CAD.  If she remains orthostatic we might consider switching Cardizem to metoprolol 50 mg p.o. twice daily.  Ena Dawley, MD 04/08/2020

## 2020-04-08 NOTE — Progress Notes (Signed)
Progress Note    Alicia [name]  [id number] DOB: 1945-07-20  DOA: 04/05/2020 PCP: Lauree Chandler, NP    Brief Narrative:    Medical records reviewed and are as summarized below:  Alicia [name] is an 75 y.o. female with history of muscular dystrophy, peripheral neuropathy, chronic hyponatremia who presented to the ED with 1 week of shortness of breath with palpitations, nonproductive cough, and intermittent left-sided chest pressure.  She was seen in the ED found to have new onset A. fib with RVR. Hospital stay has been complicated by continued chest discomfort as well as orthostatic hypotension  Assessment/Plan:   Principal Problem:   Atrial fibrillation with RVR (HCC) Active Problems:   Muscular dystrophy (HCC)   Chronic hyponatremia   Atypical chest pain   Atrial fibrillation with RVR complicated by orthostatic hypotension  -this AM converted back to sinus -appreciate cardiology input, there was plan to increase Cardizem to 240 mg but patient was orthostatic and symptomatic and dizzy so plan is to keep on 120 mg-- may need to change to metoprolol - Continue on Eliquis.  High sensitive troponin negative x3, echo with normal LVEF and wall motion.  -Electrolytes and TSH normal  Chest pain along with palpitation, negative serial troponin.  ACS ruled out.   -CT scan to r/o PE negative -defer to cards  Muscular dystrophy continue home baclofen/robaxin.  Chronic hyponatremia: Likely from very low solute diet at home for hypertension.  Okay to liberalize to regular diet.  Sodium improved.   PT eval as she had mobility issues prior to hospitalization   Family Communication/Anticipated D/C date and plan/Code Status   DVT prophylaxis: eliquis Code Status: Full Code.  Disposition Plan: Status is: Inpatient  Remains inpatient appropriate because:Inpatient level of care appropriate due to severity of illness   Dispo: The patient is from: Home               Anticipated d/c is to: Home              Anticipated d/c date is: 2 days              Patient currently is not medically stable to d/c.   Difficult to place patient No         Medical Consultants:    cards  Subjective:   Multiple complaints but says she feels like she is trapped in her house due to mobility issues Likes making quilts  Objective:    Vitals:   04/08/20 0642 04/08/20 0900 04/08/20 1009 04/08/20 1256  BP: 112/77 124/70 (!) 105/52 (!) 149/68  Pulse: 93 80 72 86  Resp: 15 16  16   Temp: 98 F (36.7 C) 98 F (36.7 C)    TempSrc: Oral Oral    SpO2: 98% 100% 99% 98%  Weight:      Height:       No intake or output data in the 24 hours ending 04/08/20 1257 Filed Weights   04/05/20 1705 04/06/20 1902  Weight: 47.6 kg 48.1 kg    Exam:  General: Appearance:    Thin female in no acute distress     Lungs:     respirations unlabored  Heart:    Normal heart rate. rrr. No murmurs, rubs, or gallops.   MS:   All extremities are intact.   Neurologic:   Awake, alert, oriented x 3. Very hard of hearing    Data Reviewed:   I have personally reviewed following  labs and imaging studies:  Labs: Labs show the following:   Basic Metabolic Panel: Recent Labs  Lab 04/01/20 1352 04/05/20 1726 04/06/20 0703 04/08/20 0707  NA 131* 130* 134* 131*  K 4.0 4.2 4.6 3.6  CL 92* 95* 101 97*  CO2 29 26 24 25   GLUCOSE 122 103* 98 89  BUN 17 16 8 14   CREATININE 0.59* 0.51 0.48 0.56  CALCIUM 9.8 9.9 9.0 8.7*  MG 2.2  --  2.2  --    GFR Estimated Creatinine Clearance: 46.1 mL/min (by C-G formula based on SCr of 0.56 mg/dL). Liver Function Tests: Recent Labs  Lab 04/01/20 1352  AST 21  ALT 15  BILITOT 0.5  PROT 7.2   No results for input(s): LIPASE, AMYLASE in the last 168 hours. No results for input(s): AMMONIA in the last 168 hours. Coagulation profile No results for input(s): INR, PROTIME in the last 168 hours.  CBC: Recent Labs  Lab 04/01/20 1352  04/05/20 1726 04/06/20 0703  WBC 7.7 6.2 6.0  NEUTROABS 6,483  --   --   HGB 14.6 14.4 13.8  HCT 43.8 41.8 39.7  MCV 91.4 90.3 89.8  PLT 262 257 228   Cardiac Enzymes: No results for input(s): CKTOTAL, CKMB, CKMBINDEX, TROPONINI in the last 168 hours. BNP (last 3 results) No results for input(s): PROBNP in the last 8760 hours. CBG: No results for input(s): GLUCAP in the last 168 hours. D-Dimer: No results for input(s): DDIMER in the last 72 hours. Hgb A1c: No results for input(s): HGBA1C in the last 72 hours. Lipid Profile: No results for input(s): CHOL, HDL, LDLCALC, TRIG, CHOLHDL, LDLDIRECT in the last 72 hours. Thyroid function studies: No results for input(s): TSH, T4TOTAL, T3FREE, THYROIDAB in the last 72 hours.  Invalid input(s): FREET3 Anemia work up: No results for input(s): VITAMINB12, FOLATE, FERRITIN, TIBC, IRON, RETICCTPCT in the last 72 hours. Sepsis Labs: Recent Labs  Lab 04/01/20 1352 04/05/20 1726 04/06/20 0703  WBC 7.7 6.2 6.0    Microbiology Recent Results (from the past 240 hour(s))  SARS-COV-2 RNA,(COVID-19) QUAL NAAT     Status: None   Collection Time: 04/01/20  2:17 PM  Result Value Ref Range Status   SARS CoV2 RNA Not Detected Not Detect Final    Comment: . A Not Detected (negative) test result for this test means that SARS-CoV-2 RNA was not present in the specimen above the limit of detection. A negative result does not rule out the possibility of COVID-19 and should not be used as the sole basis for treatment or patient management decisions.  If COVID-19 is still suspected, based on exposure history together with other clinical findings, re-testing should be considered in consultation with public health authorities. Laboratory test results should always be considered in the context of clinical observations and epidemiological data in making a final diagnosis and patient management decisions. . Please review the "Fact Sheets" and FDA  authorized labeling available for health care providers and patients using the following websites: https://www.[facility].com/home/Covid-19/HCP/ [facility]/fact-sheet https://www.[facility].com/home/Covid-19/ Patients/QuestLDT/fact-sheet.html . This test has been authorized  by the FDA under an Emergency Use Authorization (EUA) for use by authorized laboratories. . Due to the current public health emergency, [facility] is receiving a high volume of samples from a wide variety of swabs and media for COVID-19 testing. In order to serve patients during this public health crisis, samples from appropriate clinical sources are being tested. Negative test results derived from specimens received in non-commercially manufactured viral collection and  transport media, or in media and sample collection kits not yet authorized by FDA for COVID-19 testing should be cautiously evaluated and the patient potentially subjected to extra precautions such as additional clinical monitoring, including collection of an additional specimen. . Methodology:  Nucleic Acid Amplification Test (NAAT) includes RT-PCR or TMA . Additional information about COVID-19 can be found at the [employer] website: www.[facility].com/Covid19   SARS Coronavirus 2 by RT PCR (hospital order, performed in [facility] hospital lab) Nasopharyngeal Nasopharyngeal Swab     Status: None   Collection Time: 04/06/20  3:57 AM   Specimen: Nasopharyngeal Swab  Result Value Ref Range Status   SARS Coronavirus 2 NEGATIVE NEGATIVE Final    Comment: (NOTE) SARS-CoV-2 target nucleic acids are NOT DETECTED.  The SARS-CoV-2 RNA is generally detectable in upper and lower respiratory specimens during the acute phase of infection. The lowest concentration of SARS-CoV-2 viral copies this assay can detect is 250 copies / mL. A negative result does not preclude SARS-CoV-2 infection and should not be used as the  sole basis for treatment or other patient management decisions.  A negative result may occur with improper specimen collection / handling, submission of specimen other than nasopharyngeal swab, presence of viral mutation(s) within the areas targeted by this assay, and inadequate number of viral copies (<250 copies / mL). A negative result must be combined with clinical observations, patient history, and epidemiological information.  Fact Sheet for Patients:   StrictlyIdeas.no  Fact Sheet for Healthcare Providers: BankingDealers.co.za  This test is not yet approved or  cleared by the [place] FDA and has been authorized for detection and/or diagnosis of SARS-CoV-2 by FDA under an Emergency Use Authorization (EUA).  This EUA will remain in effect (meaning this test can be used) for the duration of the COVID-19 declaration under Section 564(b)(1) of the Act, 21 U.S.C. section 360bbb-3(b)(1), unless the authorization is terminated or revoked sooner.  Performed at [facility], [address], [place], [place] [address]     Procedures and diagnostic studies:  CT ANGIO CHEST PE W OR WO CONTRAST  Result Date: 04/08/2020 CLINICAL DATA:  Shortness of breath, chest heaviness. EXAM: CT ANGIOGRAPHY CHEST WITH CONTRAST TECHNIQUE: Multidetector CT imaging of the chest was performed using the standard protocol during bolus administration of intravenous contrast. Multiplanar CT image reconstructions and MIPs were obtained to evaluate the vascular anatomy. CONTRAST:  59mL OMNIPAQUE IOHEXOL 350 MG/ML SOLN COMPARISON:  None. FINDINGS: Cardiovascular: Satisfactory opacification of the pulmonary arteries to the segmental level. No evidence of pulmonary embolism. Normal heart size. No pericardial effusion. Atherosclerosis of thoracic aorta is noted without aneurysm formation. Mediastinum/Nodes: No enlarged mediastinal, hilar, or axillary lymph nodes.  Thyroid gland, trachea, and esophagus demonstrate no significant findings. Lungs/Pleura: No pneumothorax or pleural effusion is noted. Mild to moderate bilateral apical scarring is noted. Upper Abdomen: No acute abnormality. Musculoskeletal: No chest wall abnormality. No acute or significant osseous findings. Review of the MIP images confirms the above findings. IMPRESSION: 1. No definite evidence of pulmonary embolus. 2. Mild to moderate bilateral apical scarring is noted. 3. Aortic atherosclerosis. Aortic Atherosclerosis (ICD10-I70.0). Electronically Signed   By: Marijo Conception M.D.   On: 04/08/2020 12:09   ECHOCARDIOGRAM COMPLETE  Result Date: 04/06/2020    ECHOCARDIOGRAM REPORT   Patient Name:   Alicia [name] Date of Exam: 04/06/2020 Medical Rec #:  [id number]         Height:       66.0 in  Accession #:    [id number]        Weight:       105.0 lb Date of Birth:  1945-12-14        BSA:          1.521 m Patient Age:    34 years          BP:           113/65 mmHg Patient Gender: F                 HR:           82 bpm. Exam Location:  Inpatient Procedure: 2D Echo, Color Doppler and Cardiac Doppler Indications:    R07.9* Chest pain, unspecified  History:        Patient has prior history of Echocardiogram examinations, most                 recent 07/11/2012. Arrythmias:Atrial Fibrillation; Risk                 Factors:Dyslipidemia.  Sonographer:    Raquel Sarna Senior RDCS Referring Phys: [id number] [place]  Sonographer Comments: Very poor echo windows due to rib shadowing and implants. Patient unable to tolerate any probe pressure. IMPRESSIONS  1. Left ventricular ejection fraction, by estimation, is 60 to 65%. The left ventricle has normal function. The left ventricle has no regional wall motion abnormalities. Left ventricular diastolic parameters were normal.  2. Right ventricular systolic function is normal. The right ventricular size is normal.  3. The mitral valve is normal in structure. No evidence of mitral valve  regurgitation. No evidence of mitral stenosis.  4. The aortic valve is normal in structure. Aortic valve regurgitation is not visualized. No aortic stenosis is present.  5. The inferior vena cava is normal in size with greater than 50% respiratory variability, suggesting right atrial pressure of 3 mmHg. FINDINGS  Left Ventricle: Left ventricular ejection fraction, by estimation, is 60 to 65%. The left ventricle has normal function. The left ventricle has no regional wall motion abnormalities. The left ventricular internal cavity size was normal in size. There is  no left ventricular hypertrophy. Left ventricular diastolic parameters were normal. Indeterminate filling pressures. Right Ventricle: The right ventricular size is normal. No increase in right ventricular wall thickness. Right ventricular systolic function is normal. Left Atrium: Left atrial size was normal in size. Right Atrium: Right atrial size was normal in size. Pericardium: There is no evidence of pericardial effusion. Mitral Valve: The mitral valve is normal in structure. Mild mitral annular calcification. No evidence of mitral valve regurgitation. No evidence of mitral valve stenosis. Tricuspid Valve: The tricuspid valve is normal in structure. Tricuspid valve regurgitation is not demonstrated. No evidence of tricuspid stenosis. Aortic Valve: The aortic valve is normal in structure. Aortic valve regurgitation is not visualized. No aortic stenosis is present. Pulmonic Valve: The pulmonic valve was normal in structure. Pulmonic valve regurgitation is not visualized. No evidence of pulmonic stenosis. Aorta: The aortic root is normal in size and structure. Venous: The inferior vena cava is normal in size with greater than 50% respiratory variability, suggesting right atrial pressure of 3 mmHg. IAS/Shunts: No atrial level shunt detected by color flow Doppler.  LEFT VENTRICLE PLAX 2D LVIDd:         3.80 cm  Diastology LVIDs:         2.60 cm  LV e' medial:     6.64 cm/s LV PW:  0.80 cm  LV E/e' medial:  14.2 LV IVS:        0.80 cm  LV e' lateral:   8.16 cm/s LVOT diam:     2.00 cm  LV E/e' lateral: 11.6 LV SV:         66 LV SV Index:   43 LVOT Area:     3.14 cm  RIGHT VENTRICLE RV S prime:     13.50 cm/s TAPSE (M-mode): 2.6 cm LEFT ATRIUM           Index       RIGHT ATRIUM           Index LA diam:      2.60 cm 1.71 cm/m  RA Area:     14.90 cm LA Vol (A4C): 39.2 ml 25.78 ml/m RA Volume:   38.50 ml  25.32 ml/m  AORTIC VALVE LVOT Vmax:   88.80 cm/s LVOT Vmean:  62.300 cm/s LVOT VTI:    0.209 m  AORTA Ao Root diam: 2.30 cm MITRAL VALVE MV Area (PHT): 4.60 cm    SHUNTS MV Decel Time: 165 msec    Systemic VTI:  0.21 m MV E velocity: 94.30 cm/s  Systemic Diam: 2.00 cm MV A velocity: 80.60 cm/s MV E/A ratio:  1.17 Mihai Croitoru MD Electronically signed by Sanda Klein MD Signature Date/Time: 04/06/2020/3:47:55 PM    Final     Medications:   . apixaban  5 mg Oral BID  . baclofen  10 mg Oral QHS  . calcium carbonate  1 tablet Oral BID WC  . diltiazem  120 mg Oral Daily  . feeding supplement  237 mL Oral BID BM  . gabapentin  200 mg Oral QHS  . multivitamin with minerals  1 tablet Oral Daily   Continuous Infusions:   LOS: 2 days   Geradine Girt  [facility]   How to contact the [facility] Attending or Consulting provider [place] or covering provider during after hours [place], for this patient?  1. Check the care team in [facility] and look for a) attending/consulting TR[facility] provider listed and b) the [facility] team listed 2. Log into [email or web address] and use [place]'s universal password to access. If you do not have the password, please contact the hospital operator. 3. Locate the [facility] provider you are looking for under [facility] and page to a number that you can be directly reached. 4. If you still have difficulty reaching the provider, please page the [facility] (Director on Call) for the Hospitalists listed on amion for assistance.  04/08/2020, 12:57  PM

## 2020-04-08 NOTE — Progress Notes (Addendum)
1 SL nitroglycerine  Given. Per order; Alicia [name] PAC    in to see and evaluate pt. Pt c/o of Chest pain/discomfort described as steady pain on the right side but on and off on the left side. EKG done with some changes noted vs prior EKGs. BP 141/65. SR still at 63. Text paged Alicia [name]. PAC to update.

## 2020-04-08 NOTE — Care Management (Signed)
[id number] 04-08-20 Benefits check submitted for Eliquis. Case Manager will follow for cost. Graves-Bigelow, Ocie Cornfield, RN, BSN Case Manager

## 2020-04-08 NOTE — Progress Notes (Signed)
  Pt had complained of blurry vision, hot and sweaty, swimmy headed. Pt states " I'm, about to pass out. BP at 105/51. Previously 124/70. Spoken with Dr. Meda Coffee at the desk. Will continue to follow-up.

## 2020-04-08 NOTE — Plan of Care (Signed)
  Problem: Activity: Goal: Risk for activity intolerance will decrease Outcome: Progressing   Problem: Nutrition: Goal: Adequate nutrition will be maintained Outcome: Progressing   Problem: Coping: Goal: Level of anxiety will decrease Outcome: Progressing   Problem: Elimination: Goal: Will not experience complications related to bowel motility Outcome: Progressing   

## 2020-04-08 NOTE — Progress Notes (Addendum)
   Called about patient having intermittent chest pain and abnormal EKG. Received EKG showed sinus rhythm with new ST elevation in V2-V3. Asked for repeat EKG to be done and then showed resolution of these ST elevation (did show T wave inversions in V2 which was seen on tracing from 2/1). Went to bedside to see patient. She appears comfortably. Currently chest pain free but reports intermittent pain that she has trouble describing. Sometimes will hurt under right breast and sometimes will hurt under left breast. We did give 1 dose of sublingual Nitro. Will also give dose of Tylenol. Will repeat high-sensitivity troponin. She had chest CTA today which was negative for PE. Dr. Meda Coffee will review to see if there are any coronary calcifications present.   Darreld Mclean, PA-C 04/08/2020 4:29 PM  Addendum:  Dr. Meda Coffee reviewed CTA - no significant coronary calcification. Given recurrent symptoms, we will plan for coronary CTA tomorrow to rule out CAD. Will give Ivabradine 10mg  two hours prior to CTA if needed.   Darreld Mclean, PA-C 04/08/2020 4:40 PM

## 2020-04-08 NOTE — Progress Notes (Addendum)
Pt got up to void, HR sustaining 120-140 BPM. Stated that she is "having shortness of breath and feels like her heart is beating out of her chest". Placed on 2L [place], obtaining EKG, Akhter, MD paged- new orders given see MAR.   Elaina Hoops, RN

## 2020-04-08 NOTE — Progress Notes (Addendum)
   04/08/20 0424  Assess: MEWS Score  Temp 97.6 F (36.4 C)  BP 123/88  Pulse Rate (!) 109  ECG Heart Rate (!) 114  Resp 15  Level of Consciousness Alert  SpO2 100 %  O2 Device Nasal Cannula  O2 Flow Rate (L/min) 2 L/min  Assess: MEWS Score  MEWS Temp 0  MEWS Systolic 0  MEWS Pulse 2  MEWS RR 0  MEWS LOC 0  MEWS Score 2  MEWS Score Color Yellow  Assess: if the MEWS score is Yellow or Red  Were vital signs taken at a resting state? Yes  Focused Assessment Change from prior assessment (see assessment flowsheet)  Early Detection of Sepsis Score *See Row Information* Low  MEWS guidelines implemented *See Row Information* Yes  Take Vital Signs  Increase Vital Sign Frequency  Yellow: Q 2hr X 2 then Q 4hr X 2, if remains yellow, continue Q 4hrs  Escalate  MEWS: Escalate Yellow: discuss with charge nurse/RN and consider discussing with provider and RRT  Notify: Charge Nurse/RN  Name of Charge Nurse/RN Notified Standard, RN  Date Charge Nurse/RN Notified 04/08/20  Time Charge Nurse/RN Notified 0300  Notify: Provider  Provider Name/Title Ahkter, MD  Date Provider Notified 04/08/20  Time Provider Notified [phone]  Notification Type Page  Notification Reason Change in status  Response See new orders  Date of Provider Response 04/08/20  Time of Provider Response 0400  Document  Patient Outcome Stabilized after interventions  Progress note created (see row info) Yes   Elaina Hoops, RN

## 2020-04-08 NOTE — TOC Benefit Eligibility Note (Signed)
Transition of Care [facility]) Benefit Eligibility Note    Patient Details  Name: Alicia [name] MRN: [id number] Date of Birth: [birth year]   Medication/Dose: Arne [place]  5 MG BID  Covered?: Yes  Tier: 2 Drug  Prescription Coverage Preferred Pharmacy: [facility]  90 DAY SUPPLY FOR M/O $34.00  Spoke with Person/Company/Phone Number:: [employer] @ [place] QU # [phone]  Co-Pay: $38.00  Prior Approval: No  Deductible: Met       Memory Argue Phone Number: 04/08/2020, 11:22 AM

## 2020-04-09 ENCOUNTER — Inpatient Hospital Stay (HOSPITAL_COMMUNITY): Payer: Medicare Other

## 2020-04-09 DIAGNOSIS — G7100 Muscular dystrophy, unspecified: Secondary | ICD-10-CM | POA: Diagnosis not present

## 2020-04-09 DIAGNOSIS — R079 Chest pain, unspecified: Secondary | ICD-10-CM

## 2020-04-09 DIAGNOSIS — E871 Hypo-osmolality and hyponatremia: Secondary | ICD-10-CM | POA: Diagnosis not present

## 2020-04-09 DIAGNOSIS — I700 Atherosclerosis of aorta: Secondary | ICD-10-CM

## 2020-04-09 DIAGNOSIS — R0789 Other chest pain: Secondary | ICD-10-CM | POA: Diagnosis not present

## 2020-04-09 DIAGNOSIS — I4891 Unspecified atrial fibrillation: Secondary | ICD-10-CM | POA: Diagnosis not present

## 2020-04-09 LAB — BASIC METABOLIC PANEL
Anion gap: 9 (ref 5–15)
BUN: 14 mg/dL (ref 8–23)
CO2: 26 mmol/L (ref 22–32)
Calcium: 9.1 mg/dL (ref 8.9–10.3)
Chloride: 100 mmol/L (ref 98–111)
Creatinine, Ser: 0.62 mg/dL (ref 0.44–1.00)
GFR, Estimated: >60 mL/min (ref >60)
Glucose, Bld: 88 mg/dL (ref 70–99)
Potassium: 4.5 mmol/L (ref 3.5–5.1)
Sodium: 135 mmol/L (ref 135–145)

## 2020-04-09 MED ORDER — NITROGLYCERIN 0.4 MG SL SUBL: 0.8000 mg | SUBLINGUAL_TABLET | Freq: Once | SUBLINGUAL | Status: AC

## 2020-04-09 MED ORDER — HYDROXYZINE HCL 10 MG PO TABS
10.0000 mg | ORAL_TABLET | Freq: Three times a day (TID) | ORAL | Status: DC | PRN
  Filled 2020-04-09: qty 1

## 2020-04-09 MED ORDER — IOHEXOL 350 MG/ML SOLN
80.0000 mL | Freq: Once | INTRAVENOUS | Status: AC | PRN
  Administered 2020-04-09: 80 mL via INTRAVENOUS

## 2020-04-09 MED ORDER — NITROGLYCERIN 0.4 MG SL SUBL
SUBLINGUAL_TABLET | SUBLINGUAL | Status: AC
  Filled 2020-04-09: qty 2

## 2020-04-09 NOTE — Progress Notes (Signed)
Progress Note    Alicia [name]  ZOX:[id number] DOB: 03-17-1945  DOA: 04/05/2020 PCP: Lauree Chandler, NP    Brief Narrative:    Medical records reviewed and are as summarized below:  Alicia [name] is an 75 y.o. female with history of muscular dystrophy, peripheral neuropathy, chronic hyponatremia who presented to the ED with 1 week of shortness of breath with palpitations, nonproductive cough, and intermittent left-sided chest pressure.  She was seen in the ED found to have new onset A. fib with RVR. Hospital stay has been complicated by continued chest discomfort as well as orthostatic hypotension.    Assessment/Plan:   Principal Problem:   Atrial fibrillation with RVR (HCC) Active Problems:   Muscular dystrophy (HCC)   Chronic hyponatremia   Atypical chest pain   Atrial fibrillation with RVR complicated by orthostatic hypotension  -converted back to sinus -appreciate cardiology input, there was plan to increase Cardizem to 240 mg but patient was orthostatic and symptomatic and dizzy so plan is to keep on 120 mg-- may need to change to metoprolol - Continue on Eliquis.  High sensitive troponin negative x3, echo with normal LVEF and wall motion.  -Electrolytes and TSH normal  Chest pain along with palpitation, negative serial troponin.  ACS ruled out.   -CTA pending -defer to cards  Muscular dystrophy continue home baclofen/robaxin. -PT Eval  Chronic hyponatremia: Likely from very low solute diet at home for hypertension.  Okay to liberalize to regular diet.  Sodium improved.     Family Communication/Anticipated D/C date and plan/Code Status   DVT prophylaxis: eliquis Code Status: Full Code.  Disposition Plan: Status is: Inpatient Spoke with son 2/3- please call Remains inpatient appropriate because:Inpatient level of care appropriate due to severity of illness   Dispo: The patient is from: Home              Anticipated d/c is to: Home               Anticipated d/c date is:1- 2 days              Patient currently is not medically stable to d/c.await cards eval   Difficult to place patient No         Medical Consultants:    cards  Subjective:   Left sitting in chair this AM and felt SOB  Objective:    Vitals:   04/09/20 0259 04/09/20 0346 04/09/20 0800 04/09/20 1316  BP: (!) 166/80 139/73 (!) 153/76 (!) 144/70  Pulse: (!) 48 76 74 72  Resp:  19 16 17   Temp: (!) 97.5 F (36.4 C) 98 F (36.7 C) 97.8 F (36.6 C) (!) 97.5 F (36.4 C)  TempSrc: Oral Oral Oral Axillary  SpO2: 94% 97% 98% 100%  Weight:  51 kg    Height:        Intake/Output Summary (Last 24 hours) at 04/09/2020 1521 Last data filed at 04/09/2020 1300 Gross per 24 hour  Intake 510 ml  Output 700 ml  Net -190 ml   Filed Weights   04/05/20 1705 04/06/20 1902 04/09/20 0346  Weight: 47.6 kg 48.1 kg 51 kg    Exam:  General: Appearance:    Thin female tearful at times     Lungs:     On [place], mild increased work of breathing  Heart:    Normal heart rate. rrr No murmurs, rubs, or gallops.   MS:   All extremities are intact.  Neurologic:   Awake, alert      Data Reviewed:   I have personally reviewed following labs and imaging studies:  Labs: Labs show the following:   Basic Metabolic Panel: Recent Labs  Lab 04/05/20 1726 04/06/20 0703 04/08/20 0707 04/09/20 0245  NA 130* 134* 131* 135  K 4.2 4.6 3.6 4.5  CL 95* 101 97* 100  CO2 26 24 25 26   GLUCOSE 103* 98 89 88  BUN 16 8 14 14   CREATININE 0.51 0.48 0.56 0.62  CALCIUM 9.9 9.0 8.7* 9.1  MG  --  2.2  --   --    GFR Estimated Creatinine Clearance: 48.9 mL/min (by C-G formula based on SCr of 0.62 mg/dL). Liver Function Tests: No results for input(s): AST, ALT, ALKPHOS, BILITOT, PROT, ALBUMIN in the last 168 hours. No results for input(s): LIPASE, AMYLASE in the last 168 hours. No results for input(s): AMMONIA in the last 168 hours. Coagulation profile No results for input(s):  INR, PROTIME in the last 168 hours.  CBC: Recent Labs  Lab 04/05/20 1726 04/06/20 0703  WBC 6.2 6.0  HGB 14.4 13.8  HCT 41.8 39.7  MCV 90.3 89.8  PLT 257 228   Cardiac Enzymes: No results for input(s): CKTOTAL, CKMB, CKMBINDEX, TROPONINI in the last 168 hours. BNP (last 3 results) No results for input(s): PROBNP in the last 8760 hours. CBG: No results for input(s): GLUCAP in the last 168 hours. D-Dimer: No results for input(s): DDIMER in the last 72 hours. Hgb A1c: No results for input(s): HGBA1C in the last 72 hours. Lipid Profile: No results for input(s): CHOL, HDL, LDLCALC, TRIG, CHOLHDL, LDLDIRECT in the last 72 hours. Thyroid function studies: No results for input(s): TSH, T4TOTAL, T3FREE, THYROIDAB in the last 72 hours.  Invalid input(s): FREET3 Anemia work up: No results for input(s): VITAMINB12, FOLATE, FERRITIN, TIBC, IRON, RETICCTPCT in the last 72 hours. Sepsis Labs: Recent Labs  Lab 04/05/20 1726 04/06/20 0703  WBC 6.2 6.0    Microbiology Recent Results (from the past 240 hour(s))  SARS-COV-2 RNA,(COVID-19) QUAL NAAT     Status: None   Collection Time: 04/01/20  2:17 PM  Result Value Ref Range Status   SARS CoV2 RNA Not Detected Not Detect Final    Comment: . A Not Detected (negative) test result for this test means that SARS-CoV-2 RNA was not present in the specimen above the limit of detection. A negative result does not rule out the possibility of COVID-19 and should not be used as the sole basis for treatment or patient management decisions.  If COVID-19 is still suspected, based on exposure history together with other clinical findings, re-testing should be considered in consultation with public health authorities. Laboratory test results should always be considered in the context of clinical observations and epidemiological data in making a final diagnosis and patient management decisions. . Please review the "Fact Sheets" and FDA  authorized labeling available for health care providers and patients using the following websites: https://www.[facility].com/home/Covid-19/HCP/ [facility]/fact-sheet https://www.[facility].com/home/Covid-19/ Patients/QuestLDT/fact-sheet.html . This test has been authorized  by the FDA under an Emergency Use Authorization (EUA) for use by authorized laboratories. . Due to the current public health emergency, [facility] is receiving a high volume of samples from a wide variety of swabs and media for COVID-19 testing. In order to serve patients during this public health crisis, samples from appropriate clinical sources are being tested. Negative test results derived from specimens received in non-commercially manufactured viral collection and transport media, or  in media and sample collection kits not yet authorized by FDA for COVID-19 testing should be cautiously evaluated and the patient potentially subjected to extra precautions such as additional clinical monitoring, including collection of an additional specimen. . Methodology:  Nucleic Acid Amplification Test (NAAT) includes RT-PCR or TMA . Additional information about COVID-19 can be found at the [employer] website: www.[facility].com/Covid19   SARS Coronavirus 2 by RT PCR (hospital order, performed in [facility] hospital lab) Nasopharyngeal Nasopharyngeal Swab     Status: None   Collection Time: 04/06/20  3:57 AM   Specimen: Nasopharyngeal Swab  Result Value Ref Range Status   SARS Coronavirus 2 NEGATIVE NEGATIVE Final    Comment: (NOTE) SARS-CoV-2 target nucleic acids are NOT DETECTED.  The SARS-CoV-2 RNA is generally detectable in upper and lower respiratory specimens during the acute phase of infection. The lowest concentration of SARS-CoV-2 viral copies this assay can detect is 250 copies / mL. A negative result does not preclude SARS-CoV-2 infection and should not be used as the  sole basis for treatment or other patient management decisions.  A negative result may occur with improper specimen collection / handling, submission of specimen other than nasopharyngeal swab, presence of viral mutation(s) within the areas targeted by this assay, and inadequate number of viral copies (<250 copies / mL). A negative result must be combined with clinical observations, patient history, and epidemiological information.  Fact Sheet for Patients:   StrictlyIdeas.no  Fact Sheet for Healthcare Providers: BankingDealers.co.za  This test is not yet approved or  cleared by the [place] FDA and has been authorized for detection and/or diagnosis of SARS-CoV-2 by FDA under an Emergency Use Authorization (EUA).  This EUA will remain in effect (meaning this test can be used) for the duration of the COVID-19 declaration under Section 564(b)(1) of the Act, 21 U.S.C. section 360bbb-3(b)(1), unless the authorization is terminated or revoked sooner.  Performed at [facility], [place] [address] [place], [place] [address]     Procedures and diagnostic studies:  CT ANGIO CHEST PE W OR WO CONTRAST  Result Date: 04/08/2020 CLINICAL DATA:  Shortness of breath, chest heaviness. EXAM: CT ANGIOGRAPHY CHEST WITH CONTRAST TECHNIQUE: Multidetector CT imaging of the chest was performed using the standard protocol during bolus administration of intravenous contrast. Multiplanar CT image reconstructions and MIPs were obtained to evaluate the vascular anatomy. CONTRAST:  20mL OMNIPAQUE IOHEXOL 350 MG/ML SOLN COMPARISON:  None. FINDINGS: Cardiovascular: Satisfactory opacification of the pulmonary arteries to the segmental level. No evidence of pulmonary embolism. Normal heart size. No pericardial effusion. Atherosclerosis of thoracic aorta is noted without aneurysm formation. Mediastinum/Nodes: No enlarged mediastinal, hilar, or axillary lymph nodes.  Thyroid gland, trachea, and esophagus demonstrate no significant findings. Lungs/Pleura: No pneumothorax or pleural effusion is noted. Mild to moderate bilateral apical scarring is noted. Upper Abdomen: No acute abnormality. Musculoskeletal: No chest wall abnormality. No acute or significant osseous findings. Review of the MIP images confirms the above findings. IMPRESSION: 1. No definite evidence of pulmonary embolus. 2. Mild to moderate bilateral apical scarring is noted. 3. Aortic atherosclerosis. Aortic Atherosclerosis (ICD10-I70.0). Electronically Signed   By: Marijo Conception M.D.   On: 04/08/2020 12:09   CT CORONARY MORPH W/CTA COR W/SCORE W/CA W/CM &/OR WO/CM  Addendum Date: 04/09/2020   ADDENDUM REPORT: 04/09/2020 11:49 CLINICAL DATA:  75 year old female with shortness of breath and palpitation. EXAM: Cardiac/Coronary  CTA TECHNIQUE: The patient was scanned on a [employer]. FINDINGS: A 100 kV  prospective scan was triggered in the descending thoracic aorta at 111 HU's. Axial non-contrast 3 mm slices were carried out through the heart. The data set was analyzed on a dedicated work station and scored using the [place]. Gantry rotation speed was 250 msecs and collimation was .6 mm. 10 mg of PO Ivabradine and 0.8 mg of sl NTG were given. The 3D data set was reconstructed in 5% intervals of the 67-82 % of the R-R cycle. Diastolic phases were analyzed on a dedicated work station using MPR, MIP and VRT modes. The patient received 80 cc of contrast. Aorta: Normal size. Mild diffuse atherosclerotic plaque with diffuse calcifications. No dissection. Aortic Valve:  Trileaflet.  Mild cusp calcifications. Coronary Arteries:  Normal coronary origin.  Right dominance. RCA is a large dominant artery that gives rise to PDA and PLA. There is no plaque. Left main is a large artery that gives rise to LAD and LCX arteries. Let main has no plaque. LAD is a large tortuous vessel that gives rise to two diagonal  arteries and has no plaque. D1,2 are medium size arteries with no plaque. LCX is a very small non-dominant artery that has no plaque. Other findings: Normal pulmonary vein drainage into the left atrium. Normal left atrial appendage without a thrombus. 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 IMPRESSION: 1. Coronary calcium score of 0. This was 0 percentile for age and sex matched control. 2. Normal coronary origin with right dominance. 3. CAD-RADS 0. No evidence of CAD (0%). Consider non-atherosclerotic causes of chest pain. 4. 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 Electronically Signed   By: Ena Dawley   On: 04/09/2020 11:49   Result Date: 04/09/2020 EXAM: OVER-READ INTERPRETATION  CT CHEST The following report is an over-read performed by radiologist Dr. Vinnie Langton of [facility], [place] on 04/09/2020. This over-read does not include interpretation of cardiac or coronary anatomy or pathology. The coronary calcium score/coronary CTA interpretation by the cardiologist is attached. COMPARISON:  None. FINDINGS: Aortic atherosclerosis. Within the visualized portions of the thorax there are no suspicious appearing pulmonary nodules or masses, there is no acute consolidative airspace disease, no pleural effusions, no pneumothorax and no lymphadenopathy. Visualized portions of the upper abdomen are unremarkable. There are no aggressive appearing lytic or blastic lesions noted in the visualized portions of the skeleton. Bilateral breast implants with dense capsular calcifications are incidentally noted. IMPRESSION: 1.  Aortic Atherosclerosis (ICD10-I70.0). Electronically Signed: By: Vinnie Langton M.D. On: 04/09/2020 11:02    Medications:   . apixaban  5 mg Oral BID  . baclofen  10 mg Oral QHS  . calcium carbonate  1 tablet Oral BID WC  . diltiazem  120 mg Oral Daily  . feeding supplement  237 mL Oral BID BM  . gabapentin  200 mg Oral QHS  . hydrocortisone cream  1 application  Topical BID  . multivitamin with minerals  1 tablet Oral Daily  . nitroGLYCERIN       Continuous Infusions:   LOS: 3 days   Geradine Girt  [facility]   How to contact the [facility] Attending or Consulting provider [place] or covering provider during after hours [place], for this patient?  1. Check the care team in [facility] and look for a) attending/consulting [facility] provider listed and b) the [facility] team listed 2. Log into [email or web address] and use [place]'s universal password to access. If you do not have the password, please contact the hospital operator.  3. Locate the [facility] provider you are looking for under [facility] and page to a number that you can be directly reached. 4. If you still have difficulty reaching the provider, please page the [facility] (Director on Call) for the Hospitalists listed on amion for assistance.  04/09/2020, 3:21 PM

## 2020-04-09 NOTE — Progress Notes (Signed)
PT Cancellation Note  Patient Details Name: Alicia [name] MRN: [id number] DOB: 1945-09-11   Cancelled Treatment:    Reason Eval/Treat Not Completed: Other (comment) Attempted to see pt x3 today, in AM pt had just come back from CTA and was dizzy after taking 2 nitroglycerine and waiting to eat lunch. On 2nd and 3rd attempts, pt sleeping. Per nurse, pt did not sleep well last night and had a busy morning. Will follow.   Marguarite Arbour A Kiara Mcdowell 04/09/2020, 3:24 PM Marisa Severin, PT, DPT Acute Rehabilitation Services Pager [phone] Office [phone]

## 2020-04-09 NOTE — Progress Notes (Addendum)
Progress Note  Patient Name: Alicia [name] Date of Encounter: 04/09/2020  [facility] Cardiologist: New (Previously seen by Dr. Mare Ferrari)  Subjective   No acute ischemic changes. No recurrent chest pain since yesterday afternoon. She denies feeling short of breath at rest but patient was placed on supplement O2 via nasal cannula this morning because her O2 sats were dropping overnight. No palpitations. No recurrent atrial fibrillation/flutter in the last 24 hours.  Inpatient Medications    Scheduled Meds: . apixaban  5 mg Oral BID  . baclofen  10 mg Oral QHS  . calcium carbonate  1 tablet Oral BID WC  . diltiazem  120 mg Oral Daily  . feeding supplement  237 mL Oral BID BM  . gabapentin  200 mg Oral QHS  . hydrocortisone cream  1 application Topical BID  . multivitamin with minerals  1 tablet Oral Daily   Continuous Infusions:  PRN Meds: acetaminophen **OR** acetaminophen, butalbital-acetaminophen-caffeine, ivabradine, meclizine, methocarbamol, SUMAtriptan   Vital Signs    Vitals:   04/08/20 2312 04/08/20 2313 04/09/20 0259 04/09/20 0346  BP:  (!) 166/70 (!) 166/80 139/73  Pulse:  81 (!) 48 76  Resp:  18  19  Temp: 98.2 F (36.8 C) 98.2 F (36.8 C) (!) 97.5 F (36.4 C) 98 F (36.7 C)  TempSrc: Oral Oral Oral Oral  SpO2:  100% 94% 97%  Weight:    51 kg  Height:        Intake/Output Summary (Last 24 hours) at 04/09/2020 0725 Last data filed at 04/09/2020 0300 Gross per 24 hour  Intake 120 ml  Output 700 ml  Net -580 ml   Last 3 Weights 04/09/2020 04/06/2020 04/05/2020  Weight (lbs) 112 lb 7 oz 106 lb 105 lb  Weight (kg) 51 kg 48.081 kg 47.628 kg      Telemetry    Normal sinus rhythm with rates in the 60's to 70's at rest. - Personally Reviewed  ECG    No new ECG tracings today. - Personally Reviewed  Physical Exam   GEN: No acute distress.   Neck: Supple. No JVD Cardiac: RRR. No murmurs, rubs, or gallops. Radial pulses 2+ and equal  bilaterally. Respiratory: Clear to auscultation bilaterally. No wheezes, rhonchi, rales. GI: Soft,non-distended, and non-tender. MS: No lower extremity edema. No deformity. Skin: Warm and dry. Neuro:  No focal deficits. Psych: Normal affect. Responds appropriately.  Labs    High Sensitivity Troponin:   Recent Labs  Lab 04/05/20 1726 04/05/20 2126 04/06/20 0703 04/08/20 1649  TROPONINIHS 5 5 8 6       Chemistry Recent Labs  Lab 04/06/20 0703 04/08/20 0707 04/09/20 0245  NA 134* 131* 135  K 4.6 3.6 4.5  CL 101 97* 100  CO2 24 25 26   GLUCOSE 98 89 88  BUN 8 14 14   CREATININE 0.48 0.56 0.62  CALCIUM 9.0 8.7* 9.1  GFRNONAA >60 >60 >60  ANIONGAP 9 9 9      Hematology Recent Labs  Lab 04/05/20 1726 04/06/20 0703  WBC 6.2 6.0  RBC 4.63 4.42  HGB 14.4 13.8  HCT 41.8 39.7  MCV 90.3 89.8  MCH 31.1 31.2  MCHC 34.4 34.8  RDW 13.2 13.4  PLT 257 228    BNPNo results for input(s): BNP, PROBNP in the last 168 hours.   DDimer No results for input(s): DDIMER in the last 168 hours.   Radiology    CT ANGIO CHEST PE W OR WO CONTRAST  Result Date:  04/08/2020 CLINICAL DATA:  Shortness of breath, chest heaviness. EXAM: CT ANGIOGRAPHY CHEST WITH CONTRAST TECHNIQUE: Multidetector CT imaging of the chest was performed using the standard protocol during bolus administration of intravenous contrast. Multiplanar CT image reconstructions and MIPs were obtained to evaluate the vascular anatomy. CONTRAST:  76mL OMNIPAQUE IOHEXOL 350 MG/ML SOLN COMPARISON:  None. FINDINGS: Cardiovascular: Satisfactory opacification of the pulmonary arteries to the segmental level. No evidence of pulmonary embolism. Normal heart size. No pericardial effusion. Atherosclerosis of thoracic aorta is noted without aneurysm formation. Mediastinum/Nodes: No enlarged mediastinal, hilar, or axillary lymph nodes. Thyroid gland, trachea, and esophagus demonstrate no significant findings. Lungs/Pleura: No pneumothorax or  pleural effusion is noted. Mild to moderate bilateral apical scarring is noted. Upper Abdomen: No acute abnormality. Musculoskeletal: No chest wall abnormality. No acute or significant osseous findings. Review of the MIP images confirms the above findings. IMPRESSION: 1. No definite evidence of pulmonary embolus. 2. Mild to moderate bilateral apical scarring is noted. 3. Aortic atherosclerosis. Aortic Atherosclerosis (ICD10-I70.0). Electronically Signed   By: Marijo Conception M.D.   On: 04/08/2020 12:09    Cardiac Studies   Echocardiogram 04/06/2020: Impressions: 1. Left ventricular ejection fraction, by estimation, is 60 to 65%. The  left ventricle has normal function. The left ventricle has no regional  wall motion abnormalities. Left ventricular diastolic parameters were  normal.  2. Right ventricular systolic function is normal. The right ventricular  size is normal.  3. The mitral valve is normal in structure. No evidence of mitral valve  regurgitation. No evidence of mitral stenosis.  4. The aortic valve is normal in structure. Aortic valve regurgitation is  not visualized. No aortic stenosis is present.  5. The inferior vena cava is normal in size with greater than 50%  respiratory variability, suggesting right atrial pressure of 3 mmHg.   Patient Profile     Ms. 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 Lupita Leash.  Assessment & Plan    New Onset Atrial Fibrillation - Converted to sinus rhythm yesterday after being started on Cardizem. Went back into atrial fibrillation/flutter early on 2/3 but spontaneously converted back to sinus rhythm after a few hours. However, has converted back to sinus rhythm. Maintaining sinus rhythm this morning with  rates in the 60's to 70's. - Echo showed LVEF of 60-65% with normal wall motion.  - Potassium 4.5 today. - Magnesium 2.2 on 2/1. - TSH normal. - Continue PO Cardizem 120mg  daily. - CHA2DS2-VASc = 3 (age x2, female). Continue Eliquis 5mg  twice daily.  Chest Pain - Patient presented with severe chest pain with her palpitations. She had recurrent episode of chest pain yesterday after. It was atypical - intermittent under both right and left breast at rest. No recurrent chest pain since yesterday. - EKG yesterday afternoon after episode chest pain showed ST elevations in V2-V3 but on repeat these had resolved. High-sensitivity troponin negative. - Echo with normal LVEF and wall motion. - Chest CTA yesterday negative for PE but did show mild to moderate bilateral apical scarring. - Given recurrent symptoms, plan is for coronary CTA today to rule out CAD. Will give a dose of Ivabradine 20mg  2 hours prior to test to help with heart rate if needed.  Orthostatic Hypotension - Patient was slightly orthostatic on 2/2  with systolic BP dropping from 126 when sitting to 106 when standing. She was dizzy with this. - Repeat orthostatics yesterday were negative but BP was soft. - BP is more elevated today. If remains elevated, may be able to increase Cardizem but will hold off at this time given orthostasis earlier this admission.  Otherwise, per primary team: - Chronic hypernatremia: stable - Muscular dystrophy - Meniere's disease  For questions or updates, please contact [place] Please consult [email or web address] for contact info under     Signed, Darreld Mclean, PA-C  04/09/2020, 7:25 AM    The patient was seen, examined and discussed with Darreld Mclean, PA-C  and I agree with the above.   The patient remains in [place], however she continues to be SOB, chest CTA to rule our PE was negative yesterday - no definite evidence of pulmonary embolus. She is on Eliquis already for atrial  fibrillation so in case there is a small PE not identified on CT, she would be covered. She just underwent a CCTA to evaluate for obstructive CAD, I will read it shortly. Orthostatic hypotension has improved.  Ena Dawley, MD 04/09/2020  Addendum:  The patient's coronary CTA shows no evidence for coronary artery disease whatsoever, at this point we would recommend to continue current regimen.  We will sign off and arrange for outpatient follow-up within next 2 weeks.  Ena Dawley, MD 04/09/2020

## 2020-04-10 DIAGNOSIS — I4891 Unspecified atrial fibrillation: Secondary | ICD-10-CM | POA: Diagnosis not present

## 2020-04-10 MED ORDER — MECLIZINE HCL 25 MG PO TABS
25.0000 mg | ORAL_TABLET | Freq: Three times a day (TID) | ORAL | Status: DC | PRN
  Administered 2020-04-11: 25 mg via ORAL
  Filled 2020-04-10 (×2): qty 1

## 2020-04-10 MED ORDER — SODIUM CHLORIDE 0.9 % IV BOLUS
500.0000 mL | Freq: Once | INTRAVENOUS | Status: AC
  Administered 2020-04-10: 500 mL via INTRAVENOUS

## 2020-04-10 MED ORDER — ONDANSETRON 4 MG PO TBDP
4.0000 mg | ORAL_TABLET | Freq: Three times a day (TID) | ORAL | Status: DC | PRN
  Administered 2020-04-10 – 2020-04-11 (×2): 4 mg via ORAL
  Filled 2020-04-10 (×4): qty 1

## 2020-04-10 NOTE — Progress Notes (Signed)
Pt.c/o itchiness  & redness from the telemetry leads..MD on call made aware & ordered Hydroxyxine but pt.refused to take it bec.she is scared that the side effects might be like Benadryl which she is allergic to. & she refused to have tele placed  Back.Will cont.to monitor pt.

## 2020-04-10 NOTE — Progress Notes (Signed)
Progress Note    Alicia [name]  IFO:[id number] DOB: [birth year]  DOA: 04/05/2020 PCP: Lauree Chandler, NP    Brief Narrative:    Medical records reviewed and are as summarized below:  Alicia [name] is an 75 y.o. female with history of muscular dystrophy, peripheral neuropathy, chronic hyponatremia who presented to the ED with 1 week of shortness of breath with palpitations, nonproductive cough, and intermittent left-sided chest pressure.  She was seen in the ED found to have new onset A. fib with RVR. Hospital stay has been complicated by continued chest discomfort as well as orthostatic hypotension.    Assessment/Plan:   Principal Problem:   Atrial fibrillation with RVR (HCC) Active Problems:   Muscular dystrophy (HCC)   Chronic hyponatremia   Atypical chest pain   Atrial fibrillation with RVR complicated by orthostatic hypotension  -converted back to sinus -appreciate cardiology input, there was plan to increase Cardizem to 240 mg but patient was orthostatic and symptomatic and dizzy so plan is to keep on 120 mg-- may need to change to metoprolol - Continue on Eliquis.  High sensitive troponin negative x3, echo with normal LVEF and wall motion.  -Electrolytes and TSH normal.  2/5 HR well controlled. Denies chest pain.   Chest pain along with palpitation, negative serial troponin.  ACS ruled out.   -CTA pending -defer to cards 2/5 Patient with coronary calcium score of 0. Cardiology following.   Muscular dystrophy continue home baclofen/robaxin. -PT Eval 2/5 Patient yet to have PT.   Chronic hyponatremia: Likely from very low solute diet at home for hypertension.  Okay to liberalize to regular diet.  Sodium improved.  Vertigo  2/5 Reports as a chronic issue for her. Meclizine dose increased to 25 mg.  Patient with nausea and non bilious emesis today, resolved after zofran started. Denies abdominal pain. Will continue meclizine. Has had orthostatic  hypotension as well, needs NS IV bolus.      Family Communication/Anticipated D/C date and plan/Code Status   DVT prophylaxis: eliquis Code Status: Full Code.  Disposition Plan: Status is: Inpatient Spoke with son 2/3- will update tomorrow.  Remains inpatient appropriate because:Inpatient level of care appropriate due to severity of illness   Dispo: The patient is from: Home              Anticipated d/c is to: Home              Anticipated d/c date is:1- 2 days              Patient currently is not medically stable to d/c.await cards eval   Difficult to place patient No         Medical Consultants:    cards  Subjective:   Patient with nausea today, improved after zofran. Denies chest pain. O2 sats stable on RA.   Objective:    Vitals:   04/09/20 1919 04/10/20 0635 04/10/20 0936 04/10/20 1237  BP: 133/70 (!) 153/73 (!) 154/69 137/62  Pulse: 80 78 88 88  Resp: 18 18 18 16   Temp: 98.2 F (36.8 C) 97.6 F (36.4 C) (!) 97.3 F (36.3 C) 98.3 F (36.8 C)  TempSrc: Oral Oral Oral Oral  SpO2: 97% 100% 100% 100%  Weight:  48.4 kg    Height:       No intake or output data in the 24 hours ending 04/10/20 1629 Filed Weights   04/06/20 1902 04/09/20 0346 04/10/20 0635  Weight: 48.1 kg 51  kg 48.4 kg    Exam:  General: Appearance:    Thin female tearful at times.      Lungs:     No cough, normal rate, on RA with stable O2 sats.   Heart:    Normal heart rate. S1 and S2 present.    MS:   All extremities are intact. No edema.   Neurologic:   Awake, alert      Data Reviewed:   I have personally reviewed following labs and imaging studies:  Labs: Labs show the following:   Basic Metabolic Panel: Recent Labs  Lab 04/05/20 1726 04/06/20 0703 04/08/20 0707 04/09/20 0245  NA 130* 134* 131* 135  K 4.2 4.6 3.6 4.5  CL 95* 101 97* 100  CO2 26 24 25 26   GLUCOSE 103* 98 89 88  BUN 16 8 14 14   CREATININE 0.51 0.48 0.56 0.62  CALCIUM 9.9 9.0 8.7* 9.1  MG   --  2.2  --   --    GFR Estimated Creatinine Clearance: 46.4 mL/min (by C-G formula based on SCr of 0.62 mg/dL). Liver Function Tests: No results for input(s): AST, ALT, ALKPHOS, BILITOT, PROT, ALBUMIN in the last 168 hours. No results for input(s): LIPASE, AMYLASE in the last 168 hours. No results for input(s): AMMONIA in the last 168 hours. Coagulation profile No results for input(s): INR, PROTIME in the last 168 hours.  CBC: Recent Labs  Lab 04/05/20 1726 04/06/20 0703  WBC 6.2 6.0  HGB 14.4 13.8  HCT 41.8 39.7  MCV 90.3 89.8  PLT 257 228   Cardiac Enzymes: No results for input(s): CKTOTAL, CKMB, CKMBINDEX, TROPONINI in the last 168 hours. BNP (last 3 results) No results for input(s): PROBNP in the last 8760 hours. CBG: No results for input(s): GLUCAP in the last 168 hours. D-Dimer: No results for input(s): DDIMER in the last 72 hours. Hgb A1c: No results for input(s): HGBA1C in the last 72 hours. Lipid Profile: No results for input(s): CHOL, HDL, LDLCALC, TRIG, CHOLHDL, LDLDIRECT in the last 72 hours. Thyroid function studies: No results for input(s): TSH, T4TOTAL, T3FREE, THYROIDAB in the last 72 hours.  Invalid input(s): FREET3 Anemia work up: No results for input(s): VITAMINB12, FOLATE, FERRITIN, TIBC, IRON, RETICCTPCT in the last 72 hours. Sepsis Labs: Recent Labs  Lab 04/05/20 1726 04/06/20 0703  WBC 6.2 6.0    Microbiology Recent Results (from the past 240 hour(s))  SARS-COV-2 RNA,(COVID-19) QUAL NAAT     Status: None   Collection Time: 04/01/20  2:17 PM  Result Value Ref Range Status   SARS CoV2 RNA Not Detected Not Detect Final    Comment: . A Not Detected (negative) test result for this test means that SARS-CoV-2 RNA was not present in the specimen above the limit of detection. A negative result does not rule out the possibility of COVID-19 and should not be used as the sole basis for treatment or patient management decisions.  If COVID-19 is  still suspected, based on exposure history together with other clinical findings, re-testing should be considered in consultation with public health authorities. Laboratory test results should always be considered in the context of clinical observations and epidemiological data in making a final diagnosis and patient management decisions. . Please review the "Fact Sheets" and FDA authorized labeling available for health care providers and patients using the following websites: https://www.[facility].com/home/Covid-19/HCP/ [facility]/fact-sheet https://www.[facility].com/home/Covid-19/ Patients/[facility]/fact-sheet.html . This test has been authorized  by the FDA under an Emergency Use Authorization (EUA) for use  by authorized laboratories. . Due to the current public health emergency, [facility] is receiving a high volume of samples from a wide variety of swabs and media for COVID-19 testing. In order to serve patients during this public health crisis, samples from appropriate clinical sources are being tested. Negative test results derived from specimens received in non-commercially manufactured viral collection and transport media, or in media and sample collection kits not yet authorized by FDA for COVID-19 testing should be cautiously evaluated and the patient potentially subjected to extra precautions such as additional clinical monitoring, including collection of an additional specimen. . Methodology:  Nucleic Acid Amplification Test (NAAT) includes RT-PCR or TMA . Additional information about COVID-19 can be found at the [employer] website: www.[facility].com/Covid19   SARS Coronavirus 2 by RT PCR (hospital order, performed in [facility] hospital lab) Nasopharyngeal Nasopharyngeal Swab     Status: None   Collection Time: 04/06/20  3:57 AM   Specimen: Nasopharyngeal Swab  Result Value Ref Range Status   SARS Coronavirus 2 NEGATIVE  NEGATIVE Final    Comment: (NOTE) SARS-CoV-2 target nucleic acids are NOT DETECTED.  The SARS-CoV-2 RNA is generally detectable in upper and lower respiratory specimens during the acute phase of infection. The lowest concentration of SARS-CoV-2 viral copies this assay can detect is 250 copies / mL. A negative result does not preclude SARS-CoV-2 infection and should not be used as the sole basis for treatment or other patient management decisions.  A negative result may occur with improper specimen collection / handling, submission of specimen other than nasopharyngeal swab, presence of viral mutation(s) within the areas targeted by this assay, and inadequate number of viral copies (<250 copies / mL). A negative result must be combined with clinical observations, patient history, and epidemiological information.  Fact Sheet for Patients:   StrictlyIdeas.no  Fact Sheet for Healthcare Providers: BankingDealers.co.za  This test is not yet approved or  cleared by the [place] FDA and has been authorized for detection and/or diagnosis of SARS-CoV-2 by FDA under an Emergency Use Authorization (EUA).  This EUA will remain in effect (meaning this test can be used) for the duration of the COVID-19 declaration under Section 564(b)(1) of the Act, 21 U.S.C. section 360bbb-3(b)(1), unless the authorization is terminated or revoked sooner.  Performed at [facility], [address], [place], [place] [address]     Procedures and diagnostic studies:  CT CORONARY MORPH W/CTA COR W/SCORE W/CA W/CM &/OR WO/CM  Addendum Date: 04/09/2020   ADDENDUM REPORT: 04/09/2020 11:49 CLINICAL DATA:  75 year old female with shortness of breath and palpitation. EXAM: Cardiac/Coronary  CTA TECHNIQUE: The patient was scanned on a [employer]. FINDINGS: A 100 kV prospective scan was triggered in the descending thoracic aorta at 111 HU's. Axial  non-contrast 3 mm slices were carried out through the heart. The data set was analyzed on a dedicated work station and scored using the [employer]. Gantry rotation speed was 250 msecs and collimation was .6 mm. 10 mg of PO Ivabradine and 0.8 mg of sl NTG were given. The 3D data set was reconstructed in 5% intervals of the 67-82 % of the R-R cycle. Diastolic phases were analyzed on a dedicated work station using MPR, MIP and VRT modes. The patient received 80 cc of contrast. Aorta: Normal size. Mild diffuse atherosclerotic plaque with diffuse calcifications. No dissection. Aortic Valve:  Trileaflet.  Mild cusp calcifications. Coronary Arteries:  Normal coronary origin.  Right dominance. RCA is a large  dominant artery that gives rise to PDA and PLA. There is no plaque. Left main is a large artery that gives rise to LAD and LCX arteries. Let main has no plaque. LAD is a large tortuous vessel that gives rise to two diagonal arteries and has no plaque. D1,2 are medium size arteries with no plaque. LCX is a very small non-dominant artery that has no plaque. Other findings: Normal pulmonary vein drainage into the left atrium. Normal left atrial appendage without a thrombus. 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 IMPRESSION: 1. Coronary calcium score of 0. This was 0 percentile for age and sex matched control. 2. Normal coronary origin with right dominance. 3. CAD-RADS 0. No evidence of CAD (0%). Consider non-atherosclerotic causes of chest pain. 4. 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 Electronically Signed   By: Ena Dawley   On: 04/09/2020 11:49   Result Date: 04/09/2020 EXAM: OVER-READ INTERPRETATION  CT CHEST The following report is an over-read performed by radiologist Dr. Vinnie Langton of [facility], [place] on 04/09/2020. This over-read does not include interpretation of cardiac or coronary anatomy or pathology. The coronary calcium score/coronary CTA interpretation by the  cardiologist is attached. COMPARISON:  None. FINDINGS: Aortic atherosclerosis. Within the visualized portions of the thorax there are no suspicious appearing pulmonary nodules or masses, there is no acute consolidative airspace disease, no pleural effusions, no pneumothorax and no lymphadenopathy. Visualized portions of the upper abdomen are unremarkable. There are no aggressive appearing lytic or blastic lesions noted in the visualized portions of the skeleton. Bilateral breast implants with dense capsular calcifications are incidentally noted. IMPRESSION: 1.  Aortic Atherosclerosis (ICD10-I70.0). Electronically Signed: By: Vinnie Langton M.D. On: 04/09/2020 11:02    Medications:   . apixaban  5 mg Oral BID  . baclofen  10 mg Oral QHS  . calcium carbonate  1 tablet Oral BID WC  . diltiazem  120 mg Oral Daily  . feeding supplement  237 mL Oral BID BM  . gabapentin  200 mg Oral QHS  . hydrocortisone cream  1 application Topical BID  . multivitamin with minerals  1 tablet Oral Daily   Continuous Infusions:   LOS: 4 days   Blain Pais, MD [facility]   How to contact the [facility] Attending or Consulting provider [place] or covering provider during after hours [place], for this patient?  1. Check the care team in [facility] and look for a) attending/consulting TRH provider listed and b) the [facility] team listed 2. Log into [email or web address] and use [place]'s universal password to access. If you do not have the password, please contact the hospital operator. 3. Locate the [facility] provider you are looking for under [facility] and page to a number that you can be directly reached. 4. If you still have difficulty reaching the provider, please page the [facility] (Director on Call) for the Hospitalists listed on amion for assistance.  04/10/2020, 4:29 PM

## 2020-04-10 NOTE — Progress Notes (Addendum)
PT Cancellation Note  Patient Details Name: FADUMO HENG MRN: [id number] DOB: 01/02/1946   Cancelled Treatment:    Reason Eval/Treat Not Completed: Other (comment) (pt vomitting) will attempt in PM  Attempted at 15:21 pt asleep and unable to arouse to participate in therapy  Lyanne Co, DPT Acute Rehabilitation Services [phone]  [place] 04/10/2020, 10:26 AM

## 2020-04-10 NOTE — Plan of Care (Signed)
  Problem: Clinical Measurements: Goal: Diagnostic test results will improve Outcome: Progressing Goal: Respiratory complications will improve Outcome: Progressing   Problem: Nutrition: Goal: Adequate nutrition will be maintained Outcome: Progressing   

## 2020-04-11 DIAGNOSIS — I4891 Unspecified atrial fibrillation: Secondary | ICD-10-CM | POA: Diagnosis not present

## 2020-04-11 LAB — BASIC METABOLIC PANEL
Anion gap: 10 (ref 5–15)
BUN: 11 mg/dL (ref 8–23)
CO2: 21 mmol/L — ABNORMAL LOW (ref 22–32)
Calcium: 9 mg/dL (ref 8.9–10.3)
Chloride: 96 mmol/L — ABNORMAL LOW (ref 98–111)
Creatinine, Ser: 0.51 mg/dL (ref 0.44–1.00)
GFR, Estimated: >60 mL/min (ref >60)
Glucose, Bld: 160 mg/dL — ABNORMAL HIGH (ref 70–99)
Potassium: 4.3 mmol/L (ref 3.5–5.1)
Sodium: 127 mmol/L — ABNORMAL LOW (ref 135–145)

## 2020-04-11 MED ORDER — SODIUM CHLORIDE 1 G PO TABS
1.0000 g | ORAL_TABLET | Freq: Two times a day (BID) | ORAL | Status: DC
  Administered 2020-04-11 – 2020-04-12 (×2): 1 g via ORAL
  Filled 2020-04-11 (×3): qty 1

## 2020-04-11 NOTE — Progress Notes (Signed)
Progress Note    Alicia [name]  ACZ:[id number] DOB: 11/12/1945  DOA: 04/05/2020 PCP: [name], NP    Brief Narrative:    Medical records reviewed and are as summarized below:  Alicia [name] is an 75 y.o. female with history of muscular dystrophy, peripheral neuropathy, chronic hyponatremia who presented to the ED with 1 week of shortness of breath with palpitations, nonproductive cough, and intermittent left-sided chest pressure.  She was seen in the ED found to have new onset A. fib with RVR. Hospital stay has been complicated by continued chest discomfort as well as orthostatic hypotension.    Assessment/Plan:   Principal Problem:   Atrial fibrillation with RVR (HCC) Active Problems:   Muscular dystrophy (HCC)   Chronic hyponatremia   Atypical chest pain   Atrial fibrillation with RVR complicated by orthostatic hypotension  -converted back to sinus -appreciate cardiology input, there was plan to increase Cardizem to 240 mg but patient was orthostatic and symptomatic and dizzy so plan is to keep on 120 mg-- may need to change to metoprolol - Continue on Eliquis.  High sensitive troponin negative x3, echo with normal LVEF and wall motion.  -Electrolytes and TSH normal.  2/5 HR well controlled. Denies chest pain.  2/6 HR stable, no chest pain.   Chest pain along with palpitation, negative serial troponin.  ACS ruled out.   -CTA pending -defer to cards 2/5 Patient with coronary calcium score of 0. Cardiology following.  2/6 No recurrent chest pain.   Muscular dystrophy continue home baclofen/robaxin. -PT Eval 2/5 Patient yet to have PT.  2/6 She had PT eval today and did well. They recommend HH PT. Patient would benefit from [facility] RN as well.   Chronic hyponatremia: Likely from very low solute diet at home for hypertension.  Okay to liberalize to regular diet.  Sodium improved. 2/6 Sodium level had been normal but is now low again to 127 in setting on  poor po intake yesterday. Started on sodium tablets (patient takes it home intermittently). Repeat labs in am.   Vertigo  2/5 Reports as a chronic issue for her. Meclizine dose increased to 25 mg.  Patient with nausea and non bilious emesis today, resolved after zofran started. Denies abdominal pain. Will continue meclizine. Has had orthostatic hypotension as well, needs NS IV bolus.  2/6 Continue meclizine prn.    Cognitive decline/memory issues 2/6 Patient with subtle memory lapses. Per her son, the family has witnesses some memory issues for the past 2 years. The patient's father had Altzheimer's  disease. The patient does not have a regular PCP per her son's report and it is unclear if she has had neurocognitive testing. Per her son's request, placed request for ambulatory referral to PCP.   Migraine headaches.  The patient reports seeing a Neurologist for long-standing history of migraine headaches. She had been on maxalt and it is unclear how often she had been taking it and how it has affected her HR at home. She has prn fiorecet and received one dose today but, unclear if this will be the best option for her. She reports having an appointment with Neurology on March 1st but requests Rx for a few days for prn treatment of migraine headaches.       Family Communication/Anticipated D/C date and plan/Code Status   DVT prophylaxis: eliquis Code Status: Full Code.  Disposition Plan: Status is: Inpatient Spoke with son 2/3- will update tomorrow.  Remains inpatient appropriate because:Inpatient  level of care appropriate due to severity of illness   Dispo: The patient is from: Home              Anticipated d/c is to: Home              Anticipated d/c date is:1- 2 days              Patient currently is not medically stable to d/c.  Await improvement of sodium level. Will need HH PT and RN. Ambulatory referral to PCP requests per family.    Difficult to place patient No          Medical Consultants:    cards  Subjective:   Patient with migraine this afternoon, reports that it improved with Fioricet. Denies N/V, tolerating po intake.   Objective:    Vitals:   04/10/20 2012 04/10/20 2138 04/11/20 0535 04/11/20 0934  BP: (!) 149/67 (!) 145/76 (!) 149/71 117/62  Pulse:   76 77  Resp: 19  20 19   Temp: 98.6 F (37 C)  98.6 F (37 C) 97.7 F (36.5 C)  TempSrc: Oral  Oral Oral  SpO2: 99% 98% 100% 100%  Weight:   47 kg   Height:        Intake/Output Summary (Last 24 hours) at 04/11/2020 1827 Last data filed at 04/11/2020 1557 Gross per 24 hour  Intake 75 ml  Output 1051 ml  Net -976 ml   Filed Weights   04/09/20 0346 04/10/20 0635 04/11/20 0535  Weight: 51 kg 48.4 kg 47 kg    Exam:  General: Appearance:    Thin female NAD.      Lungs:     No cough, normal rate, on RA with stable O2 sats.   Heart:    Normal heart rate. S1 and S2 present.    MS:   All extremities are intact. No edema.   Neurologic:   Awake, alert.     Data Reviewed:   I have personally reviewed following labs and imaging studies:  Labs: Labs show the following:   Basic Metabolic Panel: Recent Labs  Lab 04/05/20 1726 04/06/20 0703 04/08/20 0707 04/09/20 0245 04/11/20 0258  NA 130* 134* 131* 135 127*  K 4.2 4.6 3.6 4.5 4.3  CL 95* 101 97* 100 96*  CO2 26 24 25 26  21*  GLUCOSE 103* 98 89 88 160*  BUN 16 8 14 14 11   CREATININE 0.51 0.48 0.56 0.62 0.51  CALCIUM 9.9 9.0 8.7* 9.1 9.0  MG  --  2.2  --   --   --    GFR Estimated Creatinine Clearance: 45.1 mL/min (by C-G formula based on SCr of 0.51 mg/dL). Liver Function Tests: No results for input(s): AST, ALT, ALKPHOS, BILITOT, PROT, ALBUMIN in the last 168 hours. No results for input(s): LIPASE, AMYLASE in the last 168 hours. No results for input(s): AMMONIA in the last 168 hours. Coagulation profile No results for input(s): INR, PROTIME in the last 168 hours.  CBC: Recent Labs  Lab 04/05/20 1726  04/06/20 0703  WBC 6.2 6.0  HGB 14.4 13.8  HCT 41.8 39.7  MCV 90.3 89.8  PLT 257 228   Cardiac Enzymes: No results for input(s): CKTOTAL, CKMB, CKMBINDEX, TROPONINI in the last 168 hours. BNP (last 3 results) No results for input(s): PROBNP in the last 8760 hours. CBG: No results for input(s): GLUCAP in the last 168 hours. D-Dimer: No results for input(s): DDIMER in the last 72 hours. Hgb  A1c: No results for input(s): HGBA1C in the last 72 hours. Lipid Profile: No results for input(s): CHOL, HDL, LDLCALC, TRIG, CHOLHDL, LDLDIRECT in the last 72 hours. Thyroid function studies: No results for input(s): TSH, T4TOTAL, T3FREE, THYROIDAB in the last 72 hours.  Invalid input(s): FREET3 Anemia work up: No results for input(s): VITAMINB12, FOLATE, FERRITIN, TIBC, IRON, RETICCTPCT in the last 72 hours. Sepsis Labs: Recent Labs  Lab 04/05/20 1726 04/06/20 0703  WBC 6.2 6.0    Microbiology Recent Results (from the past 240 hour(s))  SARS Coronavirus 2 by RT PCR (hospital order, performed in [facility] hospital lab) Nasopharyngeal Nasopharyngeal Swab     Status: None   Collection Time: 04/06/20  3:57 AM   Specimen: Nasopharyngeal Swab  Result Value Ref Range Status   SARS Coronavirus 2 NEGATIVE NEGATIVE Final    Comment: (NOTE) SARS-CoV-2 target nucleic acids are NOT DETECTED.  The SARS-CoV-2 RNA is generally detectable in upper and lower respiratory specimens during the acute phase of infection. The lowest concentration of SARS-CoV-2 viral copies this assay can detect is 250 copies / mL. A negative result does not preclude SARS-CoV-2 infection and should not be used as the sole basis for treatment or other patient management decisions.  A negative result may occur with improper specimen collection / handling, submission of specimen other than nasopharyngeal swab, presence of viral mutation(s) within the areas targeted by this assay, and inadequate number of viral  copies (<250 copies / mL). A negative result must be combined with clinical observations, patient history, and epidemiological information.  Fact Sheet for Patients:   StrictlyIdeas.no  Fact Sheet for Healthcare Providers: BankingDealers.co.za  This test is not yet approved or  cleared by the [place] FDA and has been authorized for detection and/or diagnosis of SARS-CoV-2 by FDA under an Emergency Use Authorization (EUA).  This EUA will remain in effect (meaning this test can be used) for the duration of the COVID-19 declaration under Section 564(b)(1) of the Act, 21 U.S.C. section 360bbb-3(b)(1), unless the authorization is terminated or revoked sooner.  Performed at [facility], [place] [address] [place], [place] [address]     Procedures and diagnostic studies:  No results found.  Medications:   . apixaban  5 mg Oral BID  . baclofen  10 mg Oral QHS  . calcium carbonate  1 tablet Oral BID WC  . diltiazem  120 mg Oral Daily  . feeding supplement  237 mL Oral BID BM  . gabapentin  200 mg Oral QHS  . hydrocortisone cream  1 application Topical BID  . multivitamin with minerals  1 tablet Oral Daily  . sodium chloride  1 g Oral BID WC   Continuous Infusions:   LOS: 5 days     Time spent: 40 minutes including more than 50% of this time on patient care coordination, reviewing labs, discussing the plan of care with the patient, her son (on the phone), and her bedside nurse.   Blain Pais, MD [facility]   How to contact the [facility] Attending or Consulting provider [place] or covering provider during after hours [place], for this patient?  1. Check the care team in [facility] and look for a) attending/consulting TRH provider listed and b) the [facility] team listed 2. Log into [email or web address] and use [place]'s universal password to access. If you do not have the password, please contact the hospital  operator. 3. Locate the [facility] provider you are looking for  under [facility] and page to a number that you can be directly reached. 4. If you still have difficulty reaching the provider, please page the [facility] (Director on Call) for the Hospitalists listed on amion for assistance.  04/11/2020, 6:27 PM

## 2020-04-11 NOTE — Progress Notes (Signed)
Physical Therapy Treatment Patient Details Name: Alicia [name] MRN: [id number] DOB: 08-Nov-1945 Today's Date: 04/11/2020    History of Present Illness Pt is a 75 y/o female admitted secondary to SOB and palpitations. Found to have a fib with RVR. PMH includes muscular dystrophy, meniere's disease, basal cell cancer, and lumbar sugery.    PT Comments    Patient progressing well towards PT goals. Tolerated gait training with use of rollator. Instructed pt on how to lock/unlock brakes, how to fold up, use of bar, and basket under seat per request. Pt with episode of vertigo after returning from longer walk preventing second attempt at ambulation using RW. Pt tolerated some short distance ambulation in room without DME, furniture walking due to some dizziness without difficulty. Has been dealing with vertigo for years so this is nothing new. Encouraged getting rid of throw rugs but pt declines, "that is our style, they will not be a problem." Pt reports she will be staying in the den so not worried about practicing stairs; informed her HHPT will address this with her. VSS on RA throughout. Lengthy discussion re: use of DME in home (older home with narrow doorways), safety when navigating environment, mobility, HHPT expectations,  And problem solving living space/where to stay/sleep etc. Will continue to follow and progress.    Follow Up Recommendations  Home health PT;Supervision - Intermittent     Equipment Recommendations  None recommended by PT (rollator already delivered)    Recommendations for Other Services       Precautions / Restrictions Precautions Precautions: Fall;Other (comment) Precaution Comments: vertigo Restrictions Weight Bearing Restrictions: No    Mobility  Bed Mobility Overal bed mobility: Modified Independent                Transfers Overall transfer level: Needs assistance Equipment used: None Transfers: Sit to/from Stand Sit to Stand: Supervision          General transfer comment: Supervision for safety. Stood from EOB x5-6, LOB once back onto bed. Stood from Nature conservation officer x2, from chair x1.  Ambulation/Gait Ambulation/Gait assistance: Supervision Gait Distance (Feet): 400 Feet (+15' +15') Assistive device: 4-wheeled walker;None;Rolling walker (2 wheeled) Gait Pattern/deviations: Step-through pattern;Decreased stride length   Gait velocity interpretation: 1.31 - 2.62 ft/sec, indicative of limited community ambulator General Gait Details: Slow, steady gait using rollator without any difficulties; instructed pt how to lock/unlock brakes. No dizziness first ambulation bout. Attempted ambulation x2 with use of RW and no DME however pt needed to stop due to onset of vertigo.   Stairs Stairs:  (Not able to tolerate today due to vertigo but family setting up room downstairs in den)           Engineer, building services [place] (Stroke Patients Only)       Balance Overall balance assessment: Needs assistance Sitting-balance support: Feet supported;No upper extremity supported Sitting balance-Leahy Scale: Good     Standing balance support: During functional activity Standing balance-Leahy Scale: Fair Standing balance comment: Able to walk short distances around room furniture walking to steady as needed esp when dizzy.                            Cognition Arousal/Alertness: Awake/alert Behavior During Therapy: WFL for tasks assessed/performed Overall Cognitive Status: Within Functional Limits for tasks assessed  General Comments: Very particular, likes to do things a certain way, tangential.      Exercises      General Comments General comments (skin integrity, edema, etc.): VSS on RA. HR in 80s.      Pertinent Vitals/Pain Pain Assessment: Faces Faces Pain Scale: Hurts little more Pain Location: headache Pain Descriptors / Indicators: Headache Pain  Intervention(s): Monitored during session;Repositioned    Home Living                      Prior Function            PT Goals (current goals can now be found in the care plan section) Progress towards PT goals: Progressing toward goals    Frequency    Min 3X/week      PT Plan Current plan remains appropriate    Co-evaluation              AM-PAC PT "6 Clicks" Mobility   Outcome Measure  Help needed turning from your back to your side while in a flat bed without using bedrails?: None Help needed moving from lying on your back to sitting on the side of a flat bed without using bedrails?: None Help needed moving to and from a bed to a chair (including a wheelchair)?: A Little Help needed standing up from a chair using your arms (e.g., wheelchair or bedside chair)?: A Little Help needed to walk in hospital room?: A Little Help needed climbing 3-5 steps with a railing? : A Little 6 Click Score: 20    End of Session   Activity Tolerance: Patient tolerated treatment well Patient left: in bed;with call bell/phone within reach Nurse Communication: Mobility status PT Visit Diagnosis: Unsteadiness on feet (R26.81);Muscle weakness (generalized) (M62.81)     Time: 3664-4034 PT Time Calculation (min) (ACUTE ONLY): 42 min  Charges:  $Gait Training: 23-37 mins $Therapeutic Activity: 8-22 mins                     Marisa Severin, PT, DPT Acute Rehabilitation Services Pager [phone] Office [place] 04/11/2020, 1:04 PM

## 2020-04-12 DIAGNOSIS — R0789 Other chest pain: Secondary | ICD-10-CM | POA: Diagnosis not present

## 2020-04-12 DIAGNOSIS — E871 Hypo-osmolality and hyponatremia: Secondary | ICD-10-CM | POA: Diagnosis not present

## 2020-04-12 DIAGNOSIS — I4891 Unspecified atrial fibrillation: Secondary | ICD-10-CM | POA: Diagnosis not present

## 2020-04-12 LAB — BASIC METABOLIC PANEL
Anion gap: 9 (ref 5–15)
BUN: 14 mg/dL (ref 8–23)
CO2: 27 mmol/L (ref 22–32)
Calcium: 9.3 mg/dL (ref 8.9–10.3)
Chloride: 97 mmol/L — ABNORMAL LOW (ref 98–111)
Creatinine, Ser: 0.77 mg/dL (ref 0.44–1.00)
GFR, Estimated: >60 mL/min (ref >60)
Glucose, Bld: 87 mg/dL (ref 70–99)
Potassium: 4.5 mmol/L (ref 3.5–5.1)
Sodium: 133 mmol/L — ABNORMAL LOW (ref 135–145)

## 2020-04-12 MED ORDER — SUMATRIPTAN SUCCINATE 50 MG PO TABS: 50.0000 mg | ORAL_TABLET | ORAL | 0 refills | Status: DC | PRN

## 2020-04-12 MED ORDER — ONDANSETRON 4 MG PO TBDP: 4.0000 mg | ORAL_TABLET | Freq: Four times a day (QID) | ORAL | 0 refills | Status: DC | PRN

## 2020-04-12 MED ORDER — APIXABAN 5 MG PO TABS: 5.0000 mg | ORAL_TABLET | Freq: Two times a day (BID) | ORAL | 3 refills | Status: DC

## 2020-04-12 MED ORDER — SODIUM CHLORIDE 1 G PO TABS: 1.0000 g | ORAL_TABLET | Freq: Two times a day (BID) | ORAL | 3 refills | Status: DC

## 2020-04-12 MED ORDER — DILTIAZEM HCL ER COATED BEADS 120 MG PO CP24: 120.0000 mg | ORAL_CAPSULE | Freq: Every day | ORAL | 3 refills | Status: DC

## 2020-04-12 NOTE — Care Management Important Message (Addendum)
Important Message  Patient Details  Name: Alicia [name] MRN: [id number] Date of Birth: Jan 16, 1946   Medicare Important Message Given:  Yes - Pt. Discharged mailed IM letter to home address.     Holli Humbles Smith 04/12/2020, 10:52 AM

## 2020-04-12 NOTE — Progress Notes (Signed)
Discharge instructions (including medications) discussed with and copy provided to patient/caregiver 

## 2020-04-12 NOTE — Plan of Care (Signed)
  Problem: Education: Goal: Knowledge of General Education information will improve Description: Including pain rating scale, medication(s)/side effects and non-pharmacologic comfort measures Outcome: Adequate for Discharge   

## 2020-04-12 NOTE — Discharge Summary (Signed)
Physician Discharge Summary   Patient ID: Alicia [name] MRN: [id number] DOB/AGE: 07/27/1945 75 y.o.  Admit date: 04/05/2020 Discharge date: 04/12/2020  Primary Care Physician:  Lauree Chandler, NP   Recommendations for Outpatient Follow-up:  1. Follow up with PCP in 1-2 weeks  Home Health: Home health PT OT Equipment/Devices:   Discharge Condition: stable CODE STATUS: FULL Diet recommendation: Heart healthy diet   Discharge Diagnoses:    . Atrial fibrillation with RVR ([place]) complicated by orthostatic hypotension . Atypical chest pain secondary to A. fib with RVR . Chronic hyponatremia . Chronic muscular dystrophy (HCC) Vertigo Chronic migraine headaches  Consults: Cardiology    Allergies:   Allergies  Allergen Reactions  . Biaxin [Clarithromycin] Anaphylaxis  . Sulfonamide Derivatives Itching and Rash  . Tequin [Gatifloxacin] Anaphylaxis  . Demerol Nausea And Vomiting  . Lactose Intolerance (Gi) Other (See Comments)    Abdominal pain, gas, bloating  . Nsaids Other (See Comments)    Reaction:  GI upset/heartburn   . Risedronate Sodium Nausea And Vomiting and Other (See Comments)     GI upset/heartburn   . Rofecoxib Nausea And Vomiting and Other (See Comments)    GI upset/heartburn   . Alendronate Sodium Nausea And Vomiting  . Aspirin Other (See Comments)    Abdominal pain   . Fish Allergy Nausea And Vomiting    Can take fish oil  . Benadryl [Diphenhydramine Hcl] Palpitations     DISCHARGE MEDICATIONS: Allergies as of 04/12/2020      Reactions   Biaxin [clarithromycin] Anaphylaxis   Sulfonamide Derivatives Itching, Rash   Tequin [gatifloxacin] Anaphylaxis   Demerol Nausea And Vomiting   Lactose Intolerance (gi) Other (See Comments)   Abdominal pain, gas, bloating   Nsaids Other (See Comments)   Reaction:  GI upset/heartburn    Risedronate Sodium Nausea And Vomiting, Other (See Comments)    GI upset/heartburn    Rofecoxib Nausea And Vomiting,  Other (See Comments)   GI upset/heartburn    Alendronate Sodium Nausea And Vomiting   Aspirin Other (See Comments)   Abdominal pain    Fish Allergy Nausea And Vomiting   Can take fish oil   Benadryl [diphenhydramine Hcl] Palpitations      Medication List    STOP taking these medications   atenolol 25 MG tablet Commonly known as: TENORMIN   rizatriptan 10 MG disintegrating tablet Commonly known as: MAXALT-MLT Replaced by: SUMAtriptan 50 MG tablet     TAKE these medications   apixaban 5 MG Tabs tablet Commonly known as: ELIQUIS Take 1 tablet (5 mg total) by mouth 2 (two) times daily.   AZELAIC ACID EX Apply 1 application topically 2 (two) times daily. Right after metronidazole cream   baclofen 10 MG tablet Commonly known as: LIORESAL Take 10 mg by mouth at bedtime.   Biotin 5000 MCG Tabs Take 5,000 mcg by mouth daily.   butalbital-acetaminophen-caffeine 50-325-40 MG tablet Commonly known as: FIORICET Take 1 tablet by mouth every 6 (six) hours as needed for migraine (for severe pain).   CALCIUM PO Take 1,200 mg by mouth 2 (two) times daily.   Carboxymethylcellulose Sodium 0.25 % Soln Apply 1 drop to eye 3 (three) times daily.   diltiazem 120 MG 24 hr capsule Commonly known as: CARDIZEM CD Take 1 capsule (120 mg total) by mouth daily.   Evening Primrose Oil 1000 MG Caps Take 1,000 mg by mouth daily at 12 noon.   Femring 0.05 MG/24HR Ring Generic drug: Estradiol Acetate Place  1 each vaginally every 3 (three) months.   gabapentin 100 MG capsule Commonly known as: NEURONTIN Take 200 mg by mouth at bedtime.   ipratropium 0.06 % nasal spray Commonly known as: ATROVENT Place 2 sprays into both nostrils 2 (two) times daily as needed for rhinitis.   lactase 3000 units tablet Commonly known as: LACTAID Take 3,000 Units by mouth daily as needed (when eating dairy products).   Lutein 20 MG Tabs Take 20 mg by mouth daily.   MAGNESIUM CITRATE PO Take 300 mg by  mouth at bedtime.   meclizine 12.5 MG tablet Commonly known as: ANTIVERT Take 1 tablet (12.5 mg total) by mouth 3 (three) times daily as needed for dizziness.   methocarbamol 750 MG tablet Commonly known as: ROBAXIN Take 750 mg by mouth every 6 (six) hours.   metroNIDAZOLE 0.75 % gel Commonly known as: METROGEL Apply 1 application topically 2 (two) times daily. Apply to face for rosacea   multivitamin with minerals Tabs tablet Take 1 tablet by mouth daily.   Omega-3 1000 MG Caps Take 1,000 mg by mouth 2 (two) times daily.   ondansetron 4 MG disintegrating tablet Commonly known as: ZOFRAN-ODT Take 1 tablet (4 mg total) by mouth every 6 (six) hours as needed for nausea or vomiting.   PreserVision AREDS 2 Caps Take 1 capsule by mouth 2 (two) times daily.   sodium chloride 1 g tablet Take 1 tablet (1 g total) by mouth 2 (two) times daily with a meal.   SUMAtriptan 50 MG tablet Commonly known as: IMITREX Take 1 tablet (50 mg total) by mouth every 2 (two) hours as needed for migraine. May repeat in 2 hours if headache persists or recurs. Replaces: rizatriptan 10 MG disintegrating tablet   Vitamin D3 Liqd Take 1 drop by mouth See admin instructions. Takes 1 drop by mouth every day except Sat and [place]  (From admission, onward)         Start     Ordered   04/07/20 1636  For home use only DME Other see comment  Once       Comments: Patient needs Rollator  Question:  Length of Need  Answer:  Lifetime   04/07/20 1636           Brief H and P: For complete details please refer to admission H and P, but in brief  Alicia [name] is an 75 y.o. female with history of muscular dystrophy, peripheral neuropathy, chronic hyponatremia who presented to the ED with 1 week of shortness of breath with palpitations, nonproductive cough, and intermittent left-sided chest pressure.  She was seen in the ED found to have new onset A. fib with  RVR. Hospital stay has been complicated by continued chest discomfort as well as orthostatic hypotension.    Hospital Course:   Atrial fibrillation with RVR, new onset, complicated by orthostatic hypotension -Patient presented to ED with 1 week of shortness of breath, palpitations, nonproductive cough and intermittent left-sided chest pressure. -She was found to be in atrial fibrillation with RVR -Patient was placed on Cardizem.  Cardiology was consulted.  Plan was to increase Cardizem to 240 mg however patient was orthostatic and dizzy.  Hence patient has been continued on 120 mg Cardizem. -She has now converted to sinus rhythm.  Heart rate well controlled -TSH normal, 2D echo with normal LVEF 60 to 65%. -Continue eliquis and outpatient follow-up with  cardiology.  Atypical chest pain likely due to atrial fibrillation with RVR -Chest pain currently resolved.  2D echo showed EF of 6065%, no regional wall motion abnormalities -Serial cardiac enzymes negative -CTA chest showed no PE -CT coronary showed coronary calcium score of 0, no evidence of CAD   History of muscular dystrophy -PT evaluation recommended home health PT  Chronic hyponatremia -Patient was placed on sodium tabs.  Sodium is improving, 133 at the time of discharge.   Vertigo -Patient reports this is a chronic issue.  Continue meclizine as needed -Outpatient follow-up with neurology  Migraine headaches -Maxalt was discontinued.  Patient has an appointment with neurology at [facility] She was given a prescription for Imitrex as needed for migraine headaches  Cognitive decline -Subtle memory lapses, currently fully alert and oriented.,  Outpatient follow-up with neurology.     Day of Discharge S: No acute complaints, hoping to go home today.  Normal sinus rhythm.  BP 135/84 (BP Location: Left Arm)   Pulse 71   Temp 97.6 F (36.4 C) (Oral)   Resp 18   Ht 5\' 6"  (1.676 m)   Wt 47 kg   SpO2 99%    BMI 16.74 kg/m   Physical Exam: General: Alert and awake oriented x3 not in any acute distress. HEENT: anicteric sclera, pupils reactive to light and accommodation CVS: S1-S2 clear no murmur rubs or gallops Chest: clear to auscultation bilaterally, no wheezing rales or rhonchi Abdomen: soft nontender, nondistended, normal bowel sounds Extremities: no cyanosis, clubbing or edema noted bilaterally Neuro: Cranial nerves II-XII intact, no focal neurological deficits    Get Medicines reviewed and adjusted: Please take all your medications with you for your next visit with your Primary MD  Please request your Primary MD to go over all hospital tests and procedure/radiological results at the follow up. Please ask your Primary MD to get all Hospital records sent to his/her office.  If you experience worsening of your admission symptoms, develop shortness of breath, life threatening emergency, suicidal or homicidal thoughts you must seek medical attention immediately by calling 911 or calling your MD immediately  if symptoms less severe.  You must read complete instructions/literature along with all the possible adverse reactions/side effects for all the Medicines you take and that have been prescribed to you. Take any new Medicines after you have completely understood and accept all the possible adverse reactions/side effects.   Do not drive when taking pain medications.   Do not take more than prescribed Pain, Sleep and Anxiety Medications  Special Instructions: If you have smoked or chewed Tobacco  in the last 2 yrs please stop smoking, stop any regular Alcohol  and or any Recreational drug use.  Wear Seat belts while driving.  Please note  You were cared for by a hospitalist during your hospital stay. Once you are discharged, your primary care physician will handle any further medical issues. Please note that NO REFILLS for any discharge medications will be authorized once you are  discharged, as it is imperative that you return to your primary care physician (or establish a relationship with a primary care physician if you do not have one) for your aftercare needs so that they can reassess your need for medications and monitor your lab values.   The results of significant diagnostics from this hospitalization (including imaging, microbiology, ancillary and laboratory) are listed below for reference.      Procedures/Studies:  DG Chest 2 View  Result Date: 04/05/2020 CLINICAL  DATA:  Left-sided chest pain, cough, and fatigue for 1 week. EXAM: CHEST - 2 VIEW COMPARISON:  12/13/2018 FINDINGS: The heart size and mediastinal contours are within normal limits. Aortic atherosclerotic calcification noted. Bilateral upper lobe pleural-parenchymal scarring remains stable. Pulmonary hyperinflation is again noted. No evidence of acute infiltrate or edema. No evidence of pleural effusion. Bilateral breast implants again noted. IMPRESSION: COPD and bilateral upper lobe pleural-parenchymal scarring. No active lung disease. Electronically Signed   By: Marlaine Hind M.D.   On: 04/05/2020 18:26   CT ANGIO CHEST PE W OR WO CONTRAST  Result Date: 04/08/2020 CLINICAL DATA:  Shortness of breath, chest heaviness. EXAM: CT ANGIOGRAPHY CHEST WITH CONTRAST TECHNIQUE: Multidetector CT imaging of the chest was performed using the standard protocol during bolus administration of intravenous contrast. Multiplanar CT image reconstructions and MIPs were obtained to evaluate the vascular anatomy. CONTRAST:  70mL OMNIPAQUE IOHEXOL 350 MG/ML SOLN COMPARISON:  None. FINDINGS: Cardiovascular: Satisfactory opacification of the pulmonary arteries to the segmental level. No evidence of pulmonary embolism. Normal heart size. No pericardial effusion. Atherosclerosis of thoracic aorta is noted without aneurysm formation. Mediastinum/Nodes: No enlarged mediastinal, hilar, or axillary lymph nodes. Thyroid gland, trachea, and  esophagus demonstrate no significant findings. Lungs/Pleura: No pneumothorax or pleural effusion is noted. Mild to moderate bilateral apical scarring is noted. Upper Abdomen: No acute abnormality. Musculoskeletal: No chest wall abnormality. No acute or significant osseous findings. Review of the MIP images confirms the above findings. IMPRESSION: 1. No definite evidence of pulmonary embolus. 2. Mild to moderate bilateral apical scarring is noted. 3. Aortic atherosclerosis. Aortic Atherosclerosis (ICD10-I70.0). Electronically Signed   By: Marijo Conception M.D.   On: 04/08/2020 12:09   CT CORONARY MORPH W/CTA COR W/SCORE W/CA W/CM &/OR WO/CM  Addendum Date: 04/09/2020   ADDENDUM REPORT: 04/09/2020 11:49 CLINICAL DATA:  75 year old female with shortness of breath and palpitation. EXAM: Cardiac/Coronary  CTA TECHNIQUE: The patient was scanned on a [employer]. FINDINGS: A 100 kV prospective scan was triggered in the descending thoracic aorta at 111 HU's. Axial non-contrast 3 mm slices were carried out through the heart. The data set was analyzed on a dedicated work station and scored using the [place]. Gantry rotation speed was 250 msecs and collimation was .6 mm. 10 mg of PO Ivabradine and 0.8 mg of sl NTG were given. The 3D data set was reconstructed in 5% intervals of the 67-82 % of the R-R cycle. Diastolic phases were analyzed on a dedicated work station using MPR, MIP and VRT modes. The patient received 80 cc of contrast. Aorta: Normal size. Mild diffuse atherosclerotic plaque with diffuse calcifications. No dissection. Aortic Valve:  Trileaflet.  Mild cusp calcifications. Coronary Arteries:  Normal coronary origin.  Right dominance. RCA is a large dominant artery that gives rise to PDA and PLA. There is no plaque. Left main is a large artery that gives rise to LAD and LCX arteries. Let main has no plaque. LAD is a large tortuous vessel that gives rise to two diagonal arteries and has no plaque.  D1,2 are medium size arteries with no plaque. LCX is a very small non-dominant artery that has no plaque. Other findings: Normal pulmonary vein drainage into the left atrium. Normal left atrial appendage without a thrombus. 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 IMPRESSION: 1. Coronary calcium score of 0. This was 0 percentile for age and sex matched control. 2. Normal coronary origin with right dominance. 3. CAD-RADS 0. No evidence of CAD (  0%). Consider non-atherosclerotic causes of chest pain. 4. 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 Electronically Signed   By: Ena Dawley   On: 04/09/2020 11:49   Result Date: 04/09/2020 EXAM: OVER-READ INTERPRETATION  CT CHEST The following report is an over-read performed by radiologist Dr. Vinnie Langton of [facility], [place] on 04/09/2020. This over-read does not include interpretation of cardiac or coronary anatomy or pathology. The coronary calcium score/coronary CTA interpretation by the cardiologist is attached. COMPARISON:  None. FINDINGS: Aortic atherosclerosis. Within the visualized portions of the thorax there are no suspicious appearing pulmonary nodules or masses, there is no acute consolidative airspace disease, no pleural effusions, no pneumothorax and no lymphadenopathy. Visualized portions of the upper abdomen are unremarkable. There are no aggressive appearing lytic or blastic lesions noted in the visualized portions of the skeleton. Bilateral breast implants with dense capsular calcifications are incidentally noted. IMPRESSION: 1.  Aortic Atherosclerosis (ICD10-I70.0). Electronically Signed: By: Vinnie Langton M.D. On: 04/09/2020 11:02   ECHOCARDIOGRAM COMPLETE  Result Date: 04/06/2020    ECHOCARDIOGRAM REPORT   Patient Name:   Alicia [name] Date of Exam: 04/06/2020 Medical Rec #:  [id number]         Height:       66.0 in Accession #:    [id number]        Weight:       105.0 lb Date of Birth:  [birth year]        BSA:           1.521 m Patient Age:    68 years          BP:           113/65 mmHg Patient Gender: F                 HR:           82 bpm. Exam Location:  Inpatient Procedure: 2D Echo, Color Doppler and Cardiac Doppler Indications:    R07.9* Chest pain, unspecified  History:        Patient has prior history of Echocardiogram examinations, most                 recent 07/11/2012. Arrythmias:Atrial Fibrillation; Risk                 Factors:Dyslipidemia.  Sonographer:    Raquel Sarna Senior RDCS Referring Phys: [id number] [place]  Sonographer Comments: Very poor echo windows due to rib shadowing and implants. Patient unable to tolerate any probe pressure. IMPRESSIONS  1. Left ventricular ejection fraction, by estimation, is 60 to 65%. The left ventricle has normal function. The left ventricle has no regional wall motion abnormalities. Left ventricular diastolic parameters were normal.  2. Right ventricular systolic function is normal. The right ventricular size is normal.  3. The mitral valve is normal in structure. No evidence of mitral valve regurgitation. No evidence of mitral stenosis.  4. The aortic valve is normal in structure. Aortic valve regurgitation is not visualized. No aortic stenosis is present.  5. The inferior vena cava is normal in size with greater than 50% respiratory variability, suggesting right atrial pressure of 3 mmHg. FINDINGS  Left Ventricle: Left ventricular ejection fraction, by estimation, is 60 to 65%. The left ventricle has normal function. The left ventricle has no regional wall motion abnormalities. The left ventricular internal cavity size was normal in size. There is  no left ventricular hypertrophy. Left ventricular diastolic parameters were normal. Indeterminate filling pressures. Right  Ventricle: The right ventricular size is normal. No increase in right ventricular wall thickness. Right ventricular systolic function is normal. Left Atrium: Left atrial size was normal in size. Right Atrium: Right atrial  size was normal in size. Pericardium: There is no evidence of pericardial effusion. Mitral Valve: The mitral valve is normal in structure. Mild mitral annular calcification. No evidence of mitral valve regurgitation. No evidence of mitral valve stenosis. Tricuspid Valve: The tricuspid valve is normal in structure. Tricuspid valve regurgitation is not demonstrated. No evidence of tricuspid stenosis. Aortic Valve: The aortic valve is normal in structure. Aortic valve regurgitation is not visualized. No aortic stenosis is present. Pulmonic Valve: The pulmonic valve was normal in structure. Pulmonic valve regurgitation is not visualized. No evidence of pulmonic stenosis. Aorta: The aortic root is normal in size and structure. Venous: The inferior vena cava is normal in size with greater than 50% respiratory variability, suggesting right atrial pressure of 3 mmHg. IAS/Shunts: No atrial level shunt detected by color flow Doppler.  LEFT VENTRICLE PLAX 2D LVIDd:         3.80 cm  Diastology LVIDs:         2.60 cm  LV e' medial:    6.64 cm/s LV PW:         0.80 cm  LV E/e' medial:  14.2 LV IVS:        0.80 cm  LV e' lateral:   8.16 cm/s LVOT diam:     2.00 cm  LV E/e' lateral: 11.6 LV SV:         66 LV SV Index:   43 LVOT Area:     3.14 cm  RIGHT VENTRICLE RV S prime:     13.50 cm/s TAPSE (M-mode): 2.6 cm LEFT ATRIUM           Index       RIGHT ATRIUM           Index LA diam:      2.60 cm 1.71 cm/m  RA Area:     14.90 cm LA Vol (A4C): 39.2 ml 25.78 ml/m RA Volume:   38.50 ml  25.32 ml/m  AORTIC VALVE LVOT Vmax:   88.80 cm/s LVOT Vmean:  62.300 cm/s LVOT VTI:    0.209 m  AORTA Ao Root diam: 2.30 cm MITRAL VALVE MV Area (PHT): 4.60 cm    SHUNTS MV Decel Time: 165 msec    Systemic VTI:  0.21 m MV E velocity: 94.30 cm/s  Systemic Diam: 2.00 cm MV A velocity: 80.60 cm/s MV E/A ratio:  1.17 Mihai Croitoru MD Electronically signed by Sanda Klein MD Signature Date/Time: 04/06/2020/3:47:55 PM    Final        LAB  RESULTS: Basic Metabolic Panel: Recent Labs  Lab 04/06/20 0703 04/08/20 0707 04/11/20 0258 04/12/20 0126  NA 134*   < > 127* 133*  K 4.6   < > 4.3 4.5  CL 101   < > 96* 97*  CO2 24   < > 21* 27  GLUCOSE 98   < > 160* 87  BUN 8   < > 11 14  CREATININE 0.48   < > 0.51 0.77  CALCIUM 9.0   < > 9.0 9.3  MG 2.2  --   --   --    < > = values in this interval not displayed.   Liver Function Tests: No results for input(s): AST, ALT, ALKPHOS, BILITOT, PROT, ALBUMIN in the last 168 hours.  No results for input(s): LIPASE, AMYLASE in the last 168 hours. No results for input(s): AMMONIA in the last 168 hours. CBC: Recent Labs  Lab 04/05/20 1726 04/06/20 0703  WBC 6.2 6.0  HGB 14.4 13.8  HCT 41.8 39.7  MCV 90.3 89.8  PLT 257 228   Cardiac Enzymes: No results for input(s): CKTOTAL, CKMB, CKMBINDEX, TROPONINI in the last 168 hours. BNP: Invalid input(s): POCBNP CBG: No results for input(s): GLUCAP in the last 168 hours.     Disposition and Follow-up: Discharge Instructions    Diet general   Complete by: As directed    Increase activity slowly   Complete by: As directed        DISPOSITION: Home with home health PT   [place], [facility] Follow up.   Specialty: [place] Why: A representative from [facility] will contact you to arrange start date and time for your therapy. Contact information: [address] [place] [address] [phone]        Imogene Burn, PA-C Follow up.   Specialty: Cardiology Why: Hospital follow-up with Cardiology schduled for 04/28/2020 at 1:45 with Ermalinda Barrios, one of Dr. Francesca Oman PAs. Please arrive 15 minutes early for check-in. If this date/time does not work for you, please call our office to reschedule. Contact information: [address] [place] [address] [phone]        Gayla Medicus, MD. Schedule an appointment as  soon as possible for a visit in 2 week(s).   Specialty: Neurology Contact information: [id number] [place] [address] [phone]                Time coordinating discharge:  35 minutes  Signed:   Estill Cotta M.D. [facility] 04/12/2020, 10:56 AM

## 2020-04-12 NOTE — Discharge Instructions (Signed)

## 2020-04-12 NOTE — TOC Transition Note (Signed)
Transition of Care [facility]) - CM/SW Discharge Note   Patient Details  Name: Alicia [name] MRN: [id number] Date of Birth: 07/02/1945  [facility] CM/SW Contact:  Bethena Roys, RN Phone Number: 04/12/2020, 11:01 AM   Clinical Narrative:  Patient has a nurse practitioner as her primary care provider (PCP). Case Manager provided the patient with the Health Connect Form regarding PCP needs. Patient will call for a new provider if needed. [name] is aware that the patient will transition home today. [name] will call within 24-48 hours post transition home.   Final next level of care: [place] Barriers to Discharge: No Barriers Identified   Patient Goals and CMS Choice Patient states their goals for this hospitalization and ongoing recovery are:: to get better CMS Medicare.gov Compare Post Acute Care list provided to:: Patient Choice offered to / list presented to : Patient  Discharge Plan and Services In-house Referral: NA Discharge Planning Services: CM Consult Post Acute Care Choice: Home Health,Durable Medical Equipment          DME Arranged: Walker rolling with seat DME Agency: [employer] Date DME Agency Contacted: 04/07/20 Time DME Agency Contacted: 1603 Representative spoke with at DME Agency: Freda Munro Selz: PT [place]: [place] Date [place]: 04/07/20 Time [place]: [place] Representative spoke with at [place]: Adela Lank   Readmission Risk Interventions No flowsheet data found.

## 2020-04-13 ENCOUNTER — Telehealth: Payer: Self-pay | Admitting: *Deleted

## 2020-04-13 NOTE — Telephone Encounter (Signed)
I have made the 1st attempt to contact the patient or family member in charge, in order to follow up from recently being discharged from the hospital. I left a message on voicemail but I will make another attempt at a different time.  

## 2020-04-14 DIAGNOSIS — G629 Polyneuropathy, unspecified: Secondary | ICD-10-CM | POA: Diagnosis not present

## 2020-04-14 DIAGNOSIS — Z9181 History of falling: Secondary | ICD-10-CM | POA: Diagnosis not present

## 2020-04-14 DIAGNOSIS — M8580 Other specified disorders of bone density and structure, unspecified site: Secondary | ICD-10-CM | POA: Diagnosis not present

## 2020-04-14 DIAGNOSIS — I4891 Unspecified atrial fibrillation: Secondary | ICD-10-CM

## 2020-04-14 DIAGNOSIS — K5730 Diverticulosis of large intestine without perforation or abscess without bleeding: Secondary | ICD-10-CM | POA: Diagnosis not present

## 2020-04-14 DIAGNOSIS — H903 Sensorineural hearing loss, bilateral: Secondary | ICD-10-CM | POA: Diagnosis not present

## 2020-04-14 DIAGNOSIS — Z7901 Long term (current) use of anticoagulants: Secondary | ICD-10-CM | POA: Diagnosis not present

## 2020-04-14 DIAGNOSIS — G7100 Muscular dystrophy, unspecified: Secondary | ICD-10-CM

## 2020-04-14 DIAGNOSIS — E871 Hypo-osmolality and hyponatremia: Secondary | ICD-10-CM

## 2020-04-14 DIAGNOSIS — G43009 Migraine without aura, not intractable, without status migrainosus: Secondary | ICD-10-CM

## 2020-04-14 DIAGNOSIS — M549 Dorsalgia, unspecified: Secondary | ICD-10-CM | POA: Diagnosis not present

## 2020-04-14 DIAGNOSIS — G8929 Other chronic pain: Secondary | ICD-10-CM

## 2020-04-14 DIAGNOSIS — I951 Orthostatic hypotension: Secondary | ICD-10-CM

## 2020-04-14 DIAGNOSIS — R4181 Age-related cognitive decline: Secondary | ICD-10-CM | POA: Diagnosis not present

## 2020-04-14 DIAGNOSIS — H8109 Meniere's disease, unspecified ear: Secondary | ICD-10-CM

## 2020-04-14 NOTE — Telephone Encounter (Signed)
I have made the 2nd attempt to contact the patient or family member in charge, in order to follow up from recently being discharged from the hospital. I left a message on voicemail but I will make another attempt at a different time.  

## 2020-04-14 NOTE — Telephone Encounter (Signed)
Transition Care Management Follow-Up Telephone Call   Date discharged and where:04/12/2020 [place]  How have you been since you were released from the hospital? Getting better, just alittle weak.   Any patient concerns? No, Dealing with the heating system at home and waiting for Home Health to come tomorrow.   Items Reviewed:   Meds: Yes  Allergies:Yes  Dietary Changes Reviewed:Yes  Functional Questionnaire:  Independent-I Dependent-D  Hodgkins- I    Eating-I   Maintaining continence-I   Transferring-I with assistance Rollator   Transportation-D Husband   Meal Prep-I   Managing Meds- I  Confirmed importance and Date/Time of follow-up visits scheduled:Tried to offer appointment next week, Patient stated that she is going to call back to schedule an appointment. Stated that she has [facility] coming tomorrow and Maintenance fixing heating system.     Confirmed with patient if condition worsens to call PCP or go to the Emergency Dept. Patient was given office number and encouraged to call back with questions or concerns: Yes

## 2020-04-15 ENCOUNTER — Telehealth: Payer: Self-pay

## 2020-04-15 NOTE — Telephone Encounter (Signed)
Spoke with patients husband and schedule appointment for 04/21/20 at 2:15 pm, patient asked to arrive at 2:00 pm for check-in

## 2020-04-15 NOTE — Telephone Encounter (Signed)
Incoming call from Shanon Brow with [facility] requesting verbal orders for PT  2 time a week for 4 weeks 1 time a week for 2 weeks  To help with strength, balance, endurance, safety, and fall prevention.  Per [employer] standing order, verbal order given. Message will be sent to patient's provider as a FYI.

## 2020-04-16 ENCOUNTER — Emergency Department (HOSPITAL_COMMUNITY)
Admission: EM | Admit: 2020-04-16 | Discharge: 2020-04-16 | Disposition: A | Discharge status: Home / Self Care | Payer: Medicare Other | Attending: Emergency Medicine | Admitting: Emergency Medicine

## 2020-04-16 ENCOUNTER — Emergency Department (HOSPITAL_COMMUNITY): Payer: Medicare Other

## 2020-04-16 ENCOUNTER — Other Ambulatory Visit: Payer: Self-pay

## 2020-04-16 DIAGNOSIS — R0602 Shortness of breath: Secondary | ICD-10-CM | POA: Diagnosis not present

## 2020-04-16 DIAGNOSIS — J449 Chronic obstructive pulmonary disease, unspecified: Secondary | ICD-10-CM | POA: Diagnosis not present

## 2020-04-16 DIAGNOSIS — Z7901 Long term (current) use of anticoagulants: Secondary | ICD-10-CM | POA: Diagnosis not present

## 2020-04-16 DIAGNOSIS — R21 Rash and other nonspecific skin eruption: Secondary | ICD-10-CM | POA: Diagnosis not present

## 2020-04-16 DIAGNOSIS — I4891 Unspecified atrial fibrillation: Secondary | ICD-10-CM | POA: Insufficient documentation

## 2020-04-16 DIAGNOSIS — Z85828 Personal history of other malignant neoplasm of skin: Secondary | ICD-10-CM | POA: Insufficient documentation

## 2020-04-16 DIAGNOSIS — I499 Cardiac arrhythmia, unspecified: Secondary | ICD-10-CM | POA: Diagnosis not present

## 2020-04-16 DIAGNOSIS — R002 Palpitations: Secondary | ICD-10-CM | POA: Diagnosis not present

## 2020-04-16 LAB — CBC WITH DIFFERENTIAL/PLATELET
Abs Immature Granulocytes: 0.01 10*3/uL (ref 0.00–0.07)
Basophils Absolute: 0.1 10*3/uL (ref 0.0–0.1)
Basophils Relative: 1 %
Eosinophils Absolute: 0.1 10*3/uL (ref 0.0–0.5)
Eosinophils Relative: 2 %
HCT: 45.5 % (ref 36.0–46.0)
Hemoglobin: 14.6 g/dL (ref 12.0–15.0)
Immature Granulocytes: 0 %
Lymphocytes Relative: 10 %
Lymphs Abs: 0.7 10*3/uL (ref 0.7–4.0)
MCH: 29.6 pg (ref 26.0–34.0)
MCHC: 32.1 g/dL (ref 30.0–36.0)
MCV: 92.1 fL (ref 80.0–100.0)
Monocytes Absolute: 0.6 10*3/uL (ref 0.1–1.0)
Monocytes Relative: 8 %
Neutro Abs: 5.6 10*3/uL (ref 1.7–7.7)
Neutrophils Relative %: 79 %
Platelets: 295 10*3/uL (ref 150–400)
RBC: 4.94 MIL/uL (ref 3.87–5.11)
RDW: 13.3 % (ref 11.5–15.5)
WBC: 7.1 10*3/uL (ref 4.0–10.5)
nRBC: 0 % (ref 0.0–0.2)

## 2020-04-16 LAB — BASIC METABOLIC PANEL
Anion gap: 11 (ref 5–15)
BUN: 14 mg/dL (ref 8–23)
CO2: 24 mmol/L (ref 22–32)
Calcium: 9.1 mg/dL (ref 8.9–10.3)
Chloride: 99 mmol/L (ref 98–111)
Creatinine, Ser: 0.62 mg/dL (ref 0.44–1.00)
GFR, Estimated: >60 mL/min (ref >60)
Glucose, Bld: 138 mg/dL — ABNORMAL HIGH (ref 70–99)
Potassium: 4 mmol/L (ref 3.5–5.1)
Sodium: 134 mmol/L — ABNORMAL LOW (ref 135–145)

## 2020-04-16 LAB — MAGNESIUM: Magnesium: 2.4 mg/dL (ref 1.7–2.4)

## 2020-04-16 LAB — BRAIN NATRIURETIC PEPTIDE: B Natriuretic Peptide: 240.2 pg/mL — ABNORMAL HIGH (ref 0.0–100.0)

## 2020-04-16 MED ORDER — METOPROLOL SUCCINATE ER 50 MG PO TB24: 50.0000 mg | ORAL_TABLET | Freq: Every day | ORAL | 0 refills | Status: DC

## 2020-04-16 MED ORDER — RIVAROXABAN 15 MG PO TABS
15.0000 mg | ORAL_TABLET | Freq: Once | ORAL | Status: AC
  Administered 2020-04-16: 15 mg via ORAL
  Filled 2020-04-16: qty 1

## 2020-04-16 MED ORDER — METOPROLOL SUCCINATE ER 25 MG PO TB24
50.0000 mg | ORAL_TABLET | Freq: Every day | ORAL | Status: DC
  Administered 2020-04-16: 50 mg via ORAL
  Filled 2020-04-16: qty 2

## 2020-04-16 MED ORDER — RIVAROXABAN 20 MG PO TABS: 20.0000 mg | ORAL_TABLET | Freq: Every day | ORAL | 0 refills | Status: DC

## 2020-04-16 NOTE — Discharge Instructions (Signed)
Atrial Fibrillation Clinic:  You should be called monday or tuesday for an appointment.  If you do not hear from them, call on Tuesday for an appointment.  STOP taking diltiazem and eliquis. START taking metoprolol 50 mg once daily and xarelto 20 mg once daily.

## 2020-04-16 NOTE — ED Triage Notes (Signed)
Pt called EMS for eval of palpitations and shob starting at 0530 this morning. Recently dx with afib and d/c with cardizem and blood thinner. Stopped taking both yesterday. In afib rvr, given 10mg  cardizem and 250 NS bolus with improvement in rate.

## 2020-04-16 NOTE — ED Provider Notes (Signed)
[place] EMERGENCY DEPARTMENT Provider Note   CSN: [id number] Arrival date & time: 04/16/20  1116     History Chief Complaint  Patient presents with  . Atrial Fibrillation    Alicia [name] is a 75 y.o. female presenting emergency department with shortness of breath and palpitations.  The patient just left the hospital 4 days ago on February 7 after admission for new onset of A. fib.  She was started on Eliquis and diltiazem at that time.  Spontaneously converted into sinus rhythm while in the hospital.  Her heart rate was well controlled.  She was continued on 120 mg of Cardizem.  She reports she has been compliant with her medication including her Cardizem and Eliquis.  Her last dose of Eliquis was yesterday evening.  She was concerned that she has developed an itchy red rash particularly on her back, thinks it may be a reaction to the medications, and states that Eliquis and Cardizem are only new medications.  She did not take Eliquis this morning.  She has taken Cardizem when she woke up at 5 AM with his palpitations.  She called EMS and they also gave her IV Cardizem.  She reports the palpitations are improved since her arrival in the ED.  Cardiology Dr Meda Coffee, Ermalinda Barrios PA  She is allergic to benadryl and it causes tachycardia  On last hospitalization she had a CT coronary scan which showed:  IMPRESSION: 1. Coronary calcium score of 0. This was 0 percentile for age and sex matched control.  2. Normal coronary origin with right dominance.  3. CAD-RADS 0. No evidence of CAD (0%). Consider non-atherosclerotic causes of chest pain.  4. 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  She also had a CT PE negative for embolus.  HPI     Past Medical History:  Diagnosis Date  . Bronchitis   . 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 Chronic migraine without aura, intractable, without status migrainosus   . Diverticulosis 2010  . Endometriosis   .  Low sodium levels   . Malnutrition ([place])   . MD (muscular dystrophy) ([place])   . Meniere's disease, bilateral   . [facility]    [facility]  . Peripheral neuropathy    Dr Erling Cruz  . Pneumonia   . Sensorineural hearing loss, bilateral   . Vertigo 2019    Patient Active Problem List   Diagnosis Date Noted  . Atrial fibrillation with RVR ([place]) 04/06/2020  . Atypical chest pain 04/06/2020  . Acute bronchitis 12/21/2015  . Chest pain 10/19/2014  . Carotid stenosis 10/19/2014  . Diverticulosis of colon without hemorrhage 08/20/2012  . Heart palpitations 07/04/2012  . Chronic hyponatremia 07/04/2012  . Lumbar radiculopathy 06/03/2012  . 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 Muscular dystrophy ([place]) 11/23/2011  . PARESTHESIA 11/11/2009  . PERIPHERAL NEUROPATHY 01/29/2008  . Disorder of bone and cartilage 01/27/2008  . SKIN CANCER, HX OF 01/27/2008  . GOITER, NONTOXIC MULTINODULAR 10/23/2006  . HYPERLIPIDEMIA NEC/NOS 10/23/2006  . FIBROMYALGIA 10/23/2006    Past Surgical History:  Procedure Laterality Date  . ABDOMINAL HYSTERECTOMY  1980 or 1981   for Endometriosis  . 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 BREAST ENHANCEMENT SURGERY Bilateral 1979  . COLONOSCOPY  01/2012   Tics; Dr Teena Irani  . ESI      X 3 in 2003 & 02/17/2011 for L4-S1 symptoms  . LUMBAR LAMINECTOMY  12/2012  [place] , [place]  . TONSILLECTOMY  1950 or 1951  . TUBAL LIGATION    . vocal cords stripped  1970     OB History   No obstetric history on file.     Family History  Problem Relation Age of Onset  . Alcohol abuse Mother   . Hypertension Mother   . Diabetes Mother 65  . Stroke Mother 3  . Dementia Mother   . Kidney failure Mother   . Osteoporosis Mother   . Alzheimer's disease Father 49  . Cancer Maternal Aunt 80       pancreatic  . Arthritis Sister 30       knee replacement  . Hyperlipidemia Sister   . Lung disease Maternal Grandmother   . Lung disease  Maternal Grandfather   . Hyperlipidemia Sister 77  . Other Son        recent back injury  . Other Daughter        recent back injury; knee issues when playing tennis  . Heart disease Neg Hx   . Heart attack Neg Hx     Social History   Tobacco Use  . Smoking status: Never Smoker  . Smokeless tobacco: Never Used  Vaping Use  . Vaping Use: Never used  Substance Use Topics  . Alcohol use: Not Currently  . Drug use: No    Home Medications Prior to Admission medications   Medication Sig Start Date End Date Taking? Authorizing Provider  metoprolol succinate (TOPROL XL) 50 MG 24 hr tablet Take 1 tablet (50 mg total) by mouth daily for 30 doses. Take with or immediately following a meal. 04/17/20 05/17/20 Yes Glema Takaki, Carola Rhine, MD  rivaroxaban (XARELTO) 20 MG TABS tablet Take 1 tablet (20 mg total) by mouth daily with supper. 04/17/20  Yes Wyvonnia Dusky, MD  AZELAIC ACID EX Apply 1 application topically 2 (two) times daily. Right after metronidazole cream    [provider]  baclofen (LIORESAL) 10 MG tablet Take 10 mg by mouth at bedtime.    [provider]  Biotin 5000 MCG TABS Take 5,000 mcg by mouth daily.     [provider]  butalbital-acetaminophen-caffeine (FIORICET, ESGIC) 50-325-40 MG tablet Take 1 tablet by mouth every 6 (six) hours as needed for migraine (for severe pain).    [provider]  CALCIUM PO Take 1,200 mg by mouth 2 (two) times daily.    [provider]  Carboxymethylcellulose Sodium 0.25 % SOLN Apply 1 drop to eye 3 (three) times daily.    [provider]  Cholecalciferol (VITAMIN D3) LIQD Take 1 drop by mouth See admin instructions. Takes 1 drop by mouth every day except Sat and Sun    [provider]  Evening Primrose Oil 1000 MG CAPS Take 1,000 mg by mouth daily at 12 noon.    [provider]  [place] 0.05 MG/24HR RING Place 1 each vaginally every 3 (three) months. 02/10/19   [provider]  gabapentin (NEURONTIN) 100 MG capsule Take 200 mg by mouth at bedtime.     [provider]  ipratropium (ATROVENT) 0.06 % nasal spray Place 2 sprays into both nostrils 2 (two) times daily as needed for rhinitis.    [provider]  lactase (LACTAID) 3000 units tablet Take 3,000 Units by mouth daily as needed (when eating dairy products).    [provider]  Lutein 20 MG TABS Take 20 mg by mouth daily.  [provider]  MAGNESIUM CITRATE PO Take 300 mg by mouth at bedtime.    [provider]  meclizine (ANTIVERT) 12.5 MG tablet Take 1 tablet (12.5 mg total) by mouth 3 (three) times daily as needed for dizziness. 09/14/19   Maudie Flakes, MD  methocarbamol (ROBAXIN) 750 MG tablet Take 750 mg by mouth every 6 (six) hours.    [provider]  metroNIDAZOLE (METROGEL) 0.75 % gel Apply 1 application topically 2 (two) times daily. Apply to face for rosacea    [provider]  Multiple Vitamin (MULTIVITAMIN WITH MINERALS) TABS tablet Take 1 tablet by mouth daily.    [provider]  Multiple Vitamins-Minerals (PRESERVISION AREDS 2) CAPS Take 1 capsule by mouth 2 (two) times daily.    [provider]  Omega-3 1000 MG CAPS Take 1,000 mg by mouth 2 (two) times daily.    [provider]  ondansetron (ZOFRAN-ODT) 4 MG disintegrating tablet Take 1 tablet (4 mg total) by mouth every 6 (six) hours as needed for nausea or vomiting. 04/12/20   Rai, Vernelle Emerald, MD  sodium chloride 1 g tablet Take 1 tablet (1 g total) by mouth 2 (two) times daily with a meal. 04/12/20   Rai, Ripudeep K, MD  SUMAtriptan (IMITREX) 50 MG tablet Take 1 tablet (50 mg total) by mouth every 2 (two) hours as needed for migraine. May repeat in 2 hours if headache persists or recurs. 04/12/20   Rai, Vernelle Emerald, MD  apixaban (ELIQUIS) 5 MG TABS tablet Take 1 tablet (5 mg total) by mouth 2 (two) times daily. 04/12/20 04/16/20  Rai, Vernelle Emerald, MD   diltiazem (CARDIZEM CD) 120 MG 24 hr capsule Take 1 capsule (120 mg total) by mouth daily. 04/12/20 04/16/20  Mendel Corning, MD    Allergies    Biaxin [clarithromycin], Sulfonamide derivatives, Tequin [gatifloxacin], Demerol, Lactose intolerance (gi), Nsaids, Risedronate sodium, Rofecoxib, Alendronate sodium, Aspirin, Fish allergy, and Benadryl [diphenhydramine hcl]  Review of Systems   Review of Systems  Constitutional: Negative for chills and fever.  Eyes: Negative for pain and visual disturbance.  Respiratory: Positive for shortness of breath. Negative for cough.   Cardiovascular: Positive for palpitations. Negative for chest pain.  Gastrointestinal: Negative for abdominal pain and vomiting.  Genitourinary: Negative for dysuria and hematuria.  Musculoskeletal: Negative for arthralgias and back pain.  Skin: Positive for rash. Negative for color change.  Neurological: Positive for light-headedness. Negative for syncope.  All other systems reviewed and are negative.   Physical Exam Updated Vital Signs BP 115/69   Pulse 71   Temp 98.2 F (36.8 C) (Oral)   Resp 20   SpO2 99%   Physical Exam Constitutional:      General: She is not in acute distress.    Comments: Thin, frail  HENT:     Head: Normocephalic and atraumatic.  Eyes:     Conjunctiva/sclera: Conjunctivae normal.     Pupils: Pupils are equal, round, and reactive to light.  Cardiovascular:     Rate and Rhythm: Tachycardia present. Rhythm irregular.     Comments: HR 100-110 bpm Pulmonary:     Effort: Pulmonary effort is normal. No respiratory distress.  Abdominal:     General: There is no distension.     Tenderness: There is no abdominal tenderness.  Skin:    General: Skin is warm and dry.     Comments: Erythematous pruritic rash on back and chest around electrode lead placement  Neurological:  General: No focal deficit present.     Mental Status: She is alert. Mental status is at baseline.  Psychiatric:         Mood and Affect: Mood normal.        Behavior: Behavior normal.     ED Results / Procedures / Treatments   Labs (all labs ordered are listed, but only abnormal results are displayed) Labs Reviewed  BASIC METABOLIC PANEL - Abnormal; Notable for the following components:      Result Value   Sodium 134 (*)    Glucose, Bld 138 (*)    All other components within normal limits  BRAIN NATRIURETIC PEPTIDE - Abnormal; Notable for the following components:   B Natriuretic Peptide 240.2 (*)    All other components within normal limits  MAGNESIUM  CBC WITH DIFFERENTIAL/PLATELET    EKG EKG Interpretation  Date/Time:  Friday April 16 2020 11:24:33 EST Ventricular Rate:  107 PR Interval:    QRS Duration: 95 QT Interval:  352 QTC Calculation: 463 R Axis:   69 Text Interpretation: Atrial fibrillation RSR' in V1 or V2, probably normal variant Probable left ventricular hypertrophy Minimal ST elevation, inferior leads No STEMI Confirmed by Octaviano Glow [phone]) on 04/16/2020 11:33:30 AM   Radiology DG Chest Portable 1 View  Result Date: 04/16/2020 CLINICAL DATA:  Shortness of breath and atrial fibrillation. EXAM: PORTABLE CHEST 1 VIEW COMPARISON:  CT a chest dated April 08, 2020. Chest x-ray dated April 05, 2020. FINDINGS: The heart size and mediastinal contours are within normal limits. Normal pulmonary vascularity. The lungs remain hyperinflated with biapical pleuroparenchymal scarring. No focal consolidation, pleural effusion, or pneumothorax. No acute osseous abnormality. IMPRESSION: 1. COPD. No active disease. Electronically Signed   By: Titus Dubin M.D.   On: 04/16/2020 12:14    Procedures Procedures   Medications Ordered in ED Medications  metoprolol succinate (TOPROL-XL) 24 hr tablet 50 mg (50 mg Oral Given 04/16/20 1344)  Rivaroxaban (XARELTO) tablet 15 mg (15 mg Oral Given 04/16/20 1434)    ED Course  I have reviewed the triage vital signs and the nursing  notes.  Pertinent labs & imaging results that were available during my care of the patient were reviewed by me and considered in my medical decision making (see chart for details).  A Fib with RVR, HR 110 bpm after diltiazem from EMS Pt symptomatic with palpitations and lightheadedness.  BP is stable.  Compliant with eliquis and other medications since recent discharge  Prior medical records reviewed - echo performed on recent hospitalization with no thrombus.  CT coronaries with low score - doubt ACS at this time  Labs reviewed today.  Electrolytes wnl.  Mg 2.4, K 4.0.  Na 134.  CBC unremarkable - no sign of anemia.  Low overall concern for acute infection.  DG chest reviewed, no infectious process.  BNP mildly elevated, likely from rapid heart rate.  Doubt significant CHF exacerbation at this time.  She does not need diuresis and is sensitive with blood pressures to medications - I would not diuresis at this time.    Cardiology consulted PO metoprolol and xarelto given in ED  Clinical Course as of 04/16/20 1813  Fri Apr 16, 2020  1313 I spoke to Dr Marlou Porch who advised continued rate control, can switch to Toprol XL 50 mg daily, and also switch eliquis to xarelto 20 mg once daily - given uncertainty about which medication may be causing the patient's drug rash.  We do not need  to cardiovert here in the ED.  If she is comfortable she can be discharged with A Fib clinic f/u. [MT]  9688 Pt converted back to NSR with HR 70 approx 30 minutes after taking metoprolol.  Feels much better, back to baseline.  Will give xarelto here, prescribe home meds, f/u in A Fib clinic. [MT]    Clinical Course User Index [MT] Wyvonnia Dusky, MD   Final Clinical Impression(s) / ED Diagnoses Final diagnoses:  Atrial fibrillation with RVR ([place])    Rx / DC Orders ED Discharge Orders         Ordered    metoprolol succinate (TOPROL XL) 50 MG 24 hr tablet  Daily        04/16/20 1439    rivaroxaban (XARELTO)  20 MG TABS tablet  Daily with supper        04/16/20 1439    Amb Referral to AFIB Clinic        04/16/20 1440           Wyvonnia Dusky, MD 04/16/20 [phone]

## 2020-04-16 NOTE — ED Notes (Signed)
Got patient on the monitor did ekg shown to er doctor patient is resting with call bell in reach

## 2020-04-16 NOTE — ED Notes (Signed)
Patient discharge instructions reviewed with the patient. The patient verbalized understanding of instructions. Patient discharged. 

## 2020-04-16 NOTE — ED Notes (Signed)
Put patient on the bedpan patient did well now patient is off the pan with call bell in reach

## 2020-04-20 DIAGNOSIS — G43009 Migraine without aura, not intractable, without status migrainosus: Secondary | ICD-10-CM | POA: Diagnosis not present

## 2020-04-20 DIAGNOSIS — G7100 Muscular dystrophy, unspecified: Secondary | ICD-10-CM | POA: Diagnosis not present

## 2020-04-20 DIAGNOSIS — E871 Hypo-osmolality and hyponatremia: Secondary | ICD-10-CM | POA: Diagnosis not present

## 2020-04-20 DIAGNOSIS — I951 Orthostatic hypotension: Secondary | ICD-10-CM | POA: Diagnosis not present

## 2020-04-20 DIAGNOSIS — H8109 Meniere's disease, unspecified ear: Secondary | ICD-10-CM | POA: Diagnosis not present

## 2020-04-20 DIAGNOSIS — I4891 Unspecified atrial fibrillation: Secondary | ICD-10-CM | POA: Diagnosis not present

## 2020-04-21 ENCOUNTER — Other Ambulatory Visit: Payer: Self-pay

## 2020-04-21 ENCOUNTER — Encounter: Payer: Self-pay | Admitting: Nurse Practitioner

## 2020-04-21 ENCOUNTER — Telehealth: Payer: Self-pay | Admitting: *Deleted

## 2020-04-21 ENCOUNTER — Ambulatory Visit (INDEPENDENT_AMBULATORY_CARE_PROVIDER_SITE_OTHER): Payer: Medicare Other | Admitting: Nurse Practitioner

## 2020-04-21 VITALS — BP 126/80 | HR 64 | Temp 96.9°F | Ht 66.0 in | Wt 107.0 lb

## 2020-04-21 DIAGNOSIS — E871 Hypo-osmolality and hyponatremia: Secondary | ICD-10-CM

## 2020-04-21 DIAGNOSIS — L231 Allergic contact dermatitis due to adhesives: Secondary | ICD-10-CM

## 2020-04-21 DIAGNOSIS — R42 Dizziness and giddiness: Secondary | ICD-10-CM | POA: Diagnosis not present

## 2020-04-21 DIAGNOSIS — Z1159 Encounter for screening for other viral diseases: Secondary | ICD-10-CM

## 2020-04-21 DIAGNOSIS — I4891 Unspecified atrial fibrillation: Secondary | ICD-10-CM | POA: Diagnosis not present

## 2020-04-21 NOTE — Progress Notes (Signed)
Careteam: Patient Care Team: Lauree Chandler, NP as PCP - General (Geriatric Medicine) Deliah Goody, PA-C as Consulting Physician (Physician Assistant) Danella Sensing, MD as Consulting Physician (Dermatology) Everett Graff, MD as Consulting Physician (Obstetrics and Gynecology) Vicie Mutters, MD as Consulting Physician (Otolaryngology) Marshell Garfinkel, MD as Consulting Physician (Pulmonary Disease) Gayla Medicus, MD as Referring Physician (Neurology)  PLACE OF SERVICE:  [place] Directive information Does Patient Have a Medical Advance Directive?: Yes, Type of Advance Directive: [place];Living will, Does patient want to make changes to medical advance directive?: No - Patient declined  Allergies  Allergen Reactions  . Biaxin [Clarithromycin] Anaphylaxis  . Sulfonamide Derivatives Itching and Rash  . Tequin [Gatifloxacin] Anaphylaxis  . Demerol Nausea And Vomiting  . Lactose Intolerance (Gi) Other (See Comments)    Abdominal pain, gas, bloating  . Nsaids Other (See Comments)    Reaction:  GI upset/heartburn   . Risedronate Sodium Nausea And Vomiting and Other (See Comments)     GI upset/heartburn   . Rofecoxib Nausea And Vomiting and Other (See Comments)    GI upset/heartburn   . Alendronate Sodium Nausea And Vomiting  . Aspirin Other (See Comments)    Abdominal pain   . Fish Allergy Nausea And Vomiting    Can take fish oil  . Benadryl [Diphenhydramine Hcl] Palpitations    Chief Complaint  Patient presents with  . [facility] follow-up from 04/05/2020- 04/12/2020 visit. Patient states medications make her cry easily and causing decreased appetite. Examine rash on back and allergic reaction from EKG leads (using hydrocortisone cream) Discuss need for Hep C screening, covid, flu vaccine, and colonoscopy or exclude     HPI: Patient is a 75 y.o. female for TOC.  Using roger hearing assistance and it has been helping  her. Does not want to get a cochlear implant. This has been helping her.    She has a history of muscular dystrophy, peripheral neuropathy, chronic hyponatremia who presented to the ED on 04/05/20 with 1 week of shortness of breath with palpitations, nonproductive cough, and intermittent left-sided chest pressure. She was found to have new onset A. fib with RVR. Her hospitalization was complicated by continued chest discomfort as well as orthostatic hypotension. she was discharged home on cardizem and eliquis  On 04/12/20 however continue to have rapid heart rate.  She went back to ED on 04/16/20 due to ongoing a fib with RVR. Reports cardizem was not holding her HR down. She also had an itchy rash on her back, scalp, chest. She was taken off cardizem and eliquis and placed on metoprolol and xarelto due to allergy.   Also had a reaction to the EKG leads while hospitalized. Red raised patches that are fading now. Still very itchy to her back, this is improving since she has been off medication. Using hydrocortisone cream which is helping itch and keeping her comfortable.   Currently metoprolol is keeping her rate controlled. No chest pains or palpitations. She has referral to a fib clinic  Has PT coming twice weekly and doing well with that.  Helping her with core strength and helping her with deep breathing. Leg exercises. Best PT she has ever had.  Reports he is working with her muscles and trigger points that helps her relax and so she is not in pain after the session.   Having trouble deep breathing but worked with PT which helped. Also help dizziness.  hyponatremia was started on salt tablet during hospitalization. Sodium trending up. She salts food as well. Reports she has had low sodium for about 7 years but unsure why it is happening. Has seen the nephrologist but still unsure of why.   Having more issues with frequent urination, drinking more fluids.   Review of Systems:  Review of  Systems  Constitutional: Negative for chills, fever and weight loss.  HENT: Negative for tinnitus.   Respiratory: Negative for cough, sputum production and shortness of breath.   Cardiovascular: Positive for palpitations (improved). Negative for chest pain and leg swelling.  Gastrointestinal: Negative for abdominal pain, constipation, diarrhea and heartburn.  Genitourinary: Positive for frequency. Negative for dysuria and urgency.  Musculoskeletal: Negative for back pain, falls, joint pain and myalgias.  Skin: Positive for itching and rash.  Neurological: Positive for dizziness. Negative for headaches.  Psychiatric/Behavioral: Negative for depression and memory loss. The patient does not have insomnia.     Past Medical History:  Diagnosis Date  . Bronchitis   . 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 Chronic migraine without aura, intractable, without status migrainosus   . Diverticulosis 2010  . Endometriosis   . Low sodium levels   . Malnutrition ([place])   . MD (muscular dystrophy) ([place])   . Meniere's disease, bilateral   . [facility]    [facility]  . Peripheral neuropathy    Dr Erling Cruz  . Pneumonia   . Sensorineural hearing loss, bilateral   . Vertigo 2019   Past Surgical History:  Procedure Laterality Date  . ABDOMINAL HYSTERECTOMY  1980 or 1981   for Endometriosis  . 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 BREAST ENHANCEMENT SURGERY Bilateral 1979  . COLONOSCOPY  01/2012   Tics; Dr Teena Irani  . ESI      X 3 in 2003 & 02/17/2011 for L4-S1 symptoms  . LUMBAR LAMINECTOMY  12/2012   W-S , [place]  . TONSILLECTOMY  1950 or 1951  . TUBAL LIGATION    . vocal cords stripped  1970   Social History:   reports that she has never smoked. She has never used smokeless tobacco. She reports previous alcohol use. She reports that she does not use drugs.  Family History  Problem Relation Age of Onset  . Alcohol abuse Mother   . Hypertension Mother   .  Diabetes Mother 67  . Stroke Mother 37  . Dementia Mother   . Kidney failure Mother   . Osteoporosis Mother   . Alzheimer's disease Father 17  . Cancer Maternal Aunt 80       pancreatic  . Arthritis Sister 30       knee replacement  . Hyperlipidemia Sister   . Lung disease Maternal Grandmother   . Lung disease Maternal Grandfather   . Hyperlipidemia Sister 82  . Other Son        recent back injury  . Other Daughter        recent back injury; knee issues when playing tennis  . Heart disease Neg Hx   . Heart attack Neg Hx     Medications: Patient's Medications  New Prescriptions   No medications on file  Previous Medications   AZELAIC ACID EX    Apply 1 application topically 2 (two) times daily. Right after metronidazole cream   BACLOFEN (LIORESAL) 10 MG TABLET    Take 10 mg by mouth at bedtime.  BIOTIN 5000 MCG TABS    Take 5,000 mcg by mouth daily.    BUTALBITAL-ACETAMINOPHEN-CAFFEINE (FIORICET, ESGIC) 50-325-40 MG TABLET    Take 1 tablet by mouth every 6 (six) hours as needed for migraine (for severe pain).   CALCIUM PO    Take 1,200 mg by mouth 2 (two) times daily.   CARBOXYMETHYLCELLULOSE SODIUM 0.25 % SOLN    Apply 1 drop to eye 3 (three) times daily.   CHOLECALCIFEROL (VITAMIN D3) LIQD    Take 1 drop by mouth See admin instructions. Takes 1 drop by mouth every day except Sat and Sun   EVENING PRIMROSE OIL 1000 MG CAPS    Take 1,000 mg by mouth daily at 12 noon.   FEMRING 0.05 MG/24HR RING    Place 1 each vaginally every 3 (three) months.   GABAPENTIN (NEURONTIN) 100 MG CAPSULE    Take 200 mg by mouth at bedtime.    LACTASE (LACTAID) 3000 UNITS TABLET    Take 3,000 Units by mouth daily as needed (when eating dairy products).   LUTEIN 20 MG TABS    Take 20 mg by mouth daily.    MAGNESIUM CITRATE PO    Take 300 mg by mouth at bedtime.   MECLIZINE (ANTIVERT) 12.5 MG TABLET    Take 1 tablet (12.5 mg total) by mouth 3 (three) times daily as needed for dizziness.    METHOCARBAMOL (ROBAXIN) 750 MG TABLET    Take 750 mg by mouth every 6 (six) hours.   METOPROLOL SUCCINATE (TOPROL XL) 50 MG 24 HR TABLET    Take 1 tablet (50 mg total) by mouth daily for 30 doses. Take with or immediately following a meal.   METRONIDAZOLE (METROGEL) 0.75 % GEL    Apply 1 application topically 2 (two) times daily. Apply to face for rosacea   MULTIPLE VITAMIN (MULTIVITAMIN WITH MINERALS) TABS TABLET    Take 1 tablet by mouth daily.   MULTIPLE VITAMINS-MINERALS (PRESERVISION AREDS 2) CAPS    Take 1 capsule by mouth 2 (two) times daily.   OMEGA-3 1000 MG CAPS    Take 1,000 mg by mouth 2 (two) times daily.   ONDANSETRON (ZOFRAN-ODT) 4 MG DISINTEGRATING TABLET    Take 1 tablet (4 mg total) by mouth every 6 (six) hours as needed for nausea or vomiting.   RIVAROXABAN (XARELTO) 20 MG TABS TABLET    Take 1 tablet (20 mg total) by mouth daily with supper.   SODIUM CHLORIDE 1 G TABLET    Take 1 tablet (1 g total) by mouth 2 (two) times daily with a meal.   SUMATRIPTAN (IMITREX) 50 MG TABLET    Take 1 tablet (50 mg total) by mouth every 2 (two) hours as needed for migraine. May repeat in 2 hours if headache persists or recurs.  Modified Medications   No medications on file  Discontinued Medications   IPRATROPIUM (ATROVENT) 0.06 % NASAL SPRAY    Place 2 sprays into both nostrils 2 (two) times daily as needed for rhinitis.    Physical Exam:  Vitals:   04/21/20 1409  BP: 126/80  Pulse: 64  Temp: (!) 96.9 F (36.1 C)  TempSrc: Temporal  SpO2: 99%  Weight: 107 lb (48.5 kg)  Height: 5\' 6"  (1.676 m)   Body mass index is 17.27 kg/m. Wt Readings from Last 3 Encounters:  04/21/20 107 lb (48.5 kg)  04/11/20 103 lb 11.2 oz (47 kg)  04/01/20 106 lb 12.8 oz (48.4 kg)  Physical Exam Constitutional:      General: She is not in acute distress.    Appearance: She is well-developed and well-nourished. She is not diaphoretic.  HENT:     Head: Normocephalic and atraumatic.      Mouth/Throat:     Mouth: Oropharynx is clear and moist.     Pharynx: No oropharyngeal exudate.  Eyes:     Conjunctiva/sclera: Conjunctivae normal.     Pupils: Pupils are equal, round, and reactive to light.  Cardiovascular:     Rate and Rhythm: Normal rate and regular rhythm.     Heart sounds: Normal heart sounds.  Pulmonary:     Effort: Pulmonary effort is normal.     Breath sounds: Normal breath sounds.  Abdominal:     General: Bowel sounds are normal.     Palpations: Abdomen is soft.  Musculoskeletal:        General: No tenderness or edema.     Cervical back: Normal range of motion and neck supple.  Skin:    General: Skin is warm and dry.     Findings: Rash present. Rash is macular (to back).     Comments: Round red patches to anterior chest    Neurological:     Mental Status: She is alert and oriented to person, place, and time.  Psychiatric:        Mood and Affect: Mood and affect normal.     Labs reviewed: Basic Metabolic Panel: Recent Labs    04/01/20 1352 04/05/20 1726 04/06/20 0703 04/08/20 0707 04/11/20 0258 04/12/20 0126 04/16/20 1125  NA 131*   < > 134*   < > 127* 133* 134*  K 4.0   < > 4.6   < > 4.3 4.5 4.0  CL 92*   < > 101   < > 96* 97* 99  CO2 29   < > 24   < > 21* 27 24  GLUCOSE 122   < > 98   < > 160* 87 138*  BUN 17   < > 8   < > 11 14 14   CREATININE 0.59*   < > 0.48   < > 0.51 0.77 0.62  CALCIUM 9.8   < > 9.0   < > 9.0 9.3 9.1  MG 2.2  --  2.2  --   --   --  2.4  TSH 3.76  --   --   --   --   --   --    < > = values in this interval not displayed.   Liver Function Tests: Recent Labs    04/01/20 1352  AST 21  ALT 15  BILITOT 0.5  PROT 7.2   No results for input(s): LIPASE, AMYLASE in the last 8760 hours. No results for input(s): AMMONIA in the last 8760 hours. CBC: Recent Labs    04/01/20 1352 04/05/20 1726 04/06/20 0703 04/16/20 1125  WBC 7.7 6.2 6.0 7.1  NEUTROABS 6,483  --   --  5.6  HGB 14.6 14.4 13.8 14.6  HCT 43.8 41.8  39.7 45.5  MCV 91.4 90.3 89.8 92.1  PLT 262 257 228 295   Lipid Panel: No results for input(s): CHOL, HDL, LDLCALC, TRIG, CHOLHDL, LDLDIRECT in the last 8760 hours. TSH: Recent Labs    04/01/20 1352  TSH 3.76   A1C: Lab Results  Component Value Date   HGBA1C 5.6 03/03/2011     Assessment/Plan 1. Chronic hyponatremia -reports workup with previous PCP and  nephrologist however I do no see that we ever got these records. Will need pt to sign record release to obtain previous studies for review.  - BASIC METABOLIC PANEL WITH GFR  2. New onset atrial fibrillation ([place]) -did not tolerate eliquis or cardizem with diffuse rash, this is improving with metoprolol and xarelto. Rate controlled at this time. Has follow up with cardiology scheduled.  Bp and HR stable at this time. No signs of bruising or bleeding. CBC stable In ED 4 days ago.   3. Dizziness -hx of menieres disease and chronic migraines, working with PT to help symptom management.   4. Need for hepatitis C screening test - Hepatitis C antibody  5. Allergic contact dermatitis due to adhesives Improving since discharge. Continues to put OTC topicals    Next appt: 4 month follow up, sooner if needed  Raphaella Larkin K. [place], [facility] [phone]

## 2020-04-21 NOTE — Telephone Encounter (Signed)
Alicia [name] with [name] called and stated that he just wanted to make you aware that there is an interaction alert with patient's Fioricet and Xarelto.   Patient has an appointment scheduled for today for a TOC at 2:15

## 2020-04-22 DIAGNOSIS — I951 Orthostatic hypotension: Secondary | ICD-10-CM | POA: Diagnosis not present

## 2020-04-22 DIAGNOSIS — E871 Hypo-osmolality and hyponatremia: Secondary | ICD-10-CM | POA: Diagnosis not present

## 2020-04-22 DIAGNOSIS — G43009 Migraine without aura, not intractable, without status migrainosus: Secondary | ICD-10-CM | POA: Diagnosis not present

## 2020-04-22 DIAGNOSIS — G7100 Muscular dystrophy, unspecified: Secondary | ICD-10-CM | POA: Diagnosis not present

## 2020-04-22 DIAGNOSIS — H8109 Meniere's disease, unspecified ear: Secondary | ICD-10-CM | POA: Diagnosis not present

## 2020-04-22 DIAGNOSIS — I4891 Unspecified atrial fibrillation: Secondary | ICD-10-CM | POA: Diagnosis not present

## 2020-04-22 LAB — BASIC METABOLIC PANEL WITH GFR
BUN/Creatinine Ratio: 31 (calc) — ABNORMAL HIGH (ref 6–22)
BUN: 16 mg/dL (ref 7–25)
CO2: 30 mmol/L (ref 20–32)
Calcium: 9.7 mg/dL (ref 8.6–10.4)
Chloride: 94 mmol/L — ABNORMAL LOW (ref 98–110)
Creat: 0.52 mg/dL — ABNORMAL LOW (ref 0.60–0.93)
GFR, Est African American: 108 mL/min/{1.73_m2} (ref >=60)
GFR, Est Non African American: 93 mL/min/{1.73_m2} (ref >=60)
Glucose, Bld: 93 mg/dL (ref 65–99)
Potassium: 5 mmol/L (ref 3.5–5.3)
Sodium: 132 mmol/L — ABNORMAL LOW (ref 135–146)

## 2020-04-22 LAB — HEPATITIS C ANTIBODY
Hepatitis C Ab: NONREACTIVE
SIGNAL TO CUT-OFF: 0.01 (ref <1.00)

## 2020-04-22 NOTE — Telephone Encounter (Signed)
Noted, has been prescribed by cardiology during her recent ED visit.

## 2020-04-23 ENCOUNTER — Encounter: Payer: Self-pay | Admitting: Adult Health

## 2020-04-23 NOTE — Progress Notes (Signed)
Opened in error

## 2020-04-25 ENCOUNTER — Emergency Department (HOSPITAL_COMMUNITY)
Admission: EM | Admit: 2020-04-25 | Discharge: 2020-04-26 | Disposition: A | Discharge status: Home / Self Care | Payer: Medicare Other | Attending: Emergency Medicine | Admitting: Emergency Medicine

## 2020-04-25 DIAGNOSIS — I491 Atrial premature depolarization: Secondary | ICD-10-CM | POA: Diagnosis not present

## 2020-04-25 DIAGNOSIS — I4891 Unspecified atrial fibrillation: Secondary | ICD-10-CM | POA: Insufficient documentation

## 2020-04-25 DIAGNOSIS — Z7901 Long term (current) use of anticoagulants: Secondary | ICD-10-CM | POA: Insufficient documentation

## 2020-04-25 DIAGNOSIS — Z85828 Personal history of other malignant neoplasm of skin: Secondary | ICD-10-CM | POA: Diagnosis not present

## 2020-04-25 DIAGNOSIS — I480 Paroxysmal atrial fibrillation: Secondary | ICD-10-CM | POA: Diagnosis not present

## 2020-04-25 DIAGNOSIS — R000 Tachycardia, unspecified: Secondary | ICD-10-CM | POA: Diagnosis not present

## 2020-04-25 DIAGNOSIS — I10 Essential (primary) hypertension: Secondary | ICD-10-CM | POA: Diagnosis not present

## 2020-04-25 DIAGNOSIS — R002 Palpitations: Secondary | ICD-10-CM | POA: Diagnosis present

## 2020-04-25 DIAGNOSIS — I499 Cardiac arrhythmia, unspecified: Secondary | ICD-10-CM | POA: Diagnosis not present

## 2020-04-25 NOTE — ED Triage Notes (Signed)
Patient was at home watching tv and eating when she started feeling her heart race. Patient has hx of AFIB, taken off Cardizem 2/11 - changed to metoprolol and taken off Eliquis and changed to Plavix. Patient denies chest pain. Denies syncopal episodes. Patient paged on call cardiologist and they didn't answer, reports she is taking medications as prescribed.

## 2020-04-26 DIAGNOSIS — I4891 Unspecified atrial fibrillation: Secondary | ICD-10-CM | POA: Diagnosis not present

## 2020-04-26 DIAGNOSIS — I480 Paroxysmal atrial fibrillation: Secondary | ICD-10-CM | POA: Diagnosis not present

## 2020-04-26 LAB — CBC
HCT: 39.6 % (ref 36.0–46.0)
Hemoglobin: 13.1 g/dL (ref 12.0–15.0)
MCH: 30.2 pg (ref 26.0–34.0)
MCHC: 33.1 g/dL (ref 30.0–36.0)
MCV: 91.2 fL (ref 80.0–100.0)
Platelets: 261 10*3/uL (ref 150–400)
RBC: 4.34 MIL/uL (ref 3.87–5.11)
RDW: 13.5 % (ref 11.5–15.5)
WBC: 6.6 10*3/uL (ref 4.0–10.5)
nRBC: 0 % (ref 0.0–0.2)

## 2020-04-26 LAB — BASIC METABOLIC PANEL
Anion gap: 7 (ref 5–15)
BUN: 15 mg/dL (ref 8–23)
CO2: 24 mmol/L (ref 22–32)
Calcium: 9.2 mg/dL (ref 8.9–10.3)
Chloride: 103 mmol/L (ref 98–111)
Creatinine, Ser: 0.57 mg/dL (ref 0.44–1.00)
GFR, Estimated: >60 mL/min (ref >60)
Glucose, Bld: 114 mg/dL — ABNORMAL HIGH (ref 70–99)
Potassium: 3.8 mmol/L (ref 3.5–5.1)
Sodium: 134 mmol/L — ABNORMAL LOW (ref 135–145)

## 2020-04-26 LAB — URINALYSIS, ROUTINE W REFLEX MICROSCOPIC
Bacteria, UA: NONE SEEN
Bilirubin Urine: NEGATIVE
Glucose, UA: NEGATIVE mg/dL
Ketones, ur: NEGATIVE mg/dL
Leukocytes,Ua: NEGATIVE
Nitrite: NEGATIVE
Protein, ur: NEGATIVE mg/dL
Specific Gravity, Urine: 1.003 — ABNORMAL LOW (ref 1.005–1.030)
pH: 8 (ref 5.0–8.0)

## 2020-04-26 LAB — MAGNESIUM: Magnesium: 2.3 mg/dL (ref 1.7–2.4)

## 2020-04-26 MED ORDER — FLECAINIDE ACETATE 50 MG PO TABS
50.0000 mg | ORAL_TABLET | Freq: Once | ORAL | Status: AC
  Administered 2020-04-26: 50 mg via ORAL
  Filled 2020-04-26: qty 1

## 2020-04-26 MED ORDER — METOPROLOL TARTRATE 5 MG/5ML IV SOLN
5.0000 mg | Freq: Once | INTRAVENOUS | Status: DC
  Filled 2020-04-26: qty 5

## 2020-04-26 MED ORDER — METOPROLOL SUCCINATE ER 25 MG PO TB24: 12.5000 mg | ORAL_TABLET | Freq: Two times a day (BID) | ORAL | 1 refills | Status: DC

## 2020-04-26 MED ORDER — FLECAINIDE ACETATE 50 MG PO TABS: 50.0000 mg | ORAL_TABLET | Freq: Two times a day (BID) | ORAL | 1 refills | Status: DC

## 2020-04-26 MED ORDER — METOPROLOL TARTRATE 5 MG/5ML IV SOLN
5.0000 mg | Freq: Once | INTRAVENOUS | Status: AC
  Administered 2020-04-26: 5 mg via INTRAVENOUS
  Filled 2020-04-26: qty 5

## 2020-04-26 NOTE — Consult Note (Signed)
Cardiology Consult    Patient ID: Alicia [name] MRN: [id number], DOB/AGE: 76/26/1947   Admit date: 04/25/2020 Date of Consult: 04/26/2020  Primary Physician: Lauree Chandler, NP Primary Cardiologist: No primary care provider on file. Requesting Provider: Octaviano Glow, MD  Patient Profile    Alicia [name] is a 75 y.o. female with a history of migraine disorder, bronchitis, diverticulosis. He is being seen today for the evaluation of paroxysmal atrial fibrillation.  History of Present Illness    75 year old female with history of paroxysmal atrial fibrillation who has had multiple recurrent ED visits and recent admission for new onset paroxysmal atrial fibrillation.  She tells me that she was initially started on diltiazem and apixaban and developed a rash.  She was discharged on this but, given persistent rash and recurrent paroxysms of atrial fibrillation, she represented to ED and was transitioned to rivaroxaban and metoprolol.  With metoprolol succinate 50 mg daily she complains of urinary frequency and insomnia.  Presents today with recurrent atrial fibrillation and associated palpitations, lethargy, and dizziness.  I reviewed with her that I think rhythm control strategy is most appropriate and reviewed options for 1C agent or admission for class 3 agent.  Informed her that with normal coronary arteries and structurally normal heart that I think flecainide would work well for her and she's willing to try it.  Past Medical History   Past Medical History:  Diagnosis Date  . Bronchitis   . 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 Chronic migraine without aura, intractable, without status migrainosus   . Diverticulosis 2010  . Endometriosis   . Low sodium levels   . Malnutrition ([place])   . 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 Meniere's disease, bilateral   . [facility]    [facility]  . Peripheral neuropathy    Dr Erling Cruz  . Pneumonia   . Sensorineural hearing loss,  bilateral   . Vertigo 2019    Past Surgical History:  Procedure Laterality Date  . ABDOMINAL HYSTERECTOMY  1980 or 1981   for Endometriosis  . 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 BREAST ENHANCEMENT SURGERY Bilateral 1979  . COLONOSCOPY  01/2012   Tics; Dr Teena Irani  . ESI      X 3 in 2003 & 02/17/2011 for L4-S1 symptoms  . LUMBAR LAMINECTOMY  12/2012   [facility] , [place]  . TONSILLECTOMY  1950 or 1951  . TUBAL LIGATION    . vocal cords stripped  1970     Allergies  Allergen Reactions  . Biaxin [Clarithromycin] Anaphylaxis  . Sulfonamide Derivatives Itching and Rash  . Tequin [Gatifloxacin] Anaphylaxis  . Demerol Nausea And Vomiting  . Lactose Intolerance (Gi) Other (See Comments)    Abdominal pain, gas, bloating  . Nsaids Other (See Comments)    Reaction:  GI upset/heartburn   . Risedronate Sodium Nausea And Vomiting and Other (See Comments)     GI upset/heartburn   . Rofecoxib Nausea And Vomiting and Other (See Comments)    GI upset/heartburn   . Alendronate Sodium Nausea And Vomiting  . Aspirin Other (See Comments)    Abdominal pain   . Cardizem [Diltiazem Hcl] Hives  . Eliquis [Apixaban] Hives  . Fish Allergy Nausea And Vomiting    Can take fish oil  . Benadryl [Diphenhydramine Hcl] Palpitations   Inpatient Medications    . metoprolol tartrate  5 mg Intravenous Once  Family History    Family History  Problem Relation Age of Onset  . Alcohol abuse Mother   . Hypertension Mother   . Diabetes Mother 54  . Stroke Mother 41  . Dementia Mother   . Kidney failure Mother   . Osteoporosis Mother   . Alzheimer's disease Father 77  . Cancer Maternal Aunt 80       pancreatic  . Arthritis Sister 93       knee replacement  . Hyperlipidemia Sister   . Lung disease Maternal Grandmother   . Lung disease Maternal Grandfather   . Hyperlipidemia Sister 24  . Other Son        recent back injury  . Other Daughter        recent back injury;  knee issues when playing tennis  . Heart disease Neg Hx   . Heart attack Neg Hx    She indicated that her mother is deceased. She indicated that her father is deceased. She indicated that both of her sisters are alive. She indicated that her maternal grandmother is deceased. She indicated that her maternal grandfather is deceased. She indicated that her daughter is alive. She indicated that her son is alive. She indicated that the status of her maternal aunt is unknown. She indicated that the status of her neg hx is unknown.   Social History    Social History   Socioeconomic History  . Marital status: Married    Spouse name: Not on file  . Number of children: Not on file  . Years of education: Not on file  . Highest education level: Not on file  Occupational History  . Not on file  Tobacco Use  . Smoking status: Never Smoker  . Smokeless tobacco: Never Used  Vaping Use  . Vaping Use: Never used  Substance and Sexual Activity  . Alcohol use: Not Currently  . Drug use: No  . Sexual activity: Not Currently  Other Topics Concern  . Not on file  Social History Narrative   Social History      Diet? Trying to gain weight      Do you drink/eat things with caffeine? 1 mug hot tea with breakfast per day      Marital status?  Married 69 years                                  What year were you married? 1970      Do you live in a house, apartment, assisted living, condo, trailer, etc.? House (46 years)      Is it one or more stories? 2 stories and walk up finished attic      How many persons live in your home? 2      Do you have any pets in your home? (please list) none      Highest level of education completed? 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     yes                                 Type & how often? Walk in neighborhood 4 - 6 times per week if possible       [employer] Biochemist, clinical just  beginning to work on these things)      Do you have a living will?      Do you have a DNR form?                    no              If not, do you want to discuss one?      Do you have signed POA/HPOA for forms?       Functional Status      Do you have difficulty bathing or dressing yourself? no      Do you have difficulty preparing food or eating? no      Do you have difficulty managing your medications? no      Do you have difficulty managing your finances? no      Do you have difficulty affording your medications? no   Social Determinants of Health   Financial Resource Strain: Not on file  Food Insecurity: Not on file  Transportation Needs: Not on file  Physical Activity: Not on file  Stress: Not on file  Social Connections: Not on file  Intimate Partner Violence: Not on file     Review of Systems    General:  No chills, fever, night sweats or weight changes.  Cardiovascular:  No chest pain, dyspnea on exertion, edema, orthopnea, palpitations, paroxysmal nocturnal dyspnea. Dermatological: No rash, lesions/masses Respiratory: No cough, dyspnea Urologic: No hematuria, dysuria Abdominal:   No nausea, vomiting, diarrhea, bright red blood per rectum, melena, or hematemesis Neurologic:  No visual changes, wkns, changes in mental status. All other systems reviewed and are otherwise negative except as noted above.  Physical Exam    Blood pressure 132/61, pulse 71, temperature 98.2 F (36.8 C), temperature source Oral, resp. rate 17, SpO2 97 %.    No intake or output data in the 24 hours ending 04/26/20 0417 Wt Readings from Last 3 Encounters:  04/21/20 48.5 kg  04/11/20 47 kg  04/01/20 48.4 kg    CONSTITUTIONAL: alert and conversant, well-appearing, nourished, no distress HEENT: oropharynx clear and moist, no mucosal lesions, normal dentition, conjunctiva normal, EOM intact, pupils equal, no lid lag. NECK: supple, no cervical  adenopathy, no thyromegaly CARDIOVASCULAR: Regular rhythm. No gallop, murmur, or rub. Normal S1/S2. Radial pulses intact. JVP flat No carotid bruits. PULMONARY/CHEST WALL: no deformities, normal breath sounds bilaterally, normal work of breathing ABDOMINAL: soft, non-tender, non-distended EXTREMITIES: no edema or muscle atrophy, warm and well-perfused SKIN: Dry and intact without apparent rashes or wounds. NEUROLOGIC: alert, normal gait, no abnormal movements, cranial nerves grossly intact.   Labs    Na 134, GFR >60, TSH OK, hematology normal   Radiology Studies    DG Chest 2 View  Result Date: 04/05/2020 CLINICAL DATA:  Left-sided chest pain, cough, and fatigue for 1 week. EXAM: CHEST - 2 VIEW COMPARISON:  12/13/2018 FINDINGS: The heart size and mediastinal contours are within normal limits. Aortic atherosclerotic calcification noted. Bilateral upper lobe pleural-parenchymal scarring remains stable. Pulmonary hyperinflation is again noted. No evidence of acute infiltrate or edema. No evidence of pleural effusion. Bilateral breast implants again noted. IMPRESSION: COPD and bilateral upper lobe pleural-parenchymal scarring. No active lung disease. Electronically Signed   By: Marlaine Hind M.D.   On: 04/05/2020 18:26   CT ANGIO CHEST PE W OR WO CONTRAST  Result Date: 04/08/2020 CLINICAL DATA:  Shortness of breath, chest heaviness. EXAM: CT ANGIOGRAPHY CHEST WITH CONTRAST TECHNIQUE: Multidetector CT imaging  of the chest was performed using the standard protocol during bolus administration of intravenous contrast. Multiplanar CT image reconstructions and MIPs were obtained to evaluate the vascular anatomy. CONTRAST:  19mL OMNIPAQUE IOHEXOL 350 MG/ML SOLN COMPARISON:  None. FINDINGS: Cardiovascular: Satisfactory opacification of the pulmonary arteries to the segmental level. No evidence of pulmonary embolism. Normal heart size. No pericardial effusion. Atherosclerosis of thoracic aorta is noted without  aneurysm formation. Mediastinum/Nodes: No enlarged mediastinal, hilar, or axillary lymph nodes. Thyroid gland, trachea, and esophagus demonstrate no significant findings. Lungs/Pleura: No pneumothorax or pleural effusion is noted. Mild to moderate bilateral apical scarring is noted. Upper Abdomen: No acute abnormality. Musculoskeletal: No chest wall abnormality. No acute or significant osseous findings. Review of the MIP images confirms the above findings. IMPRESSION: 1. No definite evidence of pulmonary embolus. 2. Mild to moderate bilateral apical scarring is noted. 3. Aortic atherosclerosis. Aortic Atherosclerosis (ICD10-I70.0). Electronically Signed   By: Marijo Conception M.D.   On: 04/08/2020 12:09   CT CORONARY MORPH W/CTA COR W/SCORE W/CA W/CM &/OR WO/CM  Addendum Date: 04/09/2020   ADDENDUM REPORT: 04/09/2020 11:49 CLINICAL DATA:  75 year old female with shortness of breath and palpitation. EXAM: Cardiac/Coronary  CTA TECHNIQUE: The patient was scanned on a [employer]. FINDINGS: A 100 kV prospective scan was triggered in the descending thoracic aorta at 111 HU's. Axial non-contrast 3 mm slices were carried out through the heart. The data set was analyzed on a dedicated work station and scored using the [place]. Gantry rotation speed was 250 msecs and collimation was .6 mm. 10 mg of PO Ivabradine and 0.8 mg of sl NTG were given. The 3D data set was reconstructed in 5% intervals of the 67-82 % of the R-R cycle. Diastolic phases were analyzed on a dedicated work station using MPR, MIP and VRT modes. The patient received 80 cc of contrast. Aorta: Normal size. Mild diffuse atherosclerotic plaque with diffuse calcifications. No dissection. Aortic Valve:  Trileaflet.  Mild cusp calcifications. Coronary Arteries:  Normal coronary origin.  Right dominance. RCA is a large dominant artery that gives rise to PDA and PLA. There is no plaque. Left main is a large artery that gives rise to LAD and LCX  arteries. Let main has no plaque. LAD is a large tortuous vessel that gives rise to two diagonal arteries and has no plaque. D1,2 are medium size arteries with no plaque. LCX is a very small non-dominant artery that has no plaque. Other findings: Normal pulmonary vein drainage into the left atrium. Normal left atrial appendage without a thrombus. 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 IMPRESSION: 1. Coronary calcium score of 0. This was 0 percentile for age and sex matched control. 2. Normal coronary origin with right dominance. 3. CAD-RADS 0. No evidence of CAD (0%). Consider non-atherosclerotic causes of chest pain. 4. 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 Electronically Signed   By: Ena Dawley   On: 04/09/2020 11:49   Result Date: 04/09/2020 EXAM: OVER-READ INTERPRETATION  CT CHEST The following report is an over-read performed by radiologist Dr. Vinnie Langton of [facility], [place] on 04/09/2020. This over-read does not include interpretation of cardiac or coronary anatomy or pathology. The coronary calcium score/coronary CTA interpretation by the cardiologist is attached. COMPARISON:  None. FINDINGS: Aortic atherosclerosis. Within the visualized portions of the thorax there are no suspicious appearing pulmonary nodules or masses, there is no acute consolidative airspace disease, no pleural effusions, no pneumothorax and no lymphadenopathy. Visualized portions of  the upper abdomen are unremarkable. There are no aggressive appearing lytic or blastic lesions noted in the visualized portions of the skeleton. Bilateral breast implants with dense capsular calcifications are incidentally noted. IMPRESSION: 1.  Aortic Atherosclerosis (ICD10-I70.0). Electronically Signed: By: Vinnie Langton M.D. On: 04/09/2020 11:02   DG Chest Portable 1 View  Result Date: 04/16/2020 CLINICAL DATA:  Shortness of breath and atrial fibrillation. EXAM: PORTABLE CHEST 1 VIEW COMPARISON:  CT a chest  dated April 08, 2020. Chest x-ray dated April 05, 2020. FINDINGS: The heart size and mediastinal contours are within normal limits. Normal pulmonary vascularity. The lungs remain hyperinflated with biapical pleuroparenchymal scarring. No focal consolidation, pleural effusion, or pneumothorax. No acute osseous abnormality. IMPRESSION: 1. COPD. No active disease. Electronically Signed   By: Titus Dubin M.D.   On: 04/16/2020 12:14   ECHOCARDIOGRAM COMPLETE  Result Date: 04/06/2020    ECHOCARDIOGRAM REPORT   Patient Name:   Alicia [name] Date of Exam: 04/06/2020 Medical Rec #:  [id number]         Height:       66.0 in Accession #:    [id number]        Weight:       105.0 lb Date of Birth:  01-Jan-1946        BSA:          1.521 m Patient Age:    56 years          BP:           113/65 mmHg Patient Gender: F                 HR:           82 bpm. Exam Location:  Inpatient Procedure: 2D Echo, Color Doppler and Cardiac Doppler Indications:    R07.9* Chest pain, unspecified  History:        Patient has prior history of Echocardiogram examinations, most                 recent 07/11/2012. Arrythmias:Atrial Fibrillation; Risk                 Factors:Dyslipidemia.  Sonographer:    Raquel Sarna Senior RDCS Referring Phys: [id number] [place]  Sonographer Comments: Very poor echo windows due to rib shadowing and implants. Patient unable to tolerate any probe pressure. IMPRESSIONS  1. Left ventricular ejection fraction, by estimation, is 60 to 65%. The left ventricle has normal function. The left ventricle has no regional wall motion abnormalities. Left ventricular diastolic parameters were normal.  2. Right ventricular systolic function is normal. The right ventricular size is normal.  3. The mitral valve is normal in structure. No evidence of mitral valve regurgitation. No evidence of mitral stenosis.  4. The aortic valve is normal in structure. Aortic valve regurgitation is not visualized. No aortic stenosis is present.  5. The  inferior vena cava is normal in size with greater than 50% respiratory variability, suggesting right atrial pressure of 3 mmHg. FINDINGS  Left Ventricle: Left ventricular ejection fraction, by estimation, is 60 to 65%. The left ventricle has normal function. The left ventricle has no regional wall motion abnormalities. The left ventricular internal cavity size was normal in size. There is  no left ventricular hypertrophy. Left ventricular diastolic parameters were normal. Indeterminate filling pressures. Right Ventricle: The right ventricular size is normal. No increase in right ventricular wall thickness. Right ventricular systolic function is normal. Left Atrium: Left atrial size was normal  in size. Right Atrium: Right atrial size was normal in size. Pericardium: There is no evidence of pericardial effusion. Mitral Valve: The mitral valve is normal in structure. Mild mitral annular calcification. No evidence of mitral valve regurgitation. No evidence of mitral valve stenosis. Tricuspid Valve: The tricuspid valve is normal in structure. Tricuspid valve regurgitation is not demonstrated. No evidence of tricuspid stenosis. Aortic Valve: The aortic valve is normal in structure. Aortic valve regurgitation is not visualized. No aortic stenosis is present. Pulmonic Valve: The pulmonic valve was normal in structure. Pulmonic valve regurgitation is not visualized. No evidence of pulmonic stenosis. Aorta: The aortic root is normal in size and structure. Venous: The inferior vena cava is normal in size with greater than 50% respiratory variability, suggesting right atrial pressure of 3 mmHg. IAS/Shunts: No atrial level shunt detected by color flow Doppler.  LEFT VENTRICLE PLAX 2D LVIDd:         3.80 cm  Diastology LVIDs:         2.60 cm  LV e' medial:    6.64 cm/s LV PW:         0.80 cm  LV E/e' medial:  14.2 LV IVS:        0.80 cm  LV e' lateral:   8.16 cm/s LVOT diam:     2.00 cm  LV E/e' lateral: 11.6 LV SV:         66 LV  SV Index:   43 LVOT Area:     3.14 cm  RIGHT VENTRICLE RV S prime:     13.50 cm/s TAPSE (M-mode): 2.6 cm LEFT ATRIUM           Index       RIGHT ATRIUM           Index LA diam:      2.60 cm 1.71 cm/m  RA Area:     14.90 cm LA Vol (A4C): 39.2 ml 25.78 ml/m RA Volume:   38.50 ml  25.32 ml/m  AORTIC VALVE LVOT Vmax:   88.80 cm/s LVOT Vmean:  62.300 cm/s LVOT VTI:    0.209 m  AORTA Ao Root diam: 2.30 cm MITRAL VALVE MV Area (PHT): 4.60 cm    SHUNTS MV Decel Time: 165 msec    Systemic VTI:  0.21 m MV E velocity: 94.30 cm/s  Systemic Diam: 2.00 cm MV A velocity: 80.60 cm/s MV E/A ratio:  1.17 Mihai Croitoru MD Electronically signed by Sanda Klein MD Signature Date/Time: 04/06/2020/3:47:55 PM    Final     ECG & Cardiac Imaging    ECG reviewed AF RVR TTE structurally normal heart CTA 2021 without coronary plaque and calcium score of zero.  Assessment & Plan    75 year old female with past medical history as above, notable for paroxysmal atrial fibrillation presents with recurrent atrial fibrillation.  Structurally normal heart with normal coronary arteries.  Reviewed antiarrhythmic options and she is willing to try out flecainide to see how she tolerates it and whether it prevents recurrence.  If she fails this then will need class 3 agent.  Emphasized it's important she attend clinic appointments this week as I would like her to get an ECG to follow-up QRS, PR intervals after starting getting loaded on the medication.  Reviewed importance of taking beta blocker alongside 1C.  Needs to continue anticoagulation uninterrupted.  #Paroxysmal atrial fibrillation, C2V at least 2 - Start flecainide 50 mg BID - Dose reduce to metoprolol succinate 12.5 mg daily -  Continue rivaroxaban 20 mg daily - Attend scheduled follow-up 2/22, 2/23  C. Albertha Ghee, MD Cardiology Fellow [facility]   Signed, Delight Hoh, MD 04/26/2020, 4:17 AM  For questions or updates, please contact   Please consult  [email or web address] for contact info under Cardiology/STEMI.

## 2020-04-26 NOTE — ED Notes (Signed)
Dr. Langston Masker made aware that patient converted to NS in 9s while RN was in room about to give metoprolol. VO given to not admin same.

## 2020-04-26 NOTE — ED Provider Notes (Signed)
[facility] EMERGENCY DEPARTMENT Provider Note   CSN: [id number] Arrival date & time: 04/25/20  2351     History Chief Complaint  Patient presents with  . Tachycardia    Alicia [name] is a 75 y.o. female with a history of paroxysmal A. fib on Xarelto and metoprolol presenting to the emergency department with palpitations.  The patient reports that around 10 PM this evening she began having palpitations and felt like she had gone back into A. fib.  She was seen in the emergency department by myself 10 days ago on February 11, then having paroxysmal A. fib, and at that time we made the decision to switch her from diltiazem and Eliquis to metoprolol 50 mg twice daily and Xarelto.  While in the emergency department she converted back into normal sinus rhythm at that time. Her medications were changed because she was having side effects from these medications, with the pruritic diffuse rash.  She reports the rash is resolved with switching her medications.  She does report urinary frequency for the past several days which she attributed to metoprolol.  She also reports some insomnia.  Ever she has not had any A. fib that she is aware of in the past several days.  She has been compliant with her anticoagulation.  She has an upcoming appointment in the [facility] in 2 days on Tuesday.  HPI     Past Medical History:  Diagnosis Date  . Bronchitis   . 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 Chronic migraine without aura, intractable, without status migrainosus   . Diverticulosis 2010  . Endometriosis   . Low sodium levels   . Malnutrition ([place])   . MD (muscular dystrophy) ([place])   . Meniere's disease, bilateral   . [facility]    [facility]  . Peripheral neuropathy    Dr Erling Cruz  . Pneumonia   . Sensorineural hearing loss, bilateral   . Vertigo 2019    Patient Active Problem List   Diagnosis Date Noted  . Atrial fibrillation with RVR ([place]) 04/06/2020  .  Atypical chest pain 04/06/2020  . Acute bronchitis 12/21/2015  . Chest pain 10/19/2014  . Carotid stenosis 10/19/2014  . Diverticulosis of colon without hemorrhage 08/20/2012  . Heart palpitations 07/04/2012  . Chronic hyponatremia 07/04/2012  . Lumbar radiculopathy 06/03/2012  . 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 Muscular dystrophy ([place]) 11/23/2011  . PARESTHESIA 11/11/2009  . PERIPHERAL NEUROPATHY 01/29/2008  . Disorder of bone and cartilage 01/27/2008  . SKIN CANCER, HX OF 01/27/2008  . GOITER, NONTOXIC MULTINODULAR 10/23/2006  . HYPERLIPIDEMIA NEC/NOS 10/23/2006  . FIBROMYALGIA 10/23/2006    Past Surgical History:  Procedure Laterality Date  . ABDOMINAL HYSTERECTOMY  1980 or 1981   for Endometriosis  . 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 BREAST ENHANCEMENT SURGERY Bilateral 1979  . COLONOSCOPY  01/2012   Tics; Dr Teena Irani  . ESI      X 3 in 2003 & 02/17/2011 for L4-S1 symptoms  . LUMBAR LAMINECTOMY  12/2012   [facility] , [place]  . TONSILLECTOMY  1950 or 1951  . TUBAL LIGATION    . vocal cords stripped  1970     OB History   No obstetric history on file.     Family History  Problem Relation Age of Onset  . Alcohol abuse Mother   . Hypertension Mother   . Diabetes Mother 29  .  Stroke Mother 61  . Dementia Mother   . Kidney failure Mother   . Osteoporosis Mother   . Alzheimer's disease Father 62  . Cancer Maternal Aunt 80       pancreatic  . Arthritis Sister 28       knee replacement  . Hyperlipidemia Sister   . Lung disease Maternal Grandmother   . Lung disease Maternal Grandfather   . Hyperlipidemia Sister 39  . Other Son        recent back injury  . Other Daughter        recent back injury; knee issues when playing tennis  . Heart disease Neg Hx   . Heart attack Neg Hx     Social History   Tobacco Use  . Smoking status: Never Smoker  . Smokeless tobacco: Never Used  Vaping Use  . Vaping Use: Never used  Substance Use  Topics  . Alcohol use: Not Currently  . Drug use: No    Home Medications Prior to Admission medications   Medication Sig Start Date End Date Taking? Authorizing Provider  acetaminophen (TYLENOL) 500 MG tablet Take 500 mg by mouth at bedtime.   Yes [provider]  AZELAIC ACID EX Apply 1 application topically 2 (two) times daily. Right after metronidazole cream   Yes [provider]  baclofen (LIORESAL) 10 MG tablet Take 10 mg by mouth at bedtime.   Yes [provider]  Biotin 5000 MCG TABS Take 5,000 mcg by mouth daily.    Yes [provider]  butalbital-acetaminophen-caffeine (FIORICET, ESGIC) 50-325-40 MG tablet Take 1 tablet by mouth every 6 (six) hours as needed for migraine (for severe pain).   Yes [provider]  CALCIUM PO Take 1,200 mg by mouth 2 (two) times daily.   Yes [provider]  Carboxymethylcellulose Sodium 0.25 % SOLN Apply 1 drop to eye 3 (three) times daily.   Yes [provider]  Cholecalciferol (VITAMIN D3) LIQD Take 1 drop by mouth See admin instructions. Takes 1 drop by mouth every day except Sat and Sun   Yes [provider]  Evening Primrose Oil 1000 MG CAPS Take 1,000 mg by mouth every evening.   Yes [provider]  [place] 0.05 MG/24HR RING Place 1 each vaginally every 3 (three) months. 02/10/19  Yes [provider]  flecainide (TAMBOCOR) 50 MG tablet Take 1 tablet (50 mg total) by mouth 2 (two) times daily. 04/26/20 05/26/20 Yes Ilsa Bonello, Carola Rhine, MD  gabapentin (NEURONTIN) 100 MG capsule Take 200 mg by mouth at bedtime.    Yes [provider]  lactase (LACTAID) 3000 units tablet Take 3,000 Units by mouth daily as needed (when eating dairy products).   Yes [provider]  Lutein 20 MG TABS Take 20 mg by mouth daily.    Yes [provider]  MAGNESIUM CITRATE PO Take 300 mg by mouth at bedtime.   Yes [provider]  meclizine (ANTIVERT)  12.5 MG tablet Take 1 tablet (12.5 mg total) by mouth 3 (three) times daily as needed for dizziness. Patient taking differently: Take 12.5 mg by mouth 3 (three) times daily as needed (vertigo). 09/14/19  Yes Maudie Flakes, MD  methocarbamol (ROBAXIN) 750 MG tablet Take 750 mg by mouth every 6 (six) hours.   Yes [provider]  metoprolol succinate (TOPROL-XL) 25 MG 24 hr tablet Take 0.5 tablets (12.5 mg total) by mouth in the morning and at bedtime. 04/26/20 05/26/20 Yes [address],  Carola Rhine, MD  metroNIDAZOLE (METROGEL) 0.75 % gel Apply 1 application topically 2 (two) times daily. Apply to face for rosacea   Yes [provider]  Multiple Vitamin (MULTIVITAMIN WITH MINERALS) TABS tablet Take 1 tablet by mouth daily.   Yes [provider]  Multiple Vitamins-Minerals (PRESERVISION AREDS 2) CAPS Take 1 capsule by mouth 2 (two) times daily.   Yes [provider]  Omega-3 1000 MG CAPS Take 1,000 mg by mouth 2 (two) times daily.   Yes [provider]  ondansetron (ZOFRAN-ODT) 4 MG disintegrating tablet Take 1 tablet (4 mg total) by mouth every 6 (six) hours as needed for nausea or vomiting. 04/12/20  Yes Rai, Ripudeep K, MD  rivaroxaban (XARELTO) 20 MG TABS tablet Take 1 tablet (20 mg total) by mouth daily with supper. 04/17/20  Yes Wyvonnia Dusky, MD  sodium chloride 1 g tablet Take 1 tablet (1 g total) by mouth 2 (two) times daily with a meal. 04/12/20  Yes Rai, Ripudeep K, MD  SUMAtriptan (IMITREX) 50 MG tablet Take 1 tablet (50 mg total) by mouth every 2 (two) hours as needed for migraine. May repeat in 2 hours if headache persists or recurs. 04/12/20  Yes Rai, Ripudeep K, MD  apixaban (ELIQUIS) 5 MG TABS tablet Take 1 tablet (5 mg total) by mouth 2 (two) times daily. 04/12/20 04/16/20  Rai, Vernelle Emerald, MD  diltiazem (CARDIZEM CD) 120 MG 24 hr capsule Take 1 capsule (120 mg total) by mouth daily. 04/12/20 04/16/20  Mendel Corning, MD    Allergies    Biaxin  [clarithromycin], Sulfonamide derivatives, Tequin [gatifloxacin], Demerol, Lactose intolerance (gi), Nsaids, Risedronate sodium, Rofecoxib, Alendronate sodium, Aspirin, Cardizem [diltiazem hcl], Eliquis [apixaban], Fish allergy, and Benadryl [diphenhydramine hcl]  Review of Systems   Review of Systems  Constitutional: Negative for chills and fever.  Eyes: Negative for pain and visual disturbance.  Respiratory: Negative for cough and shortness of breath.   Cardiovascular: Positive for palpitations. Negative for chest pain.  Gastrointestinal: Negative for abdominal pain and vomiting.  Genitourinary: Positive for frequency. Negative for dysuria.  Musculoskeletal: Negative for arthralgias and back pain.  Skin: Negative for color change and rash.  Neurological: Positive for light-headedness. Negative for syncope.  All other systems reviewed and are negative.   Physical Exam Updated Vital Signs BP 132/66   Pulse 70   Temp 98.2 F (36.8 C) (Oral)   Resp 20   SpO2 99%   Physical Exam Constitutional:      General: She is not in acute distress. HENT:     Head: Normocephalic and atraumatic.  Eyes:     Conjunctiva/sclera: Conjunctivae normal.     Pupils: Pupils are equal, round, and reactive to light.  Cardiovascular:     Rate and Rhythm: Tachycardia present. Rhythm irregular.  Pulmonary:     Effort: Pulmonary effort is normal. No respiratory distress.  Abdominal:     General: There is no distension.     Tenderness: There is no abdominal tenderness.  Skin:    General: Skin is warm and dry.  Neurological:     General: No focal deficit present.     Mental Status: She is alert. Mental status is at baseline.  Psychiatric:        Mood and Affect: Mood normal.        Behavior: Behavior normal.     ED Results / Procedures / Treatments   Labs (all labs ordered are listed, but only abnormal results are  displayed) Labs Reviewed  BASIC METABOLIC PANEL - Abnormal; Notable for the  following components:      Result Value   Sodium 134 (*)    Glucose, Bld 114 (*)    All other components within normal limits  URINALYSIS, ROUTINE W REFLEX MICROSCOPIC - Abnormal; Notable for the following components:   Color, Urine COLORLESS (*)    Specific Gravity, Urine 1.003 (*)    Hgb urine dipstick SMALL (*)    All other components within normal limits  MAGNESIUM  CBC    EKG EKG Interpretation  Date/Time:  Sunday April 25 2020 23:58:49 EST Ventricular Rate:  120 PR Interval:    QRS Duration: 94 QT Interval:  311 QTC Calculation: 440 R Axis:   46 Text Interpretation: Atrial fibrillation Ventricular premature complex RSR' in V1 or V2, probably normal variant Borderline repolarization abnormality No STEMI Confirmed by Octaviano Glow [phone]) on 04/26/2020 1:51:33 AM   Radiology No results found.  Procedures .Critical Care Performed by: Wyvonnia Dusky, MD Authorized by: Wyvonnia Dusky, MD   Critical care provider statement:    Critical care time (minutes):  45   Critical care was necessary to treat or prevent imminent or life-threatening deterioration of the following conditions:  Circulatory failure   Critical care was time spent personally by me on the following activities:  Discussions with consultants, evaluation of patient's response to treatment, examination of patient, ordering and performing treatments and interventions, ordering and review of laboratory studies, ordering and review of radiographic studies, pulse oximetry, re-evaluation of patient's condition, obtaining history from patient or surrogate and review of old charts Comments:     A Fib with RVR rate control with IV metoprolol     Medications Ordered in ED Medications  metoprolol tartrate (LOPRESSOR) injection 5 mg (5 mg Intravenous Not Given 04/26/20 0238)  metoprolol tartrate (LOPRESSOR) injection 5 mg (5 mg Intravenous Given 04/26/20 0051)  flecainide (TAMBOCOR) tablet 50 mg (50 mg Oral  Given 04/26/20 0450)    ED Course  I have reviewed the triage vital signs and the nursing notes.  Pertinent labs & imaging results that were available during my care of the patient were reviewed by me and considered in my medical decision making (see chart for details).  A Fib with RVR, paroxysmal, HR 110-130bpm here, palpitations and lightheaded BP stable  Labs reviewed - electrolytes wnl No signs or symptoms of infection recently  IV metoprolol given with no initial improvement, then patient converted back into NSR Cardiology consulted - see consult note - recommended starting on flecainide 50 mg BID and continuing metoprolol 12.5 mg BID.  Patient has A Fib clinic visit on Tuesday, can f/u then for repeat ECG Can also continue xarelto for now  Po flecainide ordered here, then can be discharged  Clinical Course as of 04/26/20 0531  Mon Apr 26, 2020  0229 Cardiology fellow to see patient.  She remains in A Fib HR 110-120 bpm.  We'll try a 2nd dose of metoprolol. [MT]  1601 Reverted to NSR without receiving 2nd dose of toprol.  We'll continue to monitor [MT]  0448 Still in sinus rhythm.  Awaiting flecainide dose and can discharge home. [MT]    Clinical Course User Index [MT] Nell Gales, Carola Rhine, MD    Final Clinical Impression(s) / ED Diagnoses Final diagnoses:  Atrial fibrillation with RVR [facility])    Rx / DC Orders ED Discharge Orders         Ordered  Amb referral to AFIB Clinic        04/26/20 0022    flecainide (TAMBOCOR) 50 MG tablet  2 times daily        04/26/20 0453    metoprolol succinate (TOPROL-XL) 25 MG 24 hr tablet  2 times daily        04/26/20 0453           Wyvonnia Dusky, MD 04/26/20 [phone]

## 2020-04-26 NOTE — Discharge Instructions (Signed)
You will start flecainide twice per day as prescribed.  Your next dose is due in the evening of 04/26/20.  You will continue metoprolol but at a lower dose.  Now you will take 12.5 mg twice per day.  Your next dose is also due the evening of 04/26/20.  Continue your xarelto.  Follow up in the A Fib clinic on Tuesday as scheduled.

## 2020-04-27 ENCOUNTER — Encounter (HOSPITAL_COMMUNITY): Payer: Self-pay | Admitting: Physician Assistant

## 2020-04-27 ENCOUNTER — Telehealth: Payer: Self-pay | Admitting: Cardiology

## 2020-04-27 ENCOUNTER — Ambulatory Visit (HOSPITAL_COMMUNITY)
Admission: RE | Admit: 2020-04-27 | Discharge: 2020-04-27 | Disposition: A | Discharge status: Home / Self Care | Payer: Medicare Other | Source: Ambulatory Visit | Attending: Physician Assistant | Admitting: Physician Assistant

## 2020-04-27 ENCOUNTER — Other Ambulatory Visit: Payer: Self-pay

## 2020-04-27 VITALS — BP 122/78 | HR 72 | Ht 66.0 in | Wt 106.0 lb

## 2020-04-27 DIAGNOSIS — D6869 Other thrombophilia: Secondary | ICD-10-CM | POA: Insufficient documentation

## 2020-04-27 DIAGNOSIS — Z79899 Other long term (current) drug therapy: Secondary | ICD-10-CM | POA: Insufficient documentation

## 2020-04-27 DIAGNOSIS — E871 Hypo-osmolality and hyponatremia: Secondary | ICD-10-CM | POA: Diagnosis not present

## 2020-04-27 DIAGNOSIS — H8103 Meniere's disease, bilateral: Secondary | ICD-10-CM | POA: Diagnosis not present

## 2020-04-27 DIAGNOSIS — G7100 Muscular dystrophy, unspecified: Secondary | ICD-10-CM | POA: Insufficient documentation

## 2020-04-27 DIAGNOSIS — I779 Disorder of arteries and arterioles, unspecified: Secondary | ICD-10-CM | POA: Diagnosis not present

## 2020-04-27 DIAGNOSIS — Z8249 Family history of ischemic heart disease and other diseases of the circulatory system: Secondary | ICD-10-CM | POA: Diagnosis not present

## 2020-04-27 DIAGNOSIS — Z7901 Long term (current) use of anticoagulants: Secondary | ICD-10-CM | POA: Diagnosis not present

## 2020-04-27 DIAGNOSIS — I951 Orthostatic hypotension: Secondary | ICD-10-CM | POA: Diagnosis not present

## 2020-04-27 DIAGNOSIS — H9190 Unspecified hearing loss, unspecified ear: Secondary | ICD-10-CM | POA: Insufficient documentation

## 2020-04-27 DIAGNOSIS — I4891 Unspecified atrial fibrillation: Secondary | ICD-10-CM | POA: Diagnosis not present

## 2020-04-27 DIAGNOSIS — H8109 Meniere's disease, unspecified ear: Secondary | ICD-10-CM | POA: Diagnosis not present

## 2020-04-27 DIAGNOSIS — G43009 Migraine without aura, not intractable, without status migrainosus: Secondary | ICD-10-CM | POA: Diagnosis not present

## 2020-04-27 DIAGNOSIS — I480 Paroxysmal atrial fibrillation: Secondary | ICD-10-CM | POA: Insufficient documentation

## 2020-04-27 MED ORDER — RIVAROXABAN 20 MG PO TABS: 20.0000 mg | ORAL_TABLET | Freq: Every day | ORAL | 1 refills | Status: DC

## 2020-04-27 NOTE — Telephone Encounter (Signed)
   Patient / home health called answering service this evening. When I initially tried to return call several times the line was busy and did not work. I called answering service back to let them know and they listened to VM and realized they had transcribed phone number wrong to the call pager. I called the corrected number and reached patient. Chart reviewed, recent issues with recurrent PAF requiring multiple ER visits. Also has had issues with tendency for low-normal BP limiting therapy. Cath and echo reassuring. Started on flecainide yesterday. Saw Ricky in afib clinic today and was doing great. She states she was told to only take metoprolol 12.5mg  daily but note says BID. Sounds like there was some discussion per her report in the ED about whether this should be daily or BID. This afternoon she felt herself flip back into atrial fib with a current HR around 110. She feels bothered by palpitations when this happens and some mild dyspnea but no acute duress. BP stable at 111/72. She just took another flecainide for her PM dose and was wondering if she could take the other half of the metoprolol this evening and see how she does. I think this is a good place to start since she was only just initiated on flecainide yesterday and is otherwise hemodynamically stable. She will notify our office this evening with update if symptoms persist/worsen, otherwise advised she reach out to afib clinic tomorrow to discuss dosing plan for further increase in flecainide. Also has f/u tomorrow afternoon with PCP. She verbalized understanding and gratitude. Will route to [facility] for review. Aarushi Hemric PA-C

## 2020-04-27 NOTE — Progress Notes (Unsigned)
Cardiology Office Note    Date:  04/28/2020   ID:  Camrie, Stock Jun 03, 1945, MRN [id number]   PCP:  Lauree Chandler, NP   [place]  Cardiologist:  No primary care provider on file.  Advanced Practice Provider:  No care team member to display Electrophysiologist:  None   [id number]   Chief Complaint  Patient presents with  . Follow-up    History of Present Illness:  Alicia [name] is a 75 y.o. female with history of PAF with multiple ER visits for such.  Initially started on diltiazem and apixaban and developed a rash.  She then was transitioned to rivaroxaban and metoprolol.  Patient back in the ER 04/26/2020 and was seen by cardiac fellow.  Patient was complaining of urinary frequency and insomnia on metoprolol.  Also having recurrent atrial fibrillation with palpitations lethargy and dizziness.  CTA 2021 without coronary plaque and calcium score of 0. she was started on flecainide 50 mg twice daily reduce metoprolol 12.5 mg daily and Xarelto 20 mg daily.  She was seen in the A. fib clinic yesterday and had converted to normal sinus rhythm.  Patient went back into Afib about 5pm last night. Spoke with Melina Copa PA-C. Patient went ahead and took her flecainide early and an extra 12.5 mg metoprolol. Lasted about 2 1/2-3 hrs. She feels terrible in Afib-short of breath and rapid HR. EKG done yest and declines today b/c allergic to stickers.  Past Medical History:  Diagnosis Date  . Bronchitis   . 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 Chronic migraine without aura, intractable, without status migrainosus   . Diverticulosis 2010  . Endometriosis   . Low sodium levels   . Malnutrition ([place])   . MD (muscular dystrophy) ([place])   . Meniere's disease, bilateral   . [facility]openia    [facility]  . Peripheral neuropathy    Dr Erling Cruz  . Pneumonia   . Sensorineural hearing loss, bilateral   . Vertigo 2019    Past Surgical History:   Procedure Laterality Date  . ABDOMINAL HYSTERECTOMY  1980 or 1981   for Endometriosis  . 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 BREAST ENHANCEMENT SURGERY Bilateral 1979  . COLONOSCOPY  01/2012   Tics; Dr Teena Irani  . ESI      X 3 in 2003 & 02/17/2011 for L4-S1 symptoms  . LUMBAR LAMINECTOMY  12/2012   [facility] , [place]  . TONSILLECTOMY  1950 or 1951  . TUBAL LIGATION    . vocal cords stripped  1970    Current Medications: Current Meds  Medication Sig  . acetaminophen (TYLENOL) 500 MG tablet Take 500 mg by mouth at bedtime.  . AZELAIC ACID EX Apply 1 application topically 2 (two) times daily. Right after metronidazole cream  . baclofen (LIORESAL) 10 MG tablet Take 10 mg by mouth at bedtime.  . Biotin 5000 MCG TABS Take 5,000 mcg by mouth daily.   . butalbital-acetaminophen-caffeine (FIORICET, ESGIC) 50-325-40 MG tablet Take 1 tablet by mouth every 6 (six) hours as needed for migraine (for severe pain).  . CALCIUM PO Take 1,200 mg by mouth 2 (two) times daily.  . Carboxymethylcellulose Sodium 0.25 % SOLN Apply 1 drop to eye 3 (three) times daily.  . Cholecalciferol (VITAMIN D3) LIQD Take 1 drop by mouth See admin instructions. Takes 1 drop by mouth every day except Sat and Sun  .  Evening Primrose Oil 1000 MG CAPS Take 1,000 mg by mouth every evening.  Marland Kitchen FEMRING 0.05 MG/24HR RING Place 1 each vaginally every 3 (three) months.  . flecainide (TAMBOCOR) 50 MG tablet Take 1 tablet (50 mg total) by mouth 2 (two) times daily.  Marland Kitchen gabapentin (NEURONTIN) 100 MG capsule Take 200 mg by mouth at bedtime.   Marland Kitchen lactase (LACTAID) 3000 units tablet Take 3,000 Units by mouth daily as needed (when eating dairy products).  . Lutein 20 MG TABS Take 20 mg by mouth daily.   Marland Kitchen MAGNESIUM CITRATE PO Take 300 mg by mouth at bedtime.  . meclizine (ANTIVERT) 12.5 MG tablet Take 1 tablet (12.5 mg total) by mouth 3 (three) times daily as needed for dizziness.  . methocarbamol (ROBAXIN) 750 MG  tablet Take 750 mg by mouth every 6 (six) hours.  . metoprolol succinate (TOPROL-XL) 25 MG 24 hr tablet Take 0.5 tablets (12.5 mg total) by mouth in the morning and at bedtime.  . metroNIDAZOLE (METROGEL) 0.75 % gel Apply 1 application topically 2 (two) times daily. Apply to face for rosacea  . Multiple Vitamin (MULTIVITAMIN WITH MINERALS) TABS tablet Take 1 tablet by mouth daily.  . Multiple Vitamins-Minerals (PRESERVISION AREDS 2) CAPS Take 1 capsule by mouth 2 (two) times daily.  . Omega-3 1000 MG CAPS Take 1,000 mg by mouth 2 (two) times daily.  . ondansetron (ZOFRAN-ODT) 4 MG disintegrating tablet Take 1 tablet (4 mg total) by mouth every 6 (six) hours as needed for nausea or vomiting.  . rivaroxaban (XARELTO) 20 MG TABS tablet Take 1 tablet (20 mg total) by mouth daily with supper.  . sodium chloride 1 g tablet Take 1 tablet (1 g total) by mouth 2 (two) times daily with a meal.  . SUMAtriptan (IMITREX) 50 MG tablet Take 1 tablet (50 mg total) by mouth every 2 (two) hours as needed for migraine. May repeat in 2 hours if headache persists or recurs.     Allergies:   Biaxin [clarithromycin], Sulfonamide derivatives, Tequin [gatifloxacin], Demerol, Lactose intolerance (gi), Nsaids, Risedronate sodium, Rofecoxib, Alendronate sodium, Aspirin, Cardizem [diltiazem hcl], Eliquis [apixaban], Fish allergy, and Benadryl [diphenhydramine hcl]   Social History   Socioeconomic History  . Marital status: Married    Spouse name: Not on file  . Number of children: Not on file  . Years of education: Not on file  . Highest education level: Not on file  Occupational History  . Not on file  Tobacco Use  . Smoking status: Never Smoker  . Smokeless tobacco: Never Used  Vaping Use  . Vaping Use: Never used  Substance and Sexual Activity  . Alcohol use: Not Currently  . Drug use: Never  . Sexual activity: Not Currently  Other Topics Concern  . Not on file  Social History Narrative   Social History       Diet? Trying to gain weight      Do you drink/eat things with caffeine? 1 mug hot tea with breakfast per day      Marital status?  Married 26 years                                  What year were you married? 1970      Do you live in a house, apartment, assisted living, condo, trailer, etc.? House (46 years)      Is it one or more stories? 2 stories  and walk up finished attic      How many persons live in your home? 2      Do you have any pets in your home? (please list) none      Highest level of education completed? 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     yes                                 Type & how often? Walk in neighborhood 4 - 6 times per week if possible      Advanced Directives (Lawyer just beginning to work on these things)      Do you have a living will?      Do you have a DNR form?                    no              If not, do you want to discuss one?      Do you have signed POA/HPOA for forms?       Functional Status      Do you have difficulty bathing or dressing yourself? no      Do you have difficulty preparing food or eating? no      Do you have difficulty managing your medications? no      Do you have difficulty managing your finances? no      Do you have difficulty affording your medications? no   Social Determinants of Health   Financial Resource Strain: Not on file  Food Insecurity: Not on file  Transportation Needs: Not on file  Physical Activity: Not on file  Stress: Not on file  Social Connections: Not on file     Family History:  The patient's family history includes Alcohol abuse in her mother; Alzheimer's disease (age of onset: 67) in her father; Arthritis (age of onset: 40) in her sister; Cancer (age of onset: 31) in her maternal aunt; Dementia in her mother; Diabetes (age of onset: 37) in her mother; Hyperlipidemia in  her sister; Hyperlipidemia (age of onset: 88) in her sister; Hypertension in her mother; Kidney failure in her mother; Lung disease in her maternal grandfather and maternal grandmother; Osteoporosis in her mother; Other in her daughter and son; Stroke (age of onset: 73) in her mother.   ROS:   Please see the history of present illness.    ROS All other systems reviewed and are negative.   PHYSICAL EXAM:   VS:  BP 110/60   Pulse 72   Ht 5\' 6"  (1.676 m)   Wt 108 lb (49 kg)   SpO2 95%   BMI 17.43 kg/m   Physical Exam  GEN: Thin, in no acute distress  Neck: no JVD, carotid bruits, or masses Cardiac:RRR; no murmurs, rubs, or gallops  Respiratory:  clear to auscultation bilaterally, normal work of breathing GI: soft, nontender, nondistended, + BS Ext: without cyanosis, clubbing, or edema, Good distal pulses bilaterally Neuro:  Alert and Oriented x 3 Psych: euthymic mood, full affect  Wt Readings from Last 3 Encounters:  04/28/20 108 lb (49 kg)  04/27/20 106 lb (48.1 kg)  04/21/20 107 lb (48.5 kg)      Studies/Labs Reviewed:   EKG:  EKG is  not ordered today.    Recent Labs: 04/01/2020: ALT 15; TSH 3.76 04/16/2020: B Natriuretic Peptide 240.2 04/26/2020: BUN 15; Creatinine, Ser 0.57; Hemoglobin 13.1; Magnesium 2.3; Platelets 261; Potassium 3.8; Sodium 134   Lipid Panel    Component Value Date/Time   CHOL 192 03/19/2018 1005   TRIG 52 03/19/2018 1005   TRIG 46 01/29/2006 1132   HDL 88 03/19/2018 1005   CHOLHDL 2.2 03/19/2018 1005   VLDL 9.4 08/20/2012 1032   LDLCALC 91 03/19/2018 1005   LDLDIRECT 124.7 08/20/2012 1032    Additional studies/ records that were reviewed today include:  Echo 04/06/20 IMPRESSIONS     1. Left ventricular ejection fraction, by estimation, is 60 to 65%. The  left ventricle has normal function. The left ventricle has no regional  wall motion abnormalities. Left ventricular diastolic parameters were  normal.   2. Right ventricular systolic  function is normal. The right ventricular  size is normal.   3. The mitral valve is normal in structure. No evidence of mitral valve  regurgitation. No evidence of mitral stenosis.   4. The aortic valve is normal in structure. Aortic valve regurgitation is  not visualized. No aortic stenosis is present.   5. The inferior vena cava is normal in size with greater than 50%  respiratory variability, suggesting right atrial pressure of 3 mmHg.   FINDINGS   Left Ventricle: Left ventricular ejection fraction, by estimation, is 60  to 65%. The left ventricle has normal function. The left ventricle has no  regional wall motion abnormalities. The left ventricular internal cavity  size was normal in size. There is   no left ventricular hypertrophy. Left ventricular diastolic parameters  were normal. Indeterminate filling pressures.   Right Ventricle: The right ventricular size is normal. No increase in  right ventricular wall thickness. Right ventricular systolic function is  normal.   Left Atrium: Left atrial size was normal in size.   Right Atrium: Right atrial size was normal in size.   Pericardium: There is no evidence of pericardial effusion.   Mitral Valve: The mitral valve is normal in structure. Mild mitral annular  calcification. No evidence of mitral valve regurgitation. No evidence of  mitral valve stenosis.   Tricuspid Valve: The tricuspid valve is normal in structure. Tricuspid  valve regurgitation is not demonstrated. No evidence of tricuspid  stenosis.   Aortic Valve: The aortic valve is normal in structure. Aortic valve  regurgitation is not visualized. No aortic stenosis is present.   Pulmonic Valve: The pulmonic valve was normal in structure. Pulmonic valve  regurgitation is not visualized. No evidence of pulmonic stenosis.   Aorta: The aortic root is normal in size and structure.   Venous: The inferior vena cava is normal in size with greater than 50%  respiratory  variability, suggesting right atrial pressure of 3 mmHg.   IAS/Shunts: No atrial level shunt detected by color flow Doppler.   Coronary CTA 04/09/20 IMPRESSION: 1. Coronary calcium score of 0. This was 0 percentile for age and sex matched control.   2. Normal coronary origin with right dominance.   3. CAD-RADS 0. No evidence of CAD (0%). Consider non-atherosclerotic causes of chest pain.   4. 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     Electronically Signed   By: Ena Dawley   On: 04/09/2020 11:49     Risk Assessment/Calculations:    CHA2DS2-VASc Score = 3  This indicates a 3.2% annual risk of stroke. The patient's score  is based upon: CHF History: No HTN History: No Diabetes History: No Stroke History: No Vascular Disease History: No Age Score: 2 Gender Score: 1        ASSESSMENT:    1. Paroxysmal atrial fibrillation (HCC)      PLAN:  In order of problems listed above:  PAF developed rash on diltiazem and Eliquis, transition to metoprolol and Xarelto.  Recurrent atrial fibrillation started on flecainide 50 mg twice daily 04/26/2020.  Had coronary CTA 04/09/2020 calcium score 0 normal coronary arteries. Managed by Afib clinic-had EKG yest and allergic to pads so declines today. Had more Afib last night but converted with extra metoprolol. Keep f/u with Afib clinic. To be established with Dr.Pemberton.  Shared Decision Making/Informed Consent        Medication Adjustments/Labs and Tests Ordered: Current medicines are reviewed at length with the patient today.  Concerns regarding medicines are outlined above.  Medication changes, Labs and Tests ordered today are listed in the Patient Instructions below. Patient Instructions  Medication Instructions:  Your physician recommends that you continue on your current medications as directed. Please refer to the Current Medication list given to you today.  *If you need a refill on your cardiac medications  before your next appointment, please call your pharmacy*   Lab Work: None Today If you have labs (blood work) drawn today and your tests are completely normal, you will receive your results only by: Marland Kitchen MyChart Message (if you have MyChart) OR . A paper copy in the mail If you have any lab test that is abnormal or we need to change your treatment, we will call you to review the results.   Follow-Up: At [facility], you and your health needs are our priority.  As part of our continuing mission to provide you with exceptional heart care, we have created designated Provider Care Teams.  These Care Teams include your primary Cardiologist (physician) and Advanced Practice Providers (APPs -  Physician Assistants and Nurse Practitioners) who all work together to provide you with the care you need, when you need it.  Your next appointment:   4 month(s)  The format for your next appointment:   In Person  Provider:   Gwyndolyn Kaufman, MD      Signed, Alicia Barrios, PA-C  04/28/2020 2:26 PM    [address], [place], [place]  [address] Phone: [phone]; Fax: [phone]

## 2020-04-27 NOTE — Progress Notes (Signed)
Primary Care Physician: Lauree Chandler, NP Primary Cardiologist: Dr Meda Coffee Primary Electrophysiologist: none Referring Physician: [facility]   Alicia [name] is a 75 y.o. female with a history of chronic hyponatremia, carotid artery disease, meniere's disease, muscular dystrophy, deafness, atrial fibrillation who presents for consultation in the [facility].  The patient was initially diagnosed with atrial fibrillation 04/06/20 after presenting with symptoms of palpitations and SOB. Due to ongoing symptoms, she was seen by provider at [facility]. She was in sinus rhythm at that time by EKG and sent home.  That night she started to having worsening shortness of breath, palpitations, chest pressure, and fatigue.  Due to persistent symptoms she came to ER for further evaluation.  She was in sinus rhythm on initial evaluation however while in triage waiting for bed she started to having palpitation again.  She was found to have atrial fibrillation with rapid ventricular rate IV Cardizem and then started on drip. Symptoms improved and she eventually converted to sinus rhythm this morning.  Patient reports longstanding history of intermittent palpitation lasting for few minutes. She was started on diltiazem for rate control and Eliquis for stroke prevention. She developed a pruritic rash and theses medications were changed to Toprol and Xarelto. She has a CHADS2VASC score of 3. She was seen at the ED two additional times and flecainide was started 04/26/20.  Today, she denies symptoms of palpitations, chest pain, shortness of breath, orthopnea, PND, lower extremity edema, presyncope, syncope, snoring, daytime somnolence, bleeding, or neurologic sequela. The patient is tolerating medications without difficulties and is otherwise without complaint today.    Atrial Fibrillation Risk Factors:  she does not have symptoms or diagnosis of sleep apnea. she does not  have a history of rheumatic fever. she does not have a history of alcohol use.   she has a BMI of Body mass index is 17.11 kg/m.Marland Kitchen Filed Weights   04/27/20 1412  Weight: 48.1 kg    Family History  Problem Relation Age of Onset  . Alcohol abuse Mother   . Hypertension Mother   . Diabetes Mother 14  . Stroke Mother 71  . Dementia Mother   . Kidney failure Mother   . Osteoporosis Mother   . Alzheimer's disease Father 10  . Cancer Maternal Aunt 80       pancreatic  . Arthritis Sister 74       knee replacement  . Hyperlipidemia Sister   . Lung disease Maternal Grandmother   . Lung disease Maternal Grandfather   . Hyperlipidemia Sister 73  . Other Son        recent back injury  . Other Daughter        recent back injury; knee issues when playing tennis  . Heart disease Neg Hx   . Heart attack Neg Hx      Atrial Fibrillation Management history:  Previous antiarrhythmic drugs: flecainide Previous cardioversions: none Previous ablations: none CHADS2VASC score: 3 Anticoagulation history: Eliquis (had rash), Xarelto    Past Medical History:  Diagnosis Date  . Bronchitis   . 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 Chronic migraine without aura, intractable, without status migrainosus   . Diverticulosis 2010  . Endometriosis   . Low sodium levels   . Malnutrition ([place])   . MD (muscular dystrophy) ([place])   . Meniere's disease, bilateral   . Osteopenia    [facility]  . Peripheral neuropathy  Dr Erling Cruz  . Pneumonia   . Sensorineural hearing loss, bilateral   . Vertigo 2019   Past Surgical History:  Procedure Laterality Date  . ABDOMINAL HYSTERECTOMY  1980 or 1981   for Endometriosis  . 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 BREAST ENHANCEMENT SURGERY Bilateral 1979  . COLONOSCOPY  01/2012   Tics; Dr Teena Irani  . ESI      X 3 in 2003 & 02/17/2011 for L4-S1 symptoms  . LUMBAR LAMINECTOMY  12/2012   [facility] , [place]  . TONSILLECTOMY   1950 or 1951  . TUBAL LIGATION    . vocal cords stripped  1970    Current Outpatient Medications  Medication Sig Dispense Refill  . acetaminophen (TYLENOL) 500 MG tablet Take 500 mg by mouth at bedtime.    . AZELAIC ACID EX Apply 1 application topically 2 (two) times daily. Right after metronidazole cream    . baclofen (LIORESAL) 10 MG tablet Take 10 mg by mouth at bedtime.    . Biotin 5000 MCG TABS Take 5,000 mcg by mouth daily.     . butalbital-acetaminophen-caffeine (FIORICET, ESGIC) 50-325-40 MG tablet Take 1 tablet by mouth every 6 (six) hours as needed for migraine (for severe pain).    . CALCIUM PO Take 1,200 mg by mouth 2 (two) times daily.    . Carboxymethylcellulose Sodium 0.25 % SOLN Apply 1 drop to eye 3 (three) times daily.    . Cholecalciferol (VITAMIN D3) LIQD Take 1 drop by mouth See admin instructions. Takes 1 drop by mouth every day except Sat and Sun    . Evening Primrose Oil 1000 MG CAPS Take 1,000 mg by mouth every evening.    Marland Kitchen FEMRING 0.05 MG/24HR RING Place 1 each vaginally every 3 (three) months.    . flecainide (TAMBOCOR) 50 MG tablet Take 1 tablet (50 mg total) by mouth 2 (two) times daily. 60 tablet 1  . gabapentin (NEURONTIN) 100 MG capsule Take 200 mg by mouth at bedtime.     Marland Kitchen lactase (LACTAID) 3000 units tablet Take 3,000 Units by mouth daily as needed (when eating dairy products).    . Lutein 20 MG TABS Take 20 mg by mouth daily.     Marland Kitchen MAGNESIUM CITRATE PO Take 300 mg by mouth at bedtime.    . meclizine (ANTIVERT) 12.5 MG tablet Take 1 tablet (12.5 mg total) by mouth 3 (three) times daily as needed for dizziness. 30 tablet 0  . methocarbamol (ROBAXIN) 750 MG tablet Take 750 mg by mouth every 6 (six) hours.    . metoprolol succinate (TOPROL-XL) 25 MG 24 hr tablet Take 0.5 tablets (12.5 mg total) by mouth in the morning and at bedtime. 30 tablet 1  . metroNIDAZOLE (METROGEL) 0.75 % gel Apply 1 application topically 2 (two) times daily. Apply to face for rosacea     . Multiple Vitamin (MULTIVITAMIN WITH MINERALS) TABS tablet Take 1 tablet by mouth daily.    . Multiple Vitamins-Minerals (PRESERVISION AREDS 2) CAPS Take 1 capsule by mouth 2 (two) times daily.    . Omega-3 1000 MG CAPS Take 1,000 mg by mouth 2 (two) times daily.    . ondansetron (ZOFRAN-ODT) 4 MG disintegrating tablet Take 1 tablet (4 mg total) by mouth every 6 (six) hours as needed for nausea or vomiting. 30 tablet 0  . rivaroxaban (XARELTO) 20 MG TABS tablet Take 1 tablet (20 mg total) by mouth daily with supper. 30 tablet  0  . sodium chloride 1 g tablet Take 1 tablet (1 g total) by mouth 2 (two) times daily with a meal. 60 tablet 3  . SUMAtriptan (IMITREX) 50 MG tablet Take 1 tablet (50 mg total) by mouth every 2 (two) hours as needed for migraine. May repeat in 2 hours if headache persists or recurs. 30 tablet 0   No current facility-administered medications for this encounter.    Allergies  Allergen Reactions  . Biaxin [Clarithromycin] Anaphylaxis  . Sulfonamide Derivatives Itching and Rash  . Tequin [Gatifloxacin] Anaphylaxis  . Demerol Nausea And Vomiting  . Lactose Intolerance (Gi) Other (See Comments)    Abdominal pain, gas, bloating  . Nsaids Other (See Comments)    Reaction:  GI upset/heartburn   . Risedronate Sodium Nausea And Vomiting and Other (See Comments)     GI upset/heartburn   . Rofecoxib Nausea And Vomiting and Other (See Comments)    GI upset/heartburn   . Alendronate Sodium Nausea And Vomiting  . Aspirin Other (See Comments)    Abdominal pain   . Cardizem [Diltiazem Hcl] Hives  . Eliquis [Apixaban] Hives  . Fish Allergy Nausea And Vomiting    Can take fish oil  . Benadryl [Diphenhydramine Hcl] Palpitations    Social History   Socioeconomic History  . Marital status: Married    Spouse name: Not on file  . Number of children: Not on file  . Years of education: Not on file  . Highest education level: Not on file  Occupational History  . Not on  file  Tobacco Use  . Smoking status: Never Smoker  . Smokeless tobacco: Never Used  Vaping Use  . Vaping Use: Never used  Substance and Sexual Activity  . Alcohol use: Not Currently  . Drug use: Never  . Sexual activity: Not Currently  Other Topics Concern  . Not on file  Social History Narrative   Social History      Diet? Trying to gain weight      Do you drink/eat things with caffeine? 1 mug hot tea with breakfast per day      Marital status?  Married 21 years                                  What year were you married? 1970      Do you live in a house, apartment, assisted living, condo, trailer, etc.? House (46 years)      Is it one or more stories? 2 stories and walk up finished attic      How many persons live in your home? 2      Do you have any pets in your home? (please list) none      Highest level of education completed? 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     yes                                 Type & how often? Walk in neighborhood 4 - 6 times per week if possible      Advanced Directives (Lawyer just beginning to work on these things)      Do you have a living will?  Do you have a DNR form?                    no              If not, do you want to discuss one?      Do you have signed POA/HPOA for forms?       Functional Status      Do you have difficulty bathing or dressing yourself? no      Do you have difficulty preparing food or eating? no      Do you have difficulty managing your medications? no      Do you have difficulty managing your finances? no      Do you have difficulty affording your medications? no   Social Determinants of Health   Financial Resource Strain: Not on file  Food Insecurity: Not on file  Transportation Needs: Not on file  Physical Activity: Not on file  Stress: Not on file  Social Connections: Not  on file  Intimate Partner Violence: Not on file     ROS- All systems are reviewed and negative except as per the HPI above.  Physical Exam: Vitals:   04/27/20 1412  BP: 122/78  Pulse: 72  Weight: 48.1 kg  Height: 5\' 6"  (1.676 m)    GEN- The patient is well appearing elderly female, alert and oriented x 3 today.   Head- normocephalic, atraumatic Eyes-  Sclera clear, conjunctiva pink Ears- hearing intact Oropharynx- clear Neck- supple  Lungs- Clear to ausculation bilaterally, normal work of breathing Heart- Regular rate and rhythm, no murmurs, rubs or gallops  GI- soft, NT, ND, + BS Extremities- no clubbing, cyanosis, or edema MS- no significant deformity or atrophy Skin- no rash or lesion Psych- euthymic mood, full affect Neuro- strength and sensation are intact  Wt Readings from Last 3 Encounters:  04/27/20 48.1 kg  04/21/20 48.5 kg  04/11/20 47 kg    EKG today demonstrates  SR Vent. rate 72 BPM PR interval 166 ms QRS duration 98 ms QT/QTc 372/407 ms  Echo 04/06/20 demonstrated  1. Left ventricular ejection fraction, by estimation, is 60 to 65%. The  left ventricle has normal function. The left ventricle has no regional  wall motion abnormalities. Left ventricular diastolic parameters were  normal.  2. Right ventricular systolic function is normal. The right ventricular  size is normal.  3. The mitral valve is normal in structure. No evidence of mitral valve  regurgitation. No evidence of mitral stenosis.  4. The aortic valve is normal in structure. Aortic valve regurgitation is  not visualized. No aortic stenosis is present.  5. The inferior vena cava is normal in size with greater than 50%  respiratory variability, suggesting right atrial pressure of 3 mmHg.  Epic records are reviewed at length today  CHA2DS2-VASc Score = 3  The patient's score is based upon: CHF History: No HTN History: No Diabetes History: No Stroke History: No Vascular Disease  History: No Age Score: 2 Gender Score: 1      ASSESSMENT AND PLAN: 1. Paroxysmal Atrial Fibrillation (ICD10:  I48.0) The patient's CHA2DS2-VASc score is 3, indicating a 3.2% annual risk of stroke.   Patient in [place] since starting flecainide. If she has recurrence, will increase dose. Continue flecainide 50 mg BID Continue Toprol 12.5 mg BID Continue Xarelto 20 mg daily  2. Secondary Hypercoagulable State (ICD10:  D68.69) The patient is at significant risk for stroke/thromboembolism based upon her CHA2DS2-VASc  Score of 3.  Continue Rivaroxaban (Xarelto).    Follow up with Ermalinda Barrios as scheduled. Offered to move appointment but patient wanted to keep as scheduled. AF clinic in 2 months.    [facility] [address] [place], [place] [address] [phone] 04/27/2020 2:27 PM

## 2020-04-28 ENCOUNTER — Telehealth: Payer: Self-pay

## 2020-04-28 ENCOUNTER — Encounter: Payer: Self-pay | Admitting: Physician Assistant

## 2020-04-28 ENCOUNTER — Telehealth (HOSPITAL_COMMUNITY): Payer: Self-pay | Admitting: Physician Assistant

## 2020-04-28 ENCOUNTER — Ambulatory Visit (INDEPENDENT_AMBULATORY_CARE_PROVIDER_SITE_OTHER): Payer: Medicare Other | Admitting: Physician Assistant

## 2020-04-28 VITALS — BP 110/60 | HR 72 | Ht 66.0 in | Wt 108.0 lb

## 2020-04-28 DIAGNOSIS — I480 Paroxysmal atrial fibrillation: Secondary | ICD-10-CM

## 2020-04-28 NOTE — Telephone Encounter (Signed)
Called and spoke to patient regarding her afib episode yesterday. We discussed that it takes flecainide 3-4 days to reach peak efficacy and not to increase right now. We did increase her metoprolol to 12.5 mg BID. She will call AF clinic back on Friday with update. May increase flecainide at that time if she has another interim episode. She was thankful for the call. F/u as scheduled.

## 2020-04-28 NOTE — Patient Instructions (Signed)
Medication Instructions:  Your physician recommends that you continue on your current medications as directed. Please refer to the Current Medication list given to you today.  *If you need a refill on your cardiac medications before your next appointment, please call your pharmacy*   Lab Work: None Today If you have labs (blood work) drawn today and your tests are completely normal, you will receive your results only by: Marland Kitchen MyChart Message (if you have MyChart) OR . A paper copy in the mail If you have any lab test that is abnormal or we need to change your treatment, we will call you to review the results.   Follow-Up: At [facility], you and your health needs are our priority.  As part of our continuing mission to provide you with exceptional heart care, we have created designated Provider Care Teams.  These Care Teams include your primary Cardiologist (physician) and Advanced Practice Providers (APPs -  Physician Assistants and Nurse Practitioners) who all work together to provide you with the care you need, when you need it.  Your next appointment:   4 month(s)  The format for your next appointment:   In Person  Provider:   Gwyndolyn Kaufman, MD

## 2020-04-28 NOTE — Telephone Encounter (Signed)
[facility] called and given verbal orders.

## 2020-04-28 NOTE — Telephone Encounter (Signed)
Physical Therapist Shanon Brow from [facility] called and states that patient has been having issues with atrial fibrillation. He states that he wanted to know if they could add Nurse to home health care to help with this issues, as well as medications. Message routed to PCP Dewaine Oats, Carlos American, NP for verbal orders. Please Advise.

## 2020-04-28 NOTE — Telephone Encounter (Signed)
Okay to add home health nursing

## 2020-04-29 ENCOUNTER — Ambulatory Visit (INDEPENDENT_AMBULATORY_CARE_PROVIDER_SITE_OTHER): Payer: Medicare Other | Admitting: Family

## 2020-04-29 ENCOUNTER — Other Ambulatory Visit: Payer: Self-pay

## 2020-04-29 ENCOUNTER — Encounter: Payer: Self-pay | Admitting: Family

## 2020-04-29 VITALS — BP 110/80 | HR 71 | Temp 97.3°F | Resp 16 | Ht 66.0 in | Wt 108.6 lb

## 2020-04-29 DIAGNOSIS — B0230 Zoster ocular disease, unspecified: Secondary | ICD-10-CM

## 2020-04-29 MED ORDER — VALACYCLOVIR HCL 1 G PO TABS: 1000.0000 mg | ORAL_TABLET | Freq: Two times a day (BID) | ORAL | 0 refills | Status: DC

## 2020-04-29 NOTE — Progress Notes (Signed)
Provider: Kaysin Brock FNP-C  Lauree Chandler, NP  Patient Care Team: Lauree Chandler, NP as PCP - General (Geriatric Medicine) Deliah Goody, PA-C as Consulting Physician (Physician Assistant) Danella Sensing, MD as Consulting Physician (Dermatology) Everett Graff, MD as Consulting Physician (Obstetrics and Gynecology) Vicie Mutters, MD as Consulting Physician (Otolaryngology) Marshell Garfinkel, MD as Consulting Physician (Pulmonary Disease) Gayla Medicus, MD as Referring Physician (Neurology)  Extended Emergency Contact Information Primary Emergency Contact: Park Pope Address: [address], [place] Phone: [phone] Relation: Spouse Secondary Emergency Contact: [place] Mobile Phone: [phone] Relation: Daughter  Code Status:  Full Code  Goals of care: Advanced Directive information Advanced Directives 04/29/2020  Does Patient Have a Medical Advance Directive? Yes  Type of Paramedic of [place];Living will  Does patient want to make changes to medical advance directive? No - Patient declined  Copy of [place] in Chart? -  Would patient like information on creating a medical advance directive? -     Chief Complaint  Patient presents with  . Acute Visit    Complains of possible shingles in eye.    HPI:  Pt is a 75 y.o. female seen today for an acute visit for evaluation of possible shingles in the eye.symptoms started yesterday with raised rash in little clusters below right eyes which seems to be getting worse.she woke up with constant discomfort shooting in the eye.States tends to have redness in the eye that has not worsen.No drainage noted.Rash described as painful when touched.Has ad no fever,chills.does not recall any bite or injuries to site. She request provider to contact her [facility] clinic provider Dr.Clint R.Fenteon at [phone] if any medication has  to be ordered will need approval to make sure there is no interaction.      Past Medical History:  Diagnosis Date  . Bronchitis   . 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 Chronic migraine without aura, intractable, without status migrainosus   . Diverticulosis 2010  . Endometriosis   . Low sodium levels   . Malnutrition ([place])   . MD (muscular dystrophy) ([place])   . Meniere's disease, bilateral   . [facility]    [facility]  . Peripheral neuropathy    Dr Erling Cruz  . Pneumonia   . Sensorineural hearing loss, bilateral   . Vertigo 2019   Past Surgical History:  Procedure Laterality Date  . ABDOMINAL HYSTERECTOMY  1980 or 1981   for Endometriosis  . 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 BREAST ENHANCEMENT SURGERY Bilateral 1979  . COLONOSCOPY  01/2012   Tics; Dr Teena Irani  . ESI      X 3 in 2003 & 02/17/2011 for L4-S1 symptoms  . LUMBAR LAMINECTOMY  12/2012   [facility] , [place]  . TONSILLECTOMY  1950 or 1951  . TUBAL LIGATION    . vocal cords stripped  1970    Allergies  Allergen Reactions  . Biaxin [Clarithromycin] Anaphylaxis  . Sulfonamide Derivatives Itching and Rash  . Tequin [Gatifloxacin] Anaphylaxis  . Demerol Nausea And Vomiting  . Lactose Intolerance (Gi) Other (See Comments)    Abdominal pain, gas, bloating  . Nsaids Other (See Comments)    Reaction:  GI upset/heartburn   . Risedronate Sodium Nausea And Vomiting and Other (See Comments)     GI upset/heartburn   .  Rofecoxib Nausea And Vomiting and Other (See Comments)    GI upset/heartburn   . Alendronate Sodium Nausea And Vomiting  . Aspirin Other (See Comments)    Abdominal pain   . Cardizem [Diltiazem Hcl] Hives  . Eliquis [Apixaban] Hives  . Fish Allergy Nausea And Vomiting    Can take fish oil  . Benadryl [Diphenhydramine Hcl] Palpitations  . Other Rash    EKG LEADS    Outpatient Encounter Medications as of 04/29/2020  Medication Sig  . acetaminophen (TYLENOL) 500 MG  tablet Take 500 mg by mouth at bedtime.  . AZELAIC ACID EX Apply 1 application topically 2 (two) times daily. Right after metronidazole cream  . baclofen (LIORESAL) 10 MG tablet Take 10 mg by mouth at bedtime.  . Biotin 5000 MCG TABS Take 5,000 mcg by mouth daily.   . butalbital-acetaminophen-caffeine (FIORICET, ESGIC) 50-325-40 MG tablet Take 1 tablet by mouth every 6 (six) hours as needed for migraine (for severe pain).  . CALCIUM PO Take 1,200 mg by mouth 2 (two) times daily.  . Carboxymethylcellulose Sodium 0.25 % SOLN Apply 1 drop to eye 3 (three) times daily.  . Cholecalciferol (VITAMIN D3) LIQD Take 1 drop by mouth See admin instructions. Takes 1 drop by mouth every day except Sat and Sun  . Evening Primrose Oil 1000 MG CAPS Take 1,000 mg by mouth every evening.  Marland Kitchen FEMRING 0.05 MG/24HR RING Place 1 each vaginally every 3 (three) months.  . flecainide (TAMBOCOR) 50 MG tablet Take 1 tablet (50 mg total) by mouth 2 (two) times daily.  Marland Kitchen gabapentin (NEURONTIN) 100 MG capsule Take 200 mg by mouth at bedtime.   Marland Kitchen lactase (LACTAID) 3000 units tablet Take 3,000 Units by mouth daily as needed (when eating dairy products).  . Lutein 20 MG TABS Take 20 mg by mouth daily.   Marland Kitchen MAGNESIUM CITRATE PO Take 300 mg by mouth at bedtime.  . meclizine (ANTIVERT) 12.5 MG tablet Take 1 tablet (12.5 mg total) by mouth 3 (three) times daily as needed for dizziness.  . methocarbamol (ROBAXIN) 750 MG tablet Take 750 mg by mouth every 6 (six) hours.  . metoprolol succinate (TOPROL-XL) 25 MG 24 hr tablet Take 0.5 tablets (12.5 mg total) by mouth in the morning and at bedtime.  . metroNIDAZOLE (METROGEL) 0.75 % gel Apply 1 application topically 2 (two) times daily. Apply to face for rosacea  . Multiple Vitamin (MULTIVITAMIN WITH MINERALS) TABS tablet Take 1 tablet by mouth daily.  . Multiple Vitamins-Minerals (PRESERVISION AREDS 2) CAPS Take 1 capsule by mouth 2 (two) times daily.  . Omega-3 1000 MG CAPS Take 1,000 mg  by mouth 2 (two) times daily.  . ondansetron (ZOFRAN-ODT) 4 MG disintegrating tablet Take 1 tablet (4 mg total) by mouth every 6 (six) hours as needed for nausea or vomiting.  . rivaroxaban (XARELTO) 20 MG TABS tablet Take 1 tablet (20 mg total) by mouth daily with supper.  . sodium chloride 1 g tablet Take 1 tablet (1 g total) by mouth 2 (two) times daily with a meal.  . SUMAtriptan (IMITREX) 50 MG tablet Take 1 tablet (50 mg total) by mouth every 2 (two) hours as needed for migraine. May repeat in 2 hours if headache persists or recurs.  . valACYclovir (VALTREX) 1000 MG tablet Take 1 tablet (1,000 mg total) by mouth 2 (two) times daily.  . [DISCONTINUED] apixaban (ELIQUIS) 5 MG TABS tablet Take 1 tablet (5 mg total) by mouth 2 (two) times daily.  . [  DISCONTINUED] diltiazem (CARDIZEM CD) 120 MG 24 hr capsule Take 1 capsule (120 mg total) by mouth daily.   No facility-administered encounter medications on file as of 04/29/2020.    Review of Systems  Constitutional: Negative for appetite change, chills, fatigue and fever.  HENT: Negative for congestion, rhinorrhea, sinus pressure, sinus pain, sneezing and sore throat.   Eyes: Negative for pain, redness and itching.  Respiratory: Negative for cough, chest tightness, shortness of breath and wheezing.   Skin: Positive for rash. Negative for color change and pallor.  Neurological: Negative for dizziness, weakness, light-headedness, numbness and headaches.    Immunization History  Administered Date(s) Administered  . Fluad Quad(high Dose 65+) 02/18/2019  . Influenza Whole 01/27/2008  . Influenza, High Dose Seasonal PF 12/05/2013, 01/25/2018  . PFIZER(Purple Top)SARS-COV-2 Vaccination 04/18/2019  . Pneumococcal Conjugate-13 11/19/2013  . Pneumococcal Polysaccharide-23 02/14/2018  . Td 01/03/2005  . Tdap 01/03/2017  . Zoster 02/23/2009   Pertinent  Health Maintenance Due  Topic Date Due  . INFLUENZA VACCINE  10/05/2019  . COLONOSCOPY (Pts  45-27yrs Insurance coverage will need to be confirmed)  11/20/2019  . DEXA SCAN  Completed  . PNA vac Low Risk Adult  Completed   Fall Risk  04/29/2020 04/21/2020 04/05/2020 04/01/2020 03/29/2020  Falls in the past year? 0 0 0 0 0  Number falls in past yr: 0 0 0 0 0  Injury with Fall? 0 0 0 0 0  Comment - - - - -  Risk for fall due to : - - - - -   Functional Status Survey:    Vitals:   04/29/20 1307  BP: 110/80  Pulse: 71  Resp: 16  Temp: (!) 97.3 F (36.3 C)  SpO2: 96%  Weight: 108 lb 9.6 oz (49.3 kg)  Height: 5\' 6"  (1.676 m)   Body mass index is 17.53 kg/m. Physical Exam Vitals reviewed.  Constitutional:      General: She is not in acute distress.    Appearance: She is not ill-appearing.  HENT:     Head: Normocephalic.     Nose: Nose normal. No congestion or rhinorrhea.     Mouth/Throat:     Mouth: Mucous membranes are moist.     Pharynx: Oropharynx is clear. No oropharyngeal exudate or posterior oropharyngeal erythema.  Eyes:     General: No scleral icterus.       Right eye: No discharge.        Left eye: No discharge.     Conjunctiva/sclera: Conjunctivae normal.     Pupils: Pupils are equal, round, and reactive to light.  Cardiovascular:     Rate and Rhythm: Normal rate and regular rhythm.     Pulses: Normal pulses.     Heart sounds: No murmur heard. No friction rub. Gallop present.   Pulmonary:     Effort: Pulmonary effort is normal. No respiratory distress.     Breath sounds: Normal breath sounds. No wheezing, rhonchi or rales.  Chest:     Chest wall: No tenderness.  Skin:    General: Skin is warm and dry.     Coloration: Skin is not pale.     Findings: Erythema and rash present.       Neurological:     Mental Status: She is alert.     Labs reviewed: Recent Labs    04/06/20 0703 04/08/20 0707 04/16/20 1125 04/21/20 1519 04/26/20 0112  NA 134*   < > 134* 132* 134*  K 4.6   < >  4.0 5.0 3.8  CL 101   < > 99 94* 103  CO2 24   < > 24 30 24    GLUCOSE 98   < > 138* 93 114*  BUN 8   < > 14 16 15   CREATININE 0.48   < > 0.62 0.52* 0.57  CALCIUM 9.0   < > 9.1 9.7 9.2  MG 2.2  --  2.4  --  2.3   < > = values in this interval not displayed.   Recent Labs    04/01/20 1352  AST 21  ALT 15  BILITOT 0.5  PROT 7.2   Recent Labs    04/01/20 1352 04/05/20 1726 04/06/20 0703 04/16/20 1125 04/26/20 0112  WBC 7.7   < > 6.0 7.1 6.6  NEUTROABS 6,483  --   --  5.6  --   HGB 14.6   < > 13.8 14.6 13.1  HCT 43.8   < > 39.7 45.5 39.6  MCV 91.4   < > 89.8 92.1 91.2  PLT 262   < > 228 295 261   < > = values in this interval not displayed.   Lab Results  Component Value Date   TSH 3.76 04/01/2020   Lab Results  Component Value Date   HGBA1C 5.6 03/03/2011   Lab Results  Component Value Date   CHOL 192 03/19/2018   HDL 88 03/19/2018   LDLCALC 91 03/19/2018   LDLDIRECT 124.7 08/20/2012   TRIG 52 03/19/2018   CHOLHDL 2.2 03/19/2018    Significant Diagnostic Results in last 30 days:  DG Chest 2 View  Result Date: 04/05/2020 CLINICAL DATA:  Left-sided chest pain, cough, and fatigue for 1 week. EXAM: CHEST - 2 VIEW COMPARISON:  12/13/2018 FINDINGS: The heart size and mediastinal contours are within normal limits. Aortic atherosclerotic calcification noted. Bilateral upper lobe pleural-parenchymal scarring remains stable. Pulmonary hyperinflation is again noted. No evidence of acute infiltrate or edema. No evidence of pleural effusion. Bilateral breast implants again noted. IMPRESSION: COPD and bilateral upper lobe pleural-parenchymal scarring. No active lung disease. Electronically Signed   By: Marlaine Hind M.D.   On: 04/05/2020 18:26   CT ANGIO CHEST PE W OR WO CONTRAST  Result Date: 04/08/2020 CLINICAL DATA:  Shortness of breath, chest heaviness. EXAM: CT ANGIOGRAPHY CHEST WITH CONTRAST TECHNIQUE: Multidetector CT imaging of the chest was performed using the standard protocol during bolus administration of intravenous contrast.  Multiplanar CT image reconstructions and MIPs were obtained to evaluate the vascular anatomy. CONTRAST:  42mL OMNIPAQUE IOHEXOL 350 MG/ML SOLN COMPARISON:  None. FINDINGS: Cardiovascular: Satisfactory opacification of the pulmonary arteries to the segmental level. No evidence of pulmonary embolism. Normal heart size. No pericardial effusion. Atherosclerosis of thoracic aorta is noted without aneurysm formation. Mediastinum/Nodes: No enlarged mediastinal, hilar, or axillary lymph nodes. Thyroid gland, trachea, and esophagus demonstrate no significant findings. Lungs/Pleura: No pneumothorax or pleural effusion is noted. Mild to moderate bilateral apical scarring is noted. Upper Abdomen: No acute abnormality. Musculoskeletal: No chest wall abnormality. No acute or significant osseous findings. Review of the MIP images confirms the above findings. IMPRESSION: 1. No definite evidence of pulmonary embolus. 2. Mild to moderate bilateral apical scarring is noted. 3. Aortic atherosclerosis. Aortic Atherosclerosis (ICD10-I70.0). Electronically Signed   By: Marijo Conception M.D.   On: 04/08/2020 12:09   CT CORONARY MORPH W/CTA COR W/SCORE W/CA W/CM &/OR WO/CM  Addendum Date: 04/09/2020   ADDENDUM REPORT: 04/09/2020 11:49 CLINICAL DATA:  75 year old female with  shortness of breath and palpitation. EXAM: Cardiac/Coronary  CTA TECHNIQUE: The patient was scanned on a [employer]. FINDINGS: A 100 kV prospective scan was triggered in the descending thoracic aorta at 111 HU's. Axial non-contrast 3 mm slices were carried out through the heart. The data set was analyzed on a dedicated work station and scored using the [place]. Gantry rotation speed was 250 msecs and collimation was .6 mm. 10 mg of PO Ivabradine and 0.8 mg of sl NTG were given. The 3D data set was reconstructed in 5% intervals of the 67-82 % of the R-R cycle. Diastolic phases were analyzed on a dedicated work station using MPR, MIP and VRT modes. The  patient received 80 cc of contrast. Aorta: Normal size. Mild diffuse atherosclerotic plaque with diffuse calcifications. No dissection. Aortic Valve:  Trileaflet.  Mild cusp calcifications. Coronary Arteries:  Normal coronary origin.  Right dominance. RCA is a large dominant artery that gives rise to PDA and PLA. There is no plaque. Left main is a large artery that gives rise to LAD and LCX arteries. Let main has no plaque. LAD is a large tortuous vessel that gives rise to two diagonal arteries and has no plaque. D1,2 are medium size arteries with no plaque. LCX is a very small non-dominant artery that has no plaque. Other findings: Normal pulmonary vein drainage into the left atrium. Normal left atrial appendage without a thrombus. 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 IMPRESSION: 1. Coronary calcium score of 0. This was 0 percentile for age and sex matched control. 2. Normal coronary origin with right dominance. 3. CAD-RADS 0. No evidence of CAD (0%). Consider non-atherosclerotic causes of chest pain. 4. 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 Electronically Signed   By: Ena Dawley   On: 04/09/2020 11:49   Result Date: 04/09/2020 EXAM: OVER-READ INTERPRETATION  CT CHEST The following report is an over-read performed by radiologist Dr. Vinnie Langton of [facility], [place] on 04/09/2020. This over-read does not include interpretation of cardiac or coronary anatomy or pathology. The coronary calcium score/coronary CTA interpretation by the cardiologist is attached. COMPARISON:  None. FINDINGS: Aortic atherosclerosis. Within the visualized portions of the thorax there are no suspicious appearing pulmonary nodules or masses, there is no acute consolidative airspace disease, no pleural effusions, no pneumothorax and no lymphadenopathy. Visualized portions of the upper abdomen are unremarkable. There are no aggressive appearing lytic or blastic lesions noted in the visualized portions of  the skeleton. Bilateral breast implants with dense capsular calcifications are incidentally noted. IMPRESSION: 1.  Aortic Atherosclerosis (ICD10-I70.0). Electronically Signed: By: Vinnie Langton M.D. On: 04/09/2020 11:02   DG Chest Portable 1 View  Result Date: 04/16/2020 CLINICAL DATA:  Shortness of breath and atrial fibrillation. EXAM: PORTABLE CHEST 1 VIEW COMPARISON:  CT a chest dated April 08, 2020. Chest x-ray dated April 05, 2020. FINDINGS: The heart size and mediastinal contours are within normal limits. Normal pulmonary vascularity. The lungs remain hyperinflated with biapical pleuroparenchymal scarring. No focal consolidation, pleural effusion, or pneumothorax. No acute osseous abnormality. IMPRESSION: 1. COPD. No active disease. Electronically Signed   By: Titus Dubin M.D.   On: 04/16/2020 12:14   ECHOCARDIOGRAM COMPLETE  Result Date: 04/06/2020    ECHOCARDIOGRAM REPORT   Patient Name:   Alicia [name] Date of Exam: 04/06/2020 Medical Rec #:  [id number]         Height:       66.0 in Accession #:    [id number]  Weight:       105.0 lb Date of Birth:  [birth year]        BSA:          1.521 m Patient Age:    53 years          BP:           113/65 mmHg Patient Gender: F                 HR:           82 bpm. Exam Location:  Inpatient Procedure: 2D Echo, Color Doppler and Cardiac Doppler Indications:    R07.9* Chest pain, unspecified  History:        Patient has prior history of Echocardiogram examinations, most                 recent 07/11/2012. Arrythmias:Atrial Fibrillation; Risk                 Factors:Dyslipidemia.  Sonographer:    Raquel Sarna Senior RDCS Referring Phys: [id number] [place]  Sonographer Comments: Very poor echo windows due to rib shadowing and implants. Patient unable to tolerate any probe pressure. IMPRESSIONS  1. Left ventricular ejection fraction, by estimation, is 60 to 65%. The left ventricle has normal function. The left ventricle has no regional wall motion  abnormalities. Left ventricular diastolic parameters were normal.  2. Right ventricular systolic function is normal. The right ventricular size is normal.  3. The mitral valve is normal in structure. No evidence of mitral valve regurgitation. No evidence of mitral stenosis.  4. The aortic valve is normal in structure. Aortic valve regurgitation is not visualized. No aortic stenosis is present.  5. The inferior vena cava is normal in size with greater than 50% respiratory variability, suggesting right atrial pressure of 3 mmHg. FINDINGS  Left Ventricle: Left ventricular ejection fraction, by estimation, is 60 to 65%. The left ventricle has normal function. The left ventricle has no regional wall motion abnormalities. The left ventricular internal cavity size was normal in size. There is  no left ventricular hypertrophy. Left ventricular diastolic parameters were normal. Indeterminate filling pressures. Right Ventricle: The right ventricular size is normal. No increase in right ventricular wall thickness. Right ventricular systolic function is normal. Left Atrium: Left atrial size was normal in size. Right Atrium: Right atrial size was normal in size. Pericardium: There is no evidence of pericardial effusion. Mitral Valve: The mitral valve is normal in structure. Mild mitral annular calcification. No evidence of mitral valve regurgitation. No evidence of mitral valve stenosis. Tricuspid Valve: The tricuspid valve is normal in structure. Tricuspid valve regurgitation is not demonstrated. No evidence of tricuspid stenosis. Aortic Valve: The aortic valve is normal in structure. Aortic valve regurgitation is not visualized. No aortic stenosis is present. Pulmonic Valve: The pulmonic valve was normal in structure. Pulmonic valve regurgitation is not visualized. No evidence of pulmonic stenosis. Aorta: The aortic root is normal in size and structure. Venous: The inferior vena cava is normal in size with greater than 50%  respiratory variability, suggesting right atrial pressure of 3 mmHg. IAS/Shunts: No atrial level shunt detected by color flow Doppler.  LEFT VENTRICLE PLAX 2D LVIDd:         3.80 cm  Diastology LVIDs:         2.60 cm  LV e' medial:    6.64 cm/s LV PW:         0.80 cm  LV E/e' medial:  14.2  LV IVS:        0.80 cm  LV e' lateral:   8.16 cm/s LVOT diam:     2.00 cm  LV E/e' lateral: 11.6 LV SV:         66 LV SV Index:   43 LVOT Area:     3.14 cm  RIGHT VENTRICLE RV S prime:     13.50 cm/s TAPSE (M-mode): 2.6 cm LEFT ATRIUM           Index       RIGHT ATRIUM           Index LA diam:      2.60 cm 1.71 cm/m  RA Area:     14.90 cm LA Vol (A4C): 39.2 ml 25.78 ml/m RA Volume:   38.50 ml  25.32 ml/m  AORTIC VALVE LVOT Vmax:   88.80 cm/s LVOT Vmean:  62.300 cm/s LVOT VTI:    0.209 m  AORTA Ao Root diam: 2.30 cm MITRAL VALVE MV Area (PHT): 4.60 cm    SHUNTS MV Decel Time: 165 msec    Systemic VTI:  0.21 m MV E velocity: 94.30 cm/s  Systemic Diam: 2.00 cm MV A velocity: 80.60 cm/s MV E/A ratio:  1.17 Mihai Croitoru MD Electronically signed by Sanda Klein MD Signature Date/Time: 04/06/2020/3:47:55 PM    Final     Assessment/Plan 1. Herpes zoster with ophthalmic complication, unspecified herpes zoster eye disease - vesicular raised rash with erythema slight tender to touch suspicious for shingles with eye discomfort. - Advised to start on Valacyclovir as below.No interaction with Flecainide noted.contacted Dr.Clint R.Fenteon at [phone] per patient's request to verify if okay to start on valacyclovir.Dr.Fenteon agrees no interaction with her Afib medication okay to initiate valacyclovir.patient notified.Wanted provider to call her pharmacy to make sure they have med prior to sending.CMA verified med availability at her pharmacy.Script send. - Urgent referral send to her Ophthalmologist Dr. Marica Otter tele# [phone]    - valACYclovir (VALTREX) 1000 MG tablet; Take 1 tablet (1,000 mg total) by mouth 2 (two)  times daily.  Dispense: 20 tablet; Refill: 0 - Ambulatory referral to Ophthalmology   Family/ staff Communication: Reviewed plan of care with patient verbalized understanding.  Labs/tests ordered: None   Next Appointment: As needed if symptoms worsen or fail to improve   Sandrea Hughs, NP

## 2020-04-29 NOTE — Patient Instructions (Addendum)
- Take Valacyclovir 1000 mg tablet one by mouth twice daily x 10 days Dr.Clint R.Fenteon PA  Notified at [phone]  that your are starting on valacyclovir said okay for you to take medication does not interact with your heart medication Flecainide. - urgent  Referral ordered for you to see Ophthalmology Dr.Sally Sabra Heck  As soon as possible for evaluation of right eye. - Notify provider or go to ED if symptoms worsen or fail to improve    Shingles  Shingles is an infection. It gives you a painful skin rash and blisters that have fluid in them. Shingles is caused by the same germ (virus) that causes chickenpox. Shingles only happens in people who:  Have had chickenpox.  Have been given a shot of medicine (vaccine) to protect against chickenpox. Shingles is rare in this group. The first symptoms of shingles may be itching, tingling, or pain in an area on your skin. A rash will show on your skin a few days or weeks later. The rash is likely to be on one side of your body. The rash usually has a shape like a belt or a band. Over time, the rash turns into fluid-filled blisters. The blisters will break open, change into scabs, and dry up. Medicines may:  Help with pain and itching.  Help you get better sooner.  Help to prevent long-term problems. Follow these instructions at home: Medicines  Take over-the-counter and prescription medicines only as told by your doctor.  Put on an anti-itch cream or numbing cream where you have a rash, blisters, or scabs. Do this as told by your doctor. Helping with itching and discomfort  Put cold, wet cloths (cold compresses) on the area of the rash or blisters as told by your doctor.  Cool baths can help you feel better. Try adding baking soda or dry oatmeal to the water to lessen itching. Do not bathe in hot water.   Blister and rash care  Keep your rash covered with a loose bandage (dressing).  Wear loose clothing that does not rub on your  rash.  Keep your rash and blisters clean. To do this, wash the area with mild soap and cool water as told by your doctor.  Check your rash every day for signs of infection. Check for: ? More redness, swelling, or pain. ? Fluid or blood. ? Warmth. ? Pus or a bad smell.  Do not scratch your rash. Do not pick at your blisters. To help you to not scratch: ? Keep your fingernails clean and cut short. ? Wear gloves or mittens when you sleep, if scratching is a problem. General instructions  Rest as told by your doctor.  Keep all follow-up visits as told by your doctor. This is important.  Wash your hands often with soap and water. If soap and water are not available, use hand sanitizer. Doing this lowers your chance of getting a skin infection caused by germs (bacteria).  Your infection can cause chickenpox in people who have never had chickenpox or never got a shot of chickenpox vaccine. If you have blisters that did not change into scabs yet, try not to touch other people or be around other people, especially: ? Babies. ? Pregnant women. ? Children who have areas of red, itchy, or rough skin (eczema). ? Very old people who have transplants. ? People who have a long-term (chronic) sickness, like cancer or AIDS. Contact a doctor if:  Your pain does not get better with medicine.  Your pain does not get better after the rash heals.  You have any signs of infection in the rash area. These signs include: ? More redness, swelling, or pain around the rash. ? Fluid or blood coming from the rash. ? The rash area feeling warm to the touch. ? Pus or a bad smell coming from the rash. Get help right away if:  The rash is on your face or nose.  You have pain in your face or pain by your eye.  You lose feeling on one side of your face.  You have trouble seeing.  You have ear pain, or you have ringing in your ear.  You have a loss of taste.  Your condition gets  worse. Summary  Shingles gives you a painful skin rash and blisters that have fluid in them.  Shingles is an infection. It is caused by the same germ (virus) that causes chickenpox.  Keep your rash covered with a loose bandage (dressing). Wear loose clothing that does not rub on your rash.  If you have blisters that did not change into scabs yet, try not to touch other people or be around people. This information is not intended to replace advice given to you by your health care provider. Make sure you discuss any questions you have with your health care provider. Document Revised: 06/14/2018 Document Reviewed: 10/25/2016 Elsevier Patient Education  2021 [employer].

## 2020-04-30 ENCOUNTER — Telehealth (HOSPITAL_COMMUNITY): Payer: Self-pay | Admitting: *Deleted

## 2020-04-30 DIAGNOSIS — H5203 Hypermetropia, bilateral: Secondary | ICD-10-CM | POA: Diagnosis not present

## 2020-04-30 DIAGNOSIS — H52223 Regular astigmatism, bilateral: Secondary | ICD-10-CM | POA: Diagnosis not present

## 2020-04-30 DIAGNOSIS — H16141 Punctate keratitis, right eye: Secondary | ICD-10-CM | POA: Diagnosis not present

## 2020-04-30 DIAGNOSIS — H524 Presbyopia: Secondary | ICD-10-CM | POA: Diagnosis not present

## 2020-04-30 MED ORDER — FLECAINIDE ACETATE 50 MG PO TABS: 100.0000 mg | ORAL_TABLET | Freq: Two times a day (BID) | ORAL | 1 refills | Status: DC

## 2020-04-30 NOTE — Telephone Encounter (Signed)
Patient continues to have frequent afib episodes.  She was diagnosed with shingles below her eye yesterday which is very painful. The eye doctor today prescribed several different drops.  Per Adline Peals PA will increase flecainide to 100mg  twice a day with follow up EKG next week. Pt in agreement.

## 2020-05-04 ENCOUNTER — Encounter (HOSPITAL_COMMUNITY): Payer: Medicare Other | Admitting: Physician Assistant

## 2020-05-04 DIAGNOSIS — R42 Dizziness and giddiness: Secondary | ICD-10-CM | POA: Diagnosis not present

## 2020-05-04 DIAGNOSIS — G43111 Migraine with aura, intractable, with status migrainosus: Secondary | ICD-10-CM | POA: Diagnosis not present

## 2020-05-04 DIAGNOSIS — G7100 Muscular dystrophy, unspecified: Secondary | ICD-10-CM | POA: Diagnosis not present

## 2020-05-05 ENCOUNTER — Other Ambulatory Visit: Payer: Self-pay

## 2020-05-05 ENCOUNTER — Ambulatory Visit (HOSPITAL_COMMUNITY)
Admission: RE | Admit: 2020-05-05 | Discharge: 2020-05-05 | Disposition: A | Discharge status: Home / Self Care | Payer: Medicare Other | Source: Ambulatory Visit | Attending: Physician Assistant | Admitting: Physician Assistant

## 2020-05-05 ENCOUNTER — Encounter (HOSPITAL_COMMUNITY): Payer: Self-pay | Admitting: Physician Assistant

## 2020-05-05 VITALS — BP 122/82 | HR 87 | Ht 66.0 in | Wt 111.2 lb

## 2020-05-05 DIAGNOSIS — G43009 Migraine without aura, not intractable, without status migrainosus: Secondary | ICD-10-CM | POA: Diagnosis not present

## 2020-05-05 DIAGNOSIS — I4510 Unspecified right bundle-branch block: Secondary | ICD-10-CM | POA: Insufficient documentation

## 2020-05-05 DIAGNOSIS — G7100 Muscular dystrophy, unspecified: Secondary | ICD-10-CM | POA: Diagnosis not present

## 2020-05-05 DIAGNOSIS — I480 Paroxysmal atrial fibrillation: Secondary | ICD-10-CM | POA: Insufficient documentation

## 2020-05-05 DIAGNOSIS — D6869 Other thrombophilia: Secondary | ICD-10-CM | POA: Insufficient documentation

## 2020-05-05 DIAGNOSIS — I4892 Unspecified atrial flutter: Secondary | ICD-10-CM | POA: Diagnosis not present

## 2020-05-05 DIAGNOSIS — I4891 Unspecified atrial fibrillation: Secondary | ICD-10-CM | POA: Diagnosis not present

## 2020-05-05 DIAGNOSIS — E871 Hypo-osmolality and hyponatremia: Secondary | ICD-10-CM | POA: Diagnosis not present

## 2020-05-05 DIAGNOSIS — I951 Orthostatic hypotension: Secondary | ICD-10-CM | POA: Diagnosis not present

## 2020-05-05 DIAGNOSIS — H8109 Meniere's disease, unspecified ear: Secondary | ICD-10-CM | POA: Diagnosis not present

## 2020-05-05 DIAGNOSIS — I484 Atypical atrial flutter: Secondary | ICD-10-CM

## 2020-05-05 DIAGNOSIS — Z7901 Long term (current) use of anticoagulants: Secondary | ICD-10-CM | POA: Insufficient documentation

## 2020-05-05 MED ORDER — AMIODARONE HCL 200 MG PO TABS: ORAL_TABLET | ORAL | 0 refills | Status: DC

## 2020-05-05 NOTE — Patient Instructions (Signed)
STOP flecainide as of today    On Saturday night -- Start Amiodarone 200mg  twice a day (with food) for 14 days (3/20)  then reduce to 200mg  once a day

## 2020-05-05 NOTE — Progress Notes (Signed)
Primary Care Physician: Lauree Chandler, NP Primary Cardiologist: Dr Meda Coffee Primary Electrophysiologist: none Referring Physician: [facility]   Alicia [name] is a 75 y.o. female with a history of chronic hyponatremia, carotid artery disease, meniere's disease, muscular dystrophy, deafness, atrial fibrillation who presents for follow up in the [facility]. The patient was initially diagnosed with atrial fibrillation 04/06/20 after presenting with symptoms of palpitations and SOB. Due to ongoing symptoms, she was seen by provider at [facility]. She was in sinus rhythm at that time by EKG and sent home.  That night she started to having worsening shortness of breath, palpitations, chest pressure, and fatigue.  Due to persistent symptoms she came to ER for further evaluation.  She was in sinus rhythm on initial evaluation however while in triage waiting for bed she started to having palpitation again.  She was found to have atrial fibrillation with rapid ventricular rate IV Cardizem and then started on drip. Symptoms improved and she eventually converted to sinus rhythm this morning.  Patient reports longstanding history of intermittent palpitation lasting for few minutes. She was started on diltiazem for rate control and Eliquis for stroke prevention. She developed a pruritic rash and theses medications were changed to Toprol and Xarelto. She has a CHADS2VASC score of 3. She was seen at the ED two additional times and flecainide was started 04/26/20.  On follow up today, patient reports that she is still having frequent episodes of tachypalpitations despite the increase in flecainide. She has associated symptoms of severe fatigue. She denies any bleeding issues on anticoagulation.    Today, she denies symptoms of chest pain, shortness of breath, orthopnea, PND, lower extremity edema, presyncope, syncope, snoring, daytime somnolence, bleeding, or neurologic  sequela. The patient is tolerating medications without difficulties and is otherwise without complaint today.    Atrial Fibrillation Risk Factors:  she does not have symptoms or diagnosis of sleep apnea. she does not have a history of rheumatic fever. she does not have a history of alcohol use.   she has a BMI of Body mass index is 17.95 kg/m.Marland Kitchen Filed Weights   05/05/20 1530  Weight: 50.4 kg    Family History  Problem Relation Age of Onset  . Alcohol abuse Mother   . Hypertension Mother   . Diabetes Mother 1  . Stroke Mother 51  . Dementia Mother   . Kidney failure Mother   . Osteoporosis Mother   . Alzheimer's disease Father 43  . Cancer Maternal Aunt 80       pancreatic  . Arthritis Sister 51       knee replacement  . Hyperlipidemia Sister   . Lung disease Maternal Grandmother   . Lung disease Maternal Grandfather   . Hyperlipidemia Sister 49  . Other Son        recent back injury  . Other Daughter        recent back injury; knee issues when playing tennis  . Heart disease Neg Hx   . Heart attack Neg Hx      Atrial Fibrillation Management history:  Previous antiarrhythmic drugs: flecainide Previous cardioversions: none Previous ablations: none CHADS2VASC score: 3 Anticoagulation history: Eliquis (had rash), Xarelto    Past Medical History:  Diagnosis Date  . Bronchitis   . 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 Chronic migraine without aura, intractable, without status migrainosus   . Diverticulosis 2010  . Endometriosis   . Low  sodium levels   . Malnutrition ([place])   . MD (muscular dystrophy) ([place])   . Meniere's disease, bilateral   . [facility]    [facility]  . Peripheral neuropathy    Dr Erling Cruz  . Pneumonia   . Sensorineural hearing loss, bilateral   . Vertigo 2019   Past Surgical History:  Procedure Laterality Date  . ABDOMINAL HYSTERECTOMY  1980 or 1981   for Endometriosis  . APPENDECTOMY  1965   high school; low grade appendiceal  cancer  . BREAST ENHANCEMENT SURGERY Bilateral 1979  . COLONOSCOPY  01/2012   Tics; Dr Teena Irani  . ESI      X 3 in 2003 & 02/17/2011 for L4-S1 symptoms  . LUMBAR LAMINECTOMY  12/2012   W-S , [facility]  . TONSILLECTOMY  1950 or 1951  . TUBAL LIGATION    . vocal cords stripped  1970    Current Outpatient Medications  Medication Sig Dispense Refill  . acetaminophen (TYLENOL) 500 MG tablet Take 500 mg by mouth at bedtime.    . AZELAIC ACID EX Apply 1 application topically 2 (two) times daily. Right after metronidazole cream    . baclofen (LIORESAL) 10 MG tablet Take 10 mg by mouth at bedtime.    . Biotin 5000 MCG TABS Take 5,000 mcg by mouth daily.     . butalbital-acetaminophen-caffeine (FIORICET, ESGIC) 50-325-40 MG tablet Take 1 tablet by mouth every 6 (six) hours as needed for migraine (for severe pain).    . CALCIUM PO Take 1,200 mg by mouth 2 (two) times daily.    . Carboxymethylcellulose Sodium 0.25 % SOLN Apply 1 drop to eye 3 (three) times daily.    . Cholecalciferol (VITAMIN D3) LIQD Take 1 drop by mouth See admin instructions. Takes 1 drop by mouth every day except Sat and Sun    . Evening Primrose Oil 1000 MG CAPS Take 1,000 mg by mouth every evening.    Marland Kitchen FEMRING 0.05 MG/24HR RING Place 1 each vaginally every 3 (three) months.    . flecainide (TAMBOCOR) 50 MG tablet Take 2 tablets (100 mg total) by mouth 2 (two) times daily. 60 tablet 1  . gabapentin (NEURONTIN) 100 MG capsule Take 200 mg by mouth at bedtime.     Marland Kitchen lactase (LACTAID) 3000 units tablet Take 3,000 Units by mouth daily as needed (when eating dairy products).    . Lutein 20 MG TABS Take 20 mg by mouth daily.     Marland Kitchen MAGNESIUM CITRATE PO Take 300 mg by mouth at bedtime.    . meclizine (ANTIVERT) 12.5 MG tablet Take 1 tablet (12.5 mg total) by mouth 3 (three) times daily as needed for dizziness. 30 tablet 0  . methocarbamol (ROBAXIN) 750 MG tablet Take 750 mg by mouth every 6 (six) hours.    . metoprolol succinate  (TOPROL-XL) 25 MG 24 hr tablet Take 0.5 tablets (12.5 mg total) by mouth in the morning and at bedtime. 30 tablet 1  . metroNIDAZOLE (METROGEL) 0.75 % gel Apply 1 application topically 2 (two) times daily. Apply to face for rosacea    . Multiple Vitamin (MULTIVITAMIN WITH MINERALS) TABS tablet Take 1 tablet by mouth daily.    . Multiple Vitamins-Minerals (PRESERVISION AREDS 2) CAPS Take 1 capsule by mouth 2 (two) times daily.    Marland Kitchen neomycin-polymyxin-dexamethasone (MAXITROL) 0.1 % ophthalmic suspension     . Omega-3 1000 MG CAPS Take 1,000 mg by mouth 2 (two) times daily.    Marland Kitchen  ondansetron (ZOFRAN-ODT) 4 MG disintegrating tablet Take 1 tablet (4 mg total) by mouth every 6 (six) hours as needed for nausea or vomiting. 30 tablet 0  . rivaroxaban (XARELTO) 20 MG TABS tablet Take 1 tablet (20 mg total) by mouth daily with supper. 90 tablet 1  . sodium chloride 1 g tablet Take 1 tablet (1 g total) by mouth 2 (two) times daily with a meal. 60 tablet 3  . SUMAtriptan (IMITREX) 50 MG tablet Take 1 tablet (50 mg total) by mouth every 2 (two) hours as needed for migraine. May repeat in 2 hours if headache persists or recurs. 30 tablet 0  . valACYclovir (VALTREX) 1000 MG tablet Take 1 tablet (1,000 mg total) by mouth 2 (two) times daily. 20 tablet 0   No current facility-administered medications for this encounter.    Allergies  Allergen Reactions  . Biaxin [Clarithromycin] Anaphylaxis  . Sulfonamide Derivatives Itching and Rash  . Tequin [Gatifloxacin] Anaphylaxis  . Demerol Nausea And Vomiting  . Lactose Intolerance (Gi) Other (See Comments)    Abdominal pain, gas, bloating  . Nsaids Other (See Comments)    Reaction:  GI upset/heartburn   . Risedronate Sodium Nausea And Vomiting and Other (See Comments)     GI upset/heartburn   . Rofecoxib Nausea And Vomiting and Other (See Comments)    GI upset/heartburn   . Alendronate Sodium Nausea And Vomiting  . Aspirin Other (See Comments)    Abdominal pain    . Cardizem [Diltiazem Hcl] Hives  . Eliquis [Apixaban] Hives  . Fish Allergy Nausea And Vomiting    Can take fish oil  . Benadryl [Diphenhydramine Hcl] Palpitations  . Other Rash    EKG LEADS    Social History   Socioeconomic History  . Marital status: Married    Spouse name: Not on file  . Number of children: Not on file  . Years of education: Not on file  . Highest education level: Not on file  Occupational History  . Not on file  Tobacco Use  . Smoking status: Never Smoker  . Smokeless tobacco: Never Used  Vaping Use  . Vaping Use: Never used  Substance and Sexual Activity  . Alcohol use: Not Currently  . Drug use: Never  . Sexual activity: Not Currently  Other Topics Concern  . Not on file  Social History Narrative   Social History      Diet? Trying to gain weight      Do you drink/eat things with caffeine? 1 mug hot tea with breakfast per day      Marital status?  Married 59 years                                  What year were you married? 1970      Do you live in a house, apartment, assisted living, condo, trailer, etc.? House (46 years)      Is it one or more stories? 2 stories and walk up finished attic      How many persons live in your home? 2      Do you have any pets in your home? (please list) none      Highest level of education completed? 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  yes                                 Type & how often? Walk in neighborhood 4 - 6 times per week if possible      Advanced Directives (Lawyer just beginning to work on these things)      Do you have a living will?      Do you have a DNR form?                    no              If not, do you want to discuss one?      Do you have signed POA/HPOA for forms?       Functional Status      Do you have difficulty bathing or dressing yourself? no      Do  you have difficulty preparing food or eating? no      Do you have difficulty managing your medications? no      Do you have difficulty managing your finances? no      Do you have difficulty affording your medications? no   Social Determinants of Health   Financial Resource Strain: Not on file  Food Insecurity: Not on file  Transportation Needs: Not on file  Physical Activity: Not on file  Stress: Not on file  Social Connections: Not on file  Intimate Partner Violence: Not on file     ROS- All systems are reviewed and negative except as per the HPI above.  Physical Exam: Vitals:   05/05/20 1530  BP: 122/82  Pulse: 87  Weight: 50.4 kg  Height: 5\' 6"  (1.676 m)    GEN- The patient is a well appearing elderly female, alert and oriented x 3 today.   HEENT-head normocephalic, atraumatic, sclera clear, conjunctiva pink, hearing intact, trachea midline. Lungs- Clear to ausculation bilaterally, normal work of breathing Heart- irregular rate and rhythm, no murmurs, rubs or gallops  GI- soft, NT, ND, + BS Extremities- no clubbing, cyanosis, or edema MS- no significant deformity or atrophy Skin- no rash or lesion Psych- euthymic mood, full affect Neuro- strength and sensation are intact   Wt Readings from Last 3 Encounters:  05/05/20 50.4 kg  04/29/20 49.3 kg  04/28/20 49 kg    EKG today demonstrates  Atypical atrial flutter with variable block Vent. rate 87 BPM PR interval * ms QRS duration 124 ms QT/QTc 370/445 ms  Echo 04/06/20 demonstrated  1. Left ventricular ejection fraction, by estimation, is 60 to 65%. The  left ventricle has normal function. The left ventricle has no regional  wall motion abnormalities. Left ventricular diastolic parameters were  normal.  2. Right ventricular systolic function is normal. The right ventricular  size is normal.  3. The mitral valve is normal in structure. No evidence of mitral valve  regurgitation. No evidence of mitral  stenosis.  4. The aortic valve is normal in structure. Aortic valve regurgitation is  not visualized. No aortic stenosis is present.  5. The inferior vena cava is normal in size with greater than 50%  respiratory variability, suggesting right atrial pressure of 3 mmHg.  Epic records are reviewed at length today  CHA2DS2-VASc Score = 3  The patient's score is based upon: CHF History: No HTN History: No Diabetes History: No Stroke History: No Vascular Disease History: No Age Score: 2 Gender  Score: 1      ASSESSMENT AND PLAN: 1. Paroxysmal Atrial Fibrillation/atrial flutter The patient's CHA2DS2-VASc score is 3, indicating a 3.2% annual risk of stroke.   Patient appears to be failing flecainide We discussed therapeutic options today including dofetilide, amiodarone, and ablation. She wants to avoid a hospitalization if at all possible. She is interested in ablation as she is sensitive to many medications. Will plan to start amiodarone 200 mg BID for 2 weeks then decrease to 200 mg daily as a bridge to ablation. Will refer to EP for consideration.  Stop flecainide. She will start amiodarone after 3 day washout.  Recent LFTs and TSH reviewed.  Continue Toprol 12.5 mg BID Continue Xarelto 20 mg daily  2. Secondary Hypercoagulable State (ICD10:  D68.69) The patient is at significant risk for stroke/thromboembolism based upon her CHA2DS2-VASc Score of 3.  Continue Rivaroxaban (Xarelto).    Follow up with EP for evaluation. Will see her back in 2 weeks for ECG if EP consult is several weeks out.    [facility] [address] [place], [place] [address] [phone] 05/05/2020 3:41 PM

## 2020-05-07 ENCOUNTER — Telehealth (HOSPITAL_COMMUNITY): Payer: Self-pay | Admitting: *Deleted

## 2020-05-07 DIAGNOSIS — I951 Orthostatic hypotension: Secondary | ICD-10-CM | POA: Diagnosis not present

## 2020-05-07 DIAGNOSIS — G43009 Migraine without aura, not intractable, without status migrainosus: Secondary | ICD-10-CM | POA: Diagnosis not present

## 2020-05-07 DIAGNOSIS — G7100 Muscular dystrophy, unspecified: Secondary | ICD-10-CM | POA: Diagnosis not present

## 2020-05-07 DIAGNOSIS — I4891 Unspecified atrial fibrillation: Secondary | ICD-10-CM | POA: Diagnosis not present

## 2020-05-07 DIAGNOSIS — E871 Hypo-osmolality and hyponatremia: Secondary | ICD-10-CM | POA: Diagnosis not present

## 2020-05-07 DIAGNOSIS — H8109 Meniere's disease, unspecified ear: Secondary | ICD-10-CM | POA: Diagnosis not present

## 2020-05-07 NOTE — Telephone Encounter (Signed)
Patient called in stating she discussed amiodarone use with her neurologist = concerned regarding neurological side effects with her muscular dystrophy.  Pt would like to stop AAD and just continue on metoprolol for now and discuss afib ablation with Dr. Rayann Heman as scheduled 3/10. Discussed with Adline Peals PA he is in agreement of pt request.

## 2020-05-11 DIAGNOSIS — G7100 Muscular dystrophy, unspecified: Secondary | ICD-10-CM | POA: Diagnosis not present

## 2020-05-11 DIAGNOSIS — G43009 Migraine without aura, not intractable, without status migrainosus: Secondary | ICD-10-CM | POA: Diagnosis not present

## 2020-05-11 DIAGNOSIS — I951 Orthostatic hypotension: Secondary | ICD-10-CM | POA: Diagnosis not present

## 2020-05-11 DIAGNOSIS — I4891 Unspecified atrial fibrillation: Secondary | ICD-10-CM | POA: Diagnosis not present

## 2020-05-11 DIAGNOSIS — H8109 Meniere's disease, unspecified ear: Secondary | ICD-10-CM | POA: Diagnosis not present

## 2020-05-11 DIAGNOSIS — E871 Hypo-osmolality and hyponatremia: Secondary | ICD-10-CM | POA: Diagnosis not present

## 2020-05-13 ENCOUNTER — Encounter: Payer: Self-pay | Admitting: *Deleted

## 2020-05-13 ENCOUNTER — Other Ambulatory Visit: Payer: Self-pay

## 2020-05-13 ENCOUNTER — Encounter: Payer: Self-pay | Admitting: Internal Medicine

## 2020-05-13 ENCOUNTER — Ambulatory Visit (INDEPENDENT_AMBULATORY_CARE_PROVIDER_SITE_OTHER): Payer: Medicare Other | Admitting: Internal Medicine

## 2020-05-13 VITALS — BP 138/84 | HR 69 | Ht 66.0 in | Wt 110.4 lb

## 2020-05-13 DIAGNOSIS — I480 Paroxysmal atrial fibrillation: Secondary | ICD-10-CM

## 2020-05-13 DIAGNOSIS — I4891 Unspecified atrial fibrillation: Secondary | ICD-10-CM

## 2020-05-13 DIAGNOSIS — D6869 Other thrombophilia: Secondary | ICD-10-CM

## 2020-05-13 NOTE — Progress Notes (Signed)
Electrophysiology Office Note   Date:  05/13/2020   ID:  Alicia, Alicia [name] 09-05-1945, MRN [id number]  PCP:  Lauree Chandler, NP  Cardiologist:  Dr Meda Coffee Primary Electrophysiologist: Thompson Grayer, MD    CC: atrial fibrillation   History of Present Illness: Alicia [name] is a 75 y.o. female who presents today for electrophysiology evaluation.   She is referred by Dr Meda Coffee and Adline Peals for EP consult regarding afib/ atrial flutter.  She has muscular dystrophy and deafness.  She has afib.  She presented initially 04/06/20 with fatigue and decreased exercise tolerance.  She has been placed on xarelto for stroke prevention.  She has not tolerated eliquis or flecainide.  She has been fearful of amiodarone.  She has multiple medicine intolerances.   Today, she denies symptoms of palpitations, chest pain, shortness of breath, orthopnea, PND, lower extremity edema, claudication, dizziness, presyncope, syncope, bleeding, or neurologic sequela. The patient is tolerating medications without difficulties and is otherwise without complaint today.    Past Medical History:  Diagnosis Date  . Bronchitis   . 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 Chronic migraine without aura, intractable, without status migrainosus   . Diverticulosis 2010  . Endometriosis   . Low sodium levels   . Malnutrition ([place])   . MD (muscular dystrophy) ([place])   . Meniere's disease, bilateral   . [facility]    [facility]  . Peripheral neuropathy    Dr Erling Cruz  . Pneumonia   . Sensorineural hearing loss, bilateral   . Vertigo 2019   Past Surgical History:  Procedure Laterality Date  . ABDOMINAL HYSTERECTOMY  1980 or 1981   for Endometriosis  . 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 BREAST ENHANCEMENT SURGERY Bilateral 1979  . COLONOSCOPY  01/2012   Tics; Dr Teena Irani  . ESI      X 3 in 2003 & 02/17/2011 for L4-S1 symptoms  . LUMBAR LAMINECTOMY  12/2012   [place]S ,  [place]  . TONSILLECTOMY  1950 or 1951  . TUBAL LIGATION    . vocal cords stripped  1970     Current Outpatient Medications  Medication Sig Dispense Refill  . acetaminophen (TYLENOL) 500 MG tablet Take 500 mg by mouth at bedtime.    . AZELAIC ACID EX Apply 1 application topically 2 (two) times daily. Right after metronidazole cream    . baclofen (LIORESAL) 10 MG tablet Take 10 mg by mouth at bedtime.    . Biotin 5000 MCG TABS Take 5,000 mcg by mouth daily.     . butalbital-acetaminophen-caffeine (FIORICET, ESGIC) 50-325-40 MG tablet Take 1 tablet by mouth every 6 (six) hours as needed for migraine (for severe pain).    . CALCIUM PO Take 1,200 mg by mouth 2 (two) times daily.    . Carboxymethylcellulose Sodium 0.25 % SOLN Apply 1 drop to eye 3 (three) times daily.    . Cholecalciferol (VITAMIN D3) LIQD Take 1 drop by mouth See admin instructions. Takes 1 drop by mouth every day except Sat and Sun    . Evening Primrose Oil 1000 MG CAPS Take 1,000 mg by mouth every evening.    Marland Kitchen FEMRING 0.05 MG/24HR RING Place 1 each vaginally every 3 (three) months.    . gabapentin (NEURONTIN) 100 MG capsule Take 200 mg by mouth at bedtime.     Marland Kitchen lactase (LACTAID) 3000 units tablet Take 3,000 Units by mouth daily  as needed (when eating dairy products).    . Lutein 20 MG TABS Take 20 mg by mouth daily.     Marland Kitchen MAGNESIUM CITRATE PO Take 300 mg by mouth at bedtime.    . meclizine (ANTIVERT) 12.5 MG tablet Take 1 tablet (12.5 mg total) by mouth 3 (three) times daily as needed for dizziness. 30 tablet 0  . methocarbamol (ROBAXIN) 750 MG tablet Take 750 mg by mouth every 6 (six) hours.    . metoprolol succinate (TOPROL-XL) 25 MG 24 hr tablet Take 0.5 tablets (12.5 mg total) by mouth in the morning and at bedtime. 30 tablet 1  . metroNIDAZOLE (METROGEL) 0.75 % gel Apply 1 application topically 2 (two) times daily. Apply to face for rosacea    . Multiple Vitamin (MULTIVITAMIN WITH MINERALS) TABS tablet Take 1 tablet by  mouth daily.    . Multiple Vitamins-Minerals (PRESERVISION AREDS 2) CAPS Take 1 capsule by mouth 2 (two) times daily.    Marland Kitchen neomycin-polymyxin-dexamethasone (MAXITROL) 0.1 % ophthalmic suspension     . Omega-3 1000 MG CAPS Take 1,000 mg by mouth 2 (two) times daily.    . ondansetron (ZOFRAN-ODT) 4 MG disintegrating tablet Take 1 tablet (4 mg total) by mouth every 6 (six) hours as needed for nausea or vomiting. 30 tablet 0  . rivaroxaban (XARELTO) 20 MG TABS tablet Take 1 tablet (20 mg total) by mouth daily with supper. 90 tablet 1  . sodium chloride 1 g tablet Take 1 tablet (1 g total) by mouth 2 (two) times daily with a meal. 60 tablet 3  . SUMAtriptan (IMITREX) 50 MG tablet Take 1 tablet (50 mg total) by mouth every 2 (two) hours as needed for migraine. May repeat in 2 hours if headache persists or recurs. 30 tablet 0  . valACYclovir (VALTREX) 1000 MG tablet Take 1 tablet (1,000 mg total) by mouth 2 (two) times daily. 20 tablet 0   No current facility-administered medications for this visit.    Allergies:   Biaxin [clarithromycin], Sulfonamide derivatives, Tequin [gatifloxacin], Demerol, Lactose intolerance (gi), Nsaids, Risedronate sodium, Rofecoxib, Alendronate sodium, Aspirin, Cardizem [diltiazem hcl], Eliquis [apixaban], Fish allergy, Benadryl [diphenhydramine hcl], and Other   Social History:  The patient  reports that she has never smoked. She has never used smokeless tobacco. She reports previous alcohol use. She reports that she does not use drugs.   Family History:  The patient's  family history includes Alcohol abuse in her mother; Alzheimer's disease (age of onset: 36) in her father; Arthritis (age of onset: 74) in her sister; Cancer (age of onset: 7) in her maternal aunt; Dementia in her mother; Diabetes (age of onset: 62) in her mother; Hyperlipidemia in her sister; Hyperlipidemia (age of onset: 1) in her sister; Hypertension in her mother; Kidney failure in her mother; Lung disease  in her maternal grandfather and maternal grandmother; Osteoporosis in her mother; Other in her daughter and son; Stroke (age of onset: 54) in her mother.    ROS:  Please see the history of present illness.   All other systems are personally reviewed and negative.    PHYSICAL EXAM: VS:  BP 138/84   Pulse 69   Ht 5\' 6"  (1.676 m)   Wt 110 lb 6.4 oz (50.1 kg)   SpO2 98%   BMI 17.82 kg/m  , BMI Body mass index is 17.82 kg/m. GEN: thin appearing, in no acute distress HEENT: normal Neck: no JVD, carotid bruits, or masses Cardiac: RRR; no murmurs, rubs, or  gallops,no edema  Respiratory:  clear to auscultation bilaterally, normal work of breathing GI: soft, nontender, nondistended, + BS MS: no deformity or atrophy Skin: warm and dry  Neuro:  Strength and sensation are intact Psych: euthymic mood, full affect  EKG:  EKG is ordered today. The ekg ordered today is personally reviewed and shows sinus rhythm   Recent Labs: 04/01/2020: ALT 15; TSH 3.76 04/16/2020: B Natriuretic Peptide 240.2 04/26/2020: BUN 15; Creatinine, Ser 0.57; Hemoglobin 13.1; Magnesium 2.3; Platelets 261; Potassium 3.8; Sodium 134  personally reviewed   Lipid Panel     Component Value Date/Time   CHOL 192 03/19/2018 1005   TRIG 52 03/19/2018 1005   TRIG 46 01/29/2006 1132   HDL 88 03/19/2018 1005   CHOLHDL 2.2 03/19/2018 1005   VLDL 9.4 08/20/2012 1032   LDLCALC 91 03/19/2018 1005   LDLDIRECT 124.7 08/20/2012 1032   personally reviewed   Wt Readings from Last 3 Encounters:  05/13/20 110 lb 6.4 oz (50.1 kg)  05/05/20 111 lb 3.2 oz (50.4 kg)  04/29/20 108 lb 9.6 oz (49.3 kg)      Other studies personally reviewed: Additional studies/ records that were reviewed today include: AF clinic notes, prior echo  Review of the above records today demonstrates: as above  Echo 04/06/20- EF 60%   ASSESSMENT AND PLAN:  1.  Paroxysmal atrial fibrillation/ atrial flutter The patient has symptomatic, recurrent  atrial arrhythmias she has failed medical therapy with flecainide. Chads2vasc score is 3.  she is anticoagulated with xarelto . Therapeutic strategies for afib including medicine and ablation were discussed in detail with the patient today. Risk, benefits, and alternatives to EP study and radiofrequency ablation for afib were also discussed in detail today. These risks include but are not limited to stroke, bleeding, vascular damage, tamponade, perforation, damage to the esophagus, lungs, and other structures, pulmonary vein stenosis, worsening renal function, and death. The patient understands these risk and wishes to proceed.  We will therefore proceed with catheter ablation at the next available time.  Carto, ICE, anesthesia are requested for the procedure.  Will also obtain cardiac CT prior to the procedure to exclude LAA thrombus and further evaluate atrial anatomy. I did inform her of my concerns of anesthesia effects on her muscular dystrophy.  She states that she has already discussed with her neurologist who has given her clearance for the procedure.   Thompson Grayer, MD  05/13/2020 12:53 PM     [address] [place] [id number] [phone] (office) [phone] (fax)

## 2020-05-13 NOTE — Patient Instructions (Addendum)
Medication Instructions:  Your physician recommends that you continue on your current medications as directed. Please refer to the Current Medication list given to you today.  Labwork: None ordered.  Testing/Procedures: Your physician has requested that you have cardiac CT. Cardiac computed tomography (CT) is a painless test that uses an x-ray machine to take clear, detailed pictures of your heart. For further information please visit www.cardiosmart.org. Please follow instruction sheet as given.  Your physician has recommended that you have an ablation. Catheter ablation is a medical procedure used to treat some cardiac arrhythmias (irregular heartbeats). During catheter ablation, a long, thin, flexible tube is put into a blood vessel in your groin (upper thigh), or neck. This tube is called an ablation catheter. It is then guided to your heart through the blood vessel. Radio frequency waves destroy small areas of heart tissue where abnormal heartbeats may cause an arrhythmia to start. Please see the instruction sheet given to you today.    Any Other Special Instructions Will Be Listed Below (If Applicable).  If you need a refill on your cardiac medications before your next appointment, please call your pharmacy.   . Cardiac Ablation Cardiac ablation is a procedure to destroy (ablate) some heart tissue that is sending bad signals. These bad signals cause problems in heart rhythm. The heart has many areas that make these signals. If there are problems in these areas, they can make the heart beat in a way that is not normal. Destroying some tissues can help make the heart rhythm normal. Tell your doctor about:  Any allergies you have.  All medicines you are taking. These include vitamins, herbs, eye drops, creams, and over-the-counter medicines.  Any problems you or family members have had with medicines that make you fall asleep (anesthetics).  Any blood disorders you have.  Any  surgeries you have had.  Any medical conditions you have, such as kidney failure.  Whether you are pregnant or may be pregnant. What are the risks? This is a safe procedure. But problems may occur, including:  Infection.  Bruising and bleeding.  Bleeding into the chest.  Stroke or blood clots.  Damage to nearby areas of your body.  Allergies to medicines or dyes.  The need for a pacemaker if the normal system is damaged.  Failure of the procedure to treat the problem. What happens before the procedure? Medicines Ask your doctor about:  Changing or stopping your normal medicines. This is important.  Taking aspirin and ibuprofen. Do not take these medicines unless your doctor tells you to take them.  Taking other medicines, vitamins, herbs, and supplements. General instructions  Follow instructions from your doctor about what you cannot eat or drink.  Plan to have someone take you home from the hospital or clinic.  If you will be going home right after the procedure, plan to have someone with you for 24 hours.  Ask your doctor what steps will be taken to prevent infection. What happens during the procedure?  An IV tube will be put into one of your veins.  You will be given a medicine to help you relax.  The skin on your neck or groin will be numbed.  A cut (incision) will be made in your neck or groin. A needle will be put through your cut and into a large vein.  A tube (catheter) will be put into the needle. The tube will be moved to your heart.  Dye may be put through the tube. This helps   your doctor see your heart.  Small devices (electrodes) on the tube will send out signals.  A type of energy will be used to destroy some heart tissue.  The tube will be taken out.  Pressure will be held on your cut. This helps stop bleeding.  A bandage will be put over your cut. The exact procedure may vary among doctors and hospitals.   What happens after the  procedure?  You will be watched until you leave the hospital or clinic. This includes checking your heart rate, breathing rate, oxygen, and blood pressure.  Your cut will be watched for bleeding. You will need to lie still for a few hours.  Do not drive for 24 hours or as long as your doctor tells you. Summary  Cardiac ablation is a procedure to destroy some heart tissue. This is done to treat heart rhythm problems.  Tell your doctor about any medical conditions you may have. Tell him or her about all medicines you are taking to treat them.  This is a safe procedure. But problems may occur. These include infection, bruising, bleeding, and damage to nearby areas of your body.  Follow what your doctor tells you about food and drink. You may also be told to change or stop some of your medicines.  After the procedure, do not drive for 24 hours or as long as your doctor tells you. This information is not intended to replace advice given to you by your health care provider. Make sure you discuss any questions you have with your health care provider. Document Revised: 01/23/2019 Document Reviewed: 01/23/2019 Elsevier Patient Education  2021 [employer]vier Inc.        

## 2020-05-14 DIAGNOSIS — I4891 Unspecified atrial fibrillation: Secondary | ICD-10-CM | POA: Diagnosis not present

## 2020-05-14 DIAGNOSIS — H903 Sensorineural hearing loss, bilateral: Secondary | ICD-10-CM | POA: Diagnosis not present

## 2020-05-14 DIAGNOSIS — M8580 Other specified disorders of bone density and structure, unspecified site: Secondary | ICD-10-CM | POA: Diagnosis not present

## 2020-05-14 DIAGNOSIS — G629 Polyneuropathy, unspecified: Secondary | ICD-10-CM | POA: Diagnosis not present

## 2020-05-14 DIAGNOSIS — G8929 Other chronic pain: Secondary | ICD-10-CM | POA: Diagnosis not present

## 2020-05-14 DIAGNOSIS — G7100 Muscular dystrophy, unspecified: Secondary | ICD-10-CM | POA: Diagnosis not present

## 2020-05-14 DIAGNOSIS — E871 Hypo-osmolality and hyponatremia: Secondary | ICD-10-CM | POA: Diagnosis not present

## 2020-05-14 DIAGNOSIS — K5730 Diverticulosis of large intestine without perforation or abscess without bleeding: Secondary | ICD-10-CM | POA: Diagnosis not present

## 2020-05-14 DIAGNOSIS — I951 Orthostatic hypotension: Secondary | ICD-10-CM | POA: Diagnosis not present

## 2020-05-14 DIAGNOSIS — G43009 Migraine without aura, not intractable, without status migrainosus: Secondary | ICD-10-CM | POA: Diagnosis not present

## 2020-05-14 DIAGNOSIS — R4181 Age-related cognitive decline: Secondary | ICD-10-CM | POA: Diagnosis not present

## 2020-05-14 DIAGNOSIS — Z7901 Long term (current) use of anticoagulants: Secondary | ICD-10-CM | POA: Diagnosis not present

## 2020-05-14 DIAGNOSIS — M549 Dorsalgia, unspecified: Secondary | ICD-10-CM | POA: Diagnosis not present

## 2020-05-14 DIAGNOSIS — Z9181 History of falling: Secondary | ICD-10-CM | POA: Diagnosis not present

## 2020-05-14 DIAGNOSIS — H8109 Meniere's disease, unspecified ear: Secondary | ICD-10-CM | POA: Diagnosis not present

## 2020-05-19 DIAGNOSIS — I951 Orthostatic hypotension: Secondary | ICD-10-CM | POA: Diagnosis not present

## 2020-05-19 DIAGNOSIS — I4891 Unspecified atrial fibrillation: Secondary | ICD-10-CM | POA: Diagnosis not present

## 2020-05-19 DIAGNOSIS — H8109 Meniere's disease, unspecified ear: Secondary | ICD-10-CM | POA: Diagnosis not present

## 2020-05-19 DIAGNOSIS — G43009 Migraine without aura, not intractable, without status migrainosus: Secondary | ICD-10-CM | POA: Diagnosis not present

## 2020-05-19 DIAGNOSIS — G7100 Muscular dystrophy, unspecified: Secondary | ICD-10-CM | POA: Diagnosis not present

## 2020-05-19 DIAGNOSIS — E871 Hypo-osmolality and hyponatremia: Secondary | ICD-10-CM | POA: Diagnosis not present

## 2020-05-20 ENCOUNTER — Other Ambulatory Visit (HOSPITAL_COMMUNITY): Payer: Self-pay | Admitting: *Deleted

## 2020-05-20 MED ORDER — METOPROLOL SUCCINATE ER 25 MG PO TB24: 12.5000 mg | ORAL_TABLET | Freq: Two times a day (BID) | ORAL | 3 refills | Status: DC

## 2020-05-26 ENCOUNTER — Other Ambulatory Visit: Payer: Self-pay

## 2020-05-26 ENCOUNTER — Other Ambulatory Visit: Payer: Medicare Other | Admitting: *Deleted

## 2020-05-26 DIAGNOSIS — I4891 Unspecified atrial fibrillation: Secondary | ICD-10-CM

## 2020-05-26 DIAGNOSIS — I480 Paroxysmal atrial fibrillation: Secondary | ICD-10-CM

## 2020-05-27 LAB — BASIC METABOLIC PANEL
BUN/Creatinine Ratio: 31 — ABNORMAL HIGH (ref 12–28)
BUN: 18 mg/dL (ref 8–27)
CO2: 26 mmol/L (ref 20–29)
Calcium: 9.5 mg/dL (ref 8.7–10.3)
Chloride: 94 mmol/L — ABNORMAL LOW (ref 96–106)
Creatinine, Ser: 0.59 mg/dL (ref 0.57–1.00)
Glucose: 92 mg/dL (ref 65–99)
Potassium: 4.5 mmol/L (ref 3.5–5.2)
Sodium: 134 mmol/L (ref 134–144)
eGFR: 94 mL/min/{1.73_m2} (ref >59)

## 2020-05-27 LAB — CBC WITH DIFFERENTIAL/PLATELET
Basophils Absolute: 0 10*3/uL (ref 0.0–0.2)
Basos: 1 %
EOS (ABSOLUTE): 0.1 10*3/uL (ref 0.0–0.4)
Eos: 2 %
Hematocrit: 43.2 % (ref 34.0–46.6)
Hemoglobin: 14.1 g/dL (ref 11.1–15.9)
Immature Grans (Abs): 0 10*3/uL (ref 0.0–0.1)
Immature Granulocytes: 0 %
Lymphocytes Absolute: 1.1 10*3/uL (ref 0.7–3.1)
Lymphs: 15 %
MCH: 30.1 pg (ref 26.6–33.0)
MCHC: 32.6 g/dL (ref 31.5–35.7)
MCV: 92 fL (ref 79–97)
Monocytes Absolute: 0.6 10*3/uL (ref 0.1–0.9)
Monocytes: 9 %
Neutrophils Absolute: 5 10*3/uL (ref 1.4–7.0)
Neutrophils: 73 %
Platelets: 294 10*3/uL (ref 150–450)
RBC: 4.68 x10E6/uL (ref 3.77–5.28)
RDW: 12.8 % (ref 11.7–15.4)
WBC: 6.9 10*3/uL (ref 3.4–10.8)

## 2020-06-11 ENCOUNTER — Telehealth (HOSPITAL_COMMUNITY): Payer: Self-pay | Admitting: *Deleted

## 2020-06-11 NOTE — Telephone Encounter (Signed)
Reaching out to patient to offer assistance regarding upcoming cardiac imaging study; pt verbalizes understanding of appt date/time, parking situation and where to check in, pre-test NPO status, and verified current allergies; name and call back number provided for further questions should they arise  Tyasia Packard RN Navigator Cardiac Imaging [facility] [phone] office [phone] cell  

## 2020-06-14 ENCOUNTER — Other Ambulatory Visit: Payer: Self-pay

## 2020-06-14 ENCOUNTER — Ambulatory Visit (HOSPITAL_COMMUNITY)
Admission: RE | Admit: 2020-06-14 | Discharge: 2020-06-14 | Disposition: A | Discharge status: Home / Self Care | Payer: Medicare Other | Source: Ambulatory Visit | Attending: Internal Medicine | Admitting: Internal Medicine

## 2020-06-14 DIAGNOSIS — I4891 Unspecified atrial fibrillation: Secondary | ICD-10-CM | POA: Diagnosis not present

## 2020-06-14 MED ORDER — IOHEXOL 350 MG/ML SOLN
80.0000 mL | Freq: Once | INTRAVENOUS | Status: AC | PRN
  Administered 2020-06-14: 80 mL via INTRAVENOUS

## 2020-06-15 ENCOUNTER — Other Ambulatory Visit (HOSPITAL_COMMUNITY)
Admission: RE | Admit: 2020-06-15 | Discharge: 2020-06-15 | Disposition: A | Discharge status: Home / Self Care | Payer: Medicare Other | Source: Ambulatory Visit | Attending: Internal Medicine | Admitting: Internal Medicine

## 2020-06-15 ENCOUNTER — Other Ambulatory Visit: Payer: Medicare Other

## 2020-06-15 DIAGNOSIS — Z20822 Contact with and (suspected) exposure to covid-19: Secondary | ICD-10-CM | POA: Insufficient documentation

## 2020-06-15 DIAGNOSIS — Z01812 Encounter for preprocedural laboratory examination: Secondary | ICD-10-CM | POA: Insufficient documentation

## 2020-06-15 LAB — SARS CORONAVIRUS 2 (TAT 6-24 HRS): SARS Coronavirus 2: NEGATIVE

## 2020-06-16 ENCOUNTER — Encounter (HOSPITAL_COMMUNITY): Payer: Self-pay | Admitting: Internal Medicine

## 2020-06-16 NOTE — Pre-Procedure Instructions (Signed)
Attempted to call patient regarding procedure instructions for tomorrow.  Unable to leave voicemail, mailbox full.

## 2020-06-17 ENCOUNTER — Ambulatory Visit (HOSPITAL_COMMUNITY)
Admission: RE | Admit: 2020-06-17 | Discharge: 2020-06-17 | Disposition: A | Discharge status: Home / Self Care | Payer: Medicare Other | Source: Ambulatory Visit | Attending: Internal Medicine | Admitting: Internal Medicine

## 2020-06-17 ENCOUNTER — Ambulatory Visit (HOSPITAL_COMMUNITY): Payer: Medicare Other | Admitting: Certified Registered Nurse Anesthetist

## 2020-06-17 ENCOUNTER — Other Ambulatory Visit: Payer: Self-pay

## 2020-06-17 ENCOUNTER — Encounter (HOSPITAL_COMMUNITY)
Admission: RE | Disposition: A | Discharge status: Home / Self Care | Payer: Self-pay | Source: Ambulatory Visit | Attending: Internal Medicine

## 2020-06-17 DIAGNOSIS — M797 Fibromyalgia: Secondary | ICD-10-CM | POA: Diagnosis not present

## 2020-06-17 DIAGNOSIS — G7100 Muscular dystrophy, unspecified: Secondary | ICD-10-CM | POA: Insufficient documentation

## 2020-06-17 DIAGNOSIS — I483 Typical atrial flutter: Secondary | ICD-10-CM | POA: Insufficient documentation

## 2020-06-17 DIAGNOSIS — Z886 Allergy status to analgesic agent status: Secondary | ICD-10-CM | POA: Insufficient documentation

## 2020-06-17 DIAGNOSIS — I484 Atypical atrial flutter: Secondary | ICD-10-CM | POA: Diagnosis not present

## 2020-06-17 DIAGNOSIS — Z79899 Other long term (current) drug therapy: Secondary | ICD-10-CM | POA: Insufficient documentation

## 2020-06-17 DIAGNOSIS — Z885 Allergy status to narcotic agent status: Secondary | ICD-10-CM | POA: Insufficient documentation

## 2020-06-17 DIAGNOSIS — Z882 Allergy status to sulfonamides status: Secondary | ICD-10-CM | POA: Insufficient documentation

## 2020-06-17 DIAGNOSIS — Z7901 Long term (current) use of anticoagulants: Secondary | ICD-10-CM | POA: Diagnosis not present

## 2020-06-17 DIAGNOSIS — Z888 Allergy status to other drugs, medicaments and biological substances status: Secondary | ICD-10-CM | POA: Insufficient documentation

## 2020-06-17 DIAGNOSIS — I480 Paroxysmal atrial fibrillation: Secondary | ICD-10-CM | POA: Insufficient documentation

## 2020-06-17 DIAGNOSIS — E785 Hyperlipidemia, unspecified: Secondary | ICD-10-CM | POA: Diagnosis not present

## 2020-06-17 HISTORY — PX: ATRIAL FIBRILLATION ABLATION: EP1191

## 2020-06-17 SURGERY — ATRIAL FIBRILLATION ABLATION
Anesthesia: General

## 2020-06-17 MED ORDER — METHOCARBAMOL 750 MG PO TABS: 750.0000 mg | ORAL_TABLET | Freq: Four times a day (QID) | ORAL | Status: DC | PRN

## 2020-06-17 MED ORDER — SODIUM CHLORIDE 0.9 % IV SOLN: 250.0000 mL | INTRAVENOUS | Status: DC | PRN

## 2020-06-17 MED ORDER — ONDANSETRON HCL 4 MG/2ML IJ SOLN
INTRAMUSCULAR | Status: DC | PRN
  Administered 2020-06-17: 4 mg via INTRAVENOUS

## 2020-06-17 MED ORDER — HEPARIN (PORCINE) IN NACL 1000-0.9 UT/500ML-% IV SOLN
INTRAVENOUS | Status: DC | PRN
  Administered 2020-06-17: 500 mL

## 2020-06-17 MED ORDER — SUGAMMADEX SODIUM 200 MG/2ML IV SOLN
INTRAVENOUS | Status: DC | PRN
  Administered 2020-06-17: 100 mg via INTRAVENOUS

## 2020-06-17 MED ORDER — ACETAMINOPHEN 325 MG PO TABS: 650.0000 mg | ORAL_TABLET | ORAL | Status: DC | PRN

## 2020-06-17 MED ORDER — HEPARIN SODIUM (PORCINE) 1000 UNIT/ML IJ SOLN
INTRAMUSCULAR | Status: DC | PRN
  Administered 2020-06-17: 15000 [IU] via INTRAVENOUS
  Administered 2020-06-17: 1000 [IU] via INTRAVENOUS

## 2020-06-17 MED ORDER — ROCURONIUM BROMIDE 10 MG/ML (PF) SYRINGE
PREFILLED_SYRINGE | INTRAVENOUS | Status: DC | PRN
  Administered 2020-06-17: 20 mg via INTRAVENOUS
  Administered 2020-06-17: 30 mg via INTRAVENOUS
  Administered 2020-06-17: 50 mg via INTRAVENOUS

## 2020-06-17 MED ORDER — HEPARIN SODIUM (PORCINE) 1000 UNIT/ML IJ SOLN
INTRAMUSCULAR | Status: AC
  Filled 2020-06-17: qty 2

## 2020-06-17 MED ORDER — PROTAMINE SULFATE 10 MG/ML IV SOLN
INTRAVENOUS | Status: DC | PRN
  Administered 2020-06-17: 30 mg via INTRAVENOUS

## 2020-06-17 MED ORDER — SODIUM CHLORIDE 0.9% FLUSH: 3.0000 mL | INTRAVENOUS | Status: DC | PRN

## 2020-06-17 MED ORDER — ONDANSETRON HCL 4 MG/2ML IJ SOLN: 4.0000 mg | Freq: Four times a day (QID) | INTRAMUSCULAR | Status: DC | PRN

## 2020-06-17 MED ORDER — HEPARIN (PORCINE) IN NACL 1000-0.9 UT/500ML-% IV SOLN
INTRAVENOUS | Status: AC
  Filled 2020-06-17: qty 500

## 2020-06-17 MED ORDER — PROPOFOL 10 MG/ML IV BOLUS
INTRAVENOUS | Status: DC | PRN
  Administered 2020-06-17: 20 mg via INTRAVENOUS
  Administered 2020-06-17: 100 ug/kg/min via INTRAVENOUS
  Administered 2020-06-17: 60 mg via INTRAVENOUS
  Administered 2020-06-17: 20 mg via INTRAVENOUS

## 2020-06-17 MED ORDER — PHENYLEPHRINE HCL-NACL 10-0.9 MG/250ML-% IV SOLN
INTRAVENOUS | Status: DC | PRN
  Administered 2020-06-17: 25 ug/min via INTRAVENOUS

## 2020-06-17 MED ORDER — SODIUM CHLORIDE 0.9 % IV SOLN: INTRAVENOUS | Status: DC

## 2020-06-17 MED ORDER — HEPARIN SODIUM (PORCINE) 1000 UNIT/ML IJ SOLN
INTRAMUSCULAR | Status: DC | PRN
  Administered 2020-06-17: 1000 [IU] via INTRAVENOUS

## 2020-06-17 MED ORDER — FENTANYL CITRATE (PF) 100 MCG/2ML IJ SOLN
INTRAMUSCULAR | Status: DC | PRN
  Administered 2020-06-17 (×2): 50 ug via INTRAVENOUS

## 2020-06-17 MED ORDER — MIDAZOLAM HCL 2 MG/2ML IJ SOLN
INTRAMUSCULAR | Status: DC | PRN
  Administered 2020-06-17 (×2): 1 mg via INTRAVENOUS

## 2020-06-17 MED ORDER — SODIUM CHLORIDE 0.9% FLUSH: 3.0000 mL | Freq: Two times a day (BID) | INTRAVENOUS | Status: DC

## 2020-06-17 MED ORDER — HYDROCODONE-ACETAMINOPHEN 5-325 MG PO TABS: 1.0000 | ORAL_TABLET | ORAL | Status: DC | PRN

## 2020-06-17 SURGICAL SUPPLY — 18 items
BLANKET WARM UNDERBOD FULL ACC (MISCELLANEOUS) ×2 IMPLANT
CATH MAPPNG PENTARAY F 2-6-2MM (CATHETERS) ×1 IMPLANT
CATH SMTCH THERMOCOOL SF DF (CATHETERS) ×2 IMPLANT
CATH SOUNDSTAR ECO 8FR (CATHETERS) ×2 IMPLANT
CATH WEB BI DIR CSDF CRV REPRO (CATHETERS) ×2 IMPLANT
CLOSURE PERCLOSE PROSTYLE (VASCULAR PRODUCTS) ×6 IMPLANT
COVER SWIFTLINK CONNECTOR (BAG) ×2 IMPLANT
NEEDLE BAYLIS TRANSSEPTAL 71CM (NEEDLE) ×2 IMPLANT
PACK EP LATEX FREE (CUSTOM PROCEDURE TRAY) ×2
PACK EP LF (CUSTOM PROCEDURE TRAY) ×1 IMPLANT
PAD PRO RADIOLUCENT 2001M-C (PAD) ×2 IMPLANT
PATCH CARTO3 (PAD) ×2 IMPLANT
PENTARAY F 2-6-2MM (CATHETERS) ×2 IMPLANT
SHEATH PINNACLE 7F 10CM (SHEATH) ×4 IMPLANT
SHEATH PINNACLE 9F 10CM (SHEATH) ×2 IMPLANT
SHEATH PROBE COVER 6X72 (BAG) ×2 IMPLANT
SHEATH SWARTZ TS SL2 63CM 8.5F (SHEATH) ×4 IMPLANT
TUBING SMART ABLATE COOLFLOW (TUBING) ×2 IMPLANT

## 2020-06-17 NOTE — Anesthesia Preprocedure Evaluation (Addendum)
Anesthesia Evaluation  Patient identified by MRN, date of birth, ID band Patient awake    Reviewed: Allergy & Precautions, NPO status , Patient's Chart, lab work & pertinent test results  History of Anesthesia Complications Negative for: history of anesthetic complications  Airway Mallampati: II  TM Distance: >3 FB Neck ROM: Full    Dental  (+) Caps, Dental Advisory Given, Teeth Intact   Pulmonary neg shortness of breath, neg sleep apnea, neg COPD, neg recent URI,  Covid-19 Nucleic Acid Test Results Lab Results      Component                Value               Date                      [id number]              NEGATIVE            06/15/2020                [place]              NEGATIVE            04/06/2020                [place]              Not Detected        04/01/2020                [place]              Not Detected        11/01/2018              breath sounds clear to auscultation       Cardiovascular + dysrhythmias Atrial Fibrillation  Rhythm:Regular  1. Left ventricular ejection fraction, by estimation, is 60 to 65%. The  left ventricle has normal function. The left ventricle has no regional  wall motion abnormalities. Left ventricular diastolic parameters were  normal.  2. Right ventricular systolic function is normal. The right ventricular  size is normal.  3. The mitral valve is normal in structure. No evidence of mitral valve  regurgitation. No evidence of mitral stenosis.  4. The aortic valve is normal in structure. Aortic valve regurgitation is  not visualized. No aortic stenosis is present.  5. The inferior vena cava is normal in size with greater than 50%  respiratory variability, suggesting right atrial pressure of 3 mmHg.      Neuro/Psych  Headaches,  Neuromuscular disease negative psych ROS   GI/Hepatic negative GI ROS, Neg liver ROS,   Endo/Other  negative endocrine ROS   Renal/GU negative Renal ROS     Musculoskeletal Muscular dystrophy   Abdominal   Peds  Hematology negative hematology ROS (+)   Anesthesia Other Findings   Reproductive/Obstetrics                            Anesthesia Physical Anesthesia Plan  ASA: II  Anesthesia Plan: General   Post-op Pain Management:    Induction: Intravenous  PONV Risk Score and Plan: 3 and Ondansetron, Propofol infusion and TIVA  Airway Management Planned: Oral ETT  Additional Equipment: None  Intra-op Plan:   Post-operative Plan: Extubation in OR  Informed Consent: I have reviewed the patients History and Physical, chart, labs  and discussed the procedure including the risks, benefits and alternatives for the proposed anesthesia with the patient or authorized representative who has indicated his/her understanding and acceptance.     Dental advisory given  Plan Discussed with: CRNA and Surgeon  Anesthesia Plan Comments: (uncategorized MD with no history of triggering agent anesthetic in chart. Patient denies previous conversations about risk of MH. Will avoid triggering agents and flush circuit according to [place] protocol )        Anesthesia Quick Evaluation

## 2020-06-17 NOTE — Anesthesia Procedure Notes (Signed)
Procedure Name: Intubation Date/Time: 06/17/2020 7:57 AM Performed by: Candis Shine, CRNA Pre-anesthesia Checklist: Patient identified, Emergency Drugs available, Suction available and Patient being monitored Patient Re-evaluated:Patient Re-evaluated prior to induction Oxygen Delivery Method: Circle System Utilized Preoxygenation: Pre-oxygenation with 100% oxygen Induction Type: IV induction Ventilation: Mask ventilation without difficulty Laryngoscope Size: Mac and 3 Grade View: Grade II Tube type: Oral Tube size: 7.0 mm Number of attempts: 1 Airway Equipment and Method: Stylet Placement Confirmation: ETT inserted through vocal cords under direct vision,  positive ETCO2 and breath sounds checked- equal and bilateral Secured at: 22 cm Tube secured with: Tape Dental Injury: Teeth and Oropharynx as per pre-operative assessment

## 2020-06-17 NOTE — Discharge Instructions (Signed)
PLEASE RESUME TAKING XARELTO TONIGHT   Post procedure care instructions No driving for 4 days. No lifting over 5 lbs for 1 week. No vigorous or sexual activity for 1 week. You may return to work/your usual activities on 06/25/20. Keep procedure site clean & dry. If you notice increased pain, swelling, bleeding or pus, call/return!  You may shower after 24 hours, but no soaking in baths/hot tubs/pools for 1 week.    You have an appointment set up with the [facility].  Multiple studies have shown that being followed by a dedicated atrial fibrillation clinic in addition to the standard care you receive from your other physicians improves health. We believe that enrollment in the atrial fibrillation clinic will allow [place] to better care for you.   The phone number to the [facility] is [phone]. The clinic is staffed Monday through Friday from 8:30am to 5pm.  Parking Directions: The clinic is located in the Heart and Vascular Building connected to [facility]. 1)From [address] turn on to [address] and go to the 3rd entrance  (Heart and Vascular entrance) on the right. 2)Look to the right for Heart &Vascular Parking Garage. 3)A code for the entrance is required, for may is 3211.   4)Take the elevators to the 1st floor. Registration is in the room with the glass walls at the end of the hallway.  If you have any trouble parking or locating the clinic, please don't hesitate to call [phone].    Femoral Site Care  This sheet gives you information about how to care for yourself after your procedure. Your health care provider may also give you more specific instructions. If you have problems or questions, contact your health care provider. What can I expect after the procedure? After the procedure, it is common to have:  Bruising that usually fades within 1-2 weeks.  Tenderness at the site. Follow these instructions at home: Wound  care  Follow instructions from your health care provider about how to take care of your insertion site. Make sure you: ? Wash your hands with soap and water before you change your bandage (dressing). If soap and water are not available, use hand sanitizer. ? Change your dressing as told by your health care provider. ? Leave stitches (sutures), skin glue, or adhesive strips in place. These skin closures may need to stay in place for 2 weeks or longer. If adhesive strip edges start to loosen and curl up, you may trim the loose edges. Do not remove adhesive strips completely unless your health care provider tells you to do that.  Do not take baths, swim, or use a hot tub until your health care provider approves.  You may shower 24-48 hours after the procedure or as told by your health care provider. ? Gently wash the site with plain soap and water. ? Pat the area dry with a clean towel. ? Do not rub the site. This may cause bleeding.  Do not apply powder or lotion to the site. Keep the site clean and dry.  Check your femoral site every day for signs of infection. Check for: ? Redness, swelling, or pain. ? Fluid or blood. ? Warmth. ? Pus or a bad smell. Activity  For the first 2-3 days after your procedure, or as long as directed: ? Avoid climbing stairs as much as possible. ? Do not squat.  Do not lift anything that is heavier than 10 lb (4.5 kg), or the limit that  you are told, until your health care provider says that it is safe.  Rest as directed. ? Avoid sitting for a long time without moving. Get up to take short walks every 1-2 hours.  Do not drive for 24 hours if you were given a medicine to help you relax (sedative). General instructions  Take over-the-counter and prescription medicines only as told by your health care provider.  Keep all follow-up visits as told by your health care provider. This is important. Contact a health care provider if you have:  A fever or  chills.  You have redness, swelling, or pain around your insertion site. Get help right away if:  The catheter insertion area swells very fast.  You pass out.  You suddenly start to sweat or your skin gets clammy.  The catheter insertion area is bleeding, and the bleeding does not stop when you hold steady pressure on the area.  The area near or just beyond the catheter insertion site becomes pale, cool, tingly, or numb. These symptoms may represent a serious problem that is an emergency. Do not wait to see if the symptoms will go away. Get medical help right away. Call your local emergency services (911 in the [place] Do not drive yourself to the hospital. Summary  After the procedure, it is common to have bruising that usually fades within 1-2 weeks.  Check your femoral site every day for signs of infection.  Do not lift anything that is heavier than 10 lb (4.5 kg), or the limit that you are told, until your health care provider says that it is safe. This information is not intended to replace advice given to you by your health care provider. Make sure you discuss any questions you have with your health care provider. Document Revised: 10/24/2019 Document Reviewed: 10/24/2019 Elsevier Patient Education  2021 [employer].    Moderate Conscious Sedation, Adult, Care After This sheet gives you information about how to care for yourself after your procedure. Your health care provider may also give you more specific instructions. If you have problems or questions, contact your health care provider. What can I expect after the procedure? After the procedure, it is common to have:  Sleepiness for several hours.  Impaired judgment for several hours.  Difficulty with balance.  Vomiting if you eat too soon. Follow these instructions at home: For the time period you were told by your health care provider:  Rest.  Do not participate in activities where you could fall or become  injured.  Do not drive or use machinery.  Do not drink alcohol.  Do not take sleeping pills or medicines that cause drowsiness.  Do not make important decisions or sign legal documents.  Do not take care of children on your own.      Eating and drinking  Follow the diet recommended by your health care provider.  Drink enough fluid to keep your urine pale yellow.  If you vomit: ? Drink water, juice, or soup when you can drink without vomiting. ? Make sure you have little or no nausea before eating solid foods.   General instructions  Take over-the-counter and prescription medicines only as told by your health care provider.  Have a responsible adult stay with you for the time you are told. It is important to have someone help care for you until you are awake and alert.  Do not smoke.  Keep all follow-up visits as told by your health care provider. This is important.  Contact a health care provider if:  You are still sleepy or having trouble with balance after 24 hours.  You feel light-headed.  You keep feeling nauseous or you keep vomiting.  You develop a rash.  You have a fever.  You have redness or swelling around the IV site. Get help right away if:  You have trouble breathing.  You have new-onset confusion at home. Summary  After the procedure, it is common to feel sleepy, have impaired judgment, or feel nauseous if you eat too soon.  Rest after you get home. Know the things you should not do after the procedure.  Follow the diet recommended by your health care provider and drink enough fluid to keep your urine pale yellow.  Get help right away if you have trouble breathing or new-onset confusion at home. This information is not intended to replace advice given to you by your health care provider. Make sure you discuss any questions you have with your health care provider. Document Revised: 06/20/2019 Document Reviewed: 01/16/2019 Elsevier Patient  Education  2021 [employer].

## 2020-06-17 NOTE — Transfer of Care (Signed)
Immediate Anesthesia Transfer of Care Note  Patient: Alicia [name]  Procedure(s) Performed: ATRIAL FIBRILLATION ABLATION (N/A )  Patient Location: Cath Lab  Anesthesia Type:General  Level of Consciousness: awake, alert  and oriented  Airway & Oxygen Therapy: Patient Spontanous Breathing and Patient connected to nasal cannula oxygen  Post-op Assessment: Report given to RN and Post -op Vital signs reviewed and stable  Post vital signs: Reviewed and stable  Last Vitals:  Vitals Value Taken Time  BP 137/59 06/17/20 0959  Temp    Pulse 67 06/17/20 1000  Resp 13 06/17/20 1000  SpO2 100 % 06/17/20 1000  Vitals shown include unvalidated device data.  Last Pain:  Vitals:   06/17/20 0622  TempSrc:   PainSc: 0-No pain      Patients Stated Pain Goal: 3 (27/61/84 8592)  Complications: No complications documented.

## 2020-06-17 NOTE — H&P (Signed)
CC: atrial fibrillation   History of Present Illness: Alicia [name] is a 75 y.o. female who presents today for electrophysiology study and ablation for afib.  She has muscular dystrophy and deafness.  She has afib.  She presented initially 04/06/20 with fatigue and decreased exercise tolerance.  She has been placed on xarelto for stroke prevention.  She has not tolerated eliquis or flecainide.  She has been fearful of amiodarone.  She has multiple medicine intolerances.   Today, she denies symptoms of palpitations, chest pain, shortness of breath, orthopnea, PND, lower extremity edema, claudication, dizziness, presyncope, syncope, bleeding, or neurologic sequela. The patient is tolerating medications without difficulties and is otherwise without complaint today.        Past Medical History:  Diagnosis Date  . Bronchitis   . 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 Chronic migraine without aura, intractable, without status migrainosus   . Diverticulosis 2010  . Endometriosis   . Low sodium levels   . Malnutrition ([place])   . MD (muscular dystrophy) ([place])   . Meniere's disease, bilateral   . [facility]    [facility]  . Peripheral neuropathy    Dr Erling Cruz  . Pneumonia   . Sensorineural hearing loss, bilateral   . Vertigo 2019        Past Surgical History:  Procedure Laterality Date  . ABDOMINAL HYSTERECTOMY  1980 or 1981   for Endometriosis  . 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 BREAST ENHANCEMENT SURGERY Bilateral 1979  . COLONOSCOPY  01/2012   Tics; Dr Teena Irani  . ESI      X 3 in 2003 & 02/17/2011 for L4-S1 symptoms  . LUMBAR LAMINECTOMY  12/2012   [place] , [place]  . TONSILLECTOMY  1950 or 1951  . TUBAL LIGATION    . vocal cords stripped  1970           Current Outpatient Medications  Medication Sig Dispense Refill  . acetaminophen (TYLENOL) 500 MG tablet Take 500 mg by mouth at bedtime.    .  AZELAIC ACID EX Apply 1 application topically 2 (two) times daily. Right after metronidazole cream    . baclofen (LIORESAL) 10 MG tablet Take 10 mg by mouth at bedtime.    . Biotin 5000 MCG TABS Take 5,000 mcg by mouth daily.     . butalbital-acetaminophen-caffeine (FIORICET, ESGIC) 50-325-40 MG tablet Take 1 tablet by mouth every 6 (six) hours as needed for migraine (for severe pain).    . CALCIUM PO Take 1,200 mg by mouth 2 (two) times daily.    . Carboxymethylcellulose Sodium 0.25 % SOLN Apply 1 drop to eye 3 (three) times daily.    . Cholecalciferol (VITAMIN D3) LIQD Take 1 drop by mouth See admin instructions. Takes 1 drop by mouth every day except Sat and Sun    . Evening Primrose Oil 1000 MG CAPS Take 1,000 mg by mouth every evening.    Marland Kitchen FEMRING 0.05 MG/24HR RING Place 1 each vaginally every 3 (three) months.    . gabapentin (NEURONTIN) 100 MG capsule Take 200 mg by mouth at bedtime.     Marland Kitchen lactase (LACTAID) 3000 units tablet Take 3,000 Units by mouth daily as needed (when eating dairy products).    . Lutein 20 MG TABS Take 20 mg by mouth daily.     Marland Kitchen MAGNESIUM CITRATE PO Take 300 mg by mouth at bedtime.    Marland Kitchen  meclizine (ANTIVERT) 12.5 MG tablet Take 1 tablet (12.5 mg total) by mouth 3 (three) times daily as needed for dizziness. 30 tablet 0  . methocarbamol (ROBAXIN) 750 MG tablet Take 750 mg by mouth every 6 (six) hours.    . metoprolol succinate (TOPROL-XL) 25 MG 24 hr tablet Take 0.5 tablets (12.5 mg total) by mouth in the morning and at bedtime. 30 tablet 1  . metroNIDAZOLE (METROGEL) 0.75 % gel Apply 1 application topically 2 (two) times daily. Apply to face for rosacea    . Multiple Vitamin (MULTIVITAMIN WITH MINERALS) TABS tablet Take 1 tablet by mouth daily.    . Multiple Vitamins-Minerals (PRESERVISION AREDS 2) CAPS Take 1 capsule by mouth 2 (two) times daily.    Marland Kitchen neomycin-polymyxin-dexamethasone (MAXITROL) 0.1 % ophthalmic suspension     .  Omega-3 1000 MG CAPS Take 1,000 mg by mouth 2 (two) times daily.    . ondansetron (ZOFRAN-ODT) 4 MG disintegrating tablet Take 1 tablet (4 mg total) by mouth every 6 (six) hours as needed for nausea or vomiting. 30 tablet 0  . rivaroxaban (XARELTO) 20 MG TABS tablet Take 1 tablet (20 mg total) by mouth daily with supper. 90 tablet 1  . sodium chloride 1 g tablet Take 1 tablet (1 g total) by mouth 2 (two) times daily with a meal. 60 tablet 3  . SUMAtriptan (IMITREX) 50 MG tablet Take 1 tablet (50 mg total) by mouth every 2 (two) hours as needed for migraine. May repeat in 2 hours if headache persists or recurs. 30 tablet 0  . valACYclovir (VALTREX) 1000 MG tablet Take 1 tablet (1,000 mg total) by mouth 2 (two) times daily. 20 tablet 0   No current facility-administered medications for this visit.    Allergies:   Biaxin [clarithromycin], Sulfonamide derivatives, Tequin [gatifloxacin], Demerol, Lactose intolerance (gi), Nsaids, Risedronate sodium, Rofecoxib, Alendronate sodium, Aspirin, Cardizem [diltiazem hcl], Eliquis [apixaban], Fish allergy, Benadryl [diphenhydramine hcl], and Other   Social History:  The patient  reports that she has never smoked. She has never used smokeless tobacco. She reports previous alcohol use. She reports that she does not use drugs.   Family History:  The patient's  family history includes Alcohol abuse in her mother; Alzheimer's disease (age of onset: 8) in her father; Arthritis (age of onset: 3) in her sister; Cancer (age of onset: 79) in her maternal aunt; Dementia in her mother; Diabetes (age of onset: 51) in her mother; Hyperlipidemia in her sister; Hyperlipidemia (age of onset: 69) in her sister; Hypertension in her mother; Kidney failure in her mother; Lung disease in her maternal grandfather and maternal grandmother; Osteoporosis in her mother; Other in her daughter and son; Stroke (age of onset: 83) in her mother.    ROS:  Please see the history of  present illness.   All other systems are personally reviewed and negative.    PHYSICAL EXAM: Vitals:   06/17/20 0604  BP: (!) 191/83  Pulse: 84  Resp: 16  Temp: 98.9 F (37.2 C)  SpO2: 99%   GEN: thin appearing, in no acute distress HEENT: normal Neck: no JVD, carotid bruits, or masses Cardiac: RRR; no murmurs, rubs, or gallops,no edema  Respiratory:  clear to auscultation bilaterally, normal work of breathing GI: soft, nontender, nondistended, + BS MS: no deformity or atrophy Skin: warm and dry  Neuro:  Strength and sensation are intact Psych: euthymic mood, full affect    Other studies personally reviewed: Additional studies/ records that were reviewed today  include: AF clinic notes, prior echo  Review of the above records today demonstrates: as above  Echo 04/06/20- EF 60%   ASSESSMENT AND PLAN:  1.  Paroxysmal atrial fibrillation/ atrial flutter The patient has symptomatic, recurrent atrial arrhythmias she has failed medical therapy with flecainide. Chads2vasc score is 3.  she is anticoagulated with xarelto . Risk, benefits, and alternatives to EP study and radiofrequency ablation for afib were also discussed in detail today. These risks include but are not limited to stroke, bleeding, vascular damage, tamponade, perforation, damage to the esophagus, lungs, and other structures, pulmonary vein stenosis, worsening renal function, and death. The patient understands these risk and wishes to proceed.    She has muscular dystrophy.  Her treating physician has ok'd her for ablation.  Thompson Grayer MD, [facility] 06/17/2020 7:26 AM

## 2020-06-18 ENCOUNTER — Encounter (HOSPITAL_COMMUNITY): Payer: Self-pay | Admitting: Internal Medicine

## 2020-06-18 LAB — POCT ACTIVATED CLOTTING TIME: Activated Clotting Time: 374 seconds

## 2020-06-18 NOTE — Anesthesia Postprocedure Evaluation (Signed)
Anesthesia Post Note  Patient: Alicia [name]  Procedure(s) Performed: ATRIAL FIBRILLATION ABLATION (N/A )     Patient location during evaluation: Cath Lab Anesthesia Type: General Level of consciousness: awake and alert Pain management: pain level controlled Vital Signs Assessment: post-procedure vital signs reviewed and stable Respiratory status: spontaneous breathing, nonlabored ventilation, respiratory function stable and patient connected to nasal cannula oxygen Cardiovascular status: blood pressure returned to baseline and stable Postop Assessment: no apparent nausea or vomiting Anesthetic complications: no   No complications documented.  Last Vitals:  Vitals:   06/17/20 1230 06/17/20 1300  BP: (!) 140/40 (!) 133/58  Pulse: 73 82  Resp: 15 (!) 24  Temp:    SpO2: 97% 98%    Last Pain:  Vitals:   06/17/20 1054  TempSrc:   PainSc: 0-No pain                 Judythe Postema

## 2020-06-18 NOTE — Progress Notes (Signed)
Unable to leave a message mail box full

## 2020-06-23 ENCOUNTER — Telehealth: Payer: Self-pay | Admitting: Internal Medicine

## 2020-06-23 NOTE — Telephone Encounter (Signed)
Patient does not have an implanted device.

## 2020-06-23 NOTE — Telephone Encounter (Addendum)
Advised the patient to remove dressing and do not replace. Clean area and check for signs of infection. Patient not able to remove dressing while we were on the phone. Advised if she sees any signs of infection to call the on-call provider back. She verbalized understanding.

## 2020-06-23 NOTE — Telephone Encounter (Signed)
I have Alicia [name] on the line and she said she had an ablation done around a week ago and wants to know do she change her dressing. wants to ask important questions about the dressing and wound.

## 2020-06-24 ENCOUNTER — Telehealth (HOSPITAL_COMMUNITY): Payer: Self-pay | Admitting: *Deleted

## 2020-06-24 ENCOUNTER — Ambulatory Visit (HOSPITAL_COMMUNITY): Payer: Medicare Other | Admitting: Physician Assistant

## 2020-06-24 NOTE — Telephone Encounter (Addendum)
Pt called with question regarding migraine medication recommendations that does not provoke afib.  Ricky Fenton PA researched meds and Nortec is the patient's best option.  If this is not controlling her migraines recommend following up with neurologist for next steps - recent notes from neurologist mentioned referral to migraine specialist. Pt verbalized understanding of recommendations.

## 2020-07-15 ENCOUNTER — Encounter (HOSPITAL_COMMUNITY): Payer: Self-pay | Admitting: Physician Assistant

## 2020-07-15 ENCOUNTER — Ambulatory Visit (HOSPITAL_COMMUNITY)
Admission: RE | Admit: 2020-07-15 | Discharge: 2020-07-15 | Disposition: A | Discharge status: Home / Self Care | Payer: Medicare Other | Source: Ambulatory Visit | Attending: Physician Assistant | Admitting: Physician Assistant

## 2020-07-15 ENCOUNTER — Other Ambulatory Visit: Payer: Self-pay

## 2020-07-15 VITALS — BP 140/68 | HR 79 | Ht 66.0 in | Wt 109.8 lb

## 2020-07-15 DIAGNOSIS — Z8249 Family history of ischemic heart disease and other diseases of the circulatory system: Secondary | ICD-10-CM | POA: Diagnosis not present

## 2020-07-15 DIAGNOSIS — I483 Typical atrial flutter: Secondary | ICD-10-CM

## 2020-07-15 DIAGNOSIS — H9190 Unspecified hearing loss, unspecified ear: Secondary | ICD-10-CM | POA: Diagnosis not present

## 2020-07-15 DIAGNOSIS — I2510 Atherosclerotic heart disease of native coronary artery without angina pectoris: Secondary | ICD-10-CM | POA: Diagnosis not present

## 2020-07-15 DIAGNOSIS — I4892 Unspecified atrial flutter: Secondary | ICD-10-CM | POA: Diagnosis not present

## 2020-07-15 DIAGNOSIS — H8109 Meniere's disease, unspecified ear: Secondary | ICD-10-CM | POA: Diagnosis not present

## 2020-07-15 DIAGNOSIS — Z79899 Other long term (current) drug therapy: Secondary | ICD-10-CM | POA: Diagnosis not present

## 2020-07-15 DIAGNOSIS — Z7901 Long term (current) use of anticoagulants: Secondary | ICD-10-CM | POA: Diagnosis not present

## 2020-07-15 DIAGNOSIS — G7100 Muscular dystrophy, unspecified: Secondary | ICD-10-CM | POA: Insufficient documentation

## 2020-07-15 DIAGNOSIS — D6869 Other thrombophilia: Secondary | ICD-10-CM | POA: Diagnosis not present

## 2020-07-15 DIAGNOSIS — I480 Paroxysmal atrial fibrillation: Secondary | ICD-10-CM

## 2020-07-15 MED ORDER — METOPROLOL SUCCINATE ER 25 MG PO TB24: 12.5000 mg | ORAL_TABLET | Freq: Two times a day (BID) | ORAL | 3 refills | Status: DC

## 2020-07-15 NOTE — Progress Notes (Signed)
Primary Care Physician: [name], NP Primary Electrophysiologist: Dr Johney Frame Referring Physician: [facility]   Alicia [name] is a 75 y.o. female with a history of chronic hyponatremia, carotid artery disease, meniere's disease, muscular dystrophy, deafness, atrial fibrillation who presents for follow up in the [facility]. The patient was initially diagnosed with atrial fibrillation 04/06/20 after presenting with symptoms of palpitations and SOB. Due to ongoing symptoms, she was seen by provider at [facility]. She was in sinus rhythm at that time by EKG and sent home.  That night she started to having worsening shortness of breath, palpitations, chest pressure, and fatigue.  Due to persistent symptoms she came to ER for further evaluation.  She was in sinus rhythm on initial evaluation however while in triage waiting for bed she started to having palpitation again.  She was found to have atrial fibrillation with rapid ventricular rate IV Cardizem and then started on drip. Symptoms improved and she eventually converted to sinus rhythm this morning.  Patient reports longstanding history of intermittent palpitation lasting for few minutes. She was started on diltiazem for rate control and Eliquis for stroke prevention. She developed a pruritic rash and theses medications were changed to Toprol and Xarelto. She has a CHADS2VASC score of 3. She was seen at the ED two additional times and flecainide was started 04/26/20.  On follow up today, patient is s/p afib and flutter ablation with Dr Johney Frame on 06/17/20. She reports that since the procedure she has had episodes of afib but they have been much shorter (1 minute or less) than before the ablation. She denies CP, swallowing pain, or groin issues. She denies any bleeding issues on anticoagulation.   Today, she denies symptoms of chest pain, shortness of breath, orthopnea, PND, lower extremity edema,  presyncope, syncope, snoring, daytime somnolence, bleeding, or neurologic sequela. The patient is tolerating medications without difficulties and is otherwise without complaint today.    Atrial Fibrillation Risk Factors:  she does not have symptoms or diagnosis of sleep apnea. she does not have a history of rheumatic fever. she does not have a history of alcohol use.   she has a BMI of Body mass index is 17.72 kg/m.Marland Kitchen Filed Weights   07/15/20 1145  Weight: 49.8 kg    Family History  Problem Relation Age of Onset  . Alcohol abuse Mother   . Hypertension Mother   . Diabetes Mother 2  . Stroke Mother 76  . Dementia Mother   . Kidney failure Mother   . Osteoporosis Mother   . Alzheimer's disease Father 45  . Cancer Maternal Aunt 80       pancreatic  . Arthritis Sister 53       knee replacement  . Hyperlipidemia Sister   . Lung disease Maternal Grandmother   . Lung disease Maternal Grandfather   . Hyperlipidemia Sister 32  . Other Son        recent back injury  . Other Daughter        recent back injury; knee issues when playing tennis  . Heart disease Neg Hx   . Heart attack Neg Hx      Atrial Fibrillation Management history:  Previous antiarrhythmic drugs: flecainide Previous cardioversions: none Previous ablations: 06/17/20 CHADS2VASC score: 3 Anticoagulation history: Eliquis (rash), Xarelto    Past Medical History:  Diagnosis Date  . Bronchitis   . 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 Chronic migraine  without aura, intractable, without status migrainosus   . Diverticulosis 2010  . Endometriosis   . Low sodium levels   . Malnutrition ([place])   . MD (muscular dystrophy) ([place])   . Meniere's disease, bilateral   . [facility]    [facility]  . Peripheral neuropathy    Dr Erling Cruz  . Pneumonia   . Sensorineural hearing loss, bilateral   . Vertigo 2019   Past Surgical History:  Procedure Laterality Date  . ABDOMINAL HYSTERECTOMY  1980 or 1981   for  Endometriosis  . 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 ATRIAL FIBRILLATION ABLATION N/A 06/17/2020   Procedure: ATRIAL FIBRILLATION ABLATION;  Surgeon: Thompson Grayer, MD;  Location: [facility];  Service: Cardiovascular;  Laterality: N/A;  . BREAST ENHANCEMENT SURGERY Bilateral 1979  . COLONOSCOPY  01/2012   Tics; Dr Teena Irani  . ESI      X 3 in 2003 & 02/17/2011 for L4-S1 symptoms  . LUMBAR LAMINECTOMY  12/2012   W-S , [place]  . TONSILLECTOMY  1950 or 1951  . TUBAL LIGATION    . vocal cords stripped  1970    Current Outpatient Medications  Medication Sig Dispense Refill  . acetaminophen (TYLENOL) 500 MG tablet Take 500 mg by mouth at bedtime as needed for moderate pain.    . AZELAIC ACID EX Apply 1 application topically 2 (two) times daily as needed (rosacea). Azelaic Acid 10%  use right after metronidazole cream    . baclofen (LIORESAL) 10 MG tablet Take 10 mg by mouth at bedtime.    . Biotin 5000 MCG TABS Take 5,000 mcg by mouth daily.     . butalbital-acetaminophen-caffeine (FIORICET, ESGIC) 50-325-40 MG tablet Take 1 tablet by mouth every 6 (six) hours as needed (muscle pain).    . Calcium Citrate-Vitamin D (CALCIUM + D PO) Take 1 tablet by mouth 3 (three) times daily.    . Carboxymethylcellulose Sodium (THERATEARS) 0.25 % SOLN Place 1 drop into both eyes 4 (four) times daily.    . Cholecalciferol (VITAMIN D3) LIQD Take 1,000 Units by mouth every Monday, Tuesday, Wednesday, Thursday, and Friday.    . Evening Primrose Oil 1000 MG CAPS Take 1,000 mg by mouth every evening.    Marland Kitchen FEMRING 0.05 MG/24HR RING Place 1 each vaginally every 3 (three) months.    . gabapentin (NEURONTIN) 100 MG capsule Take 200 mg by mouth at bedtime.     Marland Kitchen ipratropium (ATROVENT) 0.06 % nasal spray Place 2 sprays into both nostrils 4 (four) times daily as needed for rhinitis.    Marland Kitchen levocetirizine (XYZAL) 5 MG tablet Take 5 mg by mouth daily as needed for allergies.    . Lutein 20  MG TABS Take 20 mg by mouth daily.     . Magnesium 300 MG CAPS Take 300 mg by mouth at bedtime.    . meclizine (ANTIVERT) 12.5 MG tablet Take 1 tablet (12.5 mg total) by mouth 3 (three) times daily as needed for dizziness. 30 tablet 0  . methocarbamol (ROBAXIN) 750 MG tablet Take 750 mg by mouth every 6 (six) hours.    . metroNIDAZOLE (METROGEL) 0.75 % gel Apply 1 application topically 2 (two) times daily as needed (rosacea).    . Multiple Vitamin (MULTIVITAMIN WITH MINERALS) TABS tablet Take 1 tablet by mouth daily.    . Multiple Vitamins-Minerals (PRESERVISION AREDS 2) CAPS Take 1 capsule by mouth 2 (two) times daily.    Marland Kitchen  Omega-3 1000 MG CAPS Take 1,000 mg by mouth 2 (two) times daily.    . ondansetron (ZOFRAN-ODT) 4 MG disintegrating tablet Take 1 tablet (4 mg total) by mouth every 6 (six) hours as needed for nausea or vomiting. 30 tablet 0  . Rimegepant Sulfate (NURTEC) 75 MG TBDP Take 75 mg by mouth daily as needed (migraine).    . rivaroxaban (XARELTO) 20 MG TABS tablet Take 1 tablet (20 mg total) by mouth daily with supper. 90 tablet 1  . sodium chloride 1 g tablet Take 1 tablet (1 g total) by mouth 2 (two) times daily with a meal. 60 tablet 3  . SUMAtriptan (IMITREX) 50 MG tablet Take 1 tablet (50 mg total) by mouth every 2 (two) hours as needed for migraine. May repeat in 2 hours if headache persists or recurs. 30 tablet 0  . valACYclovir (VALTREX) 1000 MG tablet Take 1 tablet (1,000 mg total) by mouth 2 (two) times daily. 20 tablet 0  . metoprolol succinate (TOPROL-XL) 25 MG 24 hr tablet Take 0.5 tablets (12.5 mg total) by mouth in the morning and at bedtime. 30 tablet 3   No current facility-administered medications for this encounter.    Allergies  Allergen Reactions  . Biaxin [Clarithromycin] Anaphylaxis  . Sulfonamide Derivatives Itching and Rash  . Tequin [Gatifloxacin] Anaphylaxis  . Demerol Nausea And Vomiting  . Lactose Intolerance (Gi) Other (See Comments)    Mild  abdominal pain, gas, bloating  . Nsaids Other (See Comments)    GI upset/stomach pain  . Risedronate Sodium Nausea And Vomiting and Other (See Comments)     GI upset/heartburn   . Rofecoxib Nausea And Vomiting and Other (See Comments)    GI upset/heartburn   . Alendronate Sodium Nausea And Vomiting  . Aspirin Other (See Comments)    Abdominal pain   . Cardizem [Diltiazem Hcl] Hives  . Eliquis [Apixaban] Hives  . Fish Allergy Nausea And Vomiting    Can take fish oil  . Benadryl [Diphenhydramine Hcl] Palpitations  . Other Itching and Rash    EKG LEADS caused burning sores     Social History   Socioeconomic History  . Marital status: Married    Spouse name: Not on file  . Number of children: Not on file  . Years of education: Not on file  . Highest education level: Not on file  Occupational History  . Not on file  Tobacco Use  . Smoking status: Never Smoker  . Smokeless tobacco: Never Used  Vaping Use  . Vaping Use: Never used  Substance and Sexual Activity  . Alcohol use: Not Currently  . Drug use: Never  . Sexual activity: Not Currently  Other Topics Concern  . Not on file  Social History Narrative   Social History      Diet? Trying to gain weight      Do you drink/eat things with caffeine? 1 mug hot tea with breakfast per day      Marital status?  Married 26 years                                  What year were you married? 1970      Do you live in a house, apartment, assisted living, condo, trailer, etc.? House (46 years)      Is it one or more stories? 2 stories and walk up finished attic  How many persons live in your home? 2      Do you have any pets in your home? (please list) none      Highest level of education completed? 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     yes                                 Type & how often? Walk in  neighborhood 4 - 6 times per week if possible      Advanced Directives (Lawyer just beginning to work on these things)      Do you have a living will?      Do you have a DNR form?                    no              If not, do you want to discuss one?      Do you have signed POA/HPOA for forms?       Functional Status      Do you have difficulty bathing or dressing yourself? no      Do you have difficulty preparing food or eating? no      Do you have difficulty managing your medications? no      Do you have difficulty managing your finances? no      Do you have difficulty affording your medications? no   Social Determinants of Health   Financial Resource Strain: Not on file  Food Insecurity: Not on file  Transportation Needs: Not on file  Physical Activity: Not on file  Stress: Not on file  Social Connections: Not on file  Intimate Partner Violence: Not on file     ROS- All systems are reviewed and negative except as per the HPI above.  Physical Exam: Vitals:   07/15/20 1145  BP: 140/68  Pulse: 79  Weight: 49.8 kg  Height: 5\' 6"  (1.676 m)    GEN- The patient is a well appearing elderly female, alert and oriented x 3 today.   HEENT-head normocephalic, atraumatic, sclera clear, conjunctiva pink, hearing intact, trachea midline. Lungs- Clear to ausculation bilaterally, normal work of breathing Heart- Regular rate and rhythm, no murmurs, rubs or gallops  GI- soft, NT, ND, + BS Extremities- no clubbing, cyanosis, or edema MS- no significant deformity or atrophy Skin- no rash or lesion Psych- euthymic mood, full affect Neuro- strength and sensation are intact   Wt Readings from Last 3 Encounters:  07/15/20 49.8 kg  06/17/20 48.1 kg  05/13/20 50.1 kg    EKG today demonstrates  SR Vent. rate 79 BPM PR interval 156 ms QRS duration 96 ms QT/QTcB 364/417 ms  Echo 04/06/20 demonstrated  1. Left ventricular ejection fraction, by estimation, is 60 to 65%. The   left ventricle has normal function. The left ventricle has no regional  wall motion abnormalities. Left ventricular diastolic parameters were  normal.  2. Right ventricular systolic function is normal. The right ventricular  size is normal.  3. The mitral valve is normal in structure. No evidence of mitral valve  regurgitation. No evidence of mitral stenosis.  4. The aortic valve is normal in structure. Aortic valve regurgitation is  not visualized. No aortic stenosis is present.  5. The inferior vena cava is normal in size with greater than 50%  respiratory variability, suggesting right atrial pressure of 3 mmHg.  Epic records are reviewed at length today  CHA2DS2-VASc Score = 3  The patient's score is based upon: CHF History: No HTN History: No Diabetes History: No Stroke History: No Vascular Disease History: No Age Score: 2 Gender Score: 1      ASSESSMENT AND PLAN: 1. Paroxysmal Atrial Fibrillation/atrial flutter The patient's CHA2DS2-VASc score is 3, indicating a 3.2% annual risk of stroke.   S/p afib and flutter ablation with Dr Rayann Heman on 06/17/20. Reassured patient that some afib is not uncommon for the first 3 months post ablation.   Continue Toprol 12.5 mg BID Continue Xarelto 20 mg daily with no missed doses for at least 3 months post ablation.   2. Secondary Hypercoagulable State (ICD10:  D68.69) The patient is at significant risk for stroke/thromboembolism based upon her CHA2DS2-VASc Score of 3.  Continue Rivaroxaban (Xarelto).    Follow up with Dr Rayann Heman as scheduled.    [facility] [address] [place], [place] [address] [phone] 07/15/2020 1:11 PM

## 2020-08-18 ENCOUNTER — Encounter: Payer: Self-pay | Admitting: Nurse Practitioner

## 2020-08-18 ENCOUNTER — Other Ambulatory Visit: Payer: Self-pay

## 2020-08-18 ENCOUNTER — Ambulatory Visit (INDEPENDENT_AMBULATORY_CARE_PROVIDER_SITE_OTHER): Payer: Medicare Other | Admitting: Nurse Practitioner

## 2020-08-18 VITALS — BP 140/70 | Temp 97.8°F | Ht 66.0 in | Wt 105.2 lb

## 2020-08-18 DIAGNOSIS — I4891 Unspecified atrial fibrillation: Secondary | ICD-10-CM | POA: Diagnosis not present

## 2020-08-18 DIAGNOSIS — R42 Dizziness and giddiness: Secondary | ICD-10-CM

## 2020-08-18 DIAGNOSIS — E871 Hypo-osmolality and hyponatremia: Secondary | ICD-10-CM | POA: Diagnosis not present

## 2020-08-18 DIAGNOSIS — R110 Nausea: Secondary | ICD-10-CM | POA: Diagnosis not present

## 2020-08-18 DIAGNOSIS — R1032 Left lower quadrant pain: Secondary | ICD-10-CM | POA: Diagnosis not present

## 2020-08-18 LAB — CBC WITH DIFFERENTIAL/PLATELET
Absolute Monocytes: 398 cells/uL (ref 200–950)
Basophils Absolute: 19 cells/uL (ref 0–200)
Basophils Relative: 0.4 %
Eosinophils Absolute: 82 cells/uL (ref 15–500)
Eosinophils Relative: 1.7 %
HCT: 40.2 % (ref 35.0–45.0)
Hemoglobin: 13.4 g/dL (ref 11.7–15.5)
Lymphs Abs: 499 cells/uL — ABNORMAL LOW (ref 850–3900)
MCH: 30.7 pg (ref 27.0–33.0)
MCHC: 33.3 g/dL (ref 32.0–36.0)
MCV: 92 fL (ref 80.0–100.0)
MPV: 10.6 fL (ref 7.5–12.5)
Monocytes Relative: 8.3 %
Neutro Abs: 3802 cells/uL (ref 1500–7800)
Neutrophils Relative %: 79.2 %
Platelets: 308 10*3/uL (ref 140–400)
RBC: 4.37 10*6/uL (ref 3.80–5.10)
RDW: 11.9 % (ref 11.0–15.0)
Total Lymphocyte: 10.4 %
WBC: 4.8 10*3/uL (ref 3.8–10.8)

## 2020-08-18 LAB — BASIC METABOLIC PANEL WITH GFR
BUN/Creatinine Ratio: 27 (calc) — ABNORMAL HIGH (ref 6–22)
BUN: 14 mg/dL (ref 7–25)
CO2: 30 mmol/L (ref 20–32)
Calcium: 9.6 mg/dL (ref 8.6–10.4)
Chloride: 92 mmol/L — ABNORMAL LOW (ref 98–110)
Creat: 0.52 mg/dL — ABNORMAL LOW (ref 0.60–0.93)
GFR, Est African American: 108 mL/min/{1.73_m2} (ref >=60)
GFR, Est Non African American: 93 mL/min/{1.73_m2} (ref >=60)
Glucose, Bld: 111 mg/dL (ref 65–139)
Potassium: 4.7 mmol/L (ref 3.5–5.3)
Sodium: 129 mmol/L — ABNORMAL LOW (ref 135–146)

## 2020-08-18 MED ORDER — MECLIZINE HCL 12.5 MG PO TABS: 12.5000 mg | ORAL_TABLET | Freq: Three times a day (TID) | ORAL | 0 refills | Status: DC | PRN

## 2020-08-18 MED ORDER — ONDANSETRON 4 MG PO TBDP: 4.0000 mg | ORAL_TABLET | Freq: Four times a day (QID) | ORAL | 1 refills | Status: DC | PRN

## 2020-08-18 NOTE — Patient Instructions (Addendum)
Continue to take miralax daily- to use half dose daily and if needed increase to full dose.   Let us know about neurologist and we will refer you for second opinion.   If your abdominal pain has not improved by Friday or worsens to notify office. A

## 2020-08-18 NOTE — Progress Notes (Signed)
Careteam: Patient Care Team: Lauree Chandler, NP as PCP - General (Geriatric Medicine) Deliah Goody, PA-C as Consulting Physician (Physician Assistant) Danella Sensing, MD as Consulting Physician (Dermatology) Everett Graff, MD as Consulting Physician (Obstetrics and Gynecology) Vicie Mutters, MD as Consulting Physician (Otolaryngology) Marshell Garfinkel, MD as Consulting Physician (Pulmonary Disease) Gayla Medicus, MD as Referring Physician (Neurology)  PLACE OF SERVICE:  [place] Directive information Does Patient Have a Medical Advance Directive?: No, Would patient like information on creating a medical advance directive?: No - Patient declined  Allergies  Allergen Reactions   Biaxin [Clarithromycin] Anaphylaxis   Sulfonamide Derivatives Itching and Rash   Tequin [Gatifloxacin] Anaphylaxis   Demerol Nausea And Vomiting   Lactose Intolerance (Gi) Other (See Comments)    Mild abdominal pain, gas, bloating   Nsaids Other (See Comments)    GI upset/stomach pain   Risedronate Sodium Nausea And Vomiting and Other (See Comments)     GI upset/heartburn    Rofecoxib Nausea And Vomiting and Other (See Comments)    GI upset/heartburn    Alendronate Sodium Nausea And Vomiting   Aspirin Other (See Comments)    Abdominal pain    Cardizem [Diltiazem Hcl] Hives   Eliquis [Apixaban] Hives   Fish Allergy Nausea And Vomiting    Can take fish oil   Benadryl [Diphenhydramine Hcl] Palpitations   Other Itching and Rash    EKG LEADS caused burning sores     Chief Complaint  Patient presents with   Medical Management of Chronic Issues    4 month follow up. Patient has been dealing with vertigo for the past few weeks and has concerns about loose stools and pain left lower abdominal area.   Health Maintenance    Zoster, 2nd COVID booster, Zoster vaccine, colonoscopy     HPI: Patient is a 75 y.o. female for routine follow up.  She had ablation done in April and reports she  still goes into short burst of a fib She continues on xarelto and metoprolol 12.5 mg by mouth twice daily.  Reports she has been having vertigo since May. Had the worst spell that she has had in a long time. Using meclizine but has not been effective. She has been seening ENT specialist in the past. She has hx of hearing loss and migraine.  She reports her vertigo is likely due to migraines. She was told in the past if she could prevent migraine she would not have vertigo but she reports she has not been having any headaches recently. She is taking nurtec but does not feel like it is effective.  She has been offered botox to help with the migraines which could also help with the vertigo.    She now having nausea and constipation.  She has had a hx of diverticulitis.  In the past when she got that she ended up in the ED.  She has been having lower abdominal pain.  She started miralax to help with constipation and pain- this did help.  Now she is having soft stools and backing off on the miralax.   Taking sodium tablet twice daily  Review of Systems:  Review of Systems  Constitutional:  Negative for chills, fever and weight loss.  HENT:  Negative for tinnitus.   Respiratory:  Negative for cough, sputum production and shortness of breath.   Cardiovascular:  Negative for chest pain, palpitations and leg swelling.  Gastrointestinal:  Positive for constipation and nausea. Negative for abdominal  pain and heartburn.  Genitourinary:  Negative for dysuria, frequency and urgency.  Musculoskeletal:  Negative for back pain, falls, joint pain and myalgias.  Skin: Negative.   Neurological:  Positive for dizziness and weakness. Negative for headaches.  Psychiatric/Behavioral:  Negative for depression and memory loss. The patient does not have insomnia.    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   Chronic migraine without aura, intractable, without status  migrainosus    Diverticulosis 2010   Endometriosis    Low sodium levels    Malnutrition (HCC)    MD (muscular dystrophy) ([place])    Meniere's disease, bilateral    [facility]   Peripheral neuropathy    Dr Erling Cruz   Pneumonia    Sensorineural hearing loss, bilateral    Vertigo 2019   Past Surgical History:  Procedure Laterality Date   ABDOMINAL HYSTERECTOMY  1980 or 1981   for Endometriosis   APPENDECTOMY  1965   high school; low grade appendiceal cancer   ATRIAL FIBRILLATION ABLATION N/A 06/17/2020   Procedure: ATRIAL FIBRILLATION ABLATION;  Surgeon: Thompson Grayer, MD;  Location: [facility];  Service: Cardiovascular;  Laterality: N/A;   BREAST ENHANCEMENT SURGERY Bilateral 1979   COLONOSCOPY  01/2012   Tics; Dr Teena Irani   ESI      X 3 in 2003 & 02/17/2011 for L4-S1 symptoms   LUMBAR LAMINECTOMY  12/2012   W-S , [place]   TONSILLECTOMY  1950 or [place]     vocal cords stripped  1970   Social History:   reports that she has never smoked. She has never used smokeless tobacco. She reports previous alcohol use. She reports that she does not use drugs.  Family History  Problem Relation Age of Onset   Alcohol abuse Mother    Hypertension Mother    Diabetes Mother 32   Stroke Mother 45   Dementia Mother    Kidney failure Mother    Osteoporosis Mother    Alzheimer's disease Father 33   Cancer Maternal Aunt 82       pancreatic   Arthritis Sister 75       knee replacement   Hyperlipidemia Sister    Lung disease Maternal Grandmother    Lung disease Maternal Grandfather    Hyperlipidemia Sister 36   Other Son        recent back injury   Other Daughter        recent back injury; knee issues when playing tennis   Heart disease Neg Hx    Heart attack Neg Hx     Medications: Patient's Medications  New Prescriptions   No medications on file  Previous Medications   ACETAMINOPHEN (TYLENOL) 500 MG TABLET    Take 500 mg by mouth at  bedtime as needed for moderate pain.   AZELAIC ACID EX    Apply 1 application topically 2 (two) times daily as needed (rosacea). Azelaic Acid 10%  use right after metronidazole cream   BACLOFEN (LIORESAL) 10 MG TABLET    Take 10 mg by mouth at bedtime.   BIOTIN 5000 MCG TABS    Take 5,000 mcg by mouth daily.    BUTALBITAL-ACETAMINOPHEN-CAFFEINE (FIORICET, ESGIC) 50-325-40 MG TABLET    Take 1 tablet by mouth every 6 (six) hours as needed (muscle pain).   CALCIUM CITRATE-VITAMIN D (CALCIUM + D PO)  Take 1 tablet by mouth 3 (three) times daily.   CARBOXYMETHYLCELLULOSE SODIUM (THERATEARS) 0.25 % SOLN    Place 1 drop into both eyes 4 (four) times daily.   CHOLECALCIFEROL (VITAMIN D3) LIQD    Take 1,000 Units by mouth every Monday, Tuesday, Wednesday, Thursday, and Friday.   EVENING PRIMROSE OIL 1000 MG CAPS    Take 1,000 mg by mouth every evening.   FEMRING 0.05 MG/24HR RING    Place 1 each vaginally every 3 (three) months.   GABAPENTIN (NEURONTIN) 100 MG CAPSULE    Take 200 mg by mouth at bedtime.    LUTEIN 20 MG TABS    Take 20 mg by mouth daily.    MAGNESIUM 300 MG CAPS    Take 300 mg by mouth at bedtime.   MECLIZINE (ANTIVERT) 12.5 MG TABLET    Take 1 tablet (12.5 mg total) by mouth 3 (three) times daily as needed for dizziness.   METHOCARBAMOL (ROBAXIN) 750 MG TABLET    Take 750 mg by mouth every 6 (six) hours.   METOPROLOL SUCCINATE (TOPROL-XL) 25 MG 24 HR TABLET    Take 0.5 tablets (12.5 mg total) by mouth in the morning and at bedtime.   METRONIDAZOLE (METROGEL) 0.75 % GEL    Apply 1 application topically 2 (two) times daily as needed (rosacea).   MULTIPLE VITAMIN (MULTIVITAMIN WITH MINERALS) TABS TABLET    Take 1 tablet by mouth daily.   MULTIPLE VITAMINS-MINERALS (PRESERVISION AREDS 2) CAPS    Take 1 capsule by mouth 2 (two) times daily.   OMEGA-3 1000 MG CAPS    Take 1,000 mg by mouth 2 (two) times daily.   ONDANSETRON (ZOFRAN-ODT) 4 MG DISINTEGRATING TABLET    Take 1 tablet (4 mg total)  by mouth every 6 (six) hours as needed for nausea or vomiting.   RIMEGEPANT SULFATE (NURTEC) 75 MG TBDP    Take 75 mg by mouth daily as needed (migraine).   RIVAROXABAN (XARELTO) 20 MG TABS TABLET    Take 1 tablet (20 mg total) by mouth daily with supper.   SODIUM CHLORIDE 1 G TABLET    Take 1 tablet (1 g total) by mouth 2 (two) times daily with a meal.  Modified Medications   No medications on file  Discontinued Medications   IPRATROPIUM (ATROVENT) 0.06 % NASAL SPRAY    Place 2 sprays into both nostrils 4 (four) times daily as needed for rhinitis.   LEVOCETIRIZINE (XYZAL) 5 MG TABLET    Take 5 mg by mouth daily as needed for allergies.   SUMATRIPTAN (IMITREX) 50 MG TABLET    Take 1 tablet (50 mg total) by mouth every 2 (two) hours as needed for migraine. May repeat in 2 hours if headache persists or recurs.   VALACYCLOVIR (VALTREX) 1000 MG TABLET    Take 1 tablet (1,000 mg total) by mouth 2 (two) times daily.    Physical Exam:  Vitals:   08/18/20 1517  BP: 140/70  Temp: 97.8 F (36.6 C)  TempSrc: Temporal  Weight: 105 lb 3.2 oz (47.7 kg)  Height: 5\' 6"  (1.676 m)   Body mass index is 16.98 kg/m. Wt Readings from Last 3 Encounters:  08/18/20 105 lb 3.2 oz (47.7 kg)  07/15/20 109 lb 12.8 oz (49.8 kg)  06/17/20 106 lb (48.1 kg)    Physical Exam Constitutional:      General: She is not in acute distress.    Appearance: She is well-developed. She is not diaphoretic.  HENT:     Head: Normocephalic and atraumatic.     Mouth/Throat:     Pharynx: No oropharyngeal exudate.  Eyes:     Conjunctiva/sclera: Conjunctivae normal.     Pupils: Pupils are equal, round, and reactive to light.  Cardiovascular:     Rate and Rhythm: Normal rate and regular rhythm.     Heart sounds: Normal heart sounds.  Pulmonary:     Effort: Pulmonary effort is normal.     Breath sounds: Normal breath sounds.  Abdominal:     General: Bowel sounds are normal.     Palpations: Abdomen is soft.   Musculoskeletal:     Cervical back: Normal range of motion and neck supple.     Right lower leg: No edema.     Left lower leg: No edema.  Skin:    General: Skin is warm and dry.  Neurological:     Mental Status: She is alert.  Psychiatric:        Mood and Affect: Mood normal.    Labs reviewed: Basic Metabolic Panel: Recent Labs    04/01/20 1352 04/05/20 1726 04/06/20 0703 04/08/20 0707 04/16/20 1125 04/21/20 1519 04/26/20 0112 05/26/20 1514  NA 131*   < > 134*   < > 134* 132* 134* 134  K 4.0   < > 4.6   < > 4.0 5.0 3.8 4.5  CL 92*   < > 101   < > 99 94* 103 94*  CO2 29   < > 24   < > 24 30 24 26   GLUCOSE 122   < > 98   < > 138* 93 114* 92  BUN 17   < > 8   < > 14 16 15 18   CREATININE 0.59*   < > 0.48   < > 0.62 0.52* 0.57 0.59  CALCIUM 9.8   < > 9.0   < > 9.1 9.7 9.2 9.5  MG 2.2  --  2.2  --  2.4  --  2.3  --   TSH 3.76  --   --   --   --   --   --   --    < > = values in this interval not displayed.   Liver Function Tests: Recent Labs    04/01/20 1352  AST 21  ALT 15  BILITOT 0.5  PROT 7.2   No results for input(s): LIPASE, AMYLASE in the last 8760 hours. No results for input(s): AMMONIA in the last 8760 hours. CBC: Recent Labs    04/01/20 1352 04/05/20 1726 04/16/20 1125 04/26/20 0112 05/26/20 1514  WBC 7.7   < > 7.1 6.6 6.9  NEUTROABS 6,483  --  5.6  --  5.0  HGB 14.6   < > 14.6 13.1 14.1  HCT 43.8   < > 45.5 39.6 43.2  MCV 91.4   < > 92.1 91.2 92  PLT 262   < > 295 261 294   < > = values in this interval not displayed.   Lipid Panel: No results for input(s): CHOL, HDL, LDLCALC, TRIG, CHOLHDL, LDLDIRECT in the last 8760 hours. TSH: Recent Labs    04/01/20 1352  TSH 3.76   A1C: Lab Results  Component Value Date   HGBA1C 5.6 03/03/2011     Assessment/Plan 1. Chronic hyponatremia -she was started on sodium tablet during hospitalization. Will follow up today - BASIC METABOLIC PANEL WITH GFR  2. Left lower quadrant abdominal  pain -improved  since having BM but still tender. She question diverticulitis flare. Will have her monitor area and notify if symptoms worsen or do not improve. No fevers at this time.  - CBC with Differential/Platelet  3. Nausea -ongoing, had worsened due to constipation and dizziness.  - ondansetron (ZOFRAN-ODT) 4 MG disintegrating tablet; Take 1 tablet (4 mg total) by mouth every 6 (six) hours as needed for nausea or vomiting.  Dispense: 60 tablet; Refill: 1  4. Dizziness Thought to be a sequela from her chronic migraines, would like second opinion from neurologist in regards treatment options for migraines which could help dizziness.  - meclizine (ANTIVERT) 12.5 MG tablet; Take 1-2 tablets (12.5-25 mg total) by mouth 3 (three) times daily as needed for dizziness.  Dispense: 60 tablet; Refill: 0 - Ambulatory referral to Neurology  5. New onset atrial fibrillation [facility]) Following closely with cardiology. S/p ablation. Continues to have episodes of afib but short and without symptoms. Continues on xarelto for anticoagulation and metoprolol for rate control.    Next appt: 3 months.  Carlos American. [place], [facility] [phone]

## 2020-08-19 ENCOUNTER — Other Ambulatory Visit: Payer: Self-pay | Admitting: *Deleted

## 2020-08-19 DIAGNOSIS — E871 Hypo-osmolality and hyponatremia: Secondary | ICD-10-CM

## 2020-08-19 MED ORDER — SODIUM CHLORIDE 1 G PO TABS: 1.0000 g | ORAL_TABLET | Freq: Three times a day (TID) | ORAL | 1 refills | Status: DC

## 2020-08-19 NOTE — Telephone Encounter (Signed)
Patient notified and agreed.  Patient is requesting a refill on the Sodium Tablets because she is about out.  Pended and sent to [facility] for approval.   Scheduled an appointment for repeat labs next Friday. Placed order.

## 2020-08-19 NOTE — Telephone Encounter (Signed)
-----   Message from Lauree Chandler, NP sent at 08/19/2020  9:25 AM EDT ----- Sodium level is low possibly due to increase in bowel movements after constipation episodes. Would recommend increasing sodium tablet to three times daily over the next week and following up blood work in 1 week (bmp dx) hyponatremia. White count is normal- hopefully abdominal discomfort continues to improve.

## 2020-08-27 ENCOUNTER — Other Ambulatory Visit: Payer: Self-pay

## 2020-08-27 ENCOUNTER — Other Ambulatory Visit: Payer: Medicare Other

## 2020-08-27 DIAGNOSIS — E871 Hypo-osmolality and hyponatremia: Secondary | ICD-10-CM

## 2020-08-28 LAB — BASIC METABOLIC PANEL
BUN/Creatinine Ratio: 32 (calc) — ABNORMAL HIGH (ref 6–22)
BUN: 18 mg/dL (ref 7–25)
CO2: 29 mmol/L (ref 20–32)
Calcium: 9.2 mg/dL (ref 8.6–10.4)
Chloride: 95 mmol/L — ABNORMAL LOW (ref 98–110)
Creat: 0.57 mg/dL — ABNORMAL LOW (ref 0.60–0.93)
Glucose, Bld: 87 mg/dL (ref 65–139)
Potassium: 4.8 mmol/L (ref 3.5–5.3)
Sodium: 132 mmol/L — ABNORMAL LOW (ref 135–146)

## 2020-08-31 MED ORDER — SODIUM CHLORIDE 1 G PO TABS: 1.0000 g | ORAL_TABLET | Freq: Three times a day (TID) | ORAL | 1 refills | Status: DC

## 2020-09-01 ENCOUNTER — Other Ambulatory Visit: Payer: Self-pay

## 2020-09-01 DIAGNOSIS — E871 Hypo-osmolality and hyponatremia: Secondary | ICD-10-CM

## 2020-09-01 NOTE — Telephone Encounter (Signed)
This encounter was created in error - please disregard.

## 2020-09-06 DIAGNOSIS — Z20822 Contact with and (suspected) exposure to covid-19: Secondary | ICD-10-CM | POA: Diagnosis not present

## 2020-09-23 ENCOUNTER — Other Ambulatory Visit: Payer: Medicare Other

## 2020-09-23 ENCOUNTER — Other Ambulatory Visit: Payer: Self-pay

## 2020-09-23 DIAGNOSIS — E871 Hypo-osmolality and hyponatremia: Secondary | ICD-10-CM

## 2020-09-24 LAB — BASIC METABOLIC PANEL WITH GFR
BUN/Creatinine Ratio: 36 (calc) — ABNORMAL HIGH (ref 6–22)
BUN: 21 mg/dL (ref 7–25)
CO2: 30 mmol/L (ref 20–32)
Calcium: 9.1 mg/dL (ref 8.6–10.4)
Chloride: 96 mmol/L — ABNORMAL LOW (ref 98–110)
Creat: 0.59 mg/dL — ABNORMAL LOW (ref 0.60–1.00)
Glucose, Bld: 105 mg/dL (ref 65–139)
Potassium: 4.3 mmol/L (ref 3.5–5.3)
Sodium: 134 mmol/L — ABNORMAL LOW (ref 135–146)
eGFR: 94 mL/min/{1.73_m2} (ref >=60)

## 2020-09-29 ENCOUNTER — Encounter: Payer: Self-pay | Admitting: Internal Medicine

## 2020-09-29 ENCOUNTER — Ambulatory Visit (INDEPENDENT_AMBULATORY_CARE_PROVIDER_SITE_OTHER): Payer: Medicare Other | Admitting: Internal Medicine

## 2020-09-29 ENCOUNTER — Other Ambulatory Visit: Payer: Self-pay

## 2020-09-29 VITALS — BP 148/84 | HR 82 | Ht 66.0 in | Wt 108.8 lb

## 2020-09-29 DIAGNOSIS — I4891 Unspecified atrial fibrillation: Secondary | ICD-10-CM

## 2020-09-29 DIAGNOSIS — I480 Paroxysmal atrial fibrillation: Secondary | ICD-10-CM

## 2020-09-29 MED ORDER — METOPROLOL SUCCINATE ER 25 MG PO TB24: 25.0000 mg | ORAL_TABLET | Freq: Two times a day (BID) | ORAL | 3 refills | Status: DC

## 2020-09-29 NOTE — Patient Instructions (Addendum)
Medication Instructions:  Increase Metoprolol Succinate to 25 mg two times daily Your physician recommends that you continue on your current medications as directed. Please refer to the Current Medication list given to you today.  Labwork: None ordered.  Testing/Procedures: None ordered.  Follow-Up: Your physician wants you to follow-up in: 6 weeks in the Afib Clinic. They will contact you to schedule.    Any Other Special Instructions Will Be Listed Below (If Applicable).  If you need a refill on your cardiac medications before your next appointment, please call your pharmacy.

## 2020-09-29 NOTE — Progress Notes (Signed)
PCP: Lauree Chandler, NP Primary Cardiologist: previously Dr Vergia Alcon is a 75 y.o. female who presents today for routine electrophysiology followup.  Since his recent afib ablation, the patient reports doing very well.  she denies procedure related complications and is pleased with the results of the procedure.  She has ongoing palpitations which she attributes to afib.  These occur nearly daily. Today, she denies symptoms of  chest pain, shortness of breath,  lower extremity edema, dizziness, presyncope, or syncope.  The patient is otherwise without complaint today.   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   Chronic migraine without aura, intractable, without status migrainosus    Diverticulosis 2010   Endometriosis    Low sodium levels    Malnutrition (HCC)    MD (muscular dystrophy) ([place])    Meniere's disease, bilateral    [facility]   Peripheral neuropathy    Dr Erling Cruz   Pneumonia    Sensorineural hearing loss, bilateral    Vertigo 2019   Past Surgical History:  Procedure Laterality Date   ABDOMINAL HYSTERECTOMY  1980 or 1981   for Endometriosis   APPENDECTOMY  1965   high school; low grade appendiceal cancer   ATRIAL FIBRILLATION ABLATION N/A 06/17/2020   Procedure: ATRIAL FIBRILLATION ABLATION;  Surgeon: Thompson Grayer, MD;  Location: [facility];  Service: Cardiovascular;  Laterality: N/A;   BREAST ENHANCEMENT SURGERY Bilateral 1979   COLONOSCOPY  01/2012   Tics; Dr Teena Irani   ESI      X 3 in 2003 & 02/17/2011 for L4-S1 symptoms   LUMBAR LAMINECTOMY  12/2012   W-S , [place]   TONSILLECTOMY  1950 or [place]     vocal cords stripped  1970    ROS- all systems are personally reviewed and negatives except as per HPI above  Current Outpatient Medications  Medication Sig Dispense Refill   acetaminophen (TYLENOL) 500 MG tablet Take 500 mg by mouth at bedtime as  needed for moderate pain.     AZELAIC ACID EX Apply 1 application topically 2 (two) times daily as needed (rosacea). Azelaic Acid 10%  use right after metronidazole cream     baclofen (LIORESAL) 10 MG tablet Take 10 mg by mouth at bedtime.     Biotin 5000 MCG TABS Take 5,000 mcg by mouth daily.      butalbital-acetaminophen-caffeine (FIORICET, ESGIC) 50-325-40 MG tablet Take 1 tablet by mouth every 6 (six) hours as needed (muscle pain).     Calcium Citrate-Vitamin D (CALCIUM + D PO) Take 1 tablet by mouth 3 (three) times daily.     Carboxymethylcellulose Sodium (THERATEARS) 0.25 % SOLN Place 1 drop into both eyes 4 (four) times daily.     Cholecalciferol (VITAMIN D3) LIQD Take 1,000 Units by mouth every Monday, Tuesday, Wednesday, Thursday, and Friday.     Evening Primrose Oil 1000 MG CAPS Take 1,000 mg by mouth every evening.     FEMRING 0.05 MG/24HR RING Place 1 each vaginally every 3 (three) months.     gabapentin (NEURONTIN) 100 MG capsule Take 200 mg by mouth at bedtime.      Lutein 20 MG TABS Take 20 mg by mouth daily.      Magnesium 300 MG CAPS Take 300 mg by mouth at bedtime.     meclizine (ANTIVERT) 12.5 MG tablet Take  1-2 tablets (12.5-25 mg total) by mouth 3 (three) times daily as needed for dizziness. 60 tablet 0   methocarbamol (ROBAXIN) 750 MG tablet Take 750 mg by mouth every 6 (six) hours.     metroNIDAZOLE (METROGEL) 0.75 % gel Apply 1 application topically 2 (two) times daily as needed (rosacea).     Multiple Vitamin (MULTIVITAMIN WITH MINERALS) TABS tablet Take 1 tablet by mouth daily.     Multiple Vitamins-Minerals (PRESERVISION AREDS 2) CAPS Take 1 capsule by mouth 2 (two) times daily.     Omega-3 1000 MG CAPS Take 1,000 mg by mouth 2 (two) times daily.     ondansetron (ZOFRAN-ODT) 4 MG disintegrating tablet Take 1 tablet (4 mg total) by mouth every 6 (six) hours as needed for nausea or vomiting. 60 tablet 1   Rimegepant Sulfate (NURTEC) 75 MG TBDP Take 75 mg by mouth daily  as needed (migraine).     rivaroxaban (XARELTO) 20 MG TABS tablet Take 1 tablet (20 mg total) by mouth daily with supper. 90 tablet 1   sodium chloride 1 g tablet Take 1 tablet (1 g total) by mouth 3 (three) times daily with meals. 90 tablet 1   metoprolol succinate (TOPROL-XL) 25 MG 24 hr tablet Take 0.5 tablets (12.5 mg total) by mouth in the morning and at bedtime. 30 tablet 3   No current facility-administered medications for this visit.    Physical Exam: Vitals:   09/29/20 1206  BP: (!) 148/84  Pulse: 82  SpO2: 96%  Weight: 108 lb 12.8 oz (49.4 kg)  Height: '5\' 6"'$  (1.676 m)    GEN- The patient is anxious appearing, alert and oriented x 3 today.   Head- normocephalic, atraumatic Eyes-  Sclera clear, conjunctiva pink Ears- hearing intact Oropharynx- clear Lungs- Clear to ausculation bilaterally, normal work of breathing Heart- Regular rate and rhythm, no murmurs, rubs or gallops, PMI not laterally displaced GI- soft, NT, ND, + BS Extremities- no clubbing, cyanosis, or edema  EKG tracing ordered today is personally reviewed and shows sinus  Assessment and Plan:  1. Paroxysmal atrial fibrillation/ atrial flutter Continues to have palpitations post ablation.  I have advised ILR implantation to further evaluate.  She declines but will further consider.  She has an allergy to surface electrodes and is very clear that she does not wish to have further short term monitoring. chads2vasc score is 3.  Continue xarelto Increase toprol to '25mg'$  BID. We discussed options of flecainide, tikosyn, and amiodarone.  She reports that she has previously tried flecainide and did not tolerate this.  She is clear that she could not do a 3 day hospitalization for tikosyn due to concerns for skin irritation from telemetry. She may be willing to consider amiodarone if her AF continues.  I do not think that repeat ablation would be a good idea at this time. We could consider referral to [facility] for  convergent procedure but I would favor exhausting our AAD options first Very difficult situation.  A high level of decision making and extensive conversation with patient and spouse were required for the visit today.  Follow-up in AF clinic in 6 weeks for further discussion.  Thompson Grayer MD, [facility] 09/29/2020 12:25 PM

## 2020-10-04 DIAGNOSIS — R42 Dizziness and giddiness: Secondary | ICD-10-CM | POA: Diagnosis not present

## 2020-10-04 DIAGNOSIS — I2510 Atherosclerotic heart disease of native coronary artery without angina pectoris: Secondary | ICD-10-CM | POA: Diagnosis not present

## 2020-10-04 DIAGNOSIS — G43111 Migraine with aura, intractable, with status migrainosus: Secondary | ICD-10-CM | POA: Diagnosis not present

## 2020-10-05 DIAGNOSIS — Z20822 Contact with and (suspected) exposure to covid-19: Secondary | ICD-10-CM | POA: Diagnosis not present

## 2020-10-06 ENCOUNTER — Other Ambulatory Visit (HOSPITAL_COMMUNITY): Payer: Self-pay | Admitting: Physician Assistant

## 2020-10-06 DIAGNOSIS — L820 Inflamed seborrheic keratosis: Secondary | ICD-10-CM | POA: Diagnosis not present

## 2020-10-06 DIAGNOSIS — D485 Neoplasm of uncertain behavior of skin: Secondary | ICD-10-CM | POA: Diagnosis not present

## 2020-10-06 DIAGNOSIS — L821 Other seborrheic keratosis: Secondary | ICD-10-CM | POA: Diagnosis not present

## 2020-10-06 DIAGNOSIS — L565 Disseminated superficial actinic porokeratosis (DSAP): Secondary | ICD-10-CM | POA: Diagnosis not present

## 2020-10-06 DIAGNOSIS — L570 Actinic keratosis: Secondary | ICD-10-CM | POA: Diagnosis not present

## 2020-10-06 DIAGNOSIS — Z85828 Personal history of other malignant neoplasm of skin: Secondary | ICD-10-CM | POA: Diagnosis not present

## 2020-10-06 NOTE — Telephone Encounter (Signed)
Prescription refill request for Xarelto received.  Indication:afib Last office visit: allred 07/15/20 Weight:49.4kg Age:58fScr:0.59 CrCl:64.3

## 2020-10-18 DIAGNOSIS — U071 COVID-19: Secondary | ICD-10-CM | POA: Diagnosis not present

## 2020-10-19 ENCOUNTER — Telehealth (HOSPITAL_COMMUNITY): Payer: Self-pay

## 2020-10-19 NOTE — Telephone Encounter (Signed)
Patient called and states she has been diagnosed with Covid-19. She wanted to know if Z pack, Prednisone shot and Paxlovid if they would interfere with her Xarelto and Metoprolol medication. Consulted with Roderic Palau NP and patient was told not to take Paxlovid with Xarelto. Communicated with patient steroids may increase the risk for A-Fib. Consulted with patient and she verbalized understanding.

## 2020-10-28 DIAGNOSIS — U071 COVID-19: Secondary | ICD-10-CM | POA: Diagnosis not present

## 2020-10-28 DIAGNOSIS — E871 Hypo-osmolality and hyponatremia: Secondary | ICD-10-CM | POA: Diagnosis not present

## 2020-10-29 ENCOUNTER — Telehealth: Payer: Self-pay | Admitting: *Deleted

## 2020-10-29 NOTE — Telephone Encounter (Signed)
Patient called and stated that she has been out of town in the [place] since the end of July.  Stated that she tested Positive for Covid 10/17/2020 and was seen at Urgent Care recently due to worsening symptoms.  Stated that Urgent Care did labwork on her and her Sodium was 129.  Alicia [name] had patient on 3 (1gm) salt tablets daily and the Urgent Care Dr. Rockey Situ her to Increase it to 4 tablets daily. Stated that her blood pressure at the Urgent care was 178/76 and she is worried about taking the extra tablets unless Alicia [name] states it is ok.    Please Advise. (Sent to [facility] due to Alicia [name] out of office)

## 2020-11-01 ENCOUNTER — Other Ambulatory Visit (HOSPITAL_COMMUNITY): Payer: Self-pay | Admitting: *Deleted

## 2020-11-01 MED ORDER — METOPROLOL SUCCINATE ER 25 MG PO TB24: 25.0000 mg | ORAL_TABLET | Freq: Two times a day (BID) | ORAL | 3 refills | Status: DC

## 2020-11-01 NOTE — Telephone Encounter (Signed)
Forwarded to Evie, in Clinical intake.

## 2020-11-01 NOTE — Telephone Encounter (Signed)
Yeah it sounds like she needs a follow up in office to evaluate blood pressure and sodium levels.

## 2020-11-01 NOTE — Telephone Encounter (Signed)
Patient scheduled for 11/05/20 with Sherrie Mustache, NP as patient prefers to see Janett Billow.

## 2020-11-05 ENCOUNTER — Encounter: Payer: Self-pay | Admitting: Nurse Practitioner

## 2020-11-05 ENCOUNTER — Ambulatory Visit (INDEPENDENT_AMBULATORY_CARE_PROVIDER_SITE_OTHER): Payer: Medicare Other | Admitting: Nurse Practitioner

## 2020-11-05 ENCOUNTER — Other Ambulatory Visit: Payer: Self-pay

## 2020-11-05 VITALS — BP 124/80 | HR 77 | Temp 98.2°F | Resp 16 | Ht 66.0 in | Wt 110.0 lb

## 2020-11-05 DIAGNOSIS — R42 Dizziness and giddiness: Secondary | ICD-10-CM

## 2020-11-05 DIAGNOSIS — U099 Post covid-19 condition, unspecified: Secondary | ICD-10-CM

## 2020-11-05 DIAGNOSIS — E871 Hypo-osmolality and hyponatremia: Secondary | ICD-10-CM

## 2020-11-05 DIAGNOSIS — G43111 Migraine with aura, intractable, with status migrainosus: Secondary | ICD-10-CM

## 2020-11-05 MED ORDER — BUTALBITAL-APAP-CAFFEINE 50-325-40 MG PO TABS: 1.0000 | ORAL_TABLET | Freq: Four times a day (QID) | ORAL | 0 refills | Status: DC | PRN

## 2020-11-05 NOTE — Progress Notes (Signed)
Careteam: Patient Care Team: Lauree Chandler, NP as PCP - General (Geriatric Medicine) Deliah Goody, PA-C as Consulting Physician (Physician Assistant) Danella Sensing, MD as Consulting Physician (Dermatology) Everett Graff, MD as Consulting Physician (Obstetrics and Gynecology) Vicie Mutters, MD as Consulting Physician (Otolaryngology) Marshell Garfinkel, MD as Consulting Physician (Pulmonary Disease) Gayla Medicus, MD as Referring Physician (Neurology)  PLACE OF SERVICE:  [place] Directive information Does Patient Have a Medical Advance Directive?: No, Would patient like information on creating a medical advance directive?: No - Patient declined  Allergies  Allergen Reactions   Biaxin [Clarithromycin] Anaphylaxis   Sulfonamide Derivatives Itching and Rash   Tequin [Gatifloxacin] Anaphylaxis   Demerol Nausea And Vomiting   Lactose Intolerance (Gi) Other (See Comments)    Mild abdominal pain, gas, bloating   Nsaids Other (See Comments)    GI upset/stomach pain   Risedronate Sodium Nausea And Vomiting and Other (See Comments)     GI upset/heartburn    Rofecoxib Nausea And Vomiting and Other (See Comments)    GI upset/heartburn    Alendronate Sodium Nausea And Vomiting   Aspirin Other (See Comments)    Abdominal pain    Cardizem [Diltiazem Hcl] Hives   Eliquis [Apixaban] Hives   Fish Allergy Nausea And Vomiting    Can take fish oil   Benadryl [Diphenhydramine Hcl] Palpitations   Other Itching and Rash    EKG LEADS caused burning sores     Chief Complaint  Patient presents with   Follow-up    Reevaluate Blood pressure and sodium levels.      HPI: Patient is a 75 y.o. female for follow up  Has lost more hearing with COVID vaccine and COVID.   Husband had a mild case but she got a bad case since Aug 14th. Getting her appetite back.  Has not slept well. Got up and down all night.  Continues to have headaches - she has had bad headaches that are only  responsive to Fioricet but she needs Rx.   Blood pressure looks good today- she has not checked since she has been out of town.   Reports she had a throat infection and COVID- she had a home visit and gave her antibiotic and steroid She tried to get infusion but could not get in anywhere.    Could not take antiviral due to xarelto.   Her sodium was low when she was sick and she was instructed to increase sodium tablet to 4 times daily. Reports it was at 129. She did not increase.    Review of Systems:  Review of Systems  Constitutional:  Positive for malaise/fatigue. Negative for chills, fever and weight loss.  HENT:  Negative for tinnitus.   Respiratory:  Negative for cough, sputum production and shortness of breath.   Cardiovascular:  Negative for chest pain, palpitations and leg swelling.  Gastrointestinal:  Negative for abdominal pain, constipation, diarrhea and heartburn.  Genitourinary:  Negative for dysuria, frequency and urgency.  Musculoskeletal:  Positive for myalgias. Negative for back pain, falls and joint pain.  Skin: Negative.   Neurological:  Positive for dizziness, weakness and headaches.  Psychiatric/Behavioral:  Negative for depression and memory loss. The patient does not have insomnia.    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   Chronic migraine without aura, intractable, without status migrainosus    Diverticulosis 2010   Endometriosis    Low  sodium levels    Malnutrition ([place])    MD (muscular dystrophy) ([place])    Meniere's disease, bilateral    [facility]   Peripheral neuropathy    Dr Erling Cruz   Pneumonia    Sensorineural hearing loss, bilateral    Vertigo 2019   Past Surgical History:  Procedure Laterality Date   ABDOMINAL HYSTERECTOMY  1980 or 1981   for Endometriosis   APPENDECTOMY  1965   high school; low grade appendiceal cancer   ATRIAL FIBRILLATION ABLATION N/A 06/17/2020    Procedure: ATRIAL FIBRILLATION ABLATION;  Surgeon: Thompson Grayer, MD;  Location: [facility];  Service: Cardiovascular;  Laterality: N/A;   BREAST ENHANCEMENT SURGERY Bilateral 1979   COLONOSCOPY  01/2012   Tics; Dr Teena Irani   ESI      X 3 in 2003 & 02/17/2011 for L4-S1 symptoms   LUMBAR LAMINECTOMY  12/2012   W-S , [place]   TONSILLECTOMY  1950 or [place]     vocal cords stripped  1970   Social History:   reports that she has never smoked. She has never used smokeless tobacco. She reports that she does not currently use alcohol. She reports that she does not use drugs.  Family History  Problem Relation Age of Onset   Alcohol abuse Mother    Hypertension Mother    Diabetes Mother 42   Stroke Mother 53   Dementia Mother    Kidney failure Mother    Osteoporosis Mother    Alzheimer's disease Father 43   Cancer Maternal Aunt 78       pancreatic   Arthritis Sister 20       knee replacement   Hyperlipidemia Sister    Lung disease Maternal Grandmother    Lung disease Maternal Grandfather    Hyperlipidemia Sister 84   Other Son        recent back injury   Other Daughter        recent back injury; knee issues when playing tennis   Heart disease Neg Hx    Heart attack Neg Hx     Medications: Patient's Medications  New Prescriptions   No medications on file  Previous Medications   ACETAMINOPHEN (TYLENOL) 500 MG TABLET    Take 500 mg by mouth at bedtime as needed for moderate pain.   AZELAIC ACID EX    Apply 1 application topically 2 (two) times daily as needed (rosacea). Azelaic Acid 10%  use right after metronidazole cream   BACLOFEN (LIORESAL) 10 MG TABLET    Take 10 mg by mouth at bedtime.   BIOTIN 5000 MCG TABS    Take 5,000 mcg by mouth daily.    BUTALBITAL-ACETAMINOPHEN-CAFFEINE (FIORICET, ESGIC) 50-325-40 MG TABLET    Take 1 tablet by mouth every 6 (six) hours as needed (muscle pain).   CALCIUM CITRATE-VITAMIN D (CALCIUM + D PO)    Take 1 tablet by  mouth 3 (three) times daily.   CARBOXYMETHYLCELLULOSE SODIUM (THERATEARS) 0.25 % SOLN    Place 1 drop into both eyes 4 (four) times daily.   CHOLECALCIFEROL (VITAMIN D3) LIQD    Take 1,000 Units by mouth every Monday, Tuesday, Wednesday, Thursday, and Friday.   EVENING PRIMROSE OIL 1000 MG CAPS    Take 1,000 mg by mouth every evening.   FEMRING 0.05 MG/24HR RING    Place 1 each vaginally every 3 (three) months.   GABAPENTIN (NEURONTIN) 100 MG  CAPSULE    Take 200 mg by mouth at bedtime.    LUTEIN 20 MG TABS    Take 20 mg by mouth daily.    MAGNESIUM 300 MG CAPS    Take 300 mg by mouth at bedtime.   MECLIZINE (ANTIVERT) 12.5 MG TABLET    Take 1-2 tablets (12.5-25 mg total) by mouth 3 (three) times daily as needed for dizziness.   METHOCARBAMOL (ROBAXIN) 750 MG TABLET    Take 750 mg by mouth every 6 (six) hours.   METOPROLOL SUCCINATE (TOPROL XL) 25 MG 24 HR TABLET    Take 1 tablet (25 mg total) by mouth in the morning and at bedtime.   METRONIDAZOLE (METROGEL) 0.75 % GEL    Apply 1 application topically 2 (two) times daily as needed (rosacea).   MULTIPLE VITAMIN (MULTIVITAMIN WITH MINERALS) TABS TABLET    Take 1 tablet by mouth daily.   MULTIPLE VITAMINS-MINERALS (PRESERVISION AREDS 2) CAPS    Take 1 capsule by mouth 2 (two) times daily.   OMEGA-3 1000 MG CAPS    Take 1,000 mg by mouth 2 (two) times daily.   ONDANSETRON (ZOFRAN-ODT) 4 MG DISINTEGRATING TABLET    Take 1 tablet (4 mg total) by mouth every 6 (six) hours as needed for nausea or vomiting.   RIMEGEPANT SULFATE (NURTEC) 75 MG TBDP    Take 75 mg by mouth daily as needed (migraine).   RIVAROXABAN (XARELTO) 20 MG TABS TABLET    TAKE 1 TABLET DAILY WITH SUPPER   SODIUM CHLORIDE 1 G TABLET    Take 1 tablet (1 g total) by mouth 3 (three) times daily with meals.   VITAMIN C (ASCORBIC ACID) 500 MG TABLET    Take 500 mg by mouth 2 (two) times daily.   ZINC 50 MG CAPS    Take 1 capsule by mouth 2 (two) times daily.  Modified Medications   No  medications on file  Discontinued Medications   No medications on file    Physical Exam:  Vitals:   11/05/20 1535  BP: 124/80  Pulse: 77  Resp: 16  Temp: 98.2 F (36.8 C)  SpO2: 98%  Weight: 110 lb (49.9 kg)  Height: _0  (1.676 m)   Body mass index is 17.75 kg/m. Wt Readings from Last 3 Encounters:  11/05/20 110 lb (49.9 kg)  09/29/20 108 lb 12.8 oz (49.4 kg)  08/18/20 105 lb 3.2 oz (47.7 kg)    Physical Exam Constitutional:      General: She is not in acute distress.    Appearance: She is well-developed. She is not diaphoretic.  HENT:     Head: Normocephalic and atraumatic.     Mouth/Throat:     Pharynx: No oropharyngeal exudate.  Eyes:     Conjunctiva/sclera: Conjunctivae normal.     Pupils: Pupils are equal, round, and reactive to light.  Cardiovascular:     Rate and Rhythm: Normal rate and regular rhythm.     Heart sounds: Normal heart sounds.  Pulmonary:     Effort: Pulmonary effort is normal.     Breath sounds: Normal breath sounds.  Abdominal:     General: Bowel sounds are normal.     Palpations: Abdomen is soft.  Musculoskeletal:     Cervical back: Normal range of motion and neck supple.     Right lower leg: No edema.     Left lower leg: No edema.  Skin:    General: Skin is warm and dry.  Neurological:     Mental Status: She is alert.  Psychiatric:        Mood and Affect: Mood normal.    Labs reviewed: Basic Metabolic Panel: Recent Labs    04/01/20 1352 04/05/20 1726 04/06/20 0703 04/08/20 0707 04/16/20 1125 04/21/20 1519 04/26/20 0112 05/26/20 1514 08/18/20 1549 08/27/20 1415 09/23/20 1506  NA 131*   < > 134*   < > 134*   < > 134*   < > 129* 132* 134*  K 4.0   < > 4.6   < > 4.0   < > 3.8   < > 4.7 4.8 4.3  CL 92*   < > 101   < > 99   < > 103   < > 92* 95* 96*  CO2 29   < > 24   < > 24   < > 24   < > _0 GLUCOSE 122   < > 98   < > 138*   < > 114*   < > 111 87 105  BUN 17   < > 8   < > 14   < > 15   < > _1 CREATININE  0.59*   < > 0.48   < > 0.62   < > 0.57   < > 0.52* 0.57* 0.59*  CALCIUM 9.8   < > 9.0   < > 9.1   < > 9.2   < > 9.6 9.2 9.1  MG 2.2  --  2.2  --  2.4  --  2.3  --   --   --   --   TSH 3.76  --   --   --   --   --   --   --   --   --   --    < > = values in this interval not displayed.   Liver Function Tests: Recent Labs    04/01/20 1352  AST 21  ALT 15  BILITOT 0.5  PROT 7.2   No results for input(s): LIPASE, AMYLASE in the last 8760 hours. No results for input(s): AMMONIA in the last 8760 hours. CBC: Recent Labs    04/16/20 1125 04/26/20 0112 05/26/20 1514 08/18/20 1549  WBC 7.1 6.6 6.9 4.8  NEUTROABS 5.6  --  5.0 3,802  HGB 14.6 13.1 14.1 13.4  HCT 45.5 39.6 43.2 40.2  MCV 92.1 91.2 92 92.0  PLT 295 261 294 308   Lipid Panel: No results for input(s): CHOL, HDL, LDLCALC, TRIG, CHOLHDL, LDLDIRECT in the last 8760 hours. TSH: Recent Labs    04/01/20 1352  TSH 3.76   A1C: Lab Results  Component Value Date   HGBA1C 5.6 03/03/2011     Assessment/Plan 1. Chronic hyponatremia -continues with chronic low sodium, continues on sodium tablets 1 gm TID. Will follow up sodium level at this time as was low with her acute illness. - CMP with eGFR([facility])  2. Dizziness -ongoing, continues with lifestyle modifications.    3. Post-COVID syndrome -slowly improving. Continues with fatigue at this time. Low appetite has improved.  - CBC with Differential/Platelet - CMP with eGFR([facility])  4. Intractable migraine with aura with status migrainosus - butalbital-acetaminophen-caffeine (FIORICET) 50-325-40 MG tablet; Take 1 tablet by mouth every 6 (six) hours as needed (muscle pain).  Dispense: 14 tablet; Refill: 0   Next appt: 11/19/2020 Carlos American. [place], [facility] [phone]

## 2020-11-06 LAB — COMPLETE METABOLIC PANEL WITH GFR
AG Ratio: 1.7 (calc) (ref 1.0–2.5)
ALT: 14 U/L (ref 6–29)
AST: 19 U/L (ref 10–35)
Albumin: 4.2 g/dL (ref 3.6–5.1)
Alkaline phosphatase (APISO): 65 U/L (ref 37–153)
BUN/Creatinine Ratio: 29 (calc) — ABNORMAL HIGH (ref 6–22)
BUN: 15 mg/dL (ref 7–25)
CO2: 28 mmol/L (ref 20–32)
Calcium: 9.5 mg/dL (ref 8.6–10.4)
Chloride: 96 mmol/L — ABNORMAL LOW (ref 98–110)
Creat: 0.52 mg/dL — ABNORMAL LOW (ref 0.60–1.00)
Globulin: 2.5 g/dL (calc) (ref 1.9–3.7)
Glucose, Bld: 101 mg/dL (ref 65–139)
Potassium: 4.7 mmol/L (ref 3.5–5.3)
Sodium: 131 mmol/L — ABNORMAL LOW (ref 135–146)
Total Bilirubin: 0.4 mg/dL (ref 0.2–1.2)
Total Protein: 6.7 g/dL (ref 6.1–8.1)
eGFR: 97 mL/min/{1.73_m2} (ref >=60)

## 2020-11-06 LAB — CBC WITH DIFFERENTIAL/PLATELET
Absolute Monocytes: 437 cells/uL (ref 200–950)
Basophils Absolute: 18 cells/uL (ref 0–200)
Basophils Relative: 0.3 %
Eosinophils Absolute: 41 cells/uL (ref 15–500)
Eosinophils Relative: 0.7 %
HCT: 39.2 % (ref 35.0–45.0)
Hemoglobin: 13.2 g/dL (ref 11.7–15.5)
Lymphs Abs: 448 cells/uL — ABNORMAL LOW (ref 850–3900)
MCH: 30.7 pg (ref 27.0–33.0)
MCHC: 33.7 g/dL (ref 32.0–36.0)
MCV: 91.2 fL (ref 80.0–100.0)
MPV: 10.5 fL (ref 7.5–12.5)
Monocytes Relative: 7.4 %
Neutro Abs: 4956 cells/uL (ref 1500–7800)
Neutrophils Relative %: 84 %
Platelets: 312 10*3/uL (ref 140–400)
RBC: 4.3 10*6/uL (ref 3.80–5.10)
RDW: 12.9 % (ref 11.0–15.0)
Total Lymphocyte: 7.6 %
WBC: 5.9 10*3/uL (ref 3.8–10.8)

## 2020-11-10 ENCOUNTER — Telehealth: Payer: Self-pay | Admitting: Nurse Practitioner

## 2020-11-10 NOTE — Telephone Encounter (Signed)
Noted  

## 2020-11-10 NOTE — Telephone Encounter (Signed)
Patient called and stated she saw her lab results on MyChart and she did not need to discuss them unless there was something that was wrong with them. So she said if something is wrong they can call her back but she has already seen her results on MyChart.

## 2020-11-19 ENCOUNTER — Ambulatory Visit: Payer: Self-pay | Admitting: Nurse Practitioner

## 2020-11-24 ENCOUNTER — Ambulatory Visit (HOSPITAL_COMMUNITY)
Admission: RE | Admit: 2020-11-24 | Discharge: 2020-11-24 | Disposition: A | Discharge status: Home / Self Care | Payer: Medicare Other | Source: Ambulatory Visit | Attending: Physician Assistant | Admitting: Physician Assistant

## 2020-11-24 ENCOUNTER — Other Ambulatory Visit: Payer: Self-pay

## 2020-11-24 ENCOUNTER — Encounter (HOSPITAL_COMMUNITY): Payer: Self-pay | Admitting: Physician Assistant

## 2020-11-24 VITALS — BP 122/78 | HR 70 | Ht 66.0 in | Wt 110.6 lb

## 2020-11-24 DIAGNOSIS — Z8249 Family history of ischemic heart disease and other diseases of the circulatory system: Secondary | ICD-10-CM | POA: Diagnosis not present

## 2020-11-24 DIAGNOSIS — Z882 Allergy status to sulfonamides status: Secondary | ICD-10-CM | POA: Diagnosis not present

## 2020-11-24 DIAGNOSIS — H8109 Meniere's disease, unspecified ear: Secondary | ICD-10-CM | POA: Diagnosis not present

## 2020-11-24 DIAGNOSIS — I4892 Unspecified atrial flutter: Secondary | ICD-10-CM | POA: Insufficient documentation

## 2020-11-24 DIAGNOSIS — Z7901 Long term (current) use of anticoagulants: Secondary | ICD-10-CM | POA: Insufficient documentation

## 2020-11-24 DIAGNOSIS — D6869 Other thrombophilia: Secondary | ICD-10-CM | POA: Diagnosis not present

## 2020-11-24 DIAGNOSIS — H9190 Unspecified hearing loss, unspecified ear: Secondary | ICD-10-CM | POA: Diagnosis not present

## 2020-11-24 DIAGNOSIS — Z79899 Other long term (current) drug therapy: Secondary | ICD-10-CM | POA: Insufficient documentation

## 2020-11-24 DIAGNOSIS — Z881 Allergy status to other antibiotic agents status: Secondary | ICD-10-CM | POA: Insufficient documentation

## 2020-11-24 DIAGNOSIS — I480 Paroxysmal atrial fibrillation: Secondary | ICD-10-CM | POA: Diagnosis not present

## 2020-11-24 DIAGNOSIS — G7100 Muscular dystrophy, unspecified: Secondary | ICD-10-CM | POA: Diagnosis not present

## 2020-11-24 DIAGNOSIS — Z888 Allergy status to other drugs, medicaments and biological substances status: Secondary | ICD-10-CM | POA: Insufficient documentation

## 2020-11-24 DIAGNOSIS — Z886 Allergy status to analgesic agent status: Secondary | ICD-10-CM | POA: Insufficient documentation

## 2020-11-24 DIAGNOSIS — Z885 Allergy status to narcotic agent status: Secondary | ICD-10-CM | POA: Diagnosis not present

## 2020-11-24 NOTE — Progress Notes (Signed)
Primary Care Physician: Lauree Chandler, NP Primary Electrophysiologist: Dr Rayann Heman Referring Physician: [facility]   Alicia [name] is a 75 y.o. female with a history of chronic hyponatremia, carotid artery disease, meniere's disease, muscular dystrophy, deafness, atrial fibrillation who presents for follow up in the [facility]. The patient was initially diagnosed with atrial fibrillation 04/06/20 after presenting with symptoms of palpitations and SOB. Due to ongoing symptoms, she was seen by provider at [facility]. She was in sinus rhythm at that time by EKG and sent home.  That night she started to having worsening shortness of breath, palpitations, chest pressure, and fatigue.  Due to persistent symptoms she came to ER for further evaluation.  She was in sinus rhythm on initial evaluation however while in triage waiting for bed she started to having palpitation again.  She was found to have atrial fibrillation with rapid ventricular rate IV Cardizem and then started on drip. Symptoms improved and she eventually converted to sinus rhythm this morning.  Patient reports longstanding history of intermittent palpitation lasting for few minutes. She was started on diltiazem for rate control and Eliquis for stroke prevention. She developed a pruritic rash and theses medications were changed to Toprol and Xarelto. She has a CHADS2VASC score of 3. She was seen at the ED two additional times and flecainide was started 04/26/20. Patient is s/p afib and flutter ablation with Dr Rayann Heman on 06/17/20.    On follow up today, patient reports that she has daily palpitations. Usually, these are brief but once she did have 24 hours of palpitations. She does have SOB associated with the palpitations. She denies any bleeding issues on anticoagulation.   Today, she denies symptoms of chest pain, orthopnea, PND, lower extremity edema, presyncope, syncope, snoring, daytime  somnolence, bleeding, or neurologic sequela. The patient is tolerating medications without difficulties and is otherwise without complaint today.    Atrial Fibrillation Risk Factors:  she does not have symptoms or diagnosis of sleep apnea. she does not have a history of rheumatic fever. she does not have a history of alcohol use.   she has a BMI of Body mass index is 17.85 kg/m.Marland Kitchen Filed Weights   11/24/20 1513  Weight: 50.2 kg     Family History  Problem Relation Age of Onset   Alcohol abuse Mother    Hypertension Mother    Diabetes Mother 49   Stroke Mother 51   Dementia Mother    Kidney failure Mother    Osteoporosis Mother    Alzheimer's disease Father 66   Cancer Maternal Aunt 48       pancreatic   Arthritis Sister 26       knee replacement   Hyperlipidemia Sister    Lung disease Maternal Grandmother    Lung disease Maternal Grandfather    Hyperlipidemia Sister 91   Other Son        recent back injury   Other Daughter        recent back injury; knee issues when playing tennis   Heart disease Neg Hx    Heart attack Neg Hx      Atrial Fibrillation Management history:  Previous antiarrhythmic drugs: flecainide Previous cardioversions: none Previous ablations: 06/17/20 CHADS2VASC score: 3 Anticoagulation history: Eliquis (rash), Xarelto    Past Medical History:  Diagnosis Date   Bronchitis    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   Chronic migraine without aura, intractable, without status migrainosus  Diverticulosis 2010   Endometriosis    Low sodium levels    Malnutrition ([place])    MD (muscular dystrophy) ([place])    Meniere's disease, bilateral    [facility]ste[facility]    [facility]   Peripheral neuropathy    Dr Erling Cruz   Pneumonia    Sensorineural hearing loss, bilateral    Vertigo 2019   Past Surgical History:  Procedure Laterality Date   ABDOMINAL HYSTERECTOMY  1980 or 1981   for Endometriosis   APPENDECTOMY  1965   high school; low grade  appendiceal cancer   ATRIAL FIBRILLATION ABLATION N/A 06/17/2020   Procedure: ATRIAL FIBRILLATION ABLATION;  Surgeon: Thompson Grayer, MD;  Location: [facility];  Service: Cardiovascular;  Laterality: N/A;   BREAST ENHANCEMENT SURGERY Bilateral 1979   COLONOSCOPY  01/2012   Tics; Dr Teena Irani   ESI      X 3 in 2003 & 02/17/2011 for L4-S1 symptoms   LUMBAR LAMINECTOMY  12/2012   [facility]-S , [place]   TONSILLECTOMY  1950 or 1951   TUBAL LIGATION     vocal cords stripped  1970    Current Outpatient Medications  Medication Sig Dispense Refill   acetaminophen (TYLENOL) 500 MG tablet Take 500 mg by mouth at bedtime as needed for moderate pain.     AZELAIC ACID EX Apply 1 application topically 2 (two) times daily as needed (rosacea). Azelaic Acid 10%  use right after metronidazole cream     baclofen (LIORESAL) 10 MG tablet Take 10 mg by mouth at bedtime.     Biotin 5000 MCG TABS Take 5,000 mcg by mouth daily.      butalbital-acetaminophen-caffeine (FIORICET) 50-325-40 MG tablet Take 1 tablet by mouth every 6 (six) hours as needed (muscle pain). 14 tablet 0   Calcium Citrate-Vitamin D (CALCIUM + D PO) Take 1 tablet by mouth 3 (three) times daily.     Carboxymethylcellulose Sodium (THERATEARS) 0.25 % SOLN Place 1 drop into both eyes 4 (four) times daily.     Cholecalciferol (VITAMIN D3) LIQD Take 1,000 Units by mouth every Monday, Tuesday, Wednesday, Thursday, and Friday.     Evening Primrose Oil 1000 MG CAPS Take 1,000 mg by mouth every evening.     FEMRING 0.05 MG/24HR RING Place 1 each vaginally every 3 (three) months.     gabapentin (NEURONTIN) 100 MG capsule Take 200 mg by mouth at bedtime.      Lutein 20 MG TABS Take 20 mg by mouth daily.      Magnesium 300 MG CAPS Take 300 mg by mouth at bedtime.     meclizine (ANTIVERT) 12.5 MG tablet Take 1-2 tablets (12.5-25 mg total) by mouth 3 (three) times daily as needed for dizziness. 60 tablet 0   methocarbamol (ROBAXIN) 750 MG tablet Take 750 mg by  mouth every 6 (six) hours.     metoprolol succinate (TOPROL XL) 25 MG 24 hr tablet Take 1 tablet (25 mg total) by mouth in the morning and at bedtime. 180 tablet 3   metroNIDAZOLE (METROGEL) 0.75 % gel Apply 1 application topically 2 (two) times daily as needed (rosacea).     Multiple Vitamin (MULTIVITAMIN WITH MINERALS) TABS tablet Take 1 tablet by mouth daily.     Multiple Vitamins-Minerals (PRESERVISION AREDS 2) CAPS Take 1 capsule by mouth 2 (two) times daily.     Omega-3 1000 MG CAPS Take 1,000 mg by mouth 2 (two) times daily.     ondansetron (ZOFRAN-ODT) 4 MG  disintegrating tablet Take 1 tablet (4 mg total) by mouth every 6 (six) hours as needed for nausea or vomiting. 60 tablet 1   Rimegepant Sulfate (NURTEC) 75 MG TBDP Take 75 mg by mouth daily as needed (migraine).     rivaroxaban (XARELTO) 20 MG TABS tablet TAKE 1 TABLET DAILY WITH SUPPER 90 tablet 1   sodium chloride 1 g tablet Take 1 tablet (1 g total) by mouth 3 (three) times daily with meals. 90 tablet 1   vitamin C (ASCORBIC ACID) 500 MG tablet Take 500 mg by mouth 2 (two) times daily.     Zinc 50 MG CAPS Take 1 capsule by mouth 2 (two) times daily.     No current facility-administered medications for this encounter.    Allergies  Allergen Reactions   Biaxin [Clarithromycin] Anaphylaxis   Sulfonamide Derivatives Itching and Rash   Tequin [Gatifloxacin] Anaphylaxis   Demerol Nausea And Vomiting   Lactose Intolerance (Gi) Other (See Comments)    Mild abdominal pain, gas, bloating   Nsaids Other (See Comments)    GI upset/stomach pain   Risedronate Sodium Nausea And Vomiting and Other (See Comments)     GI upset/heartburn    Rofecoxib Nausea And Vomiting and Other (See Comments)    GI upset/heartburn    Alendronate Sodium Nausea And Vomiting   Aspirin Other (See Comments)    Abdominal pain    Cardizem [Diltiazem Hcl] Hives   Eliquis [Apixaban] Hives   Fish Allergy Nausea And Vomiting    Can take fish oil   Benadryl  [Diphenhydramine Hcl] Palpitations   Other Itching and Rash    EKG LEADS caused burning sores     Social History   Socioeconomic History   Marital status: Married    Spouse name: Not on file   Number of children: Not on file   Years of education: Not on file   Highest education level: Not on file  Occupational History   Not on file  Tobacco Use   Smoking status: Never   Smokeless tobacco: Never  Vaping Use   Vaping Use: Never used  Substance and Sexual Activity   Alcohol use: Not Currently   Drug use: Never   Sexual activity: Not Currently  Other Topics Concern   Not on file  Social History Narrative   Social History      Diet? Trying to gain weight      Do you drink/eat things with caffeine? 1 mug hot tea with breakfast per day      Marital status?  Married 23 years                                  What year were you married? 1970      Do you live in a house, apartment, assisted living, condo, trailer, etc.? House (46 years)      Is it one or more stories? 2 stories and walk up finished attic      How many persons live in your home? 2      Do you have any pets in your home? (please list) none      Highest level of education completed? 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     yes  Type & how often? Walk in neighborhood 4 - 6 times per week if possible      Advanced Directives (Lawyer just beginning to work on these things)      Do you have a living will?      Do you have a DNR form?                    no              If not, do you want to discuss one?      Do you have signed POA/HPOA for forms?       Functional Status      Do you have difficulty bathing or dressing yourself? no      Do you have difficulty preparing food or eating? no      Do you have difficulty managing your medications? no      Do you have  difficulty managing your finances? no      Do you have difficulty affording your medications? no   Social Determinants of Health   Financial Resource Strain: Not on file  Food Insecurity: Not on file  Transportation Needs: Not on file  Physical Activity: Not on file  Stress: Not on file  Social Connections: Not on file  Intimate Partner Violence: Not on file     ROS- All systems are reviewed and negative except as per the HPI above.  Physical Exam: Vitals:   11/24/20 1513  BP: 122/78  Pulse: 70  Weight: 50.2 kg  Height: 5\' 6"  (1.676 m)    GEN- The patient is a well appearing elderly female, alert and oriented x 3 today.   HEENT-head normocephalic, atraumatic, sclera clear, conjunctiva pink, hearing intact, trachea midline. Lungs- Clear to ausculation bilaterally, normal work of breathing Heart- Regular rate and rhythm, occasional ectopic beat, no murmurs, rubs or gallops  GI- soft, NT, ND, + BS Extremities- no clubbing, cyanosis, or edema MS- no significant deformity or atrophy Skin- no rash or lesion Psych- euthymic mood, full affect Neuro- strength and sensation are intact   Wt Readings from Last 3 Encounters:  11/24/20 50.2 kg  11/05/20 49.9 kg  09/29/20 49.4 kg    EKG today demonstrates  SR Vent. rate 70 BPM PR interval 148 ms QRS duration 90 ms QT/QTcB 398/429 ms  Echo 04/06/20 demonstrated  1. Left ventricular ejection fraction, by estimation, is 60 to 65%. The  left ventricle has normal function. The left ventricle has no regional  wall motion abnormalities. Left ventricular diastolic parameters were  normal.   2. Right ventricular systolic function is normal. The right ventricular  size is normal.   3. The mitral valve is normal in structure. No evidence of mitral valve  regurgitation. No evidence of mitral stenosis.   4. The aortic valve is normal in structure. Aortic valve regurgitation is  not visualized. No aortic stenosis is present.   5. The  inferior vena cava is normal in size with greater than 50%  respiratory variability, suggesting right atrial pressure of 3 mmHg.  Epic records are reviewed at length today  CHA2DS2-VASc Score = 3  The patient's score is based upon: CHF History: 0 HTN History: 0 Diabetes History: 0 Stroke History: 0 Vascular Disease History: 0 Age Score: 2 Gender Score: 1      ASSESSMENT AND PLAN: 1. Paroxysmal Atrial Fibrillation/atrial flutter The patient's CHA2DS2-VASc score is 3, indicating a 3.2% annual risk of stroke.   S/p  afib and flutter ablation with Dr Rayann Heman on 06/17/20. We discussed rhythm control options today. She had an "afib attack" while in the office today. On auscultation she did have an ectopic beat but otherwise regular rate and rhythm. ? PVCs.  We discussed the role of ILR for afib management post ablation. She and her husband would like to pursue ILR before considering amiodarone. Will refer her back to Dr Rayann Heman who had discussed ILR previously.  Continue Toprol 25 mg BID Continue Xarelto 20 mg daily  2. Secondary Hypercoagulable State (ICD10:  D68.69) The patient is at significant risk for stroke/thromboembolism based upon her CHA2DS2-VASc Score of 3.  Continue Rivaroxaban (Xarelto).    Follow up with Dr Rayann Heman.    [facility] [address] [place], [place] [address] [phone] 11/24/2020 3:52 PM

## 2020-11-25 DIAGNOSIS — G7100 Muscular dystrophy, unspecified: Secondary | ICD-10-CM | POA: Diagnosis not present

## 2020-11-25 DIAGNOSIS — M5431 Sciatica, right side: Secondary | ICD-10-CM | POA: Diagnosis not present

## 2021-01-07 ENCOUNTER — Other Ambulatory Visit: Payer: Self-pay

## 2021-01-07 ENCOUNTER — Ambulatory Visit (INDEPENDENT_AMBULATORY_CARE_PROVIDER_SITE_OTHER): Payer: Medicare Other | Admitting: Nurse Practitioner

## 2021-01-07 ENCOUNTER — Encounter: Payer: Self-pay | Admitting: Nurse Practitioner

## 2021-01-07 VITALS — BP 110/68 | HR 72 | Temp 97.6°F | Ht 65.08 in | Wt 114.0 lb

## 2021-01-07 DIAGNOSIS — R42 Dizziness and giddiness: Secondary | ICD-10-CM

## 2021-01-07 DIAGNOSIS — G43111 Migraine with aura, intractable, with status migrainosus: Secondary | ICD-10-CM

## 2021-01-07 DIAGNOSIS — E871 Hypo-osmolality and hyponatremia: Secondary | ICD-10-CM | POA: Diagnosis not present

## 2021-01-07 DIAGNOSIS — I4891 Unspecified atrial fibrillation: Secondary | ICD-10-CM | POA: Diagnosis not present

## 2021-01-07 DIAGNOSIS — Z23 Encounter for immunization: Secondary | ICD-10-CM

## 2021-01-07 DIAGNOSIS — M5416 Radiculopathy, lumbar region: Secondary | ICD-10-CM | POA: Diagnosis not present

## 2021-01-07 DIAGNOSIS — U099 Post covid-19 condition, unspecified: Secondary | ICD-10-CM | POA: Diagnosis not present

## 2021-01-07 NOTE — Progress Notes (Signed)
Careteam: Patient Care Team: Lauree Chandler, NP as PCP - General (Geriatric Medicine) Deliah Goody, PA-C as Consulting Physician (Physician Assistant) Danella Sensing, MD as Consulting Physician (Dermatology) Everett Graff, MD as Consulting Physician (Obstetrics and Gynecology) Vicie Mutters, MD as Consulting Physician (Otolaryngology) Marshell Garfinkel, MD as Consulting Physician (Pulmonary Disease) Gayla Medicus, MD as Referring Physician (Neurology)  PLACE OF SERVICE:  [facility] Directive information Does Patient Have a Medical Advance Directive?: Yes, Type of Advance Directive: [place];Living will, Does patient want to make changes to medical advance directive?: No - Patient declined  Allergies  Allergen Reactions   Biaxin [Clarithromycin] Anaphylaxis   Sulfonamide Derivatives Itching and Rash   Tequin [Gatifloxacin] Anaphylaxis   Demerol Nausea And Vomiting   Lactose Intolerance (Gi) Other (See Comments)    Mild abdominal pain, gas, bloating   Nsaids Other (See Comments)    GI upset/stomach pain   Risedronate Sodium Nausea And Vomiting and Other (See Comments)     GI upset/heartburn    Rofecoxib Nausea And Vomiting and Other (See Comments)    GI upset/heartburn    Alendronate Sodium Nausea And Vomiting   Aspirin Other (See Comments)    Abdominal pain    Cardizem [Diltiazem Hcl] Hives   Eliquis [Apixaban] Hives   Fish Allergy Nausea And Vomiting    Can take fish oil   Benadryl [Diphenhydramine Hcl] Palpitations   Other Itching and Rash    EKG LEADS caused burning sores     Chief Complaint  Patient presents with   Medical Management of Chronic Issues    3 month follow-up. Discuss need for shingrix, coivd #2, and colonoscopy. Flu vaccine today, patient is requesting to get in the hip.      HPI: Patient is a 75 y.o. female here for follow up   Reports experiencing increased dizziness 2 weeks ago. Has been using rollator walker  due to increased dizziness felt she was going to fall. Denies any recent falls. Reports symptoms have improved over the past 7 days. Denies any episodes of vertigo in the last two weeks. Has PRN meclizine available but has not taken.   Chronic hyponatremia last NA 131 11/2020- She  started taking 4 sodium tabs about two weeks ago. Has notices some low BP readings 90's/50-60's at home and is unsure if wrist BP cuff is correct. BP today in office 110/68.   Hearing loss with Covid and COVID vaccine has continued.   Headaches 2x in the last 6 weeks. Prescribed Roselyn Meier has received 2 injections, also takes Fioricet which helps.  Reports increase in headaches since having COVID and have been followed by neurology   Back pain - Neurologists referred to PT. Needs to follow up with PT but has been unable to go due to increased dizziness however reports has been better over the last 2 weeks.  Leg cramps- Ongoing takes magnesium at night   Post covid syndrome - continues to have fatigue at the time but has slowly improved. Appetite has increased and she has gained weight.   Exercise daily by walking  Sleeps well, wakes up x 2 to urinate.   Colonoscopy - Plans to follow up after cardiac procedure  Review of Systems:  Review of Systems  Constitutional:  Positive for malaise/fatigue. Negative for chills, fever and weight loss.  HENT:  Positive for hearing loss. Negative for congestion, sinus pain and tinnitus.   Respiratory:  Negative for shortness of breath.   Cardiovascular:  Negative for chest pain, palpitations and leg swelling.  Gastrointestinal:  Negative for constipation, diarrhea, nausea and vomiting.  Genitourinary:  Negative for frequency.  Musculoskeletal:  Positive for back pain and myalgias.  Neurological:  Positive for dizziness, weakness and headaches.  Psychiatric/Behavioral:  Negative for memory loss. The patient is not nervous/anxious and does not have insomnia.    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   Chronic migraine without aura, intractable, without status migrainosus    Diverticulosis 2010   Endometriosis    Low sodium levels    Malnutrition (HCC)    MD (muscular dystrophy) ([place])    Meniere's disease, bilateral    [facility]   Peripheral neuropathy    Dr Erling Cruz   Pneumonia    Sensorineural hearing loss, bilateral    Vertigo 2019   Past Surgical History:  Procedure Laterality Date   ABDOMINAL HYSTERECTOMY  1980 or 1981   for Endometriosis   APPENDECTOMY  1965   high school; low grade appendiceal cancer   ATRIAL FIBRILLATION ABLATION N/A 06/17/2020   Procedure: ATRIAL FIBRILLATION ABLATION;  Surgeon: Thompson Grayer, MD;  Location: [facility];  Service: Cardiovascular;  Laterality: N/A;   BREAST ENHANCEMENT SURGERY Bilateral 1979   COLONOSCOPY  01/2012   Tics; Dr Teena Irani   ESI      X 3 in 2003 & 02/17/2011 for L4-S1 symptoms   LUMBAR LAMINECTOMY  12/2012   W-S , [place]   TONSILLECTOMY  1950 or [place]     vocal cords stripped  1970   Social History:   reports that she has never smoked. She has never used smokeless tobacco. She reports that she does not currently use alcohol. She reports that she does not use drugs.  Family History  Problem Relation Age of Onset   Alcohol abuse Mother    Hypertension Mother    Diabetes Mother 97   Stroke Mother 41   Dementia Mother    Kidney failure Mother    Osteoporosis Mother    Alzheimer's disease Father 53   Cancer Maternal Aunt 27       pancreatic   Arthritis Sister 35       knee replacement   Hyperlipidemia Sister    Lung disease Maternal Grandmother    Lung disease Maternal Grandfather    Hyperlipidemia Sister 42   Other Son        recent back injury   Other Daughter        recent back injury; knee issues when playing tennis   Heart disease Neg Hx    Heart attack Neg Hx      Medications: Patient's Medications  New Prescriptions   No medications on file  Previous Medications   ACETAMINOPHEN (TYLENOL) 500 MG TABLET    Take 500 mg by mouth at bedtime as needed for moderate pain.   AZELAIC ACID EX    Apply 1 application topically 2 (two) times daily as needed (rosacea). Azelaic Acid 10%  use right after metronidazole cream   BACLOFEN (LIORESAL) 10 MG TABLET    Take 10 mg by mouth at bedtime.   BIOTIN 5000 MCG TABS    Take 5,000 mcg by mouth daily.    BUTALBITAL-ACETAMINOPHEN-CAFFEINE (FIORICET) 50-325-40 MG TABLET    Take 1 tablet by mouth every 6 (six) hours as needed (muscle pain).  CALCIUM CITRATE-VITAMIN D (CALCIUM + D PO)    Take 1 tablet by mouth 3 (three) times daily.   CARBOXYMETHYLCELLULOSE SODIUM (THERATEARS) 0.25 % SOLN    Place 1 drop into both eyes 4 (four) times daily.   CHOLECALCIFEROL (VITAMIN D3) LIQD    Take 1,000 Units by mouth every Monday, Tuesday, Wednesday, Thursday, and Friday.   EVENING PRIMROSE OIL 1000 MG CAPS    Take 1,000 mg by mouth every evening.   FEMRING 0.05 MG/24HR RING    Place 1 each vaginally every 3 (three) months.   GABAPENTIN (NEURONTIN) 100 MG CAPSULE    Take 200 mg by mouth at bedtime.    LUTEIN 20 MG TABS    Take 20 mg by mouth daily.    MAGNESIUM 300 MG CAPS    Take 300 mg by mouth at bedtime.   MECLIZINE (ANTIVERT) 12.5 MG TABLET    Take 1-2 tablets (12.5-25 mg total) by mouth 3 (three) times daily as needed for dizziness.   METHOCARBAMOL (ROBAXIN) 750 MG TABLET    Take 750 mg by mouth every 6 (six) hours.   METOPROLOL SUCCINATE (TOPROL XL) 25 MG 24 HR TABLET    Take 1 tablet (25 mg total) by mouth in the morning and at bedtime.   METRONIDAZOLE (METROGEL) 0.75 % GEL    Apply 1 application topically 2 (two) times daily as needed (rosacea).   MULTIPLE VITAMIN (MULTIVITAMIN WITH MINERALS) TABS TABLET    Take 1 tablet by mouth daily.   MULTIPLE VITAMINS-MINERALS (PRESERVISION AREDS 2) CAPS    Take 1 capsule by mouth 2  (two) times daily.   OMEGA-3 1000 MG CAPS    Take 1,000 mg by mouth 2 (two) times daily.   ONDANSETRON (ZOFRAN-ODT) 4 MG DISINTEGRATING TABLET    Take 1 tablet (4 mg total) by mouth every 6 (six) hours as needed for nausea or vomiting.   RIVAROXABAN (XARELTO) 20 MG TABS TABLET    TAKE 1 TABLET DAILY WITH SUPPER   SODIUM CHLORIDE 1 G TABLET    Take 1 tablet (1 g total) by mouth 3 (three) times daily with meals.   UBRELVY 100 MG TABS    Take 1-2 tablets by mouth 2 (two) times daily as needed.   VITAMIN C (ASCORBIC ACID) 500 MG TABLET    Take 500 mg by mouth 2 (two) times daily.  Modified Medications   No medications on file  Discontinued Medications   APIXABAN (ELIQUIS) 5 MG TABS TABLET    Take 1 tablet (5 mg total) by mouth 2 (two) times daily.   DILTIAZEM (CARDIZEM CD) 120 MG 24 HR CAPSULE    Take 1 capsule (120 mg total) by mouth daily.   RIMEGEPANT SULFATE (NURTEC) 75 MG TBDP    Take 75 mg by mouth daily as needed (migraine).   ZINC 50 MG CAPS    Take 1 capsule by mouth 2 (two) times daily.    Physical Exam:  Vitals:   01/07/21 1520  BP: 110/68  Pulse: 72  Temp: 97.6 F (36.4 C)  TempSrc: Temporal  SpO2: 99%  Weight: 114 lb (51.7 kg)  Height: 5' 5.08" (1.653 m)   Body mass index is 18.93 kg/m. Wt Readings from Last 3 Encounters:  01/07/21 114 lb (51.7 kg)  11/24/20 110 lb 9.6 oz (50.2 kg)  11/05/20 110 lb (49.9 kg)    Physical Exam Constitutional:      General: She is not in acute distress.    Appearance:  Normal appearance.  HENT:     Head: Normocephalic and atraumatic.     Nose: No congestion.     Mouth/Throat:     Mouth: Mucous membranes are moist.  Cardiovascular:     Rate and Rhythm: Normal rate and regular rhythm.  Pulmonary:     Effort: Pulmonary effort is normal.     Breath sounds: Normal breath sounds.  Abdominal:     General: Abdomen is flat.  Musculoskeletal:        General: No tenderness.     Cervical back: Normal range of motion and neck supple.      Right lower leg: No edema.     Left lower leg: No edema.  Neurological:     Mental Status: She is alert and oriented to person, place, and time.  Psychiatric:        Mood and Affect: Mood normal.        Behavior: Behavior normal.    Labs reviewed: Basic Metabolic Panel: Recent Labs    04/01/20 1352 04/05/20 1726 04/06/20 0703 04/08/20 0707 04/16/20 1125 04/21/20 1519 04/26/20 0112 05/26/20 1514 08/27/20 1415 09/23/20 1506 11/05/20 1605  NA 131*   < > 134*   < > 134*   < > 134*   < > 132* 134* 131*  K 4.0   < > 4.6   < > 4.0   < > 3.8   < > 4.8 4.3 4.7  CL 92*   < > 101   < > 99   < > 103   < > 95* 96* 96*  CO2 29   < > 24   < > 24   < > 24   < > 29 30 28   GLUCOSE 122   < > 98   < > 138*   < > 114*   < > 87 105 101  BUN 17   < > 8   < > 14   < > 15   < > 18 21 15   CREATININE 0.59*   < > 0.48   < > 0.62   < > 0.57   < > 0.57* 0.59* 0.52*  CALCIUM 9.8   < > 9.0   < > 9.1   < > 9.2   < > 9.2 9.1 9.5  MG 2.2  --  2.2  --  2.4  --  2.3  --   --   --   --   TSH 3.76  --   --   --   --   --   --   --   --   --   --    < > = values in this interval not displayed.   Liver Function Tests: Recent Labs    04/01/20 1352 11/05/20 1605  AST 21 19  ALT 15 14  BILITOT 0.5 0.4  PROT 7.2 6.7   No results for input(s): LIPASE, AMYLASE in the last 8760 hours. No results for input(s): AMMONIA in the last 8760 hours. CBC: Recent Labs    05/26/20 1514 08/18/20 1549 11/05/20 1605  WBC 6.9 4.8 5.9  NEUTROABS 5.0 3,802 4,956  HGB 14.1 13.4 13.2  HCT 43.2 40.2 39.2  MCV 92 92.0 91.2  PLT 294 308 312   Lipid Panel: No results for input(s): CHOL, HDL, LDLCALC, TRIG, CHOLHDL, LDLDIRECT in the last 8760 hours. TSH: Recent Labs    04/01/20 1352  TSH 3.76   A1C: Lab Results  Component Value Date   HGBA1C 5.6 03/03/2011     Assessment/Plan 1. Need for influenza vaccination - Flu Vaccine QUAD High Dose(Fluad)  2. Chronic hyponatremia Reports she has been taking sodium  tablet 4 times daily over the last two weeks. Blood pressure has remained stable.  - BASIC METABOLIC PANEL WITH GFR  3. Dizziness Ongoing but has improved over the past 7 days. Continue lifestyle modifications.  4. Intractable migraine with aura with status migrainosus Stable, continues to be followed by neurology, taking Ubrelvy and Fioricet 50-325-40 mg tablet q 6 hours PRN per urology.   5. Post-COVID syndrome Continues to experience fatigue. Appetite has improved weight up 4 lbs since last visit.   6. Lumbar radiculopathy Ongoing, Neurologist recommended PT. Patient plans to start sessions now that dizziness has improved.   7. A fib Followed by cardiology. Rate controlled on toprol. Continues on xarelto for anticoagulation.   Next appt: 3 months.   I personally was present during the history, physical exam and medical decision-making activities of this service and have verified that the service and findings are accurately documented in the student's note  Kent Braunschweig K. Kerrick, [facility] [phone]

## 2021-01-08 LAB — BASIC METABOLIC PANEL WITH GFR
BUN/Creatinine Ratio: 40 (calc) — ABNORMAL HIGH (ref 6–22)
BUN: 21 mg/dL (ref 7–25)
CO2: 33 mmol/L — ABNORMAL HIGH (ref 20–32)
Calcium: 9.4 mg/dL (ref 8.6–10.4)
Chloride: 99 mmol/L (ref 98–110)
Creat: 0.53 mg/dL — ABNORMAL LOW (ref 0.60–1.00)
Glucose, Bld: 82 mg/dL (ref 65–139)
Potassium: 4.5 mmol/L (ref 3.5–5.3)
Sodium: 137 mmol/L (ref 135–146)
eGFR: 96 mL/min/{1.73_m2} (ref >=60)

## 2021-01-14 ENCOUNTER — Ambulatory Visit (INDEPENDENT_AMBULATORY_CARE_PROVIDER_SITE_OTHER): Payer: Medicare Other | Admitting: Internal Medicine

## 2021-01-14 ENCOUNTER — Encounter: Payer: Self-pay | Admitting: Internal Medicine

## 2021-01-14 ENCOUNTER — Other Ambulatory Visit: Payer: Self-pay

## 2021-01-14 VITALS — BP 126/68 | HR 79 | Ht 65.0 in | Wt 111.2 lb

## 2021-01-14 DIAGNOSIS — I4892 Unspecified atrial flutter: Secondary | ICD-10-CM | POA: Diagnosis not present

## 2021-01-14 DIAGNOSIS — I480 Paroxysmal atrial fibrillation: Secondary | ICD-10-CM

## 2021-01-14 HISTORY — PX: OTHER SURGICAL HISTORY: SHX169

## 2021-01-14 NOTE — Patient Instructions (Addendum)
Medication Instructions:  Your physician recommends that you continue on your current medications as directed. Please refer to the Current Medication list given to you today.  Labwork: None ordered.  Testing/Procedures: None ordered.  Follow-Up:  Your physician wants you to follow-up in: 3 months with the [facility] Clinic. They will contact you to schedule.  You will receive a reminder letter in the mail two months in advance. If you don't receive a letter, please call our office to schedule the follow-up appointment.    Implantable Loop Recorder Placement, Care After This sheet gives you information about how to care for yourself after your procedure. Your health care provider may also give you more specific instructions. If you have problems or questions, contact your health care provider. What can I expect after the procedure? After the procedure, it is common to have: Soreness or discomfort near the incision. Some swelling or bruising near the incision.  Follow these instructions at home: Incision care   Leave your outer dressing on for 24 hours.  After 24 hours you can remove your outer dressing and shower. Leave adhesive strips in place. These skin closures may need to stay in place for 1-2 weeks. If adhesive strip edges start to loosen and curl up, you may trim the loose edges.  You may remove the strips if they have not fallen off after 2 weeks. Check your incision area every day for signs of infection. Check for: Redness, swelling, or pain. Fluid or blood. Warmth. Pus or a bad smell. Do not take baths, swim, or use a hot tub until your incision is completely healed. If your wound site starts to bleed apply pressure.      If you have any questions/concerns please call the device clinic at [phone].  Activity  Return to your normal activities.  General instructions Follow instructions from your health care provider about how to manage your implantable loop recorder and  transmit the information. Learn how to activate a recording if this is necessary for your type of device. Do not go through a metal detection gate, and do not let someone hold a metal detector over your chest. Show your ID card. Do not have an MRI unless you check with your health care provider first. Take over-the-counter and prescription medicines only as told by your health care provider. Keep all follow-up visits as told by your health care provider. This is important. Contact a health care provider if: You have redness, swelling, or pain around your incision. You have a fever. You have pain that is not relieved by your pain medicine. You have triggered your device because of fainting (syncope) or because of a heartbeat that feels like it is racing, slow, fluttering, or skipping (palpitations). Get help right away if you have: Chest pain. Difficulty breathing. Summary After the procedure, it is common to have soreness or discomfort near the incision. Change your dressing as told by your health care provider. Follow instructions from your health care provider about how to manage your implantable loop recorder and transmit the information. Keep all follow-up visits as told by your health care provider. This is important. This information is not intended to replace advice given to you by your health care provider. Make sure you discuss any questions you have with your health care provider. Document Released: 02/01/2015 Document Revised: 04/07/2017 Document Reviewed: 04/07/2017 Elsevier Patient Education  2020 [employer].

## 2021-01-14 NOTE — Progress Notes (Signed)
PCP: Lauree Chandler, NP Primary Cardiologist: Previously Dr Meda Coffee Primary EP: Dr Rayann Heman  Alicia [name] is a 75 y.o. female who presents today for routine electrophysiology followup.  She has multiple chronic complains.  She generally fells poorly subjectively.  She has daily palpitations.  Today, she denies symptoms of chest pain, shortness of breath,  lower extremity edema, dizziness, presyncope, or syncope.  The patient is otherwise without complaint today.   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   Chronic migraine without aura, intractable, without status migrainosus    Diverticulosis 2010   Endometriosis    Low sodium levels    Malnutrition (HCC)    MD (muscular dystrophy) ([place])    Meniere's disease, bilateral    [facility]    [facility]   Peripheral neuropathy    Dr Erling Cruz   Pneumonia    Sensorineural hearing loss, bilateral    Vertigo 2019   Past Surgical History:  Procedure Laterality Date   ABDOMINAL HYSTERECTOMY  1980 or 1981   for Endometriosis   APPENDECTOMY  1965   high school; low grade appendiceal cancer   ATRIAL FIBRILLATION ABLATION N/A 06/17/2020   Procedure: ATRIAL FIBRILLATION ABLATION;  Surgeon: Thompson Grayer, MD;  Location: [facility];  Service: Cardiovascular;  Laterality: N/A;   BREAST ENHANCEMENT SURGERY Bilateral 1979   COLONOSCOPY  01/2012   Tics; Dr Teena Irani   ESI      X 3 in 2003 & 02/17/2011 for L4-S1 symptoms   LUMBAR LAMINECTOMY  12/2012   W-S , [place]   TONSILLECTOMY  1950 or [place]     vocal cords stripped  1970    ROS- all systems are reviewed and negatives except as per HPI above  Current Outpatient Medications  Medication Sig Dispense Refill   acetaminophen (TYLENOL) 500 MG tablet Take 500 mg by mouth at bedtime as needed for moderate pain.     AZELAIC ACID EX Apply 1 application topically 2 (two) times daily as needed (rosacea). Azelaic Acid  10%  use right after metronidazole cream     baclofen (LIORESAL) 10 MG tablet Take 10 mg by mouth at bedtime.     Biotin 5000 MCG TABS Take 5,000 mcg by mouth daily.      butalbital-acetaminophen-caffeine (FIORICET) 50-325-40 MG tablet Take 1 tablet by mouth every 6 (six) hours as needed (muscle pain). 14 tablet 0   Calcium Citrate-Vitamin D (CALCIUM + D PO) Take 1 tablet by mouth 3 (three) times daily.     Carboxymethylcellulose Sodium (THERATEARS) 0.25 % SOLN Place 1 drop into both eyes 4 (four) times daily.     Cholecalciferol (VITAMIN D3) LIQD Take 1,000 Units by mouth every Monday, Tuesday, Wednesday, Thursday, and Friday.     Evening Primrose Oil 1000 MG CAPS Take 1,000 mg by mouth every evening.     FEMRING 0.05 MG/24HR RING Place 1 each vaginally every 3 (three) months.     gabapentin (NEURONTIN) 100 MG capsule Take 200 mg by mouth at bedtime.      Lutein 20 MG TABS Take 20 mg by mouth daily.      Magnesium 300 MG CAPS Take 300 mg by mouth at bedtime.     meclizine (ANTIVERT) 12.5 MG tablet Take 1-2 tablets (12.5-25 mg total) by mouth 3 (three) times daily as needed for dizziness. 60 tablet 0   methocarbamol (ROBAXIN)  750 MG tablet Take 750 mg by mouth every 6 (six) hours.     metoprolol succinate (TOPROL XL) 25 MG 24 hr tablet Take 1 tablet (25 mg total) by mouth in the morning and at bedtime. 180 tablet 3   metroNIDAZOLE (METROGEL) 0.75 % gel Apply 1 application topically 2 (two) times daily as needed (rosacea).     Multiple Vitamin (MULTIVITAMIN WITH MINERALS) TABS tablet Take 1 tablet by mouth daily.     Multiple Vitamins-Minerals (PRESERVISION AREDS 2) CAPS Take 1 capsule by mouth 2 (two) times daily.     Omega-3 1000 MG CAPS Take 1,000 mg by mouth 2 (two) times daily.     ondansetron (ZOFRAN-ODT) 4 MG disintegrating tablet Take 1 tablet (4 mg total) by mouth every 6 (six) hours as needed for nausea or vomiting. 60 tablet 1   rivaroxaban (XARELTO) 20 MG TABS tablet TAKE 1 TABLET  DAILY WITH SUPPER 90 tablet 1   sodium chloride 1 g tablet Take 1 tablet (1 g total) by mouth 3 (three) times daily with meals. 90 tablet 1   UBRELVY 100 MG TABS Take 1-2 tablets by mouth 2 (two) times daily as needed.     vitamin C (ASCORBIC ACID) 500 MG tablet Take 500 mg by mouth 2 (two) times daily.     No current facility-administered medications for this visit.    Physical Exam: Vitals:   01/14/21 1014  BP: 126/68  Pulse: 79  SpO2: 98%  Weight: 111 lb 3.2 oz (50.4 kg)  Height: 5\' 5"  (1.651 m)    GEN- The patient is elderly and anxious appearing, alert and oriented x 3 today.   Head- normocephalic, atraumatic Eyes-  Sclera clear, conjunctiva pink Ears- hearing intact Oropharynx- clear Lungs- Clear to ausculation bilaterally, normal work of breathing Heart- Regular rate and rhythm, no murmurs, rubs or gallops, PMI not laterally displaced GI- soft, NT, ND, + BS Extremities- no clubbing, cyanosis, or edema  Wt Readings from Last 3 Encounters:  01/14/21 111 lb 3.2 oz (50.4 kg)  01/07/21 114 lb (51.7 kg)  11/24/20 110 lb 9.6 oz (50.2 kg)    EKG tracing ordered today is personally reviewed and shows sinus, rsr'  Assessment and Plan:  Paroxysmal atrial fibrillation Doing well post ablation.  Continues to have palpitations of unclear etiology.  There is concern for afib. I would therefore advise implantation of an implantable loop recorder for long term arrhythmia monitoring to guide further management.  Risks and benefits to ILR were discussed at length with the patient today, including but not limited to risks of bleeding and infection.  Extensive device education was performed.  Remote monitoring was also discussed at length today.  The patient understands and wishes to proceed.  We will proceed at this time with ILR implantation.  We again discussed options of AAD at length.  She reports that she has previously tried flecainide and did not tolerate this.  She is clear that  she could not do a 3 day hospitalization for tikosyn due to concerns for skin irritation from telemetry. She may be willing to consider amiodarone if her AF continues.  I do not think that repeat ablation would be a good idea at this time. We could consider referral to [facility] for convergent procedure but I would favor exhausting our AAD options first  Thompson Grayer MD, [facility] 01/14/2021 10:24 AM      DESCRIPTION OF PROCEDURE:  Informed written consent was obtained.  The patient required no sedation  for the procedure today.  The patients left chest was prepped and draped. Mapping over the patient's chest was performed to identify the appropriate ILR site.  This area was found to be the left parasternal region over the 4th intercostal space.   Care was taken to avoid her shirt line per her request and to also avoid her breast implant.  The device was therefore placed in a fairly horizontal position. The skin overlying this region was infiltrated with lidocaine for local analgesia.  A 0.5-cm incision was made at the implant site.  A subcutaneous ILR pocket was fashioned using a combination of sharp and blunt dissection.  A Medtronic Reveal Linq model LNQ 22 (SN [id number]) implantable loop recorder was then placed into the pocket R waves were very prominent and measured > 0.2 mV. EBL<1 ml.  Steri- Strips and a sterile dressing were then applied.  There were no early apparent complications.     CONCLUSIONS:   1. Successful implantation of a Medtronic Reveal LINQ implantable loop recorder for palpitations/ afib management  2. No early apparent complications.    Follow-up in AF clinic in 3 months and with Drs Curt Bears or Quentin Ore thereafter.  Thompson Grayer MD, [facility] 01/14/2021 10:24 AM

## 2021-01-18 DIAGNOSIS — M8580 Other specified disorders of bone density and structure, unspecified site: Secondary | ICD-10-CM | POA: Diagnosis not present

## 2021-01-18 DIAGNOSIS — N951 Menopausal and female climacteric states: Secondary | ICD-10-CM | POA: Diagnosis not present

## 2021-01-18 DIAGNOSIS — Z1211 Encounter for screening for malignant neoplasm of colon: Secondary | ICD-10-CM | POA: Diagnosis not present

## 2021-01-18 DIAGNOSIS — Z01419 Encounter for gynecological examination (general) (routine) without abnormal findings: Secondary | ICD-10-CM | POA: Diagnosis not present

## 2021-02-07 DIAGNOSIS — Z20822 Contact with and (suspected) exposure to covid-19: Secondary | ICD-10-CM | POA: Diagnosis not present

## 2021-02-10 ENCOUNTER — Other Ambulatory Visit: Payer: Self-pay | Admitting: Nurse Practitioner

## 2021-02-10 DIAGNOSIS — E871 Hypo-osmolality and hyponatremia: Secondary | ICD-10-CM

## 2021-02-14 ENCOUNTER — Telehealth: Payer: Self-pay

## 2021-02-14 NOTE — Telephone Encounter (Signed)
The patient had questions about having a bone density test. I let her know there is no restrictions with a loop recorder. She had questions. I let her speak with Portia, rn.

## 2021-02-14 NOTE — Telephone Encounter (Signed)
Spoke with patient regarding her concern for not being able to have a bone density scan secondary to having a loop recorder. Discuss with patient that with her loop there was not testing limitations including x-ray, bone density, MRI, or mammogram. Patient states she cancelled her appointment but will call back to reschedule. Patient appreciative of information.

## 2021-02-16 ENCOUNTER — Ambulatory Visit (INDEPENDENT_AMBULATORY_CARE_PROVIDER_SITE_OTHER): Payer: Medicare Other

## 2021-02-16 DIAGNOSIS — M545 Low back pain, unspecified: Secondary | ICD-10-CM | POA: Diagnosis not present

## 2021-02-16 DIAGNOSIS — I480 Paroxysmal atrial fibrillation: Secondary | ICD-10-CM

## 2021-02-17 LAB — CUP PACEART REMOTE DEVICE CHECK
Date Time Interrogation Session: 20221214213919
Implantable Pulse Generator Implant Date: 20221111

## 2021-02-22 DIAGNOSIS — G7100 Muscular dystrophy, unspecified: Secondary | ICD-10-CM | POA: Diagnosis not present

## 2021-02-22 DIAGNOSIS — M5431 Sciatica, right side: Secondary | ICD-10-CM | POA: Diagnosis not present

## 2021-02-22 DIAGNOSIS — M545 Low back pain, unspecified: Secondary | ICD-10-CM | POA: Diagnosis not present

## 2021-02-25 NOTE — Progress Notes (Signed)
Carelink Summary Report / Loop Recorder 

## 2021-03-21 ENCOUNTER — Ambulatory Visit (INDEPENDENT_AMBULATORY_CARE_PROVIDER_SITE_OTHER): Payer: Medicare Other

## 2021-03-21 DIAGNOSIS — I480 Paroxysmal atrial fibrillation: Secondary | ICD-10-CM

## 2021-03-22 DIAGNOSIS — M5431 Sciatica, right side: Secondary | ICD-10-CM | POA: Diagnosis not present

## 2021-03-22 DIAGNOSIS — G7100 Muscular dystrophy, unspecified: Secondary | ICD-10-CM | POA: Diagnosis not present

## 2021-03-22 DIAGNOSIS — M545 Low back pain, unspecified: Secondary | ICD-10-CM | POA: Diagnosis not present

## 2021-03-22 LAB — CUP PACEART REMOTE DEVICE CHECK
Date Time Interrogation Session: 20230116213919
Implantable Pulse Generator Implant Date: 20221111

## 2021-03-25 ENCOUNTER — Telehealth: Payer: Self-pay

## 2021-03-25 NOTE — Telephone Encounter (Signed)
The patient called requesting that her PVC percentage be put in my chart each month.

## 2021-03-29 DIAGNOSIS — H52223 Regular astigmatism, bilateral: Secondary | ICD-10-CM | POA: Diagnosis not present

## 2021-03-29 DIAGNOSIS — H524 Presbyopia: Secondary | ICD-10-CM | POA: Diagnosis not present

## 2021-03-29 DIAGNOSIS — H35373 Puckering of macula, bilateral: Secondary | ICD-10-CM | POA: Diagnosis not present

## 2021-03-29 DIAGNOSIS — H40013 Open angle with borderline findings, low risk, bilateral: Secondary | ICD-10-CM | POA: Diagnosis not present

## 2021-03-29 DIAGNOSIS — H43819 Vitreous degeneration, unspecified eye: Secondary | ICD-10-CM | POA: Diagnosis not present

## 2021-03-29 DIAGNOSIS — H5203 Hypermetropia, bilateral: Secondary | ICD-10-CM | POA: Diagnosis not present

## 2021-03-29 DIAGNOSIS — H04123 Dry eye syndrome of bilateral lacrimal glands: Secondary | ICD-10-CM | POA: Diagnosis not present

## 2021-03-29 DIAGNOSIS — H3530 Unspecified macular degeneration: Secondary | ICD-10-CM | POA: Diagnosis not present

## 2021-03-30 DIAGNOSIS — M5431 Sciatica, right side: Secondary | ICD-10-CM | POA: Diagnosis not present

## 2021-03-30 DIAGNOSIS — M545 Low back pain, unspecified: Secondary | ICD-10-CM | POA: Diagnosis not present

## 2021-03-30 DIAGNOSIS — G7100 Muscular dystrophy, unspecified: Secondary | ICD-10-CM | POA: Diagnosis not present

## 2021-03-31 ENCOUNTER — Encounter: Payer: Self-pay | Admitting: Nurse Practitioner

## 2021-03-31 ENCOUNTER — Other Ambulatory Visit: Payer: Self-pay

## 2021-03-31 ENCOUNTER — Telehealth: Payer: Self-pay

## 2021-03-31 ENCOUNTER — Ambulatory Visit (INDEPENDENT_AMBULATORY_CARE_PROVIDER_SITE_OTHER): Payer: Medicare Other | Admitting: Nurse Practitioner

## 2021-03-31 DIAGNOSIS — G43111 Migraine with aura, intractable, with status migrainosus: Secondary | ICD-10-CM | POA: Diagnosis not present

## 2021-03-31 DIAGNOSIS — Z0000 Encounter for general adult medical examination without abnormal findings: Secondary | ICD-10-CM

## 2021-03-31 MED ORDER — BUTALBITAL-APAP-CAFFEINE 50-325-40 MG PO TABS: 1.0000 | ORAL_TABLET | Freq: Four times a day (QID) | ORAL | 1 refills | Status: DC | PRN

## 2021-03-31 NOTE — Telephone Encounter (Signed)
Ms. rutha, melgoza are scheduled for a virtual visit with your provider today.    Just as we do with appointments in the office, we must obtain your consent to participate.  Your consent will be active for this visit and any virtual visit you may have with one of our providers in the next 365 days.    If you have a MyChart account, I can also send a copy of this consent to you electronically.  All virtual visits are billed to your insurance company just like a traditional visit in the office.  As this is a virtual visit, video technology does not allow for your provider to perform a traditional examination.  This may limit your provider's ability to fully assess your condition.  If your provider identifies any concerns that need to be evaluated in person or the need to arrange testing such as labs, EKG, etc, we will make arrangements to do so.    Although advances in technology are sophisticated, we cannot ensure that it will always work on either your end or our end.  If the connection with a video visit is poor, we may have to switch to a telephone visit.  With either a video or telephone visit, we are not always able to ensure that we have a secure connection.   I need to obtain your verbal consent now.   Are you willing to proceed with your visit today?   JESSALYN HINOJOSA has provided verbal consent on 03/31/2021 for a virtual visit (video or telephone).   Carroll Kinds, CMA 03/31/2021  1:35 PM

## 2021-03-31 NOTE — Progress Notes (Signed)
This service is provided via telemedicine  No vital signs collected/recorded due to the encounter was a telemedicine visit.   Location of patient (ex: home, work):  Home  Patient consents to a telephone visit:  Yes, see encounter dated 03/31/2021  Location of the provider (ex: office, home):  [place]  Name of any referring provider:  N/A  Names of all persons participating in the telemedicine service and their role in the encounter:  Sherrie Mustache, Nurse Practitioner, Carroll Kinds, CMA, and patient.   Time spent on call:  17 minutes with medical assistant

## 2021-03-31 NOTE — Progress Notes (Signed)
Subjective:   Alicia [name] is a 76 y.o. female who presents for Medicare Annual (Subsequent) preventive examination.  Review of Systems     Cardiac Risk Factors include: advanced age (>61men, >42 women);sedentary lifestyle     Objective:    There were no vitals filed for this visit. There is no height or weight on file to calculate BMI.  Advanced Directives 03/31/2021 01/07/2021 11/05/2020 08/18/2020 04/29/2020 04/21/2020 04/16/2020  Does Patient Have a Medical Advance Directive? Yes Yes No No Yes Yes Yes  Type of Paramedic of [place];Living will [place];Living will - - [place];Living will [place];Living will [place]  Does patient want to make changes to medical advance directive? No - Patient declined No - Patient declined - - No - Patient declined No - Patient declined No - Patient declined  Copy of [place] in Chart? No - copy requested Yes - validated most recent copy scanned in chart (See row information) - - - No - copy requested Yes - validated most recent copy scanned in chart (See row information)  Would patient like information on creating a medical advance directive? - - No - Patient declined No - Patient declined - - No - Patient declined    Current Medications (verified) Outpatient Encounter Medications as of 03/31/2021  Medication Sig   acetaminophen (TYLENOL) 500 MG tablet Take 500 mg by mouth at bedtime as needed for moderate pain.   AZELAIC ACID EX Apply 1 application topically 2 (two) times daily as needed (rosacea). Azelaic Acid 10%  use right after metronidazole cream   baclofen (LIORESAL) 10 MG tablet Take 10 mg by mouth at bedtime.   Biotin 5000 MCG TABS Take 5,000 mcg by mouth daily.    Calcium Citrate-Vitamin D (CALCIUM + D PO) Take 1 tablet by mouth 3 (three) times daily.   Carboxymethylcellulose Sodium (THERATEARS) 0.25 % SOLN Place 1  drop into both eyes 4 (four) times daily.   Cholecalciferol (VITAMIN D3) LIQD Take 1,000 Units by mouth every Monday, Tuesday, Wednesday, Thursday, and Friday.   Evening Primrose Oil 1000 MG CAPS Take 1,000 mg by mouth every evening.   FEMRING 0.05 MG/24HR RING Place 1 each vaginally every 3 (three) months.   gabapentin (NEURONTIN) 100 MG capsule Take 200 mg by mouth at bedtime.    Lutein 20 MG TABS Take 20 mg by mouth daily.    Magnesium 300 MG CAPS Take 300 mg by mouth at bedtime.   meclizine (ANTIVERT) 12.5 MG tablet Take 1-2 tablets (12.5-25 mg total) by mouth 3 (three) times daily as needed for dizziness.   methocarbamol (ROBAXIN) 750 MG tablet Take 750 mg by mouth every 6 (six) hours.   metoprolol succinate (TOPROL XL) 25 MG 24 hr tablet Take 1 tablet (25 mg total) by mouth in the morning and at bedtime.   metroNIDAZOLE (METROGEL) 0.75 % gel Apply 1 application topically 2 (two) times daily as needed (rosacea).   Multiple Vitamin (MULTIVITAMIN WITH MINERALS) TABS tablet Take 1 tablet by mouth daily.   Multiple Vitamins-Minerals (PRESERVISION AREDS 2) CAPS Take 1 capsule by mouth 2 (two) times daily.   Omega-3 1000 MG CAPS Take 1,000 mg by mouth 2 (two) times daily.   ondansetron (ZOFRAN-ODT) 4 MG disintegrating tablet Take 1 tablet (4 mg total) by mouth every 6 (six) hours as needed for nausea or vomiting.   rivaroxaban (XARELTO) 20 MG TABS tablet TAKE  1 TABLET DAILY WITH SUPPER   sodium chloride 1 g tablet Take 1 tablet (1 g total) by mouth 3 (three) times daily with meals.   UBRELVY 100 MG TABS Take 1-2 tablets by mouth 2 (two) times daily as needed.   vitamin C (ASCORBIC ACID) 500 MG tablet Take 500 mg by mouth 2 (two) times daily.   [DISCONTINUED] butalbital-acetaminophen-caffeine (FIORICET) 50-325-40 MG tablet Take 1 tablet by mouth every 6 (six) hours as needed (muscle pain).   butalbital-acetaminophen-caffeine (FIORICET) 50-325-40 MG tablet Take 1 tablet by mouth every 6 (six) hours  as needed (muscle pain).   No facility-administered encounter medications on file as of 03/31/2021.    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   Chronic migraine without aura, intractable, without status migrainosus    Diverticulosis 2010   Endometriosis    Low sodium levels    Malnutrition ([place])    MD (muscular dystrophy) ([place])    Meniere's disease, bilateral    [facility]   Peripheral neuropathy    Dr Erling Cruz   Pneumonia    Sensorineural hearing loss, bilateral    Vertigo 2019   Past Surgical History:  Procedure Laterality Date   ABDOMINAL HYSTERECTOMY  1980 or 1981   for Endometriosis   APPENDECTOMY  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   high school; low grade appendiceal cancer   ATRIAL FIBRILLATION ABLATION N/A 06/17/2020   Procedure: ATRIAL FIBRILLATION ABLATION;  Surgeon: Thompson Grayer, MD;  Location: [facility];  Service: Cardiovascular;  Laterality: N/A;   BREAST ENHANCEMENT SURGERY Bilateral 1979   COLONOSCOPY  01/2012   Tics; Dr Teena Irani   ESI      X 3 in 2003 & 02/17/2011 for L4-S1 symptoms   implantable loop recorder placement  01/14/2021   Medtronic Reveal Linq model LNQ 22 ([place] [id number]) implantable loop recorder   LUMBAR LAMINECTOMY  12/2012   W-S , [place]   TONSILLECTOMY  1950 or 1951   TUBAL LIGATION     vocal cords stripped  1970   Family History  Problem Relation Age of Onset   Alcohol abuse Mother    Hypertension Mother    Diabetes Mother 14   Stroke Mother 22   Dementia Mother    Kidney failure Mother    Osteoporosis Mother    Alzheimer's disease Father 40   Cancer Maternal Aunt 28        pancreatic   Arthritis Sister 94       knee replacement   Hyperlipidemia Sister    Lung disease Maternal Grandmother    Lung disease Maternal Grandfather    Hyperlipidemia Sister 28   Other Son        recent back injury   Other Daughter        recent back injury; knee issues when playing tennis   Heart disease Neg Hx    Heart attack Neg Hx    Social History   Socioeconomic History   Marital status: Married    Spouse name: Not on file   Number of children: Not on file   Years of education: Not on file   Highest education level: Not on file  Occupational History   Not  on file  Tobacco Use   Smoking status: Never   Smokeless tobacco: Never  Vaping Use   Vaping Use: Never used  Substance and Sexual Activity   Alcohol use: Not Currently   Drug use: Never   Sexual activity: Not Currently  Other Topics Concern   Not on file  Social History Narrative   Social History      Diet? Trying to gain weight      Do you drink/eat things with caffeine? 1 mug hot tea with breakfast per day      Marital status?  Married 35 years                                  What year were you married? 1970      Do you live in a house, apartment, assisted living, condo, trailer, etc.? House (46 years)      Is it one or more stories? 2 stories and walk up finished attic      How many persons live in your home? 2      Do you have any pets in your home? (please list) none      Highest level of education completed? 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     yes                                 Type & how often? Walk in neighborhood 4 - 6 times per week if possible      Advanced Directives (Lawyer just beginning to work on these things)      Do you have a living will?      Do you have a DNR form?                    no              If not, do you want to discuss one?      Do you have  signed POA/HPOA for forms?       Functional Status      Do you have difficulty bathing or dressing yourself? no      Do you have difficulty preparing food or eating? no      Do you have difficulty managing your medications? no      Do you have difficulty managing your finances? no      Do you have difficulty affording your medications? no   Social Determinants of Health   Financial Resource Strain: Not on file  Food Insecurity: Not on file  Transportation Needs: Not on file  Physical Activity: Not on file  Stress: Not on file  Social Connections: Not on file    Tobacco Counseling Counseling given: Not Answered   Clinical Intake:  Pre-visit preparation completed: Yes  Pain : 0-10 Pain Type: Chronic pain Pain Location: Back Pain Orientation: Lower Pain Onset: 1 to 4 weeks ago Pain Frequency: Constant     BMI - recorded: 18 Nutritional Status: BMI <19  Underweight  How often do you need to have someone help you when you read instructions, pamphlets, or other written materials from your doctor or pharmacy?: 1 - Never  Diabetic?no  Activities of Daily Living In your present state of health, do you have any difficulty performing the following activities: 03/31/2021 04/06/2020  Hearing? Tempie Donning  Vision? N N  Difficulty concentrating or making decisions? - N  Walking or climbing stairs? N N  Dressing or bathing? N N  Doing errands, shopping? Y N  Comment does not drive Facilities manager and eating ? N -  Using the Toilet? N -  In the past six months, have you accidently leaked urine? Y -  Do you have problems with loss of bowel control? N -  Managing your Medications? N -  Managing your Finances? N -  Housekeeping or managing your Housekeeping? N -  Some recent data might be hidden    Patient Care Team: Lauree Chandler, NP as PCP - General (Geriatric Medicine) Deliah Goody, PA-C as Consulting Physician (Physician Assistant) Danella Sensing, MD as  Consulting Physician (Dermatology) Everett Graff, MD as Consulting Physician (Obstetrics and Gynecology) Vicie Mutters, MD as Consulting Physician (Otolaryngology) Marshell Garfinkel, MD as Consulting Physician (Pulmonary Disease) Gayla Medicus, MD as Referring Physician (Neurology)  Indicate any recent Medical Services you may have received from other than [facility] providers in the past year (date may be approximate).     Assessment:   This is a routine wellness examination for [place].  Hearing/Vision screen Hearing Screening - Comments:: Patient wears hearing aids. Vision Screening - Comments:: Patient has had eye exam. Patient has no vision problems. Patient see Dr. Marica Otter  Dietary issues and exercise activities discussed: Current Exercise Habits: Home exercise routine, Type of exercise: walking, Time (Minutes): 20, Frequency (Times/Week): 6, Weekly Exercise (Minutes/Week): 120   Goals Addressed   None    Depression Screen PHQ 2/9 Scores 03/31/2021 03/29/2020 03/28/2019 03/19/2018 02/14/2018 08/20/2012  PHQ - 2 Score 0 0 0 0 0 0    Fall Risk Fall Risk  03/31/2021 11/05/2020 04/29/2020 04/21/2020 04/05/2020  Falls in the past year? 0 0 0 0 0  Number falls in past yr: 0 0 0 0 0  Injury with Fall? 0 0 0 0 0  Comment - - - - -  Risk for fall due to : No Fall Risks No Fall Risks - - -  Follow up Falls evaluation completed Falls evaluation completed - - -    FALL RISK PREVENTION PERTAINING TO THE HOME:  Any stairs in or around the home? Yes  If so, are there any without handrails? No  Home free of loose throw rugs in walkways, pet beds, electrical cords, etc? Yes  Adequate lighting in your home to reduce risk of falls? Yes   ASSISTIVE DEVICES UTILIZED TO PREVENT FALLS:  Life alert? No  Use of a cane, walker or w/c? Yes  Grab bars in the bathroom? Yes  Shower chair or bench in shower? Yes  Elevated toilet seat or a handicapped toilet? No   TIMED UP AND GO:  Was the test  performed? No .    Cognitive Function: MMSE - Mini Mental State Exam 03/19/2018  Orientation to time 5  Orientation to Place 5  Registration 3  Attention/ Calculation 5  Recall 3  Language- name 2 objects 2  Language- repeat 1  Language- follow 3 step command 3  Language- read & follow direction 1  Write a sentence 1  Copy design 1  Total score 30     6CIT Screen 03/31/2021 03/29/2020 03/28/2019  What Year? 0 points 0 points 0 points  What  month? 0 points 0 points 0 points  What time? 0 points 0 points 0 points  Count back from 20 0 points 0 points 0 points  Months in reverse 0 points 0 points 0 points  Repeat phrase 0 points 0 points 0 points  Total Score 0 0 0    Immunizations Immunization History  Administered Date(s) Administered   Fluad Quad(high Dose 65+) 02/18/2019, 01/07/2021   Influenza Whole 01/27/2008   Influenza, High Dose Seasonal PF 12/05/2013, 01/25/2018   Influenza-Unspecified 01/27/2008   PFIZER(Purple Top)SARS-COV-2 Vaccination 04/18/2019   Pneumococcal Conjugate-13 11/19/2013   Pneumococcal Polysaccharide-23 02/14/2018   Td 01/03/2005   Tdap 01/03/2017   Zoster, Live 02/23/2009    TDAP status: Up to date  Flu Vaccine status: Up to date  Pneumococcal vaccine status: Up to date  Covid-19 vaccine status: Information provided on how to obtain vaccines.   Qualifies for Shingles Vaccine? Yes   Zostavax completed No   Shingrix Completed?: No.    Education has been provided regarding the importance of this vaccine. Patient has been advised to call insurance company to determine out of pocket expense if they have not yet received this vaccine. Advised may also receive vaccine at local pharmacy or Health Dept. Verbalized acceptance and understanding.  Screening Tests Health Maintenance  Topic Date Due   Zoster Vaccines- Shingrix (1 of 2) Never done   COVID-19 Vaccine (2 - Pfizer series) 04/01/2023 (Originally 05/09/2019)   TETANUS/TDAP  01/04/2027    Pneumonia Vaccine 25+ Years old  Completed   INFLUENZA VACCINE  Completed   DEXA SCAN  Completed   Hepatitis C Screening  Completed   HPV VACCINES  Aged Out   COLONOSCOPY (Pts 45-22yrs Insurance coverage will need to be confirmed)  Discontinued    Health Maintenance  Health Maintenance Due  Topic Date Due   Zoster Vaccines- Shingrix (1 of 2) Never done    Colorectal cancer screening: No longer required.   Mammogram status: No longer required due to age.  Bone Density status: Completed 02/14/21. Results reflect: Bone density results: OSTEOPENIA. Repeat every 2 years.  Lung Cancer Screening: (Low Dose CT Chest recommended if Age 33-80 years, 30 pack-year currently smoking OR have quit w/in 15years.) does not qualify.   Lung Cancer Screening Referral: na  Additional Screening:  Hepatitis C Screening: does qualify; Completed   Vision Screening: Recommended annual ophthalmology exams for early detection of glaucoma and other disorders of the eye. Is the patient up to date with their annual eye exam?  Yes  Who is the provider or what is the name of the office in which the patient attends annual eye exams? [facility] If pt is not established with a provider, would they like to be referred to a provider to establish care? No .   Dental Screening: Recommended annual dental exams for proper oral hygiene  Community Resource Referral / Chronic Care Management: CRR required this visit?  No   CCM required this visit?  No      Plan:     I have personally reviewed and noted the following in the patients chart:   Medical and social history Use of alcohol, tobacco or illicit drugs  Current medications and supplements including opioid prescriptions.  Functional ability and status Nutritional status Physical activity Advanced directives List of other physicians Hospitalizations, surgeries, and ER visits in previous 12 months Vitals Screenings to include cognitive, depression, and  falls Referrals and appointments  In addition, I have reviewed and discussed with  patient certain preventive protocols, quality metrics, and best practice recommendations. A written personalized care plan for preventive services as well as general preventive health recommendations were provided to patient.     Lauree Chandler, NP   03/31/2021    Virtual Visit via Telephone Note  I connected with pton 03/31/21 at  1:00 PM EST by telephone and verified that I am speaking with the correct person using two identifiers.  Location: Patient: home Provider: [facility]   I discussed the limitations, risks, security and privacy concerns of performing an evaluation and management service by telephone and the availability of in person appointments. I also discussed with the patient that there may be a patient responsible charge related to this service. The patient expressed understanding and agreed to proceed.   I discussed the assessment and treatment plan with the patient. The patient was provided an opportunity to ask questions and all were answered. The patient agreed with the plan and demonstrated an understanding of the instructions.   The patient was advised to call back or seek an in-person evaluation if the symptoms worsen or if the condition fails to improve as anticipated.  I provided 18 minutes of non-face-to-face time during this encounter.  Carlos American. Harle Battiest Avs printed and mailed

## 2021-03-31 NOTE — Progress Notes (Signed)
Carelink Summary Report / Loop Recorder 

## 2021-03-31 NOTE — Patient Instructions (Signed)
Alicia [name] , Thank you for taking time to come for your Medicare Wellness Visit. I appreciate your ongoing commitment to your health goals. Please review the following plan we discussed and let me know if I can assist you in the future.   Screening recommendations/referrals: Colonoscopy aged out Mammogram aged out Bone Density -up to date  Recommended yearly ophthalmology/optometry visit for glaucoma screening and checkup Recommended yearly dental visit for hygiene and checkup  Vaccinations: Influenza vaccine up to date Pneumococcal vaccine up to date Tdap vaccine up to date Shingles vaccine RECOMMENDED- to get at local pharmacy     Advanced directives: on file  Conditions/risks identified: advance age, fall risk  Next appointment: yearly    Preventive Care 25 Years and Older, Female Preventive care refers to lifestyle choices and visits with your health care provider that can promote health and wellness. What does preventive care include? A yearly physical exam. This is also called an annual well check. Dental exams once or twice a year. Routine eye exams. Ask your health care provider how often you should have your eyes checked. Personal lifestyle choices, including: Daily care of your teeth and gums. Regular physical activity. Eating a healthy diet. Avoiding tobacco and drug use. Limiting alcohol use. Practicing safe sex. Taking low-dose aspirin every day. Taking vitamin and mineral supplements as recommended by your health care provider. What happens during an annual well check? The services and screenings done by your health care provider during your annual well check will depend on your age, overall health, lifestyle risk factors, and family history of disease. Counseling  Your health care provider may ask you questions about your: Alcohol use. Tobacco use. Drug use. Emotional well-being. Home and relationship well-being. Sexual activity. Eating habits. History  of falls. Memory and ability to understand (cognition). Work and work Statistician. Reproductive health. Screening  You may have the following tests or measurements: Height, weight, and BMI. Blood pressure. Lipid and cholesterol levels. These may be checked every 5 years, or more frequently if you are over 5 years old. Skin check. Lung cancer screening. You may have this screening every year starting at age 35 if you have a 30-pack-year history of smoking and currently smoke or have quit within the past 15 years. Fecal occult blood test (FOBT) of the stool. You may have this test every year starting at age 63. Flexible sigmoidoscopy or colonoscopy. You may have a sigmoidoscopy every 5 years or a colonoscopy every 10 years starting at age 4. Hepatitis C blood test. Hepatitis B blood test. Sexually transmitted disease (STD) testing. Diabetes screening. This is done by checking your blood sugar (glucose) after you have not eaten for a while (fasting). You may have this done every 1-3 years. Bone density scan. This is done to screen for osteoporosis. You may have this done starting at age 109. Mammogram. This may be done every 1-2 years. Talk to your health care provider about how often you should have regular mammograms. Talk with your health care provider about your test results, treatment options, and if necessary, the need for more tests. Vaccines  Your health care provider may recommend certain vaccines, such as: Influenza vaccine. This is recommended every year. Tetanus, diphtheria, and acellular pertussis (Tdap, Td) vaccine. You may need a Td booster every 10 years. Zoster vaccine. You may need this after age 83. Pneumococcal 13-valent conjugate (PCV13) vaccine. One dose is recommended after age 63. Pneumococcal polysaccharide (PPSV23) vaccine. One dose is recommended after age 55. Talk  to your health care provider about which screenings and vaccines you need and how often you need  them. This information is not intended to replace advice given to you by your health care provider. Make sure you discuss any questions you have with your health care provider. Document Released: 03/19/2015 Document Revised: 11/10/2015 Document Reviewed: 12/22/2014 Elsevier Interactive Patient Education  2017 [employer] [place] Prevention in the Home Falls can cause injuries. They can happen to people of all ages. There are many things you can do to make your home safe and to help prevent falls. What can I do on the outside of my home? Regularly fix the edges of walkways and driveways and fix any cracks. Remove anything that might make you trip as you walk through a door, such as a raised step or threshold. Trim any bushes or trees on the path to your home. Use bright outdoor lighting. Clear any walking paths of anything that might make someone trip, such as rocks or tools. Regularly check to see if handrails are loose or broken. Make sure that both sides of any steps have handrails. Any raised decks and porches should have guardrails on the edges. Have any leaves, snow, or ice cleared regularly. Use sand or salt on walking paths during winter. Clean up any spills in your garage right away. This includes oil or grease spills. What can I do in the bathroom? Use night lights. Install grab bars by the toilet and in the tub and shower. Do not use towel bars as grab bars. Use non-skid mats or decals in the tub or shower. If you need to sit down in the shower, use a plastic, non-slip stool. Keep the floor dry. Clean up any water that spills on the floor as soon as it happens. Remove soap buildup in the tub or shower regularly. Attach bath mats securely with double-sided non-slip rug tape. Do not have throw rugs and other things on the floor that can make you trip. What can I do in the bedroom? Use night lights. Make sure that you have a light by your bed that is easy to reach. Do not use  any sheets or blankets that are too big for your bed. They should not hang down onto the floor. Have a firm chair that has side arms. You can use this for support while you get dressed. Do not have throw rugs and other things on the floor that can make you trip. What can I do in the kitchen? Clean up any spills right away. Avoid walking on wet floors. Keep items that you use a lot in easy-to-reach places. If you need to reach something above you, use a strong step stool that has a grab bar. Keep electrical cords out of the way. Do not use floor polish or wax that makes floors slippery. If you must use wax, use non-skid floor wax. Do not have throw rugs and other things on the floor that can make you trip. What can I do with my stairs? Do not leave any items on the stairs. Make sure that there are handrails on both sides of the stairs and use them. Fix handrails that are broken or loose. Make sure that handrails are as long as the stairways. Check any carpeting to make sure that it is firmly attached to the stairs. Fix any carpet that is loose or worn. Avoid having throw rugs at the top or bottom of the stairs. If you do have throw rugs,  attach them to the floor with carpet tape. Make sure that you have a light switch at the top of the stairs and the bottom of the stairs. If you do not have them, ask someone to add them for you. What else can I do to help prevent falls? Wear shoes that: Do not have high heels. Have rubber bottoms. Are comfortable and fit you well. Are closed at the toe. Do not wear sandals. If you use a stepladder: Make sure that it is fully opened. Do not climb a closed stepladder. Make sure that both sides of the stepladder are locked into place. Ask someone to hold it for you, if possible. Clearly mark and make sure that you can see: Any grab bars or handrails. First and last steps. Where the edge of each step is. Use tools that help you move around (mobility aids)  if they are needed. These include: Canes. Walkers. Scooters. Crutches. Turn on the lights when you go into a dark area. Replace any light bulbs as soon as they burn out. Set up your furniture so you have a clear path. Avoid moving your furniture around. If any of your floors are uneven, fix them. If there are any pets around you, be aware of where they are. Review your medicines with your doctor. Some medicines can make you feel dizzy. This can increase your chance of falling. Ask your doctor what other things that you can do to help prevent falls. This information is not intended to replace advice given to you by your health care provider. Make sure you discuss any questions you have with your health care provider. Document Released: 12/17/2008 Document Revised: 07/29/2015 Document Reviewed: 03/27/2014 Elsevier Interactive Patient Education  2017 [employer].

## 2021-04-01 ENCOUNTER — Encounter: Payer: Medicare Other | Admitting: Nurse Practitioner

## 2021-04-04 ENCOUNTER — Other Ambulatory Visit: Payer: Self-pay

## 2021-04-04 ENCOUNTER — Ambulatory Visit (HOSPITAL_COMMUNITY)
Admission: RE | Admit: 2021-04-04 | Discharge: 2021-04-04 | Disposition: A | Discharge status: Home / Self Care | Payer: Medicare Other | Source: Ambulatory Visit | Attending: Physician Assistant | Admitting: Physician Assistant

## 2021-04-04 ENCOUNTER — Other Ambulatory Visit (HOSPITAL_COMMUNITY): Payer: Self-pay | Admitting: Internal Medicine

## 2021-04-04 VITALS — BP 116/50 | HR 69 | Ht 65.0 in | Wt 109.2 lb

## 2021-04-04 DIAGNOSIS — E871 Hypo-osmolality and hyponatremia: Secondary | ICD-10-CM | POA: Diagnosis not present

## 2021-04-04 DIAGNOSIS — I4892 Unspecified atrial flutter: Secondary | ICD-10-CM | POA: Diagnosis not present

## 2021-04-04 DIAGNOSIS — Z7901 Long term (current) use of anticoagulants: Secondary | ICD-10-CM | POA: Diagnosis not present

## 2021-04-04 DIAGNOSIS — H903 Sensorineural hearing loss, bilateral: Secondary | ICD-10-CM | POA: Insufficient documentation

## 2021-04-04 DIAGNOSIS — I2510 Atherosclerotic heart disease of native coronary artery without angina pectoris: Secondary | ICD-10-CM | POA: Insufficient documentation

## 2021-04-04 DIAGNOSIS — Z8249 Family history of ischemic heart disease and other diseases of the circulatory system: Secondary | ICD-10-CM | POA: Insufficient documentation

## 2021-04-04 DIAGNOSIS — Z79899 Other long term (current) drug therapy: Secondary | ICD-10-CM | POA: Insufficient documentation

## 2021-04-04 DIAGNOSIS — D6869 Other thrombophilia: Secondary | ICD-10-CM | POA: Diagnosis not present

## 2021-04-04 DIAGNOSIS — Z95818 Presence of other cardiac implants and grafts: Secondary | ICD-10-CM | POA: Diagnosis not present

## 2021-04-04 DIAGNOSIS — G7100 Muscular dystrophy, unspecified: Secondary | ICD-10-CM | POA: Diagnosis not present

## 2021-04-04 DIAGNOSIS — I480 Paroxysmal atrial fibrillation: Secondary | ICD-10-CM | POA: Insufficient documentation

## 2021-04-04 DIAGNOSIS — H8103 Meniere's disease, bilateral: Secondary | ICD-10-CM | POA: Insufficient documentation

## 2021-04-04 DIAGNOSIS — I959 Hypotension, unspecified: Secondary | ICD-10-CM | POA: Insufficient documentation

## 2021-04-04 DIAGNOSIS — I493 Ventricular premature depolarization: Secondary | ICD-10-CM | POA: Insufficient documentation

## 2021-04-04 MED ORDER — METOPROLOL TARTRATE 25 MG PO TABS: ORAL_TABLET | ORAL | 1 refills | Status: DC

## 2021-04-04 MED ORDER — METOPROLOL SUCCINATE ER 25 MG PO TB24: 25.0000 mg | ORAL_TABLET | Freq: Two times a day (BID) | ORAL | 3 refills | Status: DC

## 2021-04-04 NOTE — Patient Instructions (Signed)
Metoprolol tartrate (lopressor) 1/2 tablet daily as needed for palpitations   Follow up with Dr. Quentin Ore in 6 months

## 2021-04-04 NOTE — Telephone Encounter (Signed)
Xarelto 20 mg refill request received. Pt is 76  years old, weight- 50.4 kg, Crea-0.53 on 01/07/21, last seen by Dr. Rayann Heman on 01/14/21, Diagnosis- PAF, CrCl- 71.85; Dose is appropriate based on dosing criteria. Will send in refill to requested pharmacy.

## 2021-04-04 NOTE — Progress Notes (Signed)
Primary Care Physician: Lauree Chandler, NP Primary Electrophysiologist: Dr Rayann Heman Referring Physician: [facility]   Alicia [name] is a 76 y.o. female with a history of chronic hyponatremia, carotid artery disease, meniere's disease, muscular dystrophy, deafness, atrial fibrillation who presents for follow up in the [facility]. The patient was initially diagnosed with atrial fibrillation 04/06/20 after presenting with symptoms of palpitations and SOB. Due to ongoing symptoms, she was seen by provider at [facility]. She was in sinus rhythm at that time by EKG and sent home.  That night she started to having worsening shortness of breath, palpitations, chest pressure, and fatigue.  Due to persistent symptoms she came to ER for further evaluation.  She was in sinus rhythm on initial evaluation however while in triage waiting for bed she started to having palpitation again.  She was found to have atrial fibrillation with rapid ventricular rate IV Cardizem and then started on drip. Symptoms improved and she eventually converted to sinus rhythm this morning.  Patient reports longstanding history of intermittent palpitation lasting for few minutes. She was started on diltiazem for rate control and Eliquis for stroke prevention. She developed a pruritic rash and theses medications were changed to Toprol and Xarelto. She has a CHADS2VASC score of 3. She was seen at the ED two additional times and flecainide was started 04/26/20. Patient is s/p afib and flutter ablation with Dr Rayann Heman on 06/17/20.    On follow up today, patient is s/p ILR implant with Dr Rayann Heman on 01/14/21. This has not shown any afib. She does have 2.6% PVC burden which makes her have symptoms of palpitations and fatigue.   Today, she denies symptoms of chest pain, orthopnea, PND, lower extremity edema, presyncope, syncope, snoring, daytime somnolence, bleeding, or neurologic sequela. The patient is  tolerating medications without difficulties and is otherwise without complaint today.    Atrial Fibrillation Risk Factors:  she does not have symptoms or diagnosis of sleep apnea. she does not have a history of rheumatic fever. she does not have a history of alcohol use.   she has a BMI of Body mass index is 18.17 kg/m.Marland Kitchen Filed Weights   04/04/21 1535  Weight: 49.5 kg      Family History  Problem Relation Age of Onset   Alcohol abuse Mother    Hypertension Mother    Diabetes Mother 82   Stroke Mother 63   Dementia Mother    Kidney failure Mother    Osteoporosis Mother    Alzheimer's disease Father 95   Cancer Maternal Aunt 22       pancreatic   Arthritis Sister 19       knee replacement   Hyperlipidemia Sister    Lung disease Maternal Grandmother    Lung disease Maternal Grandfather    Hyperlipidemia Sister 83   Other Son        recent back injury   Other Daughter        recent back injury; knee issues when playing tennis   Heart disease Neg Hx    Heart attack Neg Hx      Atrial Fibrillation Management history:  Previous antiarrhythmic drugs: flecainide Previous cardioversions: none Previous ablations: 06/17/20 CHADS2VASC score: 3 Anticoagulation history: Eliquis (rash), Xarelto    Past Medical History:  Diagnosis Date   Bronchitis    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   Chronic migraine without aura, intractable, without status migrainosus  Diverticulosis 2010   Endometriosis    Low sodium levels    Malnutrition ([place])    MD (muscular dystrophy) ([place])    Meniere's disease, bilateral    [facility]   Peripheral neuropathy    Dr Erling Cruz   Pneumonia    Sensorineural hearing loss, bilateral    Vertigo 2019   Past Surgical History:  Procedure Laterality Date   ABDOMINAL HYSTERECTOMY  1980 or 1981   for Endometriosis   APPENDECTOMY  1965   high school; low grade appendiceal cancer   ATRIAL FIBRILLATION ABLATION N/A 06/17/2020    Procedure: ATRIAL FIBRILLATION ABLATION;  Surgeon: Thompson Grayer, MD;  Location: [facility];  Service: Cardiovascular;  Laterality: N/A;   BREAST ENHANCEMENT SURGERY Bilateral 1979   COLONOSCOPY  01/2012   Tics; Dr Teena Irani   ESI      X 3 in 2003 & 02/17/2011 for L4-S1 symptoms   implantable loop recorder placement  01/14/2021   Medtronic Reveal Linq model LNQ 22 ([place] [id number]) implantable loop recorder   LUMBAR LAMINECTOMY  12/2012   [place] , [place]   TONSILLECTOMY  1950 or 1951   TUBAL LIGATION     vocal cords stripped  1970    Current Outpatient Medications  Medication Sig Dispense Refill   acetaminophen (TYLENOL) 500 MG tablet Take 500 mg by mouth at bedtime as needed for moderate pain.     AZELAIC ACID EX Apply 1 application topically 2 (two) times daily as needed (rosacea). Azelaic Acid 10%  use right after metronidazole cream     baclofen (LIORESAL) 10 MG tablet Take 10 mg by mouth at bedtime.     Biotin 5000 MCG TABS Take 5,000 mcg by mouth daily.      butalbital-acetaminophen-caffeine (FIORICET) 50-325-40 MG tablet Take 1 tablet by mouth every 6 (six) hours as needed (muscle pain). 30 tablet 1   Calcium Citrate-Vitamin D (CALCIUM + D PO) Take 1 tablet by mouth 3 (three) times daily.     Carboxymethylcellulose Sodium (THERATEARS) 0.25 % SOLN Place 1 drop into both eyes 4 (four) times daily.     Cholecalciferol (VITAMIN D3) LIQD Take 1,000 Units by mouth every Monday, Tuesday, Wednesday, Thursday, and Friday.     Evening Primrose Oil 1000 MG CAPS Take 1,000 mg by mouth every evening.     FEMRING 0.05 MG/24HR RING Place 1 each vaginally every 3 (three) months.     gabapentin (NEURONTIN) 100 MG capsule Take 200 mg by mouth at bedtime.      Lutein 20 MG TABS Take 20 mg by mouth daily.      Magnesium 300 MG CAPS Take 300 mg by mouth at bedtime.     meclizine (ANTIVERT) 12.5 MG tablet Take 1-2 tablets (12.5-25 mg total) by mouth 3 (three) times daily as needed for dizziness. 60  tablet 0   methocarbamol (ROBAXIN) 750 MG tablet Take 750 mg by mouth every 6 (six) hours.     metroNIDAZOLE (METROGEL) 0.75 % gel Apply 1 application topically 2 (two) times daily as needed (rosacea).     Multiple Vitamin (MULTIVITAMIN WITH MINERALS) TABS tablet Take 1 tablet by mouth daily.     Multiple Vitamins-Minerals (PRESERVISION AREDS 2) CAPS Take 1 capsule by mouth 2 (two) times daily.     Omega-3 1000 MG CAPS Take 1,000 mg by mouth 2 (two) times daily.     ondansetron (ZOFRAN-ODT) 4 MG disintegrating tablet Take 1 tablet (4 mg  total) by mouth every 6 (six) hours as needed for nausea or vomiting. 60 tablet 1   rivaroxaban (XARELTO) 20 MG TABS tablet TAKE 1 TABLET DAILY WITH SUPPER 90 tablet 1   sodium chloride 1 g tablet Take 1 tablet (1 g total) by mouth 3 (three) times daily with meals. 90 tablet 1   UBRELVY 100 MG TABS Take 1-2 tablets by mouth 2 (two) times daily as needed.     vitamin C (ASCORBIC ACID) 500 MG tablet Take 500 mg by mouth 2 (two) times daily.     metoprolol succinate (TOPROL XL) 25 MG 24 hr tablet Take 1 tablet (25 mg total) by mouth in the morning and at bedtime. 180 tablet 3   metoprolol tartrate (LOPRESSOR) 25 MG tablet Take 1/2 tablet by mouth daily as needed for palpitations 30 tablet 1   No current facility-administered medications for this encounter.    Allergies  Allergen Reactions   Biaxin [Clarithromycin] Anaphylaxis   Sulfonamide Derivatives Itching and Rash   Tequin [Gatifloxacin] Anaphylaxis   Demerol Nausea And Vomiting   Lactose Intolerance (Gi) Other (See Comments)    Mild abdominal pain, gas, bloating   Nsaids Other (See Comments)    GI upset/stomach pain   Risedronate Sodium Nausea And Vomiting and Other (See Comments)     GI upset/heartburn    Rofecoxib Nausea And Vomiting and Other (See Comments)    GI upset/heartburn    Alendronate Sodium Nausea And Vomiting   Aspirin Other (See Comments)    Abdominal pain    Cardizem [Diltiazem Hcl]  Hives   Eliquis [Apixaban] Hives   Fish Allergy Nausea And Vomiting    Can take fish oil   Benadryl [Diphenhydramine Hcl] Palpitations   Other Itching and Rash    EKG LEADS caused burning sores     Social History   Socioeconomic History   Marital status: Married    Spouse name: Not on file   Number of children: Not on file   Years of education: Not on file   Highest education level: Not on file  Occupational History   Not on file  Tobacco Use   Smoking status: Never   Smokeless tobacco: Never  Vaping Use   Vaping Use: Never used  Substance and Sexual Activity   Alcohol use: Not Currently   Drug use: Never   Sexual activity: Not Currently  Other Topics Concern   Not on file  Social History Narrative   Social History      Diet? Trying to gain weight      Do you drink/eat things with caffeine? 1 mug hot tea with breakfast per day      Marital status?  Married 51 years                                  What year were you married? 1970      Do you live in a house, apartment, assisted living, condo, trailer, etc.? House (46 years)      Is it one or more stories? 2 stories and walk up finished attic      How many persons live in your home? 2      Do you have any pets in your home? (please list) none      Highest level of education completed? 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     yes                                 Type & how often? Walk in neighborhood 4 - 6 times per week if possible      Advanced Directives (Lawyer just beginning to work on these things)      Do you have a living will?      Do you have a DNR form?                    no              If not, do you want to discuss one?      Do you have signed POA/HPOA for forms?       Functional Status      Do you have difficulty bathing or dressing yourself? no      Do you have difficulty  preparing food or eating? no      Do you have difficulty managing your medications? no      Do you have difficulty managing your finances? no      Do you have difficulty affording your medications? no   Social Determinants of Health   Financial Resource Strain: Not on file  Food Insecurity: Not on file  Transportation Needs: Not on file  Physical Activity: Not on file  Stress: Not on file  Social Connections: Not on file  Intimate Partner Violence: Not on file     ROS- All systems are reviewed and negative except as per the HPI above.  Physical Exam: Vitals:   04/04/21 1535  BP: (!) 116/50  Pulse: 69  Weight: 49.5 kg  Height: 5\' 5"  (1.651 m)    GEN- The patient is a well appearing elderly female, alert and oriented x 3 today.   HEENT-head normocephalic, atraumatic, sclera clear, conjunctiva pink, hearing intact, trachea midline. Lungs- Clear to ausculation bilaterally, normal work of breathing Heart- Regular rate and rhythm, no murmurs, rubs or gallops  GI- soft, NT, ND, + BS Extremities- no clubbing, cyanosis, or edema MS- no significant deformity or atrophy Skin- no rash or lesion Psych- euthymic mood, full affect Neuro- strength and sensation are intact   Wt Readings from Last 3 Encounters:  04/04/21 49.5 kg  01/14/21 50.4 kg  01/07/21 51.7 kg    EKG today demonstrates  Vent. rate 69 BPM PR interval 158 ms QRS duration 90 ms QT/QTcB 384/411 ms  Echo 04/06/20 demonstrated  1. Left ventricular ejection fraction, by estimation, is 60 to 65%. The  left ventricle has normal function. The left ventricle has no regional  wall motion abnormalities. Left ventricular diastolic parameters were  normal.   2. Right ventricular systolic function is normal. The right ventricular  size is normal.   3. The mitral valve is normal in structure. No evidence of mitral valve  regurgitation. No evidence of mitral stenosis.   4. The aortic valve is normal in structure. Aortic  valve regurgitation is  not visualized. No aortic stenosis is present.   5. The inferior vena cava is normal in size with greater than 50%  respiratory variability, suggesting right atrial pressure of 3 mmHg.  Epic records are reviewed at length today  CHA2DS2-VASc Score = 3  The patient's score is based upon: CHF History: 0 HTN  History: 0 Diabetes History: 0 Stroke History: 0 Vascular Disease History: 0 Age Score: 2 Gender Score: 1       ASSESSMENT AND PLAN: 1. Paroxysmal Atrial Fibrillation/atrial flutter The patient's CHA2DS2-VASc score is 3, indicating a 3.2% annual risk of stroke.   S/p afib and flutter ablation with Dr Rayann Heman on 06/17/20. S/p ILR implant. This shows 0% afib burden. Continue Toprol 25 mg BID Continue Xarelto 20 mg daily  2. Secondary Hypercoagulable State (ICD10:  D68.69) The patient is at significant risk for stroke/thromboembolism based upon her CHA2DS2-VASc Score of 3.  Continue Rivaroxaban (Xarelto).   3. PVCs 2.6% PVC burden Will start Lopressor 12.5 mg q daily PRN for palpitations.  Won't increase daily rate control due to h/o low BP.   Follow up with EP in 6 months to establish care. [facility] for remote monitoring.    [facility] [address] [place], [place] [address] [phone] 04/04/2021 4:39 PM

## 2021-04-06 DIAGNOSIS — G7100 Muscular dystrophy, unspecified: Secondary | ICD-10-CM | POA: Diagnosis not present

## 2021-04-06 DIAGNOSIS — M545 Low back pain, unspecified: Secondary | ICD-10-CM | POA: Diagnosis not present

## 2021-04-06 DIAGNOSIS — M5431 Sciatica, right side: Secondary | ICD-10-CM | POA: Diagnosis not present

## 2021-04-07 ENCOUNTER — Other Ambulatory Visit: Payer: Medicare Other

## 2021-04-08 ENCOUNTER — Ambulatory Visit (INDEPENDENT_AMBULATORY_CARE_PROVIDER_SITE_OTHER): Payer: Medicare Other | Admitting: Nurse Practitioner

## 2021-04-08 ENCOUNTER — Other Ambulatory Visit: Payer: Self-pay

## 2021-04-08 ENCOUNTER — Encounter: Payer: Self-pay | Admitting: Nurse Practitioner

## 2021-04-08 ENCOUNTER — Other Ambulatory Visit: Payer: Medicare Other

## 2021-04-08 VITALS — BP 116/70 | HR 75 | Temp 97.9°F

## 2021-04-08 DIAGNOSIS — E871 Hypo-osmolality and hyponatremia: Secondary | ICD-10-CM | POA: Diagnosis not present

## 2021-04-08 DIAGNOSIS — I493 Ventricular premature depolarization: Secondary | ICD-10-CM

## 2021-04-08 DIAGNOSIS — I4891 Unspecified atrial fibrillation: Secondary | ICD-10-CM

## 2021-04-08 LAB — BASIC METABOLIC PANEL WITH GFR
BUN/Creatinine Ratio: 22 (calc) (ref 6–22)
BUN: 13 mg/dL (ref 7–25)
CO2: 30 mmol/L (ref 20–32)
Calcium: 9.6 mg/dL (ref 8.6–10.4)
Chloride: 96 mmol/L — ABNORMAL LOW (ref 98–110)
Creat: 0.59 mg/dL — ABNORMAL LOW (ref 0.60–1.00)
Glucose, Bld: 95 mg/dL (ref 65–99)
Potassium: 4.1 mmol/L (ref 3.5–5.3)
Sodium: 134 mmol/L — ABNORMAL LOW (ref 135–146)
eGFR: 93 mL/min/{1.73_m2} (ref >=60)

## 2021-04-08 NOTE — Progress Notes (Signed)
Careteam: Patient Care Team: Lauree Chandler, NP as PCP - General (Geriatric Medicine) Deliah Goody, PA-C as Consulting Physician (Physician Assistant) Danella Sensing, MD as Consulting Physician (Dermatology) Everett Graff, MD as Consulting Physician (Obstetrics and Gynecology) Vicie Mutters, MD as Consulting Physician (Otolaryngology) Marshell Garfinkel, MD as Consulting Physician (Pulmonary Disease) Gayla Medicus, MD as Referring Physician (Neurology)  PLACE OF SERVICE:  [facility]  Advanced Directive information    Allergies  Allergen Reactions   Biaxin [Clarithromycin] Anaphylaxis   Sulfonamide Derivatives Itching and Rash   Tequin [Gatifloxacin] Anaphylaxis   Demerol Nausea And Vomiting   Lactose Intolerance (Gi) Other (See Comments)    Mild abdominal pain, gas, bloating   Nsaids Other (See Comments)    GI upset/stomach pain   Risedronate Sodium Nausea And Vomiting and Other (See Comments)     GI upset/heartburn    Rofecoxib Nausea And Vomiting and Other (See Comments)    GI upset/heartburn    Alendronate Sodium Nausea And Vomiting   Aspirin Other (See Comments)    Abdominal pain    Cardizem [Diltiazem Hcl] Hives   Eliquis [Apixaban] Hives   Fish Allergy Nausea And Vomiting    Can take fish oil   Benadryl [Diphenhydramine Hcl] Palpitations   Other Itching and Rash    EKG LEADS caused burning sores     Chief Complaint  Patient presents with   Acute Visit    Blood pressure concerns. Patient would like to compare wrist cuff with our arm cuff. 115/76 (patients cuff, pulse 73)      HPI: Patient is a 76 y.o. female to discuss blood pressure. She has been trying to get her dbp out of the 80s.   115/76 122/77 101/68 124/82  She saw cardiologist and added additional metoprolol to help with her PVC. Reports very symptomatic PVCs that cause her to be dizzy and short of breath. She has to sit down and very limited with physical activity when they occur   Review  of Systems:  Review of Systems  Constitutional: Negative.   HENT:  Positive for hearing loss and tinnitus.   Respiratory:  Negative for cough and sputum production.   Cardiovascular:  Positive for palpitations.  Musculoskeletal:  Negative for joint pain and myalgias.  Skin: Negative.   Neurological:  Positive for dizziness. Negative for headaches.   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   Chronic migraine without aura, intractable, without status migrainosus    Diverticulosis 2010   Endometriosis    Low sodium levels    Malnutrition (HCC)    MD (muscular dystrophy) ([place])    Meniere's disease, bilateral    [facility]   Peripheral neuropathy    Dr Erling Cruz   Pneumonia    Sensorineural hearing loss, bilateral    Vertigo 2019   Past Surgical History:  Procedure Laterality Date   ABDOMINAL HYSTERECTOMY  1980 or 1981   for Endometriosis   APPENDECTOMY  1965   high school; low grade appendiceal cancer   ATRIAL FIBRILLATION ABLATION N/A 06/17/2020   Procedure: ATRIAL FIBRILLATION ABLATION;  Surgeon: Thompson Grayer, MD;  Location: [facility];  Service: Cardiovascular;  Laterality: N/A;   BREAST ENHANCEMENT SURGERY Bilateral 1979   COLONOSCOPY  01/2012   Tics; Dr Teena Irani   ESI      X 3 in 2003 & 02/17/2011 for L4-S1 symptoms  implantable loop recorder placement  01/14/2021   Medtronic Reveal Linq model LNQ 22 ([place] [id number]) implantable loop recorder   LUMBAR LAMINECTOMY  12/2012   W-S , [place]   TONSILLECTOMY  1950 or 1951   TUBAL LIGATION     vocal cords stripped  1970   Social History:   reports that she has never smoked. She has never used smokeless tobacco. She reports that she does not currently use alcohol. She reports that she does not use drugs.  Family History  Problem Relation Age of Onset   Alcohol abuse Mother    Hypertension Mother    Diabetes Mother 87   Stroke Mother 55    Dementia Mother    Kidney failure Mother    Osteoporosis Mother    Alzheimer's disease Father 16   Cancer Maternal Aunt 78       pancreatic   Arthritis Sister 78       knee replacement   Hyperlipidemia Sister    Lung disease Maternal Grandmother    Lung disease Maternal Grandfather    Hyperlipidemia Sister 23   Other Son        recent back injury   Other Daughter        recent back injury; knee issues when playing tennis   Heart disease Neg Hx    Heart attack Neg Hx     Medications: Patient's Medications  New Prescriptions   No medications on file  Previous Medications   ACETAMINOPHEN (TYLENOL) 500 MG TABLET    Take 500 mg by mouth at bedtime as needed for moderate pain.   AZELAIC ACID EX    Apply 1 application topically 2 (two) times daily as needed (rosacea). Azelaic Acid 10%  use right after metronidazole cream   BACLOFEN (LIORESAL) 10 MG TABLET    Take 10 mg by mouth at bedtime.   BIOTIN 5000 MCG TABS    Take 5,000 mcg by mouth daily.    BUTALBITAL-ACETAMINOPHEN-CAFFEINE (FIORICET) 50-325-40 MG TABLET    Take 1 tablet by mouth every 6 (six) hours as needed (muscle pain).   CALCIUM CITRATE-VITAMIN D (CALCIUM + D PO)    Take 1 tablet by mouth 3 (three) times daily.   CARBOXYMETHYLCELLULOSE SODIUM (THERATEARS) 0.25 % SOLN    Place 1 drop into both eyes 4 (four) times daily.   CHOLECALCIFEROL (VITAMIN D3) LIQD    Take 1,000 Units by mouth every Monday, Tuesday, Wednesday, Thursday, and Friday.   EVENING PRIMROSE OIL 1000 MG CAPS    Take 1,000 mg by mouth every evening.   FEMRING 0.05 MG/24HR RING    Place 1 each vaginally every 3 (three) months.   GABAPENTIN (NEURONTIN) 100 MG CAPSULE    Take 200 mg by mouth at bedtime.    LUTEIN 20 MG TABS    Take 20 mg by mouth daily.    MAGNESIUM 300 MG CAPS    Take 300 mg by mouth at bedtime.   MECLIZINE (ANTIVERT) 12.5 MG TABLET    Take 1-2 tablets (12.5-25 mg total) by mouth 3 (three) times daily as needed for dizziness.   METHOCARBAMOL  (ROBAXIN) 750 MG TABLET    Take 750 mg by mouth every 6 (six) hours.   METOPROLOL SUCCINATE (TOPROL XL) 25 MG 24 HR TABLET    Take 1 tablet (25 mg total) by mouth in the morning and at bedtime.   METOPROLOL TARTRATE (LOPRESSOR) 25 MG TABLET    Take 1/2 tablet by mouth daily as needed  for palpitations   METRONIDAZOLE (METROGEL) 0.75 % GEL    Apply 1 application topically 2 (two) times daily as needed (rosacea).   MULTIPLE VITAMIN (MULTIVITAMIN WITH MINERALS) TABS TABLET    Take 1 tablet by mouth daily.   MULTIPLE VITAMINS-MINERALS (PRESERVISION AREDS 2) CAPS    Take 1 capsule by mouth 2 (two) times daily.   OMEGA-3 1000 MG CAPS    Take 1,000 mg by mouth 2 (two) times daily.   ONDANSETRON (ZOFRAN-ODT) 4 MG DISINTEGRATING TABLET    Take 1 tablet (4 mg total) by mouth every 6 (six) hours as needed for nausea or vomiting.   RIVAROXABAN (XARELTO) 20 MG TABS TABLET    TAKE 1 TABLET DAILY WITH SUPPER   SODIUM CHLORIDE 1 G TABLET    Take 1 tablet (1 g total) by mouth 3 (three) times daily with meals.   UBRELVY 100 MG TABS    Take 1-2 tablets by mouth 2 (two) times daily as needed.   VITAMIN C (ASCORBIC ACID) 500 MG TABLET    Take 500 mg by mouth 2 (two) times daily.  Modified Medications   No medications on file  Discontinued Medications   No medications on file    Physical Exam:  Vitals:   04/08/21 1108  BP: 116/70  Pulse: 75  Temp: 97.9 F (36.6 C)  TempSrc: Temporal  SpO2: 98%   There is no height or weight on file to calculate BMI. Wt Readings from Last 3 Encounters:  04/04/21 109 lb 3.2 oz (49.5 kg)  01/14/21 111 lb 3.2 oz (50.4 kg)  01/07/21 114 lb (51.7 kg)    Physical Exam Constitutional:      General: She is not in acute distress.    Appearance: She is well-developed. She is not diaphoretic.  HENT:     Head: Normocephalic and atraumatic.     Mouth/Throat:     Pharynx: No oropharyngeal exudate.  Cardiovascular:     Rate and Rhythm: Normal rate and regular rhythm.      Heart sounds: Normal heart sounds.  Pulmonary:     Effort: Pulmonary effort is normal.     Breath sounds: Normal breath sounds.  Musculoskeletal:     Cervical back: Normal range of motion and neck supple.     Right lower leg: No edema.     Left lower leg: No edema.  Skin:    General: Skin is warm and dry.  Neurological:     Mental Status: She is alert.  Psychiatric:        Mood and Affect: Mood normal.    Labs reviewed: Basic Metabolic Panel: Recent Labs    04/16/20 1125 04/21/20 1519 04/26/20 0112 05/26/20 1514 09/23/20 1506 11/05/20 1605 01/07/21 1604  NA 134*   < > 134*   < > 134* 131* 137  K 4.0   < > 3.8   < > 4.3 4.7 4.5  CL 99   < > 103   < > 96* 96* 99  CO2 24   < > 24   < > 30 28 33*  GLUCOSE 138*   < > 114*   < > 105 101 82  BUN 14   < > 15   < > 21 15 21   CREATININE 0.62   < > 0.57   < > 0.59* 0.52* 0.53*  CALCIUM 9.1   < > 9.2   < > 9.1 9.5 9.4  MG 2.4  --  2.3  --   --   --   --    < > =  values in this interval not displayed.   Liver Function Tests: Recent Labs    11/05/20 1605  AST 19  ALT 14  BILITOT 0.4  PROT 6.7   No results for input(s): LIPASE, AMYLASE in the last 8760 hours. No results for input(s): AMMONIA in the last 8760 hours. CBC: Recent Labs    05/26/20 1514 08/18/20 1549 11/05/20 1605  WBC 6.9 4.8 5.9  NEUTROABS 5.0 3,802 4,956  HGB 14.1 13.4 13.2  HCT 43.2 40.2 39.2  MCV 92 92.0 91.2  PLT 294 308 312   Lipid Panel: No results for input(s): CHOL, HDL, LDLCALC, TRIG, CHOLHDL, LDLDIRECT in the last 8760 hours. TSH: No results for input(s): TSH in the last 8760 hours. A1C: Lab Results  Component Value Date   HGBA1C 5.6 03/03/2011     Assessment/Plan 1. Chronic hyponatremia -continues to take sodium tablets TID, lab was followed up today  2. New onset atrial fibrillation (HCC) Rate controlled but continues with PVCs. She continues on metoprolol scheduled with PRN.   3. PVC's (premature ventricular  contractions) -continue metoprolol PRN, educated to check blood pressure prior to make sure sbp is not too low but goal blood pressure is <140/90.   To keep follow up as scheduled.  Carlos American. [place], [facility] [phone]

## 2021-04-08 NOTE — Patient Instructions (Addendum)
Goal blood pressure is less than 140/90  It is okay for bottom number gets over 90 for a reading, just important it does not stay there over a longer period of time (weeks/months)

## 2021-04-15 ENCOUNTER — Encounter: Payer: Self-pay | Admitting: Nurse Practitioner

## 2021-04-15 ENCOUNTER — Other Ambulatory Visit: Payer: Self-pay

## 2021-04-15 ENCOUNTER — Ambulatory Visit (INDEPENDENT_AMBULATORY_CARE_PROVIDER_SITE_OTHER): Payer: Medicare Other | Admitting: Nurse Practitioner

## 2021-04-15 VITALS — BP 120/70 | HR 83 | Temp 97.0°F | Ht 65.0 in | Wt 109.8 lb

## 2021-04-15 DIAGNOSIS — I493 Ventricular premature depolarization: Secondary | ICD-10-CM

## 2021-04-15 DIAGNOSIS — D6869 Other thrombophilia: Secondary | ICD-10-CM | POA: Diagnosis not present

## 2021-04-15 DIAGNOSIS — R0982 Postnasal drip: Secondary | ICD-10-CM

## 2021-04-15 DIAGNOSIS — G43111 Migraine with aura, intractable, with status migrainosus: Secondary | ICD-10-CM | POA: Diagnosis not present

## 2021-04-15 DIAGNOSIS — I480 Paroxysmal atrial fibrillation: Secondary | ICD-10-CM

## 2021-04-15 DIAGNOSIS — E871 Hypo-osmolality and hyponatremia: Secondary | ICD-10-CM | POA: Diagnosis not present

## 2021-04-15 DIAGNOSIS — G7100 Muscular dystrophy, unspecified: Secondary | ICD-10-CM | POA: Diagnosis not present

## 2021-04-15 DIAGNOSIS — R42 Dizziness and giddiness: Secondary | ICD-10-CM | POA: Diagnosis not present

## 2021-04-15 NOTE — Progress Notes (Signed)
Careteam: Patient Care Team: Lauree Chandler, NP as PCP - General (Geriatric Medicine) Deliah Goody, PA-C as Consulting Physician (Physician Assistant) Danella Sensing, MD as Consulting Physician (Dermatology) Everett Graff, MD as Consulting Physician (Obstetrics and Gynecology) Vicie Mutters, MD as Consulting Physician (Otolaryngology) Marshell Garfinkel, MD as Consulting Physician (Pulmonary Disease) Gayla Medicus, MD as Referring Physician (Neurology)  PLACE OF SERVICE:  [place] Directive information Does Patient Have a Medical Advance Directive?: Yes, Type of Advance Directive: [place];Living will, Does patient want to make changes to medical advance directive?: No - Patient declined  Allergies  Allergen Reactions   Biaxin [Clarithromycin] Anaphylaxis   Sulfonamide Derivatives Itching and Rash   Tequin [Gatifloxacin] Anaphylaxis   Demerol Nausea And Vomiting   Lactose Intolerance (Gi) Other (See Comments)    Mild abdominal pain, gas, bloating   Nsaids Other (See Comments)    GI upset/stomach pain   Risedronate Sodium Nausea And Vomiting and Other (See Comments)     GI upset/heartburn    Rofecoxib Nausea And Vomiting and Other (See Comments)    GI upset/heartburn    Alendronate Sodium Nausea And Vomiting   Aspirin Other (See Comments)    Abdominal pain    Cardizem [Diltiazem Hcl] Hives   Eliquis [Apixaban] Hives   Fish Allergy Nausea And Vomiting    Can take fish oil   Benadryl [Diphenhydramine Hcl] Palpitations   Other Itching and Rash    EKG LEADS caused burning sores     Chief Complaint  Patient presents with   Medical Management of Chronic Issues    3 month follow up.   Health Maintenance    Shingrix vaccine     HPI: Patient is a 76 y.o. female for routine follow up   Reports her blood pressure has been up and down all week. A lot of the time sbp will be in good range and dbp high. Reports she is changing salt tablet  based on blood pressure.  Continues to have palpitations. Reports she has been having chest pains and has LE edema worse in the evening.  Reports she is having more chest discomfort in the evening. Did not disclose this with her cardiologist.  She said that they are monitoring her on the heart monitor and she has called them and asked if this could be related to her heart. She stated she was reassured that someone would notify her.  Wears compression hose to help with the swelling.  Always by evening she will have edema. This has been going on for several years.   Reports increase sputum, has been going on for 2.5 years. Has taken allegra, zyrtec and claritin. Saw allergist.  Worse when she is hungry.  Feels like she needs to clear her nose.  At night her nose will stop up  Stated that reflux from stomach and esophagus was likely causing the increase in sputum. Had a lot of side effects from medication given to treat GERD.   She is in the progress of changing GI, plans to see Dr Collene Mares for consultation.     Review of Systems:  Review of Systems  Constitutional:  Negative for chills, fever and weight loss.  HENT:  Negative for tinnitus.   Respiratory:  Positive for cough (to get up sputum) and sputum production. Negative for shortness of breath.   Cardiovascular:  Positive for leg swelling. Negative for chest pain and palpitations.  Gastrointestinal:  Negative for abdominal pain, constipation, diarrhea  and heartburn.  Genitourinary:  Negative for dysuria, frequency and urgency.  Musculoskeletal:  Positive for joint pain and myalgias. Negative for back pain and falls.  Skin: Negative.   Neurological:  Positive for headaches (chronic). Negative for dizziness.  Psychiatric/Behavioral:  Negative for depression and memory loss. The patient does not have insomnia.    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   Chronic migraine without aura,  intractable, without status migrainosus    Diverticulosis 2010   Endometriosis    Low sodium levels    Malnutrition (HCC)    MD (muscular dystrophy) ([place])    Meniere's disease, bilateral    [facility]   Peripheral neuropathy    Dr Erling Cruz   Pneumonia    Sensorineural hearing loss, bilateral    Vertigo 2019   Past Surgical History:  Procedure Laterality Date   ABDOMINAL HYSTERECTOMY  1980 or 1981   for Endometriosis   APPENDECTOMY  1965   high school; low grade appendiceal cancer   ATRIAL FIBRILLATION ABLATION N/A 06/17/2020   Procedure: ATRIAL FIBRILLATION ABLATION;  Surgeon: Thompson Grayer, MD;  Location: [facility];  Service: Cardiovascular;  Laterality: N/A;   BREAST ENHANCEMENT SURGERY Bilateral 1979   COLONOSCOPY  01/2012   Tics; Dr Teena Irani   ESI      X 3 in 2003 & 02/17/2011 for L4-S1 symptoms   implantable loop recorder placement  01/14/2021   Medtronic Reveal Linq model LNQ 22 ([place] [id number]) implantable loop recorder   LUMBAR LAMINECTOMY  12/2012   W-S , [place]   TONSILLECTOMY  1950 or 1951   TUBAL LIGATION     vocal cords stripped  1970   Social History:   reports that she has never smoked. She has never used smokeless tobacco. She reports that she does not currently use alcohol. She reports that she does not use drugs.  Family History  Problem Relation Age of Onset   Alcohol abuse Mother    Hypertension Mother    Diabetes Mother 50   Stroke Mother 17   Dementia Mother    Kidney failure Mother    Osteoporosis Mother    Alzheimer's disease Father 43   Cancer Maternal Aunt 22       pancreatic   Arthritis Sister 70       knee replacement   Hyperlipidemia Sister    Lung disease Maternal Grandmother    Lung disease Maternal Grandfather    Hyperlipidemia Sister 9   Other Son        recent back injury   Other Daughter        recent back injury; knee issues when playing tennis   Heart disease Neg Hx    Heart attack Neg Hx      Medications: Patient's Medications  New Prescriptions   No medications on file  Previous Medications   ACETAMINOPHEN (TYLENOL) 500 MG TABLET    Take 500 mg by mouth at bedtime as needed for moderate pain.   AZELAIC ACID EX    Apply 1 application topically 2 (two) times daily as needed (rosacea). Azelaic Acid 10%  use right after metronidazole cream   BACLOFEN (LIORESAL) 10 MG TABLET    Take 10 mg by mouth at bedtime.   BIOTIN 5000 MCG TABS    Take 5,000 mcg by mouth daily.    BUTALBITAL-ACETAMINOPHEN-CAFFEINE (FIORICET) 50-325-40 MG TABLET  Take 1 tablet by mouth every 6 (six) hours as needed (muscle pain).   CALCIUM CITRATE-VITAMIN D (CALCIUM + D PO)    Take 1 tablet by mouth 3 (three) times daily.   CARBOXYMETHYLCELLULOSE SODIUM (THERATEARS) 0.25 % SOLN    Place 1 drop into both eyes 4 (four) times daily.   CHOLECALCIFEROL (VITAMIN D3) LIQD    Take 1,000 Units by mouth every Monday, Tuesday, Wednesday, Thursday, and Friday.   EVENING PRIMROSE OIL 1000 MG CAPS    Take 1,000 mg by mouth every evening.   FEMRING 0.05 MG/24HR RING    Place 1 each vaginally every 3 (three) months.   GABAPENTIN (NEURONTIN) 100 MG CAPSULE    Take 200 mg by mouth at bedtime.    LUTEIN 20 MG TABS    Take 20 mg by mouth daily.    MAGNESIUM 300 MG CAPS    Take 300 mg by mouth at bedtime.   MECLIZINE (ANTIVERT) 12.5 MG TABLET    Take 1-2 tablets (12.5-25 mg total) by mouth 3 (three) times daily as needed for dizziness.   METHOCARBAMOL (ROBAXIN) 750 MG TABLET    Take 750 mg by mouth every 6 (six) hours.   METOPROLOL SUCCINATE (TOPROL XL) 25 MG 24 HR TABLET    Take 1 tablet (25 mg total) by mouth in the morning and at bedtime.   METOPROLOL TARTRATE (LOPRESSOR) 25 MG TABLET    Take 1/2 tablet by mouth daily as needed for palpitations   METRONIDAZOLE (METROGEL) 0.75 % GEL    Apply 1 application topically 2 (two) times daily as needed (rosacea).   MULTIPLE VITAMIN (MULTIVITAMIN WITH MINERALS) TABS TABLET    Take 1  tablet by mouth daily.   MULTIPLE VITAMINS-MINERALS (PRESERVISION AREDS 2) CAPS    Take 1 capsule by mouth 2 (two) times daily.   OMEGA-3 1000 MG CAPS    Take 1,000 mg by mouth 2 (two) times daily.   ONDANSETRON (ZOFRAN-ODT) 4 MG DISINTEGRATING TABLET    Take 1 tablet (4 mg total) by mouth every 6 (six) hours as needed for nausea or vomiting.   RIVAROXABAN (XARELTO) 20 MG TABS TABLET    TAKE 1 TABLET DAILY WITH SUPPER   SODIUM CHLORIDE 1 G TABLET    Take 1 tablet (1 g total) by mouth 3 (three) times daily with meals.   UBRELVY 100 MG TABS    Take 1-2 tablets by mouth 2 (two) times daily as needed.   VITAMIN C (ASCORBIC ACID) 500 MG TABLET    Take 500 mg by mouth 2 (two) times daily.  Modified Medications   No medications on file  Discontinued Medications   No medications on file    Physical Exam:  Vitals:   04/15/21 1548  BP: 120/70  Pulse: 83  Temp: (!) 97 F (36.1 C)  SpO2: 97%  Weight: 109 lb 12.8 oz (49.8 kg)  Height: 5\' 5"  (1.651 m)   Body mass index is 18.27 kg/m. Wt Readings from Last 3 Encounters:  04/15/21 109 lb 12.8 oz (49.8 kg)  04/04/21 109 lb 3.2 oz (49.5 kg)  01/14/21 111 lb 3.2 oz (50.4 kg)    Physical Exam Constitutional:      General: She is not in acute distress.    Appearance: She is well-developed. She is not diaphoretic.  HENT:     Head: Normocephalic and atraumatic.  Eyes:     Conjunctiva/sclera: Conjunctivae normal.     Pupils: Pupils are equal, round, and reactive  to light.  Cardiovascular:     Rate and Rhythm: Normal rate and regular rhythm.     Heart sounds: Normal heart sounds.  Pulmonary:     Effort: Pulmonary effort is normal.     Breath sounds: Normal breath sounds.  Abdominal:     General: Bowel sounds are normal.     Palpations: Abdomen is soft.  Musculoskeletal:     Cervical back: Normal range of motion and neck supple.     Right lower leg: No edema.     Left lower leg: No edema.  Skin:    General: Skin is warm and dry.   Neurological:     Mental Status: She is alert.  Psychiatric:        Mood and Affect: Mood normal.    Labs reviewed: Basic Metabolic Panel: Recent Labs    04/16/20 1125 04/21/20 1519 04/26/20 0112 05/26/20 1514 11/05/20 1605 01/07/21 1604 04/08/21 0000  NA 134*   < > 134*   < > 131* 137 134*  K 4.0   < > 3.8   < > 4.7 4.5 4.1  CL 99   < > 103   < > 96* 99 96*  CO2 24   < > 24   < > 28 33* 30  GLUCOSE 138*   < > 114*   < > 101 82 95  BUN 14   < > 15   < > 15 21 13   CREATININE 0.62   < > 0.57   < > 0.52* 0.53* 0.59*  CALCIUM 9.1   < > 9.2   < > 9.5 9.4 9.6  MG 2.4  --  2.3  --   --   --   --    < > = values in this interval not displayed.   Liver Function Tests: Recent Labs    11/05/20 1605  AST 19  ALT 14  BILITOT 0.4  PROT 6.7   No results for input(s): LIPASE, AMYLASE in the last 8760 hours. No results for input(s): AMMONIA in the last 8760 hours. CBC: Recent Labs    05/26/20 1514 08/18/20 1549 11/05/20 1605  WBC 6.9 4.8 5.9  NEUTROABS 5.0 3,802 4,956  HGB 14.1 13.4 13.2  HCT 43.2 40.2 39.2  MCV 92 92.0 91.2  PLT 294 308 312   Lipid Panel: No results for input(s): CHOL, HDL, LDLCALC, TRIG, CHOLHDL, LDLDIRECT in the last 8760 hours. TSH: No results for input(s): TSH in the last 8760 hours. A1C: Lab Results  Component Value Date   HGBA1C 5.6 03/03/2011     Assessment/Plan 1. Chronic hyponatremia -stable on last labs, continues on sodium tablet.   2. Intractable migraine with aura with status migrainosus -stable, uses fiorcet PRN  3. PVC's (premature ventricular contractions) -ongoing, followed by cardiology for management.  4. Paroxysmal atrial fibrillation (HCC) -rate controlled on lopressor  5. Post-nasal drip Ongoing, causing chronic cough and congestion.  6. Dizziness Stable, continues to have issues, worse with PVCs  7. Secondary hypercoagulable state ([place]) Stable, continues on xarelto  8. Muscular dystrophy ([place]) -ongoing,  continue supportive care.    Return in about 3 months (around 07/13/2021) for routine follow up . Carlos American. [place], [facility] [phone]

## 2021-04-20 DIAGNOSIS — M545 Low back pain, unspecified: Secondary | ICD-10-CM | POA: Diagnosis not present

## 2021-04-20 DIAGNOSIS — M5431 Sciatica, right side: Secondary | ICD-10-CM | POA: Diagnosis not present

## 2021-04-20 DIAGNOSIS — G7100 Muscular dystrophy, unspecified: Secondary | ICD-10-CM | POA: Diagnosis not present

## 2021-04-25 ENCOUNTER — Ambulatory Visit (INDEPENDENT_AMBULATORY_CARE_PROVIDER_SITE_OTHER): Payer: Medicare Other

## 2021-04-25 DIAGNOSIS — I480 Paroxysmal atrial fibrillation: Secondary | ICD-10-CM

## 2021-04-25 LAB — CUP PACEART REMOTE DEVICE CHECK
Date Time Interrogation Session: 20230219231229
Implantable Pulse Generator Implant Date: 20221111

## 2021-04-26 ENCOUNTER — Telehealth: Payer: Self-pay

## 2021-04-26 NOTE — Telephone Encounter (Signed)
The patient called because she would like her pvc percentage included in her mychart message. I told her I will tell the nurses again about her request.

## 2021-04-27 DIAGNOSIS — M545 Low back pain, unspecified: Secondary | ICD-10-CM | POA: Diagnosis not present

## 2021-04-27 DIAGNOSIS — M5431 Sciatica, right side: Secondary | ICD-10-CM | POA: Diagnosis not present

## 2021-04-27 DIAGNOSIS — G7100 Muscular dystrophy, unspecified: Secondary | ICD-10-CM | POA: Diagnosis not present

## 2021-04-29 NOTE — Progress Notes (Signed)
Carelink Summary Report / Loop Recorder 

## 2021-05-04 DIAGNOSIS — G7100 Muscular dystrophy, unspecified: Secondary | ICD-10-CM | POA: Diagnosis not present

## 2021-05-04 DIAGNOSIS — M5431 Sciatica, right side: Secondary | ICD-10-CM | POA: Diagnosis not present

## 2021-05-04 DIAGNOSIS — M545 Low back pain, unspecified: Secondary | ICD-10-CM | POA: Diagnosis not present

## 2021-05-05 DIAGNOSIS — K5730 Diverticulosis of large intestine without perforation or abscess without bleeding: Secondary | ICD-10-CM | POA: Diagnosis not present

## 2021-05-05 DIAGNOSIS — R059 Cough, unspecified: Secondary | ICD-10-CM | POA: Diagnosis not present

## 2021-05-05 DIAGNOSIS — Z1211 Encounter for screening for malignant neoplasm of colon: Secondary | ICD-10-CM | POA: Diagnosis not present

## 2021-05-11 DIAGNOSIS — M545 Low back pain, unspecified: Secondary | ICD-10-CM | POA: Diagnosis not present

## 2021-05-11 DIAGNOSIS — G7100 Muscular dystrophy, unspecified: Secondary | ICD-10-CM | POA: Diagnosis not present

## 2021-05-11 DIAGNOSIS — M5431 Sciatica, right side: Secondary | ICD-10-CM | POA: Diagnosis not present

## 2021-05-18 DIAGNOSIS — G7100 Muscular dystrophy, unspecified: Secondary | ICD-10-CM | POA: Diagnosis not present

## 2021-05-18 DIAGNOSIS — M545 Low back pain, unspecified: Secondary | ICD-10-CM | POA: Diagnosis not present

## 2021-05-18 DIAGNOSIS — M5431 Sciatica, right side: Secondary | ICD-10-CM | POA: Diagnosis not present

## 2021-05-20 ENCOUNTER — Other Ambulatory Visit (HOSPITAL_COMMUNITY): Payer: Self-pay

## 2021-05-20 MED ORDER — RIVAROXABAN 20 MG PO TABS: 20.0000 mg | ORAL_TABLET | Freq: Every day | ORAL | 0 refills | Status: DC

## 2021-05-25 DIAGNOSIS — Z20822 Contact with and (suspected) exposure to covid-19: Secondary | ICD-10-CM | POA: Diagnosis not present

## 2021-05-26 DIAGNOSIS — M5430 Sciatica, unspecified side: Secondary | ICD-10-CM | POA: Diagnosis not present

## 2021-05-26 DIAGNOSIS — G7100 Muscular dystrophy, unspecified: Secondary | ICD-10-CM | POA: Diagnosis not present

## 2021-05-26 DIAGNOSIS — G43111 Migraine with aura, intractable, with status migrainosus: Secondary | ICD-10-CM | POA: Diagnosis not present

## 2021-05-30 ENCOUNTER — Ambulatory Visit (INDEPENDENT_AMBULATORY_CARE_PROVIDER_SITE_OTHER): Payer: Medicare Other

## 2021-05-30 DIAGNOSIS — I480 Paroxysmal atrial fibrillation: Secondary | ICD-10-CM

## 2021-05-31 LAB — CUP PACEART REMOTE DEVICE CHECK
Date Time Interrogation Session: 20230326230532
Implantable Pulse Generator Implant Date: 20221111

## 2021-06-09 NOTE — Progress Notes (Signed)
Carelink Summary Report / Loop Recorder 

## 2021-06-25 DIAGNOSIS — Z20822 Contact with and (suspected) exposure to covid-19: Secondary | ICD-10-CM | POA: Diagnosis not present

## 2021-07-02 LAB — CUP PACEART REMOTE DEVICE CHECK
Date Time Interrogation Session: 20230428230504
Implantable Pulse Generator Implant Date: 20221111

## 2021-07-04 ENCOUNTER — Ambulatory Visit (INDEPENDENT_AMBULATORY_CARE_PROVIDER_SITE_OTHER): Payer: Medicare Other

## 2021-07-04 DIAGNOSIS — I480 Paroxysmal atrial fibrillation: Secondary | ICD-10-CM

## 2021-07-06 DIAGNOSIS — Z20822 Contact with and (suspected) exposure to covid-19: Secondary | ICD-10-CM | POA: Diagnosis not present

## 2021-07-15 ENCOUNTER — Encounter: Payer: Self-pay | Admitting: Nurse Practitioner

## 2021-07-15 ENCOUNTER — Ambulatory Visit (INDEPENDENT_AMBULATORY_CARE_PROVIDER_SITE_OTHER): Payer: Medicare Other | Admitting: Nurse Practitioner

## 2021-07-15 VITALS — BP 128/74 | HR 69 | Temp 97.5°F | Resp 18 | Ht 66.0 in | Wt 110.0 lb

## 2021-07-15 DIAGNOSIS — E871 Hypo-osmolality and hyponatremia: Secondary | ICD-10-CM | POA: Diagnosis not present

## 2021-07-15 DIAGNOSIS — G43111 Migraine with aura, intractable, with status migrainosus: Secondary | ICD-10-CM | POA: Diagnosis not present

## 2021-07-15 DIAGNOSIS — I480 Paroxysmal atrial fibrillation: Secondary | ICD-10-CM | POA: Diagnosis not present

## 2021-07-15 DIAGNOSIS — G7100 Muscular dystrophy, unspecified: Secondary | ICD-10-CM | POA: Diagnosis not present

## 2021-07-15 DIAGNOSIS — E559 Vitamin D deficiency, unspecified: Secondary | ICD-10-CM | POA: Diagnosis not present

## 2021-07-15 DIAGNOSIS — I493 Ventricular premature depolarization: Secondary | ICD-10-CM | POA: Diagnosis not present

## 2021-07-15 NOTE — Progress Notes (Signed)
? ? ?Careteam: ?Patient Care Team: ?Lauree Chandler, NP as PCP - General (Geriatric Medicine) ?Deliah Goody, PA-C as Consulting Physician (Physician Assistant) ?Danella Sensing, MD as Consulting Physician (Dermatology) ?Everett Graff, MD as Consulting Physician (Obstetrics and Gynecology) ?Vicie Mutters, MD as Consulting Physician (Otolaryngology) ?Marshell Garfinkel, MD as Consulting Physician (Pulmonary Disease) ?Gayla Medicus, MD as Referring Physician (Neurology) ? ?PLACE OF SERVICE:  ?[facility]  ?Advanced Directive information ?Does Patient Have a Medical Advance Directive?: No, Would patient like information on creating a medical advance directive?: No - Patient declined ? ?Allergies  ?Allergen Reactions  ? Biaxin [Clarithromycin] Anaphylaxis  ? Sulfonamide Derivatives Itching and Rash  ? Tequin [Gatifloxacin] Anaphylaxis  ? Demerol Nausea And Vomiting  ? Lactose Intolerance (Gi) Other (See Comments)  ?  Mild abdominal pain, gas, bloating  ? Nsaids Other (See Comments)  ?  GI upset/stomach pain  ? Risedronate Sodium Nausea And Vomiting and Other (See Comments)  ?   GI upset/heartburn   ? Rofecoxib Nausea And Vomiting and Other (See Comments)  ?  GI upset/heartburn   ? Alendronate Sodium Nausea And Vomiting  ? Aspirin Other (See Comments)  ?  Abdominal pain   ? Cardizem [Diltiazem Hcl] Hives  ? Eliquis [Apixaban] Hives  ? Fish Allergy Nausea And Vomiting  ?  Can take fish oil  ? Benadryl [Diphenhydramine Hcl] Palpitations  ? Other Itching and Rash  ?  EKG LEADS caused burning sores   ? ? ?Chief Complaint  ?Patient presents with  ? Medical Management of Chronic Issues  ?  PT is here for a 31M F/U, discuss Shingrix vaccine   ? ? ? ?HPI: Patient is a 76 y.o. female for routine follow up  ? ?had PT due to low back pain but had to stop due to husband not being able to drive and he has to drive her. Reports the last time she went she was in a lot of pain.  ?Dr Erling Cruz is her neurologist and had her on Fioricet for her  headache and for her muscle pain.  ?Her neurologist does not like her taking Fioricet. Recommended she take cymbalta for pain but she did not like the side effect profile- esp the hyponatremia.  ?She generally does not take Fioricet for muscle pains, mostly after therapy.  ?When she is driving in the car a lot she needs Fioricet, never will use more than 2 days in a row ?Has to take robaxin around the clock.  ? ?A fib followed by cardiology- continues to have palpitations and had a bad week.  ? ?On gabapentin to help with nerve pain but has been on for years. Does not know if it really helps.  ? ? ?Review of Systems:  ?Review of Systems  ?Constitutional:  Negative for chills, fever and weight loss.  ?HENT:  Negative for tinnitus.   ?Respiratory:  Negative for cough, sputum production and shortness of breath.   ?Cardiovascular:  Positive for palpitations. Negative for chest pain and leg swelling.  ?Gastrointestinal:  Negative for abdominal pain, constipation, diarrhea and heartburn.  ?Genitourinary:  Negative for dysuria, frequency and urgency.  ?Musculoskeletal:  Negative for back pain, falls, joint pain and myalgias.  ?Skin: Negative.   ?Neurological:  Positive for dizziness, weakness and headaches.  ?Psychiatric/Behavioral:  Negative for depression and memory loss. The patient does not have insomnia.   ? ?Past Medical History:  ?Diagnosis Date  ? Bronchitis   ? Cancer [facility]) 2009  ? Basal Cell  ?  Chronic migraine without aura, intractable, without status migrainosus   ? Diverticulosis 2010  ? Endometriosis   ? Low sodium levels   ? Malnutrition ([place])   ? MD (muscular dystrophy) ([place])   ? Meniere's disease, bilateral   ? [facility]  ? Peripheral neuropathy   ? Dr Erling Cruz  ? Pneumonia   ? Sensorineural hearing loss, bilateral   ? Vertigo 2019  ? ?Past Surgical History:  ?Procedure Laterality Date  ? ABDOMINAL HYSTERECTOMY  1980 or 1981  ? for Endometriosis  ? APPENDECTOMY  1965  ? high school;  low grade appendiceal cancer  ? ATRIAL FIBRILLATION ABLATION N/A 06/17/2020  ? Procedure: ATRIAL FIBRILLATION ABLATION;  Surgeon: Thompson Grayer, MD;  Location: [place] [facility];  Service: Cardiovascular;  Laterality: N/A;  ? BREAST ENHANCEMENT SURGERY Bilateral 1979  ? COLONOSCOPY  01/2012  ? Tics; Dr Teena Irani  ? ESI    ?  X 3 in 2003 & 02/17/2011 for L4-S1 symptoms  ? implantable loop recorder placement  01/14/2021  ? Medtronic Reveal Linq model LNQ 22 ([place] [id number]) implantable loop recorder  ? LUMBAR LAMINECTOMY  12/2012  ? [place] , [place]  ? TONSILLECTOMY  1950 or 1951  ? TUBAL LIGATION    ? vocal cords stripped  1970  ? ?Social History: ?  reports that she has never smoked. She has never used smokeless tobacco. She reports that she does not currently use alcohol. She reports that she does not use drugs. ? ?Family History  ?Problem Relation Age of Onset  ? Alcohol abuse Mother   ? Hypertension Mother   ? Diabetes Mother 68  ? Stroke Mother 45  ? Dementia Mother   ? Kidney failure Mother   ? Osteoporosis Mother   ? Alzheimer's disease Father 45  ? Cancer Maternal Aunt 6  ?     pancreatic  ? Arthritis Sister 75  ?     knee replacement  ? Hyperlipidemia Sister   ? Lung disease Maternal Grandmother   ? Lung disease Maternal Grandfather   ? Hyperlipidemia Sister 63  ? Other Son   ?     recent back injury  ? Other Daughter   ?     recent back injury; knee issues when playing tennis  ? Heart disease Neg Hx   ? Heart attack Neg Hx   ? ? ?Medications: ?Patient's Medications  ?New Prescriptions  ? No medications on file  ?Previous Medications  ? ACETAMINOPHEN (TYLENOL) 500 MG TABLET    Take 500 mg by mouth at bedtime as needed for moderate pain.  ? AZELAIC ACID EX    Apply 1 application topically 2 (two) times daily as needed (rosacea). Azelaic Acid 10%  use right after metronidazole cream  ? BACLOFEN (LIORESAL) 10 MG TABLET    Take 10 mg by mouth at bedtime.  ? BIOTIN 5000 MCG TABS    Take 5,000 mcg by mouth daily.   ?  BUTALBITAL-ACETAMINOPHEN-CAFFEINE (FIORICET) 50-325-40 MG TABLET    Take 1 tablet by mouth every 6 (six) hours as needed (muscle pain).  ? CALCIUM CITRATE-VITAMIN D (CALCIUM + D PO)    Take 1 tablet by mouth 3 (three) times daily.  ? CARBOXYMETHYLCELLULOSE SODIUM (THERATEARS) 0.25 % SOLN    Place 1 drop into both eyes 4 (four) times daily.  ? CHOLECALCIFEROL (VITAMIN D3) LIQD    Take 1,000 Units by mouth every Monday, Tuesday, Wednesday, Thursday, and Friday.  ?  EVENING PRIMROSE OIL 1000 MG CAPS    Take 1,000 mg by mouth every evening.  ? FEMRING 0.05 MG/24HR RING    Place 1 each vaginally every 3 (three) months.  ? GABAPENTIN (NEURONTIN) 100 MG CAPSULE    Take 200 mg by mouth at bedtime.   ? LUTEIN 20 MG TABS    Take 20 mg by mouth daily.   ? MAGNESIUM 300 MG CAPS    Take 300 mg by mouth at bedtime.  ? MECLIZINE (ANTIVERT) 12.5 MG TABLET    Take 1-2 tablets (12.5-25 mg total) by mouth 3 (three) times daily as needed for dizziness.  ? METHOCARBAMOL (ROBAXIN) 750 MG TABLET    Take 750 mg by mouth every 6 (six) hours.  ? METOPROLOL SUCCINATE (TOPROL XL) 25 MG 24 HR TABLET    Take 1 tablet (25 mg total) by mouth in the morning and at bedtime.  ? METOPROLOL TARTRATE (LOPRESSOR) 25 MG TABLET    Take 1/2 tablet by mouth daily as needed for palpitations  ? METRONIDAZOLE (METROGEL) 0.75 % GEL    Apply 1 application topically 2 (two) times daily as needed (rosacea).  ? MULTIPLE VITAMIN (MULTIVITAMIN WITH MINERALS) TABS TABLET    Take 1 tablet by mouth daily.  ? MULTIPLE VITAMINS-MINERALS (PRESERVISION AREDS 2) CAPS    Take 1 capsule by mouth 2 (two) times daily.  ? OMEGA-3 1000 MG CAPS    Take 1,000 mg by mouth 2 (two) times daily.  ? ONDANSETRON (ZOFRAN-ODT) 4 MG DISINTEGRATING TABLET    Take 1 tablet (4 mg total) by mouth every 6 (six) hours as needed for nausea or vomiting.  ? RIVAROXABAN (XARELTO) 20 MG TABS TABLET    Take 1 tablet (20 mg total) by mouth daily with supper.  ? SODIUM CHLORIDE 1 G TABLET    Take 1 tablet  (1 g total) by mouth 3 (three) times daily with meals.  ? UBRELVY 100 MG TABS    Take 1-2 tablets by mouth 2 (two) times daily as needed.  ?Modified Medications  ? No medications on file  ?Discontinued Medi

## 2021-07-15 NOTE — Patient Instructions (Addendum)
Can use tylenol 500 mg daily 2 tablets every 8 hours as needed for pain.  ? ?

## 2021-07-16 LAB — CBC WITH DIFFERENTIAL/PLATELET
Absolute Monocytes: 418 cells/uL (ref 200–950)
Basophils Absolute: 29 cells/uL (ref 0–200)
Basophils Relative: 0.6 %
Eosinophils Absolute: 67 cells/uL (ref 15–500)
Eosinophils Relative: 1.4 %
HCT: 41.4 % (ref 35.0–45.0)
Hemoglobin: 13.7 g/dL (ref 11.7–15.5)
Lymphs Abs: 725 cells/uL — ABNORMAL LOW (ref 850–3900)
MCH: 30.7 pg (ref 27.0–33.0)
MCHC: 33.1 g/dL (ref 32.0–36.0)
MCV: 92.8 fL (ref 80.0–100.0)
MPV: 10.7 fL (ref 7.5–12.5)
Monocytes Relative: 8.7 %
Neutro Abs: 3562 cells/uL (ref 1500–7800)
Neutrophils Relative %: 74.2 %
Platelets: 274 10*3/uL (ref 140–400)
RBC: 4.46 10*6/uL (ref 3.80–5.10)
RDW: 11.8 % (ref 11.0–15.0)
Total Lymphocyte: 15.1 %
WBC: 4.8 10*3/uL (ref 3.8–10.8)

## 2021-07-16 LAB — COMPLETE METABOLIC PANEL WITH GFR
AG Ratio: 1.8 (calc) (ref 1.0–2.5)
ALT: 13 U/L (ref 6–29)
AST: 19 U/L (ref 10–35)
Albumin: 4.4 g/dL (ref 3.6–5.1)
Alkaline phosphatase (APISO): 67 U/L (ref 37–153)
BUN/Creatinine Ratio: 35 (calc) — ABNORMAL HIGH (ref 6–22)
BUN: 17 mg/dL (ref 7–25)
CO2: 30 mmol/L (ref 20–32)
Calcium: 9.6 mg/dL (ref 8.6–10.4)
Chloride: 93 mmol/L — ABNORMAL LOW (ref 98–110)
Creat: 0.49 mg/dL — ABNORMAL LOW (ref 0.60–1.00)
Globulin: 2.4 g/dL (calc) (ref 1.9–3.7)
Glucose, Bld: 95 mg/dL (ref 65–139)
Potassium: 4.6 mmol/L (ref 3.5–5.3)
Sodium: 129 mmol/L — ABNORMAL LOW (ref 135–146)
Total Bilirubin: 0.4 mg/dL (ref 0.2–1.2)
Total Protein: 6.8 g/dL (ref 6.1–8.1)
eGFR: 98 mL/min/{1.73_m2} (ref >=60)

## 2021-07-16 LAB — VITAMIN D 25 HYDROXY (VIT D DEFICIENCY, FRACTURES): Vit D, 25-Hydroxy: 44 ng/mL (ref 30–100)

## 2021-07-19 ENCOUNTER — Other Ambulatory Visit: Payer: Self-pay | Admitting: Nurse Practitioner

## 2021-07-19 ENCOUNTER — Encounter: Payer: Self-pay | Admitting: Nurse Practitioner

## 2021-07-19 DIAGNOSIS — E871 Hypo-osmolality and hyponatremia: Secondary | ICD-10-CM

## 2021-07-19 NOTE — Progress Notes (Signed)
Carelink Summary Report / Loop Recorder 

## 2021-08-04 ENCOUNTER — Other Ambulatory Visit: Payer: Self-pay

## 2021-08-04 DIAGNOSIS — G43111 Migraine with aura, intractable, with status migrainosus: Secondary | ICD-10-CM

## 2021-08-04 MED ORDER — BUTALBITAL-APAP-CAFFEINE 50-325-40 MG PO TABS: 1.0000 | ORAL_TABLET | Freq: Four times a day (QID) | ORAL | 5 refills | Status: DC | PRN

## 2021-08-04 NOTE — Addendum Note (Signed)
Addended by: Lauree Chandler on: 08/04/2021 01:08 PM   Modules accepted: Orders

## 2021-08-04 NOTE — Addendum Note (Signed)
Addended by: Bing Matter A on: 08/04/2021 12:50 PM   Modules accepted: Orders

## 2021-08-04 NOTE — Telephone Encounter (Signed)
Patient requested Rx.   Please review and sign.

## 2021-08-08 ENCOUNTER — Ambulatory Visit (INDEPENDENT_AMBULATORY_CARE_PROVIDER_SITE_OTHER): Payer: Medicare Other

## 2021-08-08 DIAGNOSIS — I480 Paroxysmal atrial fibrillation: Secondary | ICD-10-CM

## 2021-08-08 LAB — CUP PACEART REMOTE DEVICE CHECK
Date Time Interrogation Session: 20230531230233
Implantable Pulse Generator Implant Date: 20221111

## 2021-08-16 ENCOUNTER — Other Ambulatory Visit: Payer: Self-pay | Admitting: Nurse Practitioner

## 2021-08-16 DIAGNOSIS — E871 Hypo-osmolality and hyponatremia: Secondary | ICD-10-CM

## 2021-08-17 ENCOUNTER — Other Ambulatory Visit: Payer: Self-pay | Admitting: Nurse Practitioner

## 2021-08-17 ENCOUNTER — Other Ambulatory Visit: Payer: Medicare Other

## 2021-08-17 DIAGNOSIS — R110 Nausea: Secondary | ICD-10-CM

## 2021-08-17 DIAGNOSIS — E871 Hypo-osmolality and hyponatremia: Secondary | ICD-10-CM | POA: Diagnosis not present

## 2021-08-18 ENCOUNTER — Encounter: Payer: Self-pay | Admitting: Nurse Practitioner

## 2021-08-18 LAB — BASIC METABOLIC PANEL WITH GFR
BUN/Creatinine Ratio: 34 (calc) — ABNORMAL HIGH (ref 6–22)
BUN: 16 mg/dL (ref 7–25)
CO2: 31 mmol/L (ref 20–32)
Calcium: 9.5 mg/dL (ref 8.6–10.4)
Chloride: 95 mmol/L — ABNORMAL LOW (ref 98–110)
Creat: 0.47 mg/dL — ABNORMAL LOW (ref 0.60–1.00)
Glucose, Bld: 79 mg/dL (ref 65–139)
Potassium: 4.9 mmol/L (ref 3.5–5.3)
Sodium: 132 mmol/L — ABNORMAL LOW (ref 135–146)
eGFR: 99 mL/min/{1.73_m2} (ref >=60)

## 2021-08-24 NOTE — Progress Notes (Signed)
Carelink Summary Report / Loop Recorder 

## 2021-08-25 ENCOUNTER — Encounter: Payer: Self-pay | Admitting: Nurse Practitioner

## 2021-08-25 DIAGNOSIS — E871 Hypo-osmolality and hyponatremia: Secondary | ICD-10-CM

## 2021-08-25 NOTE — Telephone Encounter (Signed)
Message forwarded to Lauree Chandler, NP

## 2021-08-26 ENCOUNTER — Other Ambulatory Visit: Payer: Medicare Other

## 2021-09-11 LAB — CUP PACEART REMOTE DEVICE CHECK
Date Time Interrogation Session: 20230703230853
Implantable Pulse Generator Implant Date: 20221111

## 2021-09-12 ENCOUNTER — Ambulatory Visit (INDEPENDENT_AMBULATORY_CARE_PROVIDER_SITE_OTHER): Payer: Medicare Other

## 2021-09-12 DIAGNOSIS — I480 Paroxysmal atrial fibrillation: Secondary | ICD-10-CM

## 2021-10-03 ENCOUNTER — Other Ambulatory Visit (HOSPITAL_COMMUNITY): Payer: Self-pay | Admitting: Internal Medicine

## 2021-10-03 ENCOUNTER — Telehealth (HOSPITAL_COMMUNITY): Payer: Self-pay

## 2021-10-03 ENCOUNTER — Telehealth: Payer: Self-pay | Admitting: Internal Medicine

## 2021-10-03 NOTE — Telephone Encounter (Signed)
Patient called stating that her PVCs are worse today. She stated that she taking Metoprolol '25mg'$  twice daily however its making her dizzy, nightmares, sweating. Discuss with Audry Pili he stated she can decrease to 12.'5mg'$  twice daily. She wants to discontinue medication all together. Loop recorder shows no Afib and 3% PVC burden. Reached out to patient to let her know Doylene Canning will add her to EP APP schedule.

## 2021-10-03 NOTE — Telephone Encounter (Signed)
Pt advised to decrease metoprolol by afib clinic.  Pt scheduled to see Dr. Rayann Heman 10/06/21 to discuss further.

## 2021-10-03 NOTE — Telephone Encounter (Signed)
New message  Pt c/o medication issue:  1. Name of Medication: metropolol   2. How are you currently taking this medication (dosage and times per day)? 25 mg twice daily  3. Are you having a reaction (difficulty breathing--STAT)?   4. What is your medication issue? Pt states she has been having sweats, nightmares and dizziness. And she believes it is from the medication and would like to discuss changing it.  She is scheduled for an appt on 10/12/21 with RU. She wanted something sooner, so she was added to waitlist.

## 2021-10-03 NOTE — Telephone Encounter (Signed)
Prescription refill request for Xarelto received.  Indication: PAF Last office visit: 04/04/21  C Fenton PA Weight: 49.5kg Age: 76 Scr: 0.47 on 08/17/21 CrCl: 79.57  Based on above findings Xarelto '20mg'$  daily is the appropriate dose.  Refill approved.

## 2021-10-06 ENCOUNTER — Encounter (HOSPITAL_BASED_OUTPATIENT_CLINIC_OR_DEPARTMENT_OTHER): Payer: Self-pay | Admitting: Internal Medicine

## 2021-10-06 ENCOUNTER — Telehealth: Payer: Self-pay | Admitting: Internal Medicine

## 2021-10-06 ENCOUNTER — Ambulatory Visit (INDEPENDENT_AMBULATORY_CARE_PROVIDER_SITE_OTHER): Payer: Medicare Other | Admitting: Internal Medicine

## 2021-10-06 VITALS — BP 102/64 | HR 79 | Ht 66.0 in | Wt 106.0 lb

## 2021-10-06 DIAGNOSIS — I480 Paroxysmal atrial fibrillation: Secondary | ICD-10-CM | POA: Diagnosis not present

## 2021-10-06 DIAGNOSIS — I483 Typical atrial flutter: Secondary | ICD-10-CM | POA: Diagnosis not present

## 2021-10-06 MED ORDER — METOPROLOL SUCCINATE ER 25 MG PO TB24: 25.0000 mg | ORAL_TABLET | Freq: Every evening | ORAL | 0 refills | Status: DC

## 2021-10-06 MED ORDER — METOPROLOL SUCCINATE ER 25 MG PO TB24: 12.5000 mg | ORAL_TABLET | Freq: Every evening | ORAL | 1 refills | Status: DC

## 2021-10-06 NOTE — Patient Instructions (Addendum)
Medication Instructions:  Your physician has recommended you make the following change in your medication:   Medication decrease:  Toprol XL 25 Mg-  Take one tablet by mouth every evening.  IN 4 WEEKS, August 31st: DECREASE Toprol XL 25 Mg again: to 12.5 Mg;  Take 1/2 tablet by mouth every evening.    Lab Work:  You will have blood work drawn today: CBC, BMET, Magnesium, and TSH   Testing/Procedures: None ordered.  Follow-Up: At [facility], you and your health needs are our priority.  As part of our continuing mission to provide you with exceptional heart care, we have created designated Provider Care Teams.  These Care Teams include your primary Cardiologist (physician) and Advanced Practice Providers (APPs -  Physician Assistants and Nurse Practitioners) who all work together to provide you with the care you need, when you need it.  Your next appointment:    Your physician wants you to follow-up in: follow up 12/09/2021 Alicia [name].   You will receive a reminder letter in the mail two months in advance. If you don't receive a letter, please call our office to schedule the follow-up appointment.   Important Information About Sugar

## 2021-10-06 NOTE — Telephone Encounter (Signed)
Pt called to say that our office sent in Rx for the medication change, but that we didn't need to as she had plenty on hand at home to get her through the weaning of this. States that pharmacy is alerting her to fill/pick up Rx that she doesn't need. Aware I will call them to inform. Pt appreciative of the help. Called pharmacy and informed them that pt did not need Rx filled, explained she is being weaned off this medication and has enough on hand currently. Pharmacy will keep on file but not fill/refill.

## 2021-10-06 NOTE — Progress Notes (Signed)
PCP: Lauree Chandler, NP   Primary EP: Dr Blondell Reveal is a 76 y.o. female who presents today for routine electrophysiology followup.  Since last being seen in our clinic, the patient reports doing reasonably well.  No afib.  She reports frequent dizziness, palpitations, and weakness.  She is often unsteady.  Today, she denies symptoms of chest pain, shortness of breath,  lower extremity edema, or syncope.  The patient is otherwise without complaint today.   Past Medical History:  Diagnosis Date   Bronchitis    Cancer ([place]) 2009   Basal Cell   Chronic migraine without aura, intractable, without status migrainosus    Diverticulosis 2010   Endometriosis    Low sodium levels    Malnutrition ([facility])    MD (muscular dystrophy) ([place])    Meniere's disease, bilateral    [facility]   Peripheral neuropathy    Dr Erling Cruz   Pneumonia    Sensorineural hearing loss, bilateral    Vertigo 2019   Past Surgical History:  Procedure Laterality Date   ABDOMINAL HYSTERECTOMY  1980 or 1981   for Endometriosis   APPENDECTOMY  1965   high school; low grade appendiceal cancer   ATRIAL FIBRILLATION ABLATION N/A 06/17/2020   Procedure: ATRIAL FIBRILLATION ABLATION;  Surgeon: Thompson Grayer, MD;  Location: [place] [facility];  Service: Cardiovascular;  Laterality: N/A;   BREAST ENHANCEMENT SURGERY Bilateral 1979   COLONOSCOPY  01/2012   Tics; Dr Teena Irani   ESI      X 3 in 2003 & 02/17/2011 for L4-S1 symptoms   implantable loop recorder placement  01/14/2021   Medtronic Reveal Linq model LNQ 22 ([place] [id number]) implantable loop recorder   LUMBAR LAMINECTOMY  12/2012   [place]-S , [place]   TONSILLECTOMY  1950 or [place]     vocal cords stripped  1970    ROS- all systems are reviewed and negatives except as per HPI above  Current Outpatient Medications  Medication Sig Dispense Refill   acetaminophen (TYLENOL) 500 MG tablet Take 500 mg by mouth at  bedtime as needed for moderate pain.     AZELAIC ACID EX Apply 1 application topically 2 (two) times daily as needed (rosacea). Azelaic Acid 10%  use right after metronidazole cream     baclofen (LIORESAL) 10 MG tablet Take 10 mg by mouth at bedtime.     Biotin 5000 MCG TABS Take 5,000 mcg by mouth daily.      butalbital-acetaminophen-caffeine (FIORICET) 50-325-40 MG tablet Take 1 tablet by mouth every 6 (six) hours as needed for migraine. 30 tablet 5   Calcium Citrate-Vitamin D (CALCIUM + D PO) Take 1 tablet by mouth 3 (three) times daily.     Carboxymethylcellulose Sodium (THERATEARS) 0.25 % SOLN Place 1 drop into both eyes 4 (four) times daily.     Cholecalciferol (VITAMIN D3) LIQD Take 1,000 Units by mouth every Monday, Tuesday, Wednesday, Thursday, and Friday.     Evening Primrose Oil 1000 MG CAPS Take 1,000 mg by mouth every evening.     FEMRING 0.05 MG/24HR RING Place 1 each vaginally every 3 (three) months.     gabapentin (NEURONTIN) 100 MG capsule Take 100 mg by mouth at bedtime.     Lutein 20 MG TABS Take 20 mg by mouth daily.      Magnesium 300 MG CAPS Take 300 mg by mouth at bedtime.  meclizine (ANTIVERT) 12.5 MG tablet Take 1-2 tablets (12.5-25 mg total) by mouth 3 (three) times daily as needed for dizziness. 60 tablet 0   methocarbamol (ROBAXIN) 750 MG tablet Take 750 mg by mouth every 6 (six) hours.     metoprolol succinate (TOPROL XL) 25 MG 24 hr tablet Take 1 tablet (25 mg total) by mouth in the morning and at bedtime. 180 tablet 3   metoprolol tartrate (LOPRESSOR) 25 MG tablet Take 1/2 tablet by mouth daily as needed for palpitations 30 tablet 1   metroNIDAZOLE (METROGEL) 0.75 % gel Apply 1 application topically 2 (two) times daily as needed (rosacea).     Multiple Vitamin (MULTIVITAMIN WITH MINERALS) TABS tablet Take 1 tablet by mouth daily.     Multiple Vitamins-Minerals (PRESERVISION AREDS 2) CAPS Take 1 capsule by mouth 2 (two) times daily.     Omega-3 1000 MG CAPS Take  1,000 mg by mouth 2 (two) times daily.     ondansetron (ZOFRAN-ODT) 4 MG disintegrating tablet DISSOLVE 1 TABLET(4 MG) ON THE TONGUE EVERY 6 HOURS AS NEEDED FOR NAUSEA OR VOMITING 60 tablet 1   rivaroxaban (XARELTO) 20 MG TABS tablet TAKE 1 TABLET DAILY WITH SUPPER 90 tablet 1   sodium chloride 1 g tablet Take 1 tablet (1 g total) by mouth 3 (three) times daily with meals. 90 tablet 1   UBRELVY 100 MG TABS Take 1-2 tablets by mouth 2 (two) times daily as needed.     No current facility-administered medications for this visit.    Physical Exam: Vitals:   10/06/21 1034  BP: 102/64  Pulse: 79  Weight: 106 lb (48.1 kg)  Height: '5\' 6"'$  (1.676 m)   GEN- The patient is well appearing, alert and oriented x 3 today.   Head- normocephalic, atraumatic Eyes-  Sclera clear, conjunctiva pink Ears- hearing intact Oropharynx- clear Lungs- Clear to ausculation bilaterally, normal work of breathing Heart- Regular rate and rhythm, no murmurs, rubs or gallops, PMI not laterally displaced GI- soft, NT, ND, + BS Extremities- no clubbing, cyanosis, or edema Psych- very anxious (chronic) appearing  Wt Readings from Last 3 Encounters:  10/06/21 106 lb (48.1 kg)  07/15/21 110 lb (49.9 kg)  04/15/21 109 lb 12.8 oz (49.8 kg)    EKG tracing ordered today is personally reviewed and shows sinus rhythm with PVCs, LA enlargement  Assessment and Plan:  Paroxysmal atrial fibrillation Well controlled post ablation (No afib by ILR) She has palpitations which appear to be related to PVCs.  She is very anxious chronically.  She worries that her metoprolol may be contributing to dizziness.  I will reduce toprol to '25mg'$  qpm today and then 12.'5mg'$  pm in 4 weeks.   Bmet, cbc today Continue xarelto  2. PVCs Very stable, with burden of 3% Patient reassured today As above Too infrequent for ablation.  I doubt she would tolerate AADs Weaning metoprolol as above Could consider nadolol Check tsh, mg, bmet  today  3. Dizziness As above Bp is low today.  Adequate hydration and salt liberalization is advised Bmet, cbc and tsh today She is on numerous other medicines (muscle relaxants, neurontin, etc) which could be contributing.  I have advised follow-up with her neurologist and PCP Continue to follow ILR (Brady/ pause turned on today).  She has been previously scheduled to see Dr Quentin Ore in October.  Thompson Grayer MD, [facility] 10/06/2021 11:13 AM

## 2021-10-06 NOTE — Telephone Encounter (Signed)
Want to discuss medication. Please advise

## 2021-10-07 LAB — BASIC METABOLIC PANEL
BUN/Creatinine Ratio: 29 — ABNORMAL HIGH (ref 12–28)
BUN: 16 mg/dL (ref 8–27)
CO2: 25 mmol/L (ref 20–29)
Calcium: 9.8 mg/dL (ref 8.7–10.3)
Chloride: 92 mmol/L — ABNORMAL LOW (ref 96–106)
Creatinine, Ser: 0.56 mg/dL — ABNORMAL LOW (ref 0.57–1.00)
Glucose: 132 mg/dL — ABNORMAL HIGH (ref 70–99)
Potassium: 5.1 mmol/L (ref 3.5–5.2)
Sodium: 130 mmol/L — ABNORMAL LOW (ref 134–144)
eGFR: 95 mL/min/{1.73_m2} (ref >59)

## 2021-10-07 LAB — CBC WITH DIFFERENTIAL/PLATELET
Basophils Absolute: 0 10*3/uL (ref 0.0–0.2)
Basos: 0 %
EOS (ABSOLUTE): 0.1 10*3/uL (ref 0.0–0.4)
Eos: 1 %
Hematocrit: 42.3 % (ref 34.0–46.6)
Hemoglobin: 14.3 g/dL (ref 11.1–15.9)
Immature Grans (Abs): 0 10*3/uL (ref 0.0–0.1)
Immature Granulocytes: 0 %
Lymphocytes Absolute: 0.7 10*3/uL (ref 0.7–3.1)
Lymphs: 9 %
MCH: 30.8 pg (ref 26.6–33.0)
MCHC: 33.8 g/dL (ref 31.5–35.7)
MCV: 91 fL (ref 79–97)
Monocytes Absolute: 0.6 10*3/uL (ref 0.1–0.9)
Monocytes: 8 %
Neutrophils Absolute: 5.9 10*3/uL (ref 1.4–7.0)
Neutrophils: 82 %
Platelets: 319 10*3/uL (ref 150–450)
RBC: 4.65 x10E6/uL (ref 3.77–5.28)
RDW: 12.2 % (ref 11.7–15.4)
WBC: 7.3 10*3/uL (ref 3.4–10.8)

## 2021-10-07 LAB — TSH: TSH: 3.54 u[IU]/mL (ref 0.450–4.500)

## 2021-10-07 LAB — MAGNESIUM: Magnesium: 2.4 mg/dL — ABNORMAL HIGH (ref 1.6–2.3)

## 2021-10-12 ENCOUNTER — Encounter: Payer: Medicare Other | Admitting: Physician Assistant

## 2021-10-12 ENCOUNTER — Other Ambulatory Visit: Payer: Self-pay | Admitting: Nurse Practitioner

## 2021-10-12 DIAGNOSIS — R42 Dizziness and giddiness: Secondary | ICD-10-CM

## 2021-10-12 LAB — CUP PACEART REMOTE DEVICE CHECK
Date Time Interrogation Session: 20230805230753
Implantable Pulse Generator Implant Date: 20221111

## 2021-10-12 NOTE — Progress Notes (Signed)
Carelink Summary Report / Loop Recorder 

## 2021-10-13 NOTE — Telephone Encounter (Signed)
Patient has request refill on medication Meclizine 12.'5mg'$ . Medication last refilled 08/18/2020 with 0 refills. Medication pend and sent to PCP Dewaine Oats Carlos American, NP for approval.

## 2021-10-14 NOTE — Telephone Encounter (Signed)
Result reviewed.  Please make sure patient has access to result through mychart or receives notification.  Orders Per Dr. Lenna Sciara. Allred  Pt called and shared BMET results.  The only value that was elevated compared to past lab results was the BGL.  Pt stated that she drank a sugary protein drink prior to her visit to see Dr. Rayann Heman, and when the BMET was ordered, was hungry, so she ate some [employer] candies.   I believe this was the cause of the BGL increase as noted on the BMET.   Pt stated she saw results on MyChart, and has no futher questions regarding POC.  She was asymptomatic at time of call, no f/u req.

## 2021-10-17 ENCOUNTER — Ambulatory Visit: Payer: Medicare Other

## 2021-10-19 DIAGNOSIS — L821 Other seborrheic keratosis: Secondary | ICD-10-CM | POA: Diagnosis not present

## 2021-10-19 DIAGNOSIS — L820 Inflamed seborrheic keratosis: Secondary | ICD-10-CM | POA: Diagnosis not present

## 2021-10-19 DIAGNOSIS — D485 Neoplasm of uncertain behavior of skin: Secondary | ICD-10-CM | POA: Diagnosis not present

## 2021-10-19 DIAGNOSIS — L570 Actinic keratosis: Secondary | ICD-10-CM | POA: Diagnosis not present

## 2021-10-19 DIAGNOSIS — Z85828 Personal history of other malignant neoplasm of skin: Secondary | ICD-10-CM | POA: Diagnosis not present

## 2021-10-28 ENCOUNTER — Encounter: Payer: Self-pay | Admitting: Nurse Practitioner

## 2021-10-28 ENCOUNTER — Ambulatory Visit (INDEPENDENT_AMBULATORY_CARE_PROVIDER_SITE_OTHER): Payer: Medicare Other | Admitting: Nurse Practitioner

## 2021-10-28 VITALS — BP 116/74 | HR 88 | Temp 96.9°F | Ht 66.0 in | Wt 112.2 lb

## 2021-10-28 DIAGNOSIS — R110 Nausea: Secondary | ICD-10-CM | POA: Diagnosis not present

## 2021-10-28 DIAGNOSIS — G7100 Muscular dystrophy, unspecified: Secondary | ICD-10-CM | POA: Diagnosis not present

## 2021-10-28 DIAGNOSIS — G43111 Migraine with aura, intractable, with status migrainosus: Secondary | ICD-10-CM | POA: Diagnosis not present

## 2021-10-28 DIAGNOSIS — E559 Vitamin D deficiency, unspecified: Secondary | ICD-10-CM | POA: Diagnosis not present

## 2021-10-28 DIAGNOSIS — R42 Dizziness and giddiness: Secondary | ICD-10-CM | POA: Diagnosis not present

## 2021-10-28 DIAGNOSIS — E871 Hypo-osmolality and hyponatremia: Secondary | ICD-10-CM | POA: Diagnosis not present

## 2021-10-28 DIAGNOSIS — I480 Paroxysmal atrial fibrillation: Secondary | ICD-10-CM

## 2021-10-28 MED ORDER — ONDANSETRON 4 MG PO TBDP: ORAL_TABLET | ORAL | 1 refills | Status: AC

## 2021-10-28 NOTE — Patient Instructions (Addendum)
Biotene mouth wash for dry mouth

## 2021-10-28 NOTE — Progress Notes (Unsigned)
Careteam: Patient Care Team: Lauree Chandler, NP as PCP - General (Geriatric Medicine) Deliah Goody, PA-C as Consulting Physician (Physician Assistant) Danella Sensing, MD as Consulting Physician (Dermatology) Everett Graff, MD as Consulting Physician (Obstetrics and Gynecology) Vicie Mutters, MD as Consulting Physician (Otolaryngology) Marshell Garfinkel, MD as Consulting Physician (Pulmonary Disease) Gayla Medicus, MD as Referring Physician (Neurology)  PLACE OF SERVICE:  [place]  Advanced Directive information    Allergies  Allergen Reactions   Biaxin [Clarithromycin] Anaphylaxis   Sulfonamide Derivatives Itching and Rash   Tequin [Gatifloxacin] Anaphylaxis   Demerol Nausea And Vomiting   Lactose Intolerance (Gi) Other (See Comments)    Mild abdominal pain, gas, bloating   Nsaids Other (See Comments)    GI upset/stomach pain   Risedronate Sodium Nausea And Vomiting and Other (See Comments)     GI upset/heartburn    Rofecoxib Nausea And Vomiting and Other (See Comments)    GI upset/heartburn    Alendronate Sodium Nausea And Vomiting   Aspirin Other (See Comments)    Abdominal pain    Cardizem [Diltiazem Hcl] Hives   Eliquis [Apixaban] Hives   Fish Allergy Nausea And Vomiting    Can take fish oil   Benadryl [Diphenhydramine Hcl] Palpitations   Other Itching and Rash    EKG LEADS caused burning sores     Chief Complaint  Patient presents with   Medical Management of Chronic Issues    Four month follow up. Discuss need for shingles and flu vaccine. Patient mentioned PVC's and vertigo      HPI: Patient is a 76 y.o. female for routine follow up.  She has been having awful vertigo this week. Reports she has been on the verge on it all day.  Has been using phenergan bc zofran has not been effective.  Using meclizine when she has episode, in the past she used when she felt like one was coming on but has not taken recently.   She has seen nephrologist in the past  and told her she was drinking too much water.  They did not feel like they needed to see her again.   Cardiologist felt like she needed to increase her water intake.    PVCs unchanged, she has a titration for her metoprolol - hoping decreasing metoprolol helps the vertigo.  PT has been made symptoms worse.   Review of Systems:  Review of Systems  Constitutional:  Negative for chills, fever and weight loss.  HENT:  Negative for tinnitus.   Respiratory:  Negative for cough, sputum production and shortness of breath.   Cardiovascular:  Negative for chest pain, palpitations and leg swelling.  Gastrointestinal:  Negative for abdominal pain, constipation, diarrhea and heartburn.  Genitourinary:  Negative for dysuria, frequency and urgency.  Musculoskeletal:  Negative for back pain, falls, joint pain and myalgias.  Skin: Negative.   Neurological:  Negative for dizziness and headaches.  Psychiatric/Behavioral:  Negative for depression and memory loss. The patient does not have insomnia.     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   Chronic migraine without aura, intractable, without status migrainosus    Diverticulosis 2010   Endometriosis    Low sodium levels    Malnutrition (HCC)    MD (muscular dystrophy) ([place])    Meniere's disease, bilateral    [facility]   Peripheral neuropathy    Dr Erling Cruz  Pneumonia    Sensorineural hearing loss, bilateral    Vertigo 2019   Past Surgical History:  Procedure Laterality Date   ABDOMINAL HYSTERECTOMY  1980 or 1981   for Endometriosis   APPENDECTOMY  1965   high school; low grade appendiceal cancer   ATRIAL FIBRILLATION ABLATION N/A 06/17/2020   Procedure: ATRIAL FIBRILLATION ABLATION;  Surgeon: Thompson Grayer, MD;  Location: [facility];  Service: Cardiovascular;  Laterality: N/A;   BREAST ENHANCEMENT SURGERY Bilateral 1979   COLONOSCOPY  01/2012   Tics; Dr Teena Irani   ESI      X 3 in 2003 & 02/17/2011 for L4-S1 symptoms   implantable loop recorder placement  01/14/2021   Medtronic Reveal Linq model LNQ 22 ([place] [id number]) implantable loop recorder   LUMBAR LAMINECTOMY  12/2012   W-S ,    TONSILLECTOMY  1950 or 1951   TUBAL LIGATION     vocal cords stripped  1970   Social History:   reports that she has never smoked. She has never used smokeless tobacco. She reports that she does not currently use alcohol. She reports that she does not use drugs.  Family History  Problem Relation Age of Onset   Alcohol abuse Mother    Hypertension Mother    Diabetes Mother 63   Stroke Mother 72   Dementia Mother    Kidney failure Mother    Osteoporosis Mother    Alzheimer's disease Father 9   Cancer Maternal Aunt 71       pancreatic   Arthritis Sister 32       knee replacement   Hyperlipidemia Sister    Lung disease Maternal Grandmother    Lung disease Maternal Grandfather    Hyperlipidemia Sister 38   Other Son        recent back injury   Other Daughter        recent back injury; knee issues when playing tennis   Heart disease Neg Hx    Heart attack Neg Hx     Medications: Patient's Medications  New Prescriptions   No medications on file  Previous Medications   ACETAMINOPHEN (TYLENOL) 500 MG TABLET    Take 500 mg by mouth at bedtime as needed for moderate pain.   BACLOFEN (LIORESAL) 10 MG TABLET    Take 10 mg by mouth at bedtime.   BUTALBITAL-ACETAMINOPHEN-CAFFEINE (FIORICET) 50-325-40 MG TABLET    Take 1 tablet by mouth every 6 (six) hours as needed for migraine.   CALCIUM CITRATE-VITAMIN D (CALCIUM + D PO)    Take 1 tablet by mouth as directed. 3-4 times daily   CARBOXYMETHYLCELLULOSE SODIUM (THERATEARS) 0.25 % SOLN    Place 1 drop into both eyes 4 (four) times daily.   CHOLECALCIFEROL (VITAMIN D3) LIQD    Take 1,000 Units by mouth daily.   EVENING PRIMROSE OIL 1000 MG CAPS    Take 1,000 mg by mouth every evening.   FEMRING 0.05  MG/24HR RING    Place 1 each vaginally every 3 (three) months.   GABAPENTIN (NEURONTIN) 100 MG CAPSULE    Take 100 mg by mouth at bedtime.   LUTEIN 20 MG TABS    Take 20 mg by mouth daily.    MAGNESIUM CITRATE PO    Take 300 mg by mouth daily.   MECLIZINE (ANTIVERT) 12.5 MG TABLET    TAKE 1 TO 2 TABLETS(12.5 TO 25 MG) BY MOUTH THREE TIMES DAILY AS NEEDED FOR DIZZINESS   METHOCARBAMOL (  ROBAXIN) 750 MG TABLET    Take 750 mg by mouth every 6 (six) hours.   METOPROLOL SUCCINATE (TOPROL XL) 25 MG 24 HR TABLET    Take 1 tablet (25 mg total) by mouth every evening for 28 days.   METOPROLOL SUCCINATE (TOPROL XL) 25 MG 24 HR TABLET    Take 0.5 tablets (12.5 mg total) by mouth every evening. Pt should begin this dose in 4 Weeks; 11/03/2021;  Toprol XL 12.5 mg- Take half tablet by mouth every evening;   MULTIPLE VITAMIN (MULTIVITAMIN WITH MINERALS) TABS TABLET    Take 1 tablet by mouth daily.   MULTIPLE VITAMINS-MINERALS (PRESERVISION AREDS 2) CAPS    Take 1 capsule by mouth 2 (two) times daily.   OMEGA-3 1000 MG CAPS    Take 1,000 mg by mouth 2 (two) times daily.   RIVAROXABAN (XARELTO) 20 MG TABS TABLET    TAKE 1 TABLET DAILY WITH SUPPER   SODIUM CHLORIDE 1 G TABLET    Take 1 g by mouth 4 (four) times daily.   UBRELVY 100 MG TABS    Take 1-2 tablets by mouth 2 (two) times daily as needed.  Modified Medications   Modified Medication Previous Medication   ONDANSETRON (ZOFRAN-ODT) 4 MG DISINTEGRATING TABLET ondansetron (ZOFRAN-ODT) 4 MG disintegrating tablet      DISSOLVE 1 TABLET(4 MG) ON THE TONGUE EVERY 6 HOURS AS NEEDED FOR NAUSEA OR VOMITING    DISSOLVE 1 TABLET(4 MG) ON THE TONGUE EVERY 6 HOURS AS NEEDED FOR NAUSEA OR VOMITING  Discontinued Medications   AZELAIC ACID EX    Apply 1 application topically 2 (two) times daily as needed (rosacea). Azelaic Acid 10%  use right after metronidazole cream   BIOTIN 5000 MCG TABS    Take 5,000 mcg by mouth daily.    MAGNESIUM 300 MG CAPS    Take 300 mg by mouth at  bedtime.   METOPROLOL TARTRATE (LOPRESSOR) 25 MG TABLET    Take 1/2 tablet by mouth daily as needed for palpitations   METRONIDAZOLE (METROGEL) 0.75 % GEL    Apply 1 application topically 2 (two) times daily as needed (rosacea).   SODIUM CHLORIDE 1 G TABLET    Take 1 tablet (1 g total) by mouth 3 (three) times daily with meals.    Physical Exam:  Vitals:   10/28/21 1529  BP: 116/74  Pulse: 88  Temp: (!) 96.9 F (36.1 C)  TempSrc: Skin  SpO2: 99%  Weight: 112 lb 3.2 oz (50.9 kg)  Height: '5\' 6"'$  (1.676 m)   Body mass index is 18.11 kg/m. Wt Readings from Last 3 Encounters:  10/28/21 112 lb 3.2 oz (50.9 kg)  10/06/21 106 lb (48.1 kg)  07/15/21 110 lb (49.9 kg)    Physical Exam Constitutional:      General: She is not in acute distress.    Appearance: She is well-developed. She is not diaphoretic.  HENT:     Head: Normocephalic and atraumatic.     Mouth/Throat:     Pharynx: No oropharyngeal exudate.  Eyes:     Conjunctiva/sclera: Conjunctivae normal.     Pupils: Pupils are equal, round, and reactive to light.  Cardiovascular:     Rate and Rhythm: Normal rate and regular rhythm.     Heart sounds: Normal heart sounds.  Pulmonary:     Effort: Pulmonary effort is normal.     Breath sounds: Normal breath sounds.  Abdominal:     General: Bowel sounds are normal.  Palpations: Abdomen is soft.  Musculoskeletal:     Cervical back: Normal range of motion and neck supple.     Right lower leg: No edema.     Left lower leg: No edema.  Skin:    General: Skin is warm and dry.  Neurological:     Mental Status: She is alert.  Psychiatric:        Mood and Affect: Mood normal.     Labs reviewed: Basic Metabolic Panel: Recent Labs    07/15/21 1519 08/17/21 1339 10/06/21 1149  NA 129* 132* 130*  K 4.6 4.9 5.1  CL 93* 95* 92*  CO2 '30 31 25  '$ GLUCOSE 95 79 132*  BUN '17 16 16  '$ CREATININE 0.49* 0.47* 0.56*  CALCIUM 9.6 9.5 9.8  MG  --   --  2.4*  TSH  --   --  3.540    Liver Function Tests: Recent Labs    11/05/20 1605 07/15/21 1519  AST 19 19  ALT 14 13  BILITOT 0.4 0.4  PROT 6.7 6.8   No results for input(s): "LIPASE", "AMYLASE" in the last 8760 hours. No results for input(s): "AMMONIA" in the last 8760 hours. CBC: Recent Labs    11/05/20 1605 07/15/21 1519 10/06/21 1149  WBC 5.9 4.8 7.3  NEUTROABS 4,956 3,562 5.9  HGB 13.2 13.7 14.3  HCT 39.2 41.4 42.3  MCV 91.2 92.8 91  PLT 312 274 319   Lipid Panel: No results for input(s): "CHOL", "HDL", "LDLCALC", "TRIG", "CHOLHDL", "LDLDIRECT" in the last 8760 hours. TSH: Recent Labs    10/06/21 1149  TSH 3.540   A1C: Lab Results  Component Value Date   HGBA1C 5.6 03/03/2011     Assessment/Plan 1. Chronic hyponatremia -ongoing, continues with salt tablets and restricting water intake.  -sodium level stable at this time.   2. Intractable migraine with aura with status migrainosus -has been controlled. Followed by neurology and using ubrelvy.  -now using fioricet for failure for ubrelvy  3. Paroxysmal atrial fibrillation (HCC) -ongoing, cardiology is titrating down on metoprolol to hopefully help with vertigo.  Rate controlled on current dose and continues on xarelto for anticoagulation .   4. Muscular dystrophy ([place]) Ongoing, stable at this time.   5. Vitamin D deficiency -continue supplement, recheck Vit D level with next blood work.  6. Dizziness -ongoing, hoping titration of metoprolol will help wit symtoms. -can use meclizine PRN  7. Nausea -due to dizziness.  - ondansetron (ZOFRAN-ODT) 4 MG disintegrating tablet; DISSOLVE 1 TABLET(4 MG) ON THE TONGUE EVERY 6 HOURS AS NEEDED FOR NAUSEA OR VOMITING  Dispense: 60 tablet; Refill: 1   Return in about 4 months (around 02/27/2022) for routine follow up . Carlos American. [place], [facility] [phone]

## 2021-11-21 ENCOUNTER — Ambulatory Visit (INDEPENDENT_AMBULATORY_CARE_PROVIDER_SITE_OTHER): Payer: Medicare Other

## 2021-11-21 DIAGNOSIS — I480 Paroxysmal atrial fibrillation: Secondary | ICD-10-CM

## 2021-11-21 LAB — CUP PACEART REMOTE DEVICE CHECK
Date Time Interrogation Session: 20230917230414
Implantable Pulse Generator Implant Date: 20221111

## 2021-11-24 DIAGNOSIS — G7100 Muscular dystrophy, unspecified: Secondary | ICD-10-CM | POA: Diagnosis not present

## 2021-11-24 DIAGNOSIS — G43111 Migraine with aura, intractable, with status migrainosus: Secondary | ICD-10-CM | POA: Diagnosis not present

## 2021-11-24 DIAGNOSIS — R42 Dizziness and giddiness: Secondary | ICD-10-CM | POA: Diagnosis not present

## 2021-12-03 NOTE — Progress Notes (Signed)
Carelink Summary Report / Loop Recorder 

## 2021-12-08 NOTE — Progress Notes (Deleted)
Electrophysiology Office Follow up Visit Note:    Date:  12/08/2021   ID:  Alicia [name], DOB January 15, 1946, MRN [id number]  PCP:  Lauree Chandler, NP  [facility] Cardiologist:  None  [facility] Electrophysiologist:  None    Interval History:    Alicia [name] is a 76 y.o. female who presents for a follow up visit.  She was previously followed by Dr. Rayann Heman and saw him last October 06, 2021.  The patient has a history of paroxysmal atrial fibrillation and PVCs.  She has had her post ablation atrial fibrillation burden monitored by loop recorder.  Her metoprolol was decreased at the last appointment.       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   Chronic migraine without aura, intractable, without status migrainosus    Diverticulosis 2010   Endometriosis    Low sodium levels    Malnutrition (HCC)    MD (muscular dystrophy) ([place])    Meniere's disease, bilateral    [facility]   Peripheral neuropathy    Dr Erling Cruz   Pneumonia    Sensorineural hearing loss, bilateral    Vertigo 2019    Past Surgical History:  Procedure Laterality Date   ABDOMINAL HYSTERECTOMY  1980 or 1981   for Endometriosis   APPENDECTOMY  1965   high school; low grade appendiceal cancer   ATRIAL FIBRILLATION ABLATION N/A 06/17/2020   Procedure: ATRIAL FIBRILLATION ABLATION;  Surgeon: Thompson Grayer, MD;  Location: [facility];  Service: Cardiovascular;  Laterality: N/A;   BREAST ENHANCEMENT SURGERY Bilateral 1979   COLONOSCOPY  01/2012   Tics; Dr Teena Irani   ESI      X 3 in 2003 & 02/17/2011 for L4-S1 symptoms   implantable loop recorder placement  01/14/2021   Medtronic Reveal Linq model LNQ 22 ([place] [id number]) implantable loop recorder   LUMBAR LAMINECTOMY  12/2012   W-S , [place]   TONSILLECTOMY  1950 or [place]     vocal cords stripped  1970    Current Medications: No outpatient medications  have been marked as taking for the 12/09/21 encounter (Appointment) with [name], MD.     Allergies:   Biaxin [clarithromycin], Sulfonamide derivatives, Tequin [gatifloxacin], Demerol, Lactose intolerance (gi), Nsaids, Risedronate sodium, Rofecoxib, Alendronate sodium, Aspirin, Cardizem [diltiazem hcl], Eliquis [apixaban], Fish allergy, Benadryl [diphenhydramine hcl], and Other   Social History   Socioeconomic History   Marital status: Married    Spouse name: Not on file   Number of children: Not on file   Years of education: Not on file   Highest education level: Not on file  Occupational History   Not on file  Tobacco Use   Smoking status: Never   Smokeless tobacco: Never  Vaping Use   Vaping Use: Never used  Substance and Sexual Activity   Alcohol use: Not Currently   Drug use: Never   Sexual activity: Not Currently  Other Topics Concern   Not on file  Social History Narrative   Social History      Diet? Trying to gain weight      Do you drink/eat things with caffeine? 1 mug hot tea with breakfast per day      Marital status?  Married 49 years  What year were you married? 1970      Do you live in a house, apartment, assisted living, condo, trailer, etc.? House (46 years)      Is it one or more stories? 2 stories and walk up finished attic      How many persons live in your home? 2      Do you have any pets in your home? (please list) none      Highest level of education completed? 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     yes                                 Type & how often? Walk in neighborhood 4 - 6 times per week if possible      Advanced Directives (Lawyer just beginning to work on these things)      Do you have a living will?      Do you have a DNR form?                    no              If not, do you  want to discuss one?      Do you have signed POA/HPOA for forms?       Functional Status      Do you have difficulty bathing or dressing yourself? no      Do you have difficulty preparing food or eating? no      Do you have difficulty managing your medications? no      Do you have difficulty managing your finances? no      Do you have difficulty affording your medications? no   Social Determinants of Health   Financial Resource Strain: Not on file  Food Insecurity: Not on file  Transportation Needs: Not on file  Physical Activity: Not on file  Stress: Not on file  Social Connections: Not on file     Family History: The patient's family history includes Alcohol abuse in her mother; Alzheimer's disease (age of onset: 29) in her father; Arthritis (age of onset: 80) in her sister; Cancer (age of onset: 74) in her maternal aunt; Dementia in her mother; Diabetes (age of onset: 73) in her mother; Hyperlipidemia in her sister; Hyperlipidemia (age of onset: 66) in her sister; Hypertension in her mother; Kidney failure in her mother; Lung disease in her maternal grandfather and maternal grandmother; Osteoporosis in her mother; Other in her daughter and son; Stroke (age of onset: 9) in her mother. There is no history of Heart disease or Heart attack.  ROS:   Please see the history of present illness.    All other systems reviewed and are negative.  EKGs/Labs/Other Studies Reviewed:    The following studies were reviewed today: ***  EKG:  The ekg ordered today demonstrates ***  Recent Labs: 07/15/2021: ALT 13 10/06/2021: BUN 16; Creatinine, Ser 0.56; Hemoglobin 14.3; Magnesium 2.4; Platelets 319; Potassium 5.1; Sodium 130; TSH 3.540  Recent Lipid Panel    Component Value Date/Time   CHOL 192 03/19/2018 1005   TRIG 52 03/19/2018 1005   TRIG 46 01/29/2006 1132   HDL 88 03/19/2018 1005   CHOLHDL 2.2 03/19/2018 1005   VLDL 9.4 08/20/2012 1032  LDLCALC 91 03/19/2018 1005    LDLDIRECT 124.7 08/20/2012 1032    Physical Exam:    VS:  There were no vitals taken for this visit.    Wt Readings from Last 3 Encounters:  10/28/21 112 lb 3.2 oz (50.9 kg)  10/06/21 106 lb (48.1 kg)  07/15/21 110 lb (49.9 kg)     GEN: *** Well nourished, well developed in no acute distress HEENT: Normal NECK: No JVD; No carotid bruits LYMPHATICS: No lymphadenopathy CARDIAC: ***RRR, no murmurs, rubs, gallops RESPIRATORY:  Clear to auscultation without rales, wheezing or rhonchi  ABDOMEN: Soft, non-tender, non-distended MUSCULOSKELETAL:  No edema; No deformity  SKIN: Warm and dry NEUROLOGIC:  Alert and oriented x 3 PSYCHIATRIC:  Normal affect        ASSESSMENT:    1. Paroxysmal atrial fibrillation (HCC)   2. Atypical atrial flutter (HCC)   3. Typical atrial flutter (HCC)    PLAN:    In order of problems listed above:   ***Transition loop recorder monitoring  #Paroxysmal atrial fibrillation and flutter Doing well after catheter ablation June 17, 2020.  Loop recorder monitor tracings have shown no recurrence.  She takes Xarelto for stroke prophylaxis.  She has been weaning her metoprolol given history of dizziness.  #PVCs     Total time spent with patient today *** minutes. This includes reviewing records, evaluating the patient and coordinating care.   Medication Adjustments/Labs and Tests Ordered: Current medicines are reviewed at length with the patient today.  Concerns regarding medicines are outlined above.  No orders of the defined types were placed in this encounter.  No orders of the defined types were placed in this encounter.    Signed, Lars Mage, MD, [facility], [place] 12/08/2021 8:47 PM    Electrophysiology [facility]

## 2021-12-09 ENCOUNTER — Encounter: Payer: Self-pay | Admitting: Cardiology

## 2021-12-09 ENCOUNTER — Ambulatory Visit: Payer: Medicare Other | Attending: Cardiology | Admitting: Cardiology

## 2021-12-09 VITALS — BP 138/70 | HR 88 | Ht 66.0 in | Wt 111.8 lb

## 2021-12-09 DIAGNOSIS — I483 Typical atrial flutter: Secondary | ICD-10-CM | POA: Diagnosis not present

## 2021-12-09 DIAGNOSIS — I480 Paroxysmal atrial fibrillation: Secondary | ICD-10-CM | POA: Diagnosis not present

## 2021-12-09 DIAGNOSIS — I484 Atypical atrial flutter: Secondary | ICD-10-CM | POA: Insufficient documentation

## 2021-12-09 NOTE — Patient Instructions (Signed)
Medication Instructions:  Stop Metoprolol Succinate  *If you need a refill on your cardiac medications before your next appointment, please call your pharmacy*   Lab Work: None  If you have labs (blood work) drawn today and your tests are completely normal, you will receive your results only by: [place] (if you have MyChart) OR A paper copy in the mail If you have any lab test that is abnormal or we need to change your treatment, we will call you to review the results.   Testing/Procedures: None    Follow-Up: At [facility], you and your health needs are our priority.  As part of our continuing mission to provide you with exceptional heart care, we have created designated Provider Care Teams.  These Care Teams include your primary Cardiologist (physician) and Advanced Practice Providers (APPs -  Physician Assistants and Nurse Practitioners) who all work together to provide you with the care you need, when you need it.  We recommend signing up for the patient portal called "MyChart".  Sign up information is provided on this After Visit Summary.  MyChart is used to connect with patients for Virtual Visits (Telemedicine).  Patients are able to view lab/test results, encounter notes, upcoming appointments, etc.  Non-urgent messages can be sent to your provider as well.   To learn more about what you can do with MyChart, go to [email or web address].    Your next appointment:   6 month(s)  The format for your next appointment:   In Person  Provider:   You will see one of the following Advanced Practice Providers on your designated Care Team:   Tommye Standard, [place] Legrand Como "Jonni Sanger" Chalmers Cater, [place]      Other Instructions None   Important Information About Sugar

## 2021-12-09 NOTE — Progress Notes (Signed)
Electrophysiology Office Follow up Visit Note:    Date:  12/09/2021   ID:  [name], DOB 12/03/1945, MRN [id number]  PCP:  Lauree Chandler, NP  [facility] Cardiologist:  None  [facility] Electrophysiologist:  [name], MD    Interval History:    Alicia [name] is a 76 y.o. female who presents for a follow up visit.  She was previously followed by Dr. Rayann Heman and saw him last October 06, 2021.  The patient has a history of paroxysmal atrial fibrillation and PVCs.  She has had her post ablation atrial fibrillation burden monitored by loop recorder.  Her metoprolol was decreased at the last appointment.  She states that she has been doing much better overall from a cardiac perspective. She is taking Toprol XL 12.'5mg'$  daily since August 2023.   She reports a reduction in her frequency of PVCs. These occur randomly. Patient states that she does experience dizziness associated with her PVCs.  She has been weaning off of Gabapentin with improvement of her dizziness. She states that her neurologist plans to wean her off of Baclofen next if she tolerates this.  She goes on a walk daily using her rollator. She stays active around her house as well and does not need her walker much indoors.  She has some postnasal drip and congestion today.     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   Chronic migraine without aura, intractable, without status migrainosus    Diverticulosis 2010   Endometriosis    Low sodium levels    Malnutrition (HCC)    MD (muscular dystrophy) ([place])    Meniere's disease, bilateral    [facility]   Peripheral neuropathy    Dr Erling Cruz   Pneumonia    Sensorineural hearing loss, bilateral    Vertigo 2019    Past Surgical History:  Procedure Laterality Date   ABDOMINAL HYSTERECTOMY  1980 or 1981   for Endometriosis   APPENDECTOMY  1965   high school; low grade  appendiceal cancer   ATRIAL FIBRILLATION ABLATION N/A 06/17/2020   Procedure: ATRIAL FIBRILLATION ABLATION;  Surgeon: Thompson Grayer, MD;  Location: [facility];  Service: Cardiovascular;  Laterality: N/A;   BREAST ENHANCEMENT SURGERY Bilateral 1979   COLONOSCOPY  01/2012   Tics; Dr Teena Irani   ESI      X 3 in 2003 & 02/17/2011 for L4-S1 symptoms   implantable loop recorder placement  01/14/2021   Medtronic Reveal Linq model LNQ 22 ([place] [id number]) implantable loop recorder   LUMBAR LAMINECTOMY  12/2012   W-S , [place]   TONSILLECTOMY  1950 or 1951   TUBAL LIGATION     vocal cords stripped  1970    Current Medications: Current Meds  Medication Sig   acetaminophen (TYLENOL) 500 MG tablet Take 500 mg by mouth at bedtime as needed for moderate pain.   baclofen (LIORESAL) 10 MG tablet Take 10 mg by mouth at bedtime.   butalbital-acetaminophen-caffeine (FIORICET) 50-325-40 MG tablet Take 1 tablet by mouth every 6 (six) hours as needed for migraine.   Calcium Citrate-Vitamin D (CALCIUM + D PO) Take 1 tablet by mouth as directed. 3-4 times daily   Carboxymethylcellulose Sodium (THERATEARS) 0.25 % SOLN Place 1 drop into both eyes 4 (four) times daily.   Cholecalciferol (VITAMIN D3) LIQD Take 1,000 Units by mouth daily.  Evening Primrose Oil 1000 MG CAPS Take 1,000 mg by mouth every evening.   FEMRING 0.05 MG/24HR RING Place 1 each vaginally every 3 (three) months.   Lutein 20 MG TABS Take 20 mg by mouth daily.    MAGNESIUM CITRATE PO Take 300 mg by mouth daily.   meclizine (ANTIVERT) 12.5 MG tablet TAKE 1 TO 2 TABLETS(12.5 TO 25 MG) BY MOUTH THREE TIMES DAILY AS NEEDED FOR DIZZINESS   methocarbamol (ROBAXIN) 750 MG tablet Take 750 mg by mouth every 6 (six) hours.   metoprolol succinate (TOPROL XL) 25 MG 24 hr tablet Take 0.5 tablets (12.5 mg total) by mouth every evening. Pt should begin this dose in 4 Weeks; 11/03/2021;  Toprol XL 12.5 mg- Take half tablet by mouth every evening;    Multiple Vitamin (MULTIVITAMIN WITH MINERALS) TABS tablet Take 1 tablet by mouth daily.   Multiple Vitamins-Minerals (PRESERVISION AREDS 2) CAPS Take 1 capsule by mouth 2 (two) times daily.   Omega-3 1000 MG CAPS Take 1,000 mg by mouth 2 (two) times daily.   ondansetron (ZOFRAN-ODT) 4 MG disintegrating tablet DISSOLVE 1 TABLET(4 MG) ON THE TONGUE EVERY 6 HOURS AS NEEDED FOR NAUSEA OR VOMITING   rivaroxaban (XARELTO) 20 MG TABS tablet TAKE 1 TABLET DAILY WITH SUPPER   sodium chloride 1 g tablet Take 1 g by mouth 4 (four) times daily.   UBRELVY 100 MG TABS Take 1-2 tablets by mouth 2 (two) times daily as needed.     Allergies:   Biaxin [clarithromycin], Sulfonamide derivatives, Tequin [gatifloxacin], Demerol, Lactose intolerance (gi), Nsaids, Risedronate sodium, Rofecoxib, Alendronate sodium, Aspirin, Cardizem [diltiazem hcl], Eliquis [apixaban], Fish allergy, Benadryl [diphenhydramine hcl], and Other   Social History   Socioeconomic History   Marital status: Married    Spouse name: Not on file   Number of children: Not on file   Years of education: Not on file   Highest education level: Not on file  Occupational History   Not on file  Tobacco Use   Smoking status: Never   Smokeless tobacco: Never  Vaping Use   Vaping Use: Never used  Substance and Sexual Activity   Alcohol use: Not Currently   Drug use: Never   Sexual activity: Not Currently  Other Topics Concern   Not on file  Social History Narrative   Social History      Diet? Trying to gain weight      Do you drink/eat things with caffeine? 1 mug hot tea with breakfast per day      Marital status?  Married 5 years                                  What year were you married? 1970      Do you live in a house, apartment, assisted living, condo, trailer, etc.? House (46 years)      Is it one or more stories? 2 stories and walk up finished attic      How many persons live in your home? 2      Do you have any pets in your  home? (please list) none      Highest level of education completed? 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     yes  Type & how often? Walk in neighborhood 4 - 6 times per week if possible      Advanced Directives (Lawyer just beginning to work on these things)      Do you have a living will?      Do you have a DNR form?                    no              If not, do you want to discuss one?      Do you have signed POA/HPOA for forms?       Functional Status      Do you have difficulty bathing or dressing yourself? no      Do you have difficulty preparing food or eating? no      Do you have difficulty managing your medications? no      Do you have difficulty managing your finances? no      Do you have difficulty affording your medications? no   Social Determinants of Health   Financial Resource Strain: Not on file  Food Insecurity: Not on file  Transportation Needs: Not on file  Physical Activity: Not on file  Stress: Not on file  Social Connections: Not on file     Family History: The patient's family history includes Alcohol abuse in her mother; Alzheimer's disease (age of onset: 87) in her father; Arthritis (age of onset: 67) in her sister; Cancer (age of onset: 35) in her maternal aunt; Dementia in her mother; Diabetes (age of onset: 69) in her mother; Hyperlipidemia in her sister; Hyperlipidemia (age of onset: 55) in her sister; Hypertension in her mother; Kidney failure in her mother; Lung disease in her maternal grandfather and maternal grandmother; Osteoporosis in her mother; Other in her daughter and son; Stroke (age of onset: 34) in her mother. There is no history of Heart disease or Heart attack.  ROS:   Please see the history of present illness.    (+) postnasal drip (+) congestion All other systems reviewed and are  negative.  EKGs/Labs/Other Studies Reviewed:    The following studies were reviewed today:     Recent Labs: 07/15/2021: ALT 13 10/06/2021: BUN 16; Creatinine, Ser 0.56; Hemoglobin 14.3; Magnesium 2.4; Platelets 319; Potassium 5.1; Sodium 130; TSH 3.540  Recent Lipid Panel    Component Value Date/Time   CHOL 192 03/19/2018 1005   TRIG 52 03/19/2018 1005   TRIG 46 01/29/2006 1132   HDL 88 03/19/2018 1005   CHOLHDL 2.2 03/19/2018 1005   VLDL 9.4 08/20/2012 1032   LDLCALC 91 03/19/2018 1005   LDLDIRECT 124.7 08/20/2012 1032    Physical Exam:    VS:  BP 138/70   Pulse 88   Ht '5\' 6"'$  (1.676 m)   Wt 111 lb 12.8 oz (50.7 kg)   SpO2 96%   BMI 18.04 kg/m     Wt Readings from Last 3 Encounters:  12/09/21 111 lb 12.8 oz (50.7 kg)  10/28/21 112 lb 3.2 oz (50.9 kg)  10/06/21 106 lb (48.1 kg)     GEN:  Well nourished, well developed in no acute distress HEENT: Normal NECK: No JVD; No carotid bruits LYMPHATICS: No lymphadenopathy CARDIAC: RRR, no murmurs, rubs, gallops RESPIRATORY:  Clear to auscultation without rales, wheezing or rhonchi  ABDOMEN: Soft, non-tender, non-distended MUSCULOSKELETAL:  No edema; No deformity  SKIN: Warm and dry NEUROLOGIC:  Alert and oriented x 3 PSYCHIATRIC:  Normal  affect        ASSESSMENT:    1. Paroxysmal atrial fibrillation (HCC)   2. Atypical atrial flutter (HCC)   3. Typical atrial flutter (HCC)    PLAN:    In order of problems listed above:   #Paroxysmal atrial fibrillation and flutter Doing well after catheter ablation June 17, 2020.  Loop recorder monitor tracings have shown no recurrence.  She takes Xarelto for stroke prophylaxis.  She has been weaning her Metoprolol given history of dizziness. May discontinue Metoprolol at this time. Will continue Xarelto.  #PVCs Monitor burden closely as we stop metoprolol today.  Follow up in 6 months with APP.   Medication Adjustments/Labs and Tests Ordered: Current medicines are  reviewed at length with the patient today.  Concerns regarding medicines are outlined above.  No orders of the defined types were placed in this encounter.  No orders of the defined types were placed in this encounter.  This document serves as a record of services personally performed by Lars Mage, MD. It was created on her behalf by Eugene Gavia, a trained medical scribe. The creation of this record is based on the scribe's personal observations and the provider's statements to them. This document has been checked and approved by the attending provider.  I, [name], MD, have reviewed all documentation for this visit. The documentation on 12/09/21 for the exam, diagnosis, procedures, and orders are all accurate and complete.    Signed, Lars Mage, MD, [facility], [facility] 12/09/2021 10:04 AM    Electrophysiology [facility]

## 2021-12-20 ENCOUNTER — Encounter: Payer: Self-pay | Admitting: Cardiology

## 2021-12-23 ENCOUNTER — Ambulatory Visit (INDEPENDENT_AMBULATORY_CARE_PROVIDER_SITE_OTHER): Payer: Medicare Other

## 2021-12-23 ENCOUNTER — Encounter: Payer: Self-pay | Admitting: Internal Medicine

## 2021-12-23 ENCOUNTER — Ambulatory Visit (INDEPENDENT_AMBULATORY_CARE_PROVIDER_SITE_OTHER): Payer: Medicare Other | Admitting: Internal Medicine

## 2021-12-23 DIAGNOSIS — R058 Other specified cough: Secondary | ICD-10-CM | POA: Diagnosis not present

## 2021-12-23 DIAGNOSIS — R053 Chronic cough: Secondary | ICD-10-CM | POA: Diagnosis not present

## 2021-12-23 MED ORDER — PANTOPRAZOLE SODIUM 40 MG PO TBEC: 40.0000 mg | DELAYED_RELEASE_TABLET | Freq: Every day | ORAL | 2 refills | Status: DC

## 2021-12-23 MED ORDER — FAMOTIDINE 20 MG PO TABS: ORAL_TABLET | ORAL | 11 refills | Status: DC

## 2021-12-23 MED ORDER — METHYLPREDNISOLONE ACETATE 80 MG/ML IJ SUSP
120.0000 mg | Freq: Once | INTRAMUSCULAR | Status: AC
  Administered 2021-12-23: 120 mg via INTRAMUSCULAR

## 2021-12-23 NOTE — Patient Instructions (Addendum)
Pantoprazole (protonix) 40 mg   Take  30-60 min before first meal of the day and Pepcid (famotidine)  20 mg after supper until return to office - this is the best way to tell whether stomach acid is contributing to your problem.    GERD (REFLUX)  is an extremely common cause of respiratory symptoms just like yours , many times with no obvious heartburn at all.    It can be treated with medication, but also with lifestyle changes including elevation of the head of your bed (ideally with 6 -8inch blocks under the headboard of your bed),  Smoking cessation, avoidance of late meals, excessive alcohol, and avoid fatty foods, chocolate, peppermint, colas, red wine, and acidic juices such as orange juice.  NO MINT OR MENTHOL PRODUCTS SO NO COUGH DROPS  USE SUGARLESS CANDY INSTEAD (Jolley ranchers or Stover's or Life Savers) or even ice chips will also do - the key is to swallow to prevent all throat clearing. NO OIL BASED VITAMINS - use powdered substitutes.  No oil at all for 6 weeks except in cooking   For drainage / throat tickle try take CHLORPHENIRAMINE  4 mg  ("Allergy Relief" '4mg'$   at [facility] should be easiest to find in the blue box usually on bottom shelf)  take one every 4 hours as needed - extremely effective and inexpensive over the counter- may cause drowsiness so start with just a dose or two an hour before bedtime and see how you tolerate it before trying in daytime.   Depomedrol 120 mg IM today   Please remember to go to the  x-ray department  for your tests - we will call you with the results when they are available    Plan seeing Dr [name] in the next 4  weeks about your stuffy nose

## 2021-12-23 NOTE — Progress Notes (Unsigned)
Alicia [name], female    DOB: Oct 24, 1945   MRN: [id number]   Brief patient profile:  56  yowf never smoker with fall > spring rhinitis not responsive to allergy shots in the 1980 referred to pulmonary clinic 12/23/2021 by Alicia Mustache RN  for recurrent cough in setting of chronic refractory  rhinitis  since 04/2019  (p one covid shot  with with neg allergy workup by Alicia [name] who rec zyrtec s benefit and could not tol azalastin or afrin) and prior remote ENT eval "neg" per Pt Alicia [name])       History of Present Illness  12/23/2021  Pulmonary/ 1st office eval/Alicia [name]  Chief Complaint  Patient presents with   Pulmonary Consult    Self referral. Pt c/o cough off and on over the past year. Cough is occ prod with thick, white sputum. She has some pressure in her upper chest and her throat- worse when she talks. She has some nasal congestion and PND.   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 Cough: min mucoice productio/ present at least since Jan 2023 and worse ever since , better with sipping water, worse with voice use  Sleep:  on 1 inch blocks and 2 pillows: can't sleep due to stuffy nose SABA use: none   Just coming off gabapentin on day of ov "for 30 years made me dizzy"   No obvious other day to day or daytime pattern/variability or assoc  purulent sputum or mucus plugs or hemoptysis or cp , subjective wheeze or overt  hb symptoms.     Also denies any obvious fluctuation of symptoms with weather or environmental changes or other aggravating or alleviating factors except as outlined above   No unusual exposure hx or h/o childhood pna/ asthma or knowledge of premature birth.  Current Allergies, Complete Past Medical History, Past Surgical History, Family History, and Social History were reviewed in [employer] record.  ROS  The following are not active complaints unless bolded Hoarseness, sore throat,  dysphagia, dental problems, itching, sneezing,  nasal congestion or discharge of excess mucus or purulent secretions, ear ache,   fever, chills, sweats, unintended wt loss or wt gain, classically pleuritic or exertional cp,  orthopnea pnd or arm/hand swelling  or leg swelling, presyncope, palpitations, abdominal pain, anorexia, nausea, vomiting, diarrhea  or change in bowel habits or change in bladder habits, change in stools or change in urine, dysuria, hematuria,  rash, arthralgias, visual complaints, headache, numbness, weakness or ataxia or problems with walking or coordination,  change in mood or  memory.            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   Chronic migraine without aura, intractable, without status migrainosus    Diverticulosis 2010   Endometriosis    Low sodium levels    Malnutrition ([place])    MD (muscular dystrophy) ([place])    Meniere's disease, bilateral    [facility]   Peripheral neuropathy    Dr Alicia [name]   Pneumonia    Sensorineural hearing loss, bilateral    Vertigo 2019    Outpatient Medications Prior to Visit - - NOTE:   Unable to verify as accurately reflecting what pt takes    Medication Sig Dispense Refill   acetaminophen (TYLENOL) 500 MG tablet Take 500 mg by mouth at bedtime  as needed for moderate pain.     baclofen (LIORESAL) 10 MG tablet Take 10 mg by mouth at bedtime.     butalbital-acetaminophen-caffeine (FIORICET) 50-325-40 MG tablet Take 1 tablet by mouth every 6 (six) hours as needed for migraine. 30 tablet 5   Calcium Citrate-Vitamin D (CALCIUM + D PO) Take 1 tablet by mouth as directed. 3-4 times daily     Carboxymethylcellulose Sodium (THERATEARS) 0.25 % SOLN Place 1 drop into both eyes 4 (four) times daily.     Cholecalciferol (VITAMIN D3) LIQD Take 1,000 Units by mouth daily.     Evening Primrose Oil 1000 MG CAPS Take 1,000 mg by mouth every evening.     FEMRING 0.05 MG/24HR  RING Place 1 each vaginally every 3 (three) months.     Lutein 20 MG TABS Take 20 mg by mouth daily.      MAGNESIUM CITRATE PO Take 300 mg by mouth daily.     meclizine (ANTIVERT) 12.5 MG tablet TAKE 1 TO 2 TABLETS(12.5 TO 25 MG) BY MOUTH THREE TIMES DAILY AS NEEDED FOR DIZZINESS 60 tablet 0   methocarbamol (ROBAXIN) 750 MG tablet Take 750 mg by mouth every 6 (six) hours.     metoprolol succinate (TOPROL-XL) 25 MG 24 hr tablet Take 12.5 mg by mouth daily.     Multiple Vitamin (MULTIVITAMIN WITH MINERALS) TABS tablet Take 1 tablet by mouth daily.     Multiple Vitamins-Minerals (PRESERVISION AREDS 2) CAPS Take 1 capsule by mouth 2 (two) times daily.     Omega-3 1000 MG CAPS Take 1,000 mg by mouth 2 (two) times daily.     ondansetron (ZOFRAN-ODT) 4 MG disintegrating tablet DISSOLVE 1 TABLET(4 MG) ON THE TONGUE EVERY 6 HOURS AS NEEDED FOR NAUSEA OR VOMITING 60 tablet 1   rivaroxaban (XARELTO) 20 MG TABS tablet TAKE 1 TABLET DAILY WITH SUPPER 90 tablet 1   sodium chloride 1 g tablet Take 1 g by mouth 4 (four) times daily.     UBRELVY 100 MG TABS Take 1-2 tablets by mouth 2 (two) times daily as needed.     No facility-administered medications prior to visit.     Objective:     BP 134/72 (BP Location: Left Arm, Cuff Size: Normal)   Pulse 89   Ht '5\' 6"'$  (1.676 m)   Wt 113 lb 3.2 oz (51.3 kg)   SpO2 99% Comment: on RA  BMI 18.27 kg/m   SpO2: 99 % (on RA) Amb wf freq throat clearing and "hockng" during interview and exam   HEENT : Oropharynx  no excess pnd/min cobblestoning      Nasal turbinates severe dry Turbinate swelling to 90% occlusion of nasal passages    NECK :  without  apparent JVD/ palpable Nodes/TM    LUNGS: no acc muscle use,  Nl contour chest which is clear to A and P bilaterally without cough on insp or exp maneuvers   CV:  RRR  no s3 or murmur or increase in P2, and no edema   ABD:  soft and nontender with nl inspiratory excursion in the supine position. No bruits or  organomegaly appreciated   MS:  Nl gait/ ext warm without deformities Or obvious joint restrictions  calf tenderness, cyanosis or clubbing    SKIN: warm and dry without lesions    NEURO:  alert, approp, nl sensorium with  no motor or cerebellar deficits apparent.     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 Patent ostiomeatalcomplex bilaterally.   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   These are the same pts (now being labeled as having "irritable larynx syndrome" by some cough centers) who not infrequently have a history of having failed to tolerate ace inhibitors,  dry powder inhalers or biphosphonates or report having atypical/extraesophageal reflux symptoms that don't respond to standard doses of PPI  and are easily confused as having aecopd or asthma flares by even experienced allergists/ pulmonologists (myself included).   Of the three most common causes of  Sub-acute / recurrent or chronic cough, only one (GERD)  can  actually contribute to/ trigger  the other two (asthma and post nasal drip syndrome)  and perpetuate the cylce of cough.  While not intuitively obvious, many patients with chronic low grade reflux do not cough until there is a primary insult that disturbs the protective epithelial barrier and exposes sensitive nerve endings.   This is typically viral but can due to PNDS and  either may apply here.   >>>  The point is that once this occurs, it is difficult to eliminate the cycle  using anything but a maximally effective acid suppression regimen at least in the short run, accompanied by an appropriate diet (no oils!)  to address non acid GERD and control / eliminate  pnds with 1st gen H1 blockers per guidelines  >>> also so added depomedrol 120 mg  in case of component of Th-2 driven upper or lower airways inflammation (if cough responds short term only to relapse before return while will on full rx for uacs (as above), then  that would point to allergic rhinitis/ asthma or eos bronchitis as alternative dx)  And try to resist urge to clear throat by use of hard rock candy - no mint, menthol or chocolate though.  >>> next step ENT eval for severe rhinitis with almost complete obst of both nasal passages on exam and preventing her from sleeping chronically.   F/u here prn after ent eval complete  Discussed in detail all the  indications, usual  risks and alternatives  relative to the benefits with patient who agrees to proceed with Rx as outlined.             Each maintenance medication was reviewed in detail including emphasizing most importantly the difference between maintenance and prns and under what circumstances the prns are to be triggered using an action plan format where appropriate.  Total time for H and P, chart review, counseling,  reviewing nasal  device(s) and generating customized AVS unique to this office visit / same day charting >  45 min pt new to me          Christinia Gully,  MD 12/24/2021

## 2021-12-24 ENCOUNTER — Encounter: Payer: Self-pay | Admitting: Internal Medicine

## 2021-12-24 NOTE — Assessment & Plan Note (Addendum)
Onset was after 1st and last  covid vax  04/2019 - 04/29/19  sinus ct  Normally aerated paranasal sinuses. Patent ostiomeatalcomplex bilaterally.   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   These are the same pts (now being labeled as having "irritable larynx syndrome" by some cough centers) who not infrequently have a history of having failed to tolerate ace inhibitors,  dry powder inhalers or biphosphonates or report having atypical/extraesophageal reflux symptoms that don't respond to standard doses of PPI  and are easily confused as having aecopd or asthma flares by even experienced allergists/ pulmonologists (myself included).   Of the three most common causes of  Sub-acute / recurrent or chronic cough, only one (GERD)  can actually contribute to/ trigger  the other two (asthma and post nasal drip syndrome)  and perpetuate the cylce of cough.  While not intuitively obvious, many patients with chronic low grade reflux do not cough until there is a primary insult that disturbs the protective epithelial barrier and exposes sensitive nerve endings.   This is typically viral but can due to PNDS and  either may apply here.   >>>  The point is that once this occurs, it is difficult to eliminate the cycle  using anything but a maximally effective acid suppression regimen at least in the short run, accompanied by an appropriate diet (no oils!)  to address non acid GERD  and control / eliminate  pnds with 1st gen H1 blockers per guidelines  >>> also so added depomedrol 120 mg  in case of component of Th-2 driven upper or lower airways inflammation (if cough responds short term only to relapse before return while will on full rx for uacs (as above), then  that would point to allergic rhinitis/ asthma or eos bronchitis as alternative dx)  And try to resist urge to clear throat by use of hard rock candy - no mint, menthol or chocolate though.  >>> next step ENT eval for severe rhinitis with almost complete obst of both nasal passages on exam and preventing her from sleeping chronically.   F/u here prn after ent eval complete  Discussed in detail all the  indications, usual  risks and alternatives  relative to the benefits with patient who agrees to proceed with Rx as outlined.             Each maintenance medication was reviewed in detail including emphasizing most importantly the difference between maintenance and prns and under what circumstances the prns are to be triggered using an action plan format where appropriate.  Total time for H and P, chart review, counseling, reviewing nasal  device(s) and generating customized AVS unique to this office visit / same day charting >  45 min pt new to me

## 2021-12-26 ENCOUNTER — Ambulatory Visit (INDEPENDENT_AMBULATORY_CARE_PROVIDER_SITE_OTHER): Payer: Medicare Other

## 2021-12-26 DIAGNOSIS — I480 Paroxysmal atrial fibrillation: Secondary | ICD-10-CM

## 2021-12-27 ENCOUNTER — Other Ambulatory Visit: Payer: Self-pay

## 2021-12-27 DIAGNOSIS — R058 Other specified cough: Secondary | ICD-10-CM

## 2021-12-27 LAB — CUP PACEART REMOTE DEVICE CHECK
Date Time Interrogation Session: 20231022232303
Implantable Pulse Generator Implant Date: 20221111

## 2021-12-27 NOTE — Progress Notes (Signed)
hrct

## 2022-01-11 ENCOUNTER — Telehealth: Payer: Self-pay | Admitting: Internal Medicine

## 2022-01-11 NOTE — Telephone Encounter (Signed)
Called and spoke to patient and she states she got a message on her mychart about her CT scan. She states that the High Resolution CT scan states it was canceled but she still has her CT of her sinuses scheduled.   They both were for 11/13  Can we find out why the CT of her chest was canceled  Thank you

## 2022-01-11 NOTE — Telephone Encounter (Signed)
Raven looks like you called the patient

## 2022-01-16 ENCOUNTER — Ambulatory Visit (HOSPITAL_COMMUNITY): Payer: Medicare Other

## 2022-01-16 ENCOUNTER — Ambulatory Visit (HOSPITAL_COMMUNITY)
Admission: RE | Admit: 2022-01-16 | Discharge: 2022-01-16 | Disposition: A | Discharge status: Home / Self Care | Payer: Medicare Other | Source: Ambulatory Visit | Attending: Internal Medicine | Admitting: Internal Medicine

## 2022-01-16 DIAGNOSIS — J8410 Pulmonary fibrosis, unspecified: Secondary | ICD-10-CM | POA: Diagnosis not present

## 2022-01-16 DIAGNOSIS — H00025 Hordeolum internum left lower eyelid: Secondary | ICD-10-CM | POA: Diagnosis not present

## 2022-01-16 DIAGNOSIS — R058 Other specified cough: Secondary | ICD-10-CM | POA: Insufficient documentation

## 2022-01-16 DIAGNOSIS — H524 Presbyopia: Secondary | ICD-10-CM | POA: Diagnosis not present

## 2022-01-16 DIAGNOSIS — H52223 Regular astigmatism, bilateral: Secondary | ICD-10-CM | POA: Diagnosis not present

## 2022-01-16 DIAGNOSIS — J3489 Other specified disorders of nose and nasal sinuses: Secondary | ICD-10-CM | POA: Diagnosis not present

## 2022-01-16 DIAGNOSIS — R590 Localized enlarged lymph nodes: Secondary | ICD-10-CM | POA: Diagnosis not present

## 2022-01-16 DIAGNOSIS — H5203 Hypermetropia, bilateral: Secondary | ICD-10-CM | POA: Diagnosis not present

## 2022-01-16 DIAGNOSIS — J479 Bronchiectasis, uncomplicated: Secondary | ICD-10-CM | POA: Diagnosis not present

## 2022-01-16 DIAGNOSIS — I700 Atherosclerosis of aorta: Secondary | ICD-10-CM | POA: Diagnosis not present

## 2022-01-17 NOTE — Progress Notes (Signed)
Carelink Summary Report / Loop Recorder 

## 2022-01-30 ENCOUNTER — Ambulatory Visit (INDEPENDENT_AMBULATORY_CARE_PROVIDER_SITE_OTHER): Payer: Medicare Other

## 2022-01-30 DIAGNOSIS — I480 Paroxysmal atrial fibrillation: Secondary | ICD-10-CM | POA: Diagnosis not present

## 2022-01-30 LAB — CUP PACEART REMOTE DEVICE CHECK
Date Time Interrogation Session: 20231126231017
Implantable Pulse Generator Implant Date: 20221111

## 2022-02-22 DIAGNOSIS — R053 Chronic cough: Secondary | ICD-10-CM | POA: Diagnosis not present

## 2022-03-07 ENCOUNTER — Ambulatory Visit (INDEPENDENT_AMBULATORY_CARE_PROVIDER_SITE_OTHER): Payer: Medicare Other

## 2022-03-07 DIAGNOSIS — I480 Paroxysmal atrial fibrillation: Secondary | ICD-10-CM | POA: Diagnosis not present

## 2022-03-07 DIAGNOSIS — Z23 Encounter for immunization: Secondary | ICD-10-CM

## 2022-03-07 LAB — CUP PACEART REMOTE DEVICE CHECK
Date Time Interrogation Session: 20240101230614
Implantable Pulse Generator Implant Date: 20221111

## 2022-03-13 NOTE — Progress Notes (Signed)
Carelink Summary Report / Loop Recorder 

## 2022-03-17 ENCOUNTER — Ambulatory Visit: Payer: Medicare Other | Admitting: Nurse Practitioner

## 2022-03-21 DIAGNOSIS — M545 Low back pain, unspecified: Secondary | ICD-10-CM | POA: Diagnosis not present

## 2022-03-21 DIAGNOSIS — M25511 Pain in right shoulder: Secondary | ICD-10-CM | POA: Diagnosis not present

## 2022-03-21 DIAGNOSIS — M5431 Sciatica, right side: Secondary | ICD-10-CM | POA: Diagnosis not present

## 2022-03-21 DIAGNOSIS — M542 Cervicalgia: Secondary | ICD-10-CM | POA: Diagnosis not present

## 2022-03-24 ENCOUNTER — Ambulatory Visit: Payer: Medicare Other | Admitting: Nurse Practitioner

## 2022-03-29 DIAGNOSIS — M25511 Pain in right shoulder: Secondary | ICD-10-CM | POA: Diagnosis not present

## 2022-03-29 DIAGNOSIS — M5431 Sciatica, right side: Secondary | ICD-10-CM | POA: Diagnosis not present

## 2022-03-29 DIAGNOSIS — M545 Low back pain, unspecified: Secondary | ICD-10-CM | POA: Diagnosis not present

## 2022-03-29 DIAGNOSIS — M542 Cervicalgia: Secondary | ICD-10-CM | POA: Diagnosis not present

## 2022-03-30 ENCOUNTER — Other Ambulatory Visit (HOSPITAL_COMMUNITY): Payer: Self-pay | Admitting: Physician Assistant

## 2022-03-30 DIAGNOSIS — I480 Paroxysmal atrial fibrillation: Secondary | ICD-10-CM

## 2022-03-30 NOTE — Telephone Encounter (Signed)
Prescription refill request for Xarelto received.  Indication: Afib   Last office visit: 12/09/21 Quentin Ore)  Weight: 51.3kg Age: 77 Scr:  0.56 (10/06/21)  CrCl: 68.71m/min  Appropriate dose and refill sent to requested pharmacy.

## 2022-03-31 ENCOUNTER — Ambulatory Visit (INDEPENDENT_AMBULATORY_CARE_PROVIDER_SITE_OTHER): Payer: Medicare Other | Admitting: Adult Health

## 2022-03-31 ENCOUNTER — Encounter: Payer: Self-pay | Admitting: Adult Health

## 2022-03-31 ENCOUNTER — Emergency Department (HOSPITAL_COMMUNITY)
Admission: EM | Admit: 2022-03-31 | Discharge: 2022-04-01 | Disposition: A | Discharge status: Home / Self Care | Payer: Medicare Other | Attending: Emergency Medicine | Admitting: Emergency Medicine

## 2022-03-31 VITALS — BP 132/80 | HR 90 | Temp 96.9°F | Ht 66.0 in | Wt 113.8 lb

## 2022-03-31 DIAGNOSIS — E871 Hypo-osmolality and hyponatremia: Secondary | ICD-10-CM

## 2022-03-31 DIAGNOSIS — I480 Paroxysmal atrial fibrillation: Secondary | ICD-10-CM | POA: Diagnosis not present

## 2022-03-31 DIAGNOSIS — R252 Cramp and spasm: Secondary | ICD-10-CM | POA: Diagnosis not present

## 2022-03-31 DIAGNOSIS — Z7901 Long term (current) use of anticoagulants: Secondary | ICD-10-CM | POA: Insufficient documentation

## 2022-03-31 DIAGNOSIS — R0602 Shortness of breath: Secondary | ICD-10-CM | POA: Diagnosis not present

## 2022-03-31 DIAGNOSIS — I10 Essential (primary) hypertension: Secondary | ICD-10-CM

## 2022-03-31 DIAGNOSIS — Z79899 Other long term (current) drug therapy: Secondary | ICD-10-CM | POA: Diagnosis not present

## 2022-03-31 DIAGNOSIS — Z85828 Personal history of other malignant neoplasm of skin: Secondary | ICD-10-CM | POA: Insufficient documentation

## 2022-03-31 NOTE — ED Provider Triage Note (Signed)
Emergency Medicine Provider Triage Evaluation Note  Alicia [name] , a 77 y.o. female  was evaluated in triage.  Pt complains of ongoing hypertension this past week.  She has also had a headache and took her Roselyn Meier and drank tea with improvement.  She normally has low blood pressure and is very concerned because it is now high.  Went to fire station and her BP was 200/100.  Endorses some shortness of breath and palpitations, feeling PVCs.  Denies chest pain, syncope, weakness.   Review of Systems  Positive: As above Negative: As above  Physical Exam  BP (!) 197/88 (BP Location: Left Arm)   Pulse 88   Temp 97.8 F (36.6 C) (Oral)   Resp 18   SpO2 98%  Gen:   Awake, no distress   Resp:  Normal effort  MSK:   Moves extremities without difficulty  Other:    Medical Decision Making  Medically screening exam initiated at 11:37 PM.  Appropriate orders placed.  Alicia [name] was informed that the remainder of the evaluation will be completed by another provider, this initial triage assessment does not replace that evaluation, and the importance of remaining in the ED until their evaluation is complete.     Theressa Stamps R, [place] 03/31/22 [phone]

## 2022-03-31 NOTE — Progress Notes (Signed)
[facility]  Provider:  Durenda Age DNP  Code Status:  Full Code  Goals of Care:     07/15/2021   11:40 AM  Advanced Directives  Does Patient Have a Medical Advance Directive? No  Would patient like information on creating a medical advance directive? No - Patient declined     Chief Complaint  Patient presents with   Acute Visit    Patient presents today for dizziness, bp concerns, headaches since Wednesday,03/29/22. She reports numbness in her legs and arms.    HPI: Patient is a 77 y.o. female seen today for an acute visit for elevated Bps. She came today with BP log, SBPs ranging from 134 to 144. She was tapered off Metoprolol succinate due to dizziness. She said that her SBPs ranging from 110-130 and DBP 60-70. She said that yesterday, she woke up with her "heart raising" She has a heart monitor on her left chest and follows up with cardiology. She has headache and took UBRELVY yesterday but did not help. She was afraid to take Fioricet due to her having PAF. She said that she had leg cramps for 3 consecutive days. She takes Magnesium citrate for leg cramps.  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; low grade appendiceal cancer   ATRIAL FIBRILLATION ABLATION N/A 06/17/2020   Procedure: ATRIAL FIBRILLATION ABLATION;  Surgeon: Thompson Grayer, MD;  Location: [facility];  Service: Cardiovascular;   Laterality: N/A;   BREAST ENHANCEMENT SURGERY Bilateral 1979   COLONOSCOPY  01/2012   Tics; Dr Teena Irani   ESI      X 3 in 2003 & 02/17/2011 for L4-S1 symptoms   implantable loop recorder placement  01/14/2021   Medtronic Reveal Linq model LNQ 22 ([place] [id number]) implantable loop recorder   LUMBAR LAMINECTOMY  12/2012   W-S , [facility]   TONSILLECTOMY  1950 or 1951   TUBAL LIGATION     vocal cords stripped  1970    Allergies  Allergen Reactions   Biaxin [Clarithromycin] Anaphylaxis   Sulfonamide Derivatives Itching and Rash   Tequin [Gatifloxacin] Anaphylaxis   Demerol Nausea And Vomiting   Lactose Intolerance (Gi) Other (See Comments)    Mild abdominal pain, gas, bloating   Nsaids Other (See Comments)    GI upset/stomach pain   Risedronate Sodium Nausea And Vomiting and Other (See Comments)     GI upset/heartburn    Rofecoxib Nausea And Vomiting and Other (See Comments)    GI upset/heartburn    Alendronate Sodium Nausea And Vomiting   Aspirin Other (See Comments)    Abdominal pain    Cardizem [Diltiazem Hcl] Hives   Eliquis [Apixaban] Hives   Fish Allergy Nausea And Vomiting    Can take fish oil   Benadryl [Diphenhydramine Hcl] Palpitations  Other Itching and Rash    EKG LEADS caused burning sores     Outpatient Encounter Medications as of 03/31/2022  Medication Sig   acetaminophen (TYLENOL) 500 MG tablet Take 500 mg by mouth at bedtime as needed for moderate pain.   baclofen (LIORESAL) 10 MG tablet Take 10 mg by mouth at bedtime.   butalbital-acetaminophen-caffeine (FIORICET) 50-325-40 MG tablet Take 1 tablet by mouth every 6 (six) hours as needed for migraine.   Calcium Citrate-Vitamin D (CALCIUM + D PO) Take 1 tablet by mouth as directed. 3-4 times daily   Carboxymethylcellulose Sodium (THERATEARS) 0.25 % SOLN Place 1 drop into both eyes 4 (four) times daily.   Cholecalciferol (VITAMIN D3) LIQD Take 1,000 Units by mouth daily.   Evening Primrose Oil 1000 MG CAPS Take  1,000 mg by mouth every evening.   famotidine (PEPCID) 20 MG tablet One after supper   FEMRING 0.05 MG/24HR RING Place 1 each vaginally every 3 (three) months.   Lutein 20 MG TABS Take 20 mg by mouth daily.    MAGNESIUM CITRATE PO Take 300 mg by mouth daily.   meclizine (ANTIVERT) 12.5 MG tablet TAKE 1 TO 2 TABLETS(12.5 TO 25 MG) BY MOUTH THREE TIMES DAILY AS NEEDED FOR DIZZINESS   methocarbamol (ROBAXIN) 750 MG tablet Take 750 mg by mouth every 6 (six) hours.   Multiple Vitamin (MULTIVITAMIN WITH MINERALS) TABS tablet Take 1 tablet by mouth daily.   Multiple Vitamins-Minerals (PRESERVISION AREDS 2) CAPS Take 1 capsule by mouth 2 (two) times daily.   Omega-3 1000 MG CAPS Take 1,000 mg by mouth 2 (two) times daily.   ondansetron (ZOFRAN-ODT) 4 MG disintegrating tablet DISSOLVE 1 TABLET(4 MG) ON THE TONGUE EVERY 6 HOURS AS NEEDED FOR NAUSEA OR VOMITING   pantoprazole (PROTONIX) 40 MG tablet Take 1 tablet (40 mg total) by mouth daily. Take 30-60 min before first meal of the day   sodium chloride 1 g tablet Take 1 g by mouth 4 (four) times daily.   UBRELVY 100 MG TABS Take 1-2 tablets by mouth 2 (two) times daily as needed.   XARELTO 20 MG TABS tablet TAKE 1 TABLET DAILY WITH SUPPER   metoprolol succinate (TOPROL-XL) 25 MG 24 hr tablet Take 12.5 mg by mouth daily.   No facility-administered encounter medications on file as of 03/31/2022.    Review of Systems:  Review of Systems  Constitutional:  Negative for appetite change, chills, fatigue and fever.  HENT:  Negative for congestion, hearing loss, rhinorrhea and sore throat.   Eyes: Negative.   Respiratory:  Negative for cough, shortness of breath and wheezing.   Cardiovascular:  Positive for palpitations. Negative for chest pain and leg swelling.  Gastrointestinal:  Negative for abdominal pain, constipation, diarrhea, nausea and vomiting.  Genitourinary:  Negative for dysuria.  Musculoskeletal:  Negative for arthralgias, back pain and  myalgias.  Skin:  Negative for color change, rash and wound.  Neurological:  Positive for headaches. Negative for dizziness and weakness.  Psychiatric/Behavioral:  Negative for behavioral problems. The patient is not nervous/anxious.     Health Maintenance  Topic Date Due   Zoster Vaccines- Shingrix (1 of 2) Never done   COVID-19 Vaccine (2 - 2023-24 season) 11/04/2021   Medicare Annual Wellness (AWV)  03/31/2022   DTaP/Tdap/Td (3 - Td or Tdap) 01/04/2027   Pneumonia Vaccine 67+ Years old  Completed   INFLUENZA VACCINE  Completed   DEXA SCAN  Completed   Hepatitis C Screening  Completed  HPV VACCINES  Aged Out   COLONOSCOPY (Pts 45-62yr Insurance coverage will need to be confirmed)  Discontinued    Physical Exam: Vitals:   03/31/22 1412  BP: 132/80  Pulse: 90  Temp: (!) 96.9 F (36.1 C)  SpO2: 96%  Weight: 113 lb 12.8 oz (51.6 kg)  Height: '5\' 6"'$  (1.676 m)   Body mass index is 18.37 kg/m. Physical Exam Constitutional:      General: She is not in acute distress. HENT:     Head: Normocephalic and atraumatic.     Nose: Nose normal.     Mouth/Throat:     Mouth: Mucous membranes are moist.  Eyes:     Conjunctiva/sclera: Conjunctivae normal.  Cardiovascular:     Rate and Rhythm: Normal rate and regular rhythm.     Comments: Left chest heart monitor Pulmonary:     Effort: Pulmonary effort is normal.     Breath sounds: Normal breath sounds.  Abdominal:     General: Bowel sounds are normal.     Palpations: Abdomen is soft.  Musculoskeletal:        General: Normal range of motion.     Cervical back: Normal range of motion.  Skin:    General: Skin is warm and dry.  Neurological:     General: No focal deficit present.     Mental Status: She is alert and oriented to person, place, and time.  Psychiatric:        Mood and Affect: Mood normal.        Behavior: Behavior normal.        Thought Content: Thought content normal.        Judgment: Judgment normal.      Labs reviewed: Basic Metabolic Panel: Recent Labs    07/15/21 1519 08/17/21 1339 10/06/21 1149  NA 129* 132* 130*  K 4.6 4.9 5.1  CL 93* 95* 92*  CO2 '30 31 25  '$ GLUCOSE 95 79 132*  BUN '17 16 16  '$ CREATININE 0.49* 0.47* 0.56*  CALCIUM 9.6 9.5 9.8  MG  --   --  2.4*  TSH  --   --  3.540   Liver Function Tests: Recent Labs    07/15/21 1519  AST 19  ALT 13  BILITOT 0.4  PROT 6.8   No results for input(s): "LIPASE", "AMYLASE" in the last 8760 hours. No results for input(s): "AMMONIA" in the last 8760 hours. CBC: Recent Labs    07/15/21 1519 10/06/21 1149  WBC 4.8 7.3  NEUTROABS 3,562 5.9  HGB 13.7 14.3  HCT 41.4 42.3  MCV 92.8 91  PLT 274 319   Lipid Panel: No results for input(s): "CHOL", "HDL", "LDLCALC", "TRIG", "CHOLHDL", "LDLDIRECT" in the last 8760 hours. Lab Results  Component Value Date   HGBA1C 5.6 03/03/2011    Procedures since last visit: [place] Result Date: 03/07/2022 ILR summary report received. Battery status OK. Normal device function. No new symptom, tachy, brady, or pause episodes. No new AF episodes. Monthly summary reports and ROV/PRN.  PVC's=2% LA   Assessment/Plan  1. Primary hypertension -  BPs mildly elevated, doesn't want to start on medications due to dizziness -  Metoprolol was tapered off due to dizziness -  continue to monitor BP, log and bring to next appointment  2. Chronic hyponatremia - Basic Metabolic Panel with eGFR -  takes NaCl 1 gm QID but did not take it today and only took 2 yesterday due to elevated BP  3. Leg cramps -  had leg cramps X 3 days -  continue Magnesium citrate -  check magnesium level  4. PAF -  rate-controlled -  has a heart monitor on left chest -  continue Xarelto for anticoagulation    Labs/tests ordered:  BMP and Magnesium  Next appt:  04/03/2022

## 2022-03-31 NOTE — Patient Instructions (Signed)
Hypertension, Adult High blood pressure (hypertension) is when the force of blood pumping through the arteries is too strong. The arteries are the blood vessels that carry blood from the heart throughout the body. Hypertension forces the heart to work harder to pump blood and may cause arteries to become narrow or stiff. Untreated or uncontrolled hypertension can lead to a heart attack, heart failure, a stroke, kidney disease, and other problems. A blood pressure reading consists of a higher number over a lower number. Ideally, your blood pressure should be below 120/80. The first ("top") number is called the systolic pressure. It is a measure of the pressure in your arteries as your heart beats. The second ("bottom") number is called the diastolic pressure. It is a measure of the pressure in your arteries as the heart relaxes. What are the causes? The exact cause of this condition is not known. There are some conditions that result in high blood pressure. What increases the risk? Certain factors may make you more likely to develop high blood pressure. Some of these risk factors are under your control, including: Smoking. Not getting enough exercise or physical activity. Being overweight. Having too much fat, sugar, calories, or salt (sodium) in your diet. Drinking too much alcohol. Other risk factors include: Having a personal history of heart disease, diabetes, high cholesterol, or kidney disease. Stress. Having a family history of high blood pressure and high cholesterol. Having obstructive sleep apnea. Age. The risk increases with age. What are the signs or symptoms? High blood pressure may not cause symptoms. Very high blood pressure (hypertensive crisis) may cause: Headache. Fast or irregular heartbeats (palpitations). Shortness of breath. Nosebleed. Nausea and vomiting. Vision changes. Severe chest pain, dizziness, and seizures. How is this diagnosed? This condition is diagnosed by  measuring your blood pressure while you are seated, with your arm resting on a flat surface, your legs uncrossed, and your feet flat on the floor. The cuff of the blood pressure monitor will be placed directly against the skin of your upper arm at the level of your heart. Blood pressure should be measured at least twice using the same arm. Certain conditions can cause a difference in blood pressure between your right and left arms. If you have a high blood pressure reading during one visit or you have normal blood pressure with other risk factors, you may be asked to: Return on a different day to have your blood pressure checked again. Monitor your blood pressure at home for 1 week or longer. If you are diagnosed with hypertension, you may have other blood or imaging tests to help your health care provider understand your overall risk for other conditions. How is this treated? This condition is treated by making healthy lifestyle changes, such as eating healthy foods, exercising more, and reducing your alcohol intake. You may be referred for counseling on a healthy diet and physical activity. Your health care provider may prescribe medicine if lifestyle changes are not enough to get your blood pressure under control and if: Your systolic blood pressure is above 130. Your diastolic blood pressure is above 80. Your personal target blood pressure may vary depending on your medical conditions, your age, and other factors. Follow these instructions at home: Eating and drinking  Eat a diet that is high in fiber and potassium, and low in sodium, added sugar, and fat. An example of this eating plan is called the DASH diet. DASH stands for Dietary Approaches to Stop Hypertension. To eat this way: Eat   plenty of fresh fruits and vegetables. Try to fill one half of your plate at each meal with fruits and vegetables. Eat whole grains, such as whole-wheat pasta, brown rice, or whole-grain bread. Fill about one  fourth of your plate with whole grains. Eat or drink low-fat dairy products, such as skim milk or low-fat yogurt. Avoid fatty cuts of meat, processed or cured meats, and poultry with skin. Fill about one fourth of your plate with lean proteins, such as fish, chicken without skin, beans, eggs, or tofu. Avoid pre-made and processed foods. These tend to be higher in sodium, added sugar, and fat. Reduce your daily sodium intake. Many people with hypertension should eat less than 1,500 mg of sodium a day. Do not drink alcohol if: Your health care provider tells you not to drink. You are pregnant, may be pregnant, or are planning to become pregnant. If you drink alcohol: Limit how much you have to: 0-1 drink a day for women. 0-2 drinks a day for men. Know how much alcohol is in your drink. In the [place] one drink equals one 12 oz bottle of beer (355 mL), one 5 oz glass of wine (148 mL), or one 1 oz glass of hard liquor (44 mL). Lifestyle  Work with your health care provider to maintain a healthy body weight or to lose weight. Ask what an ideal weight is for you. Get at least 30 minutes of exercise that causes your heart to beat faster (aerobic exercise) most days of the week. Activities may include walking, swimming, or biking. Include exercise to strengthen your muscles (resistance exercise), such as Pilates or lifting weights, as part of your weekly exercise routine. Try to do these types of exercises for 30 minutes at least 3 days a week. Do not use any products that contain nicotine or tobacco. These products include cigarettes, chewing tobacco, and vaping devices, such as e-cigarettes. If you need help quitting, ask your health care provider. Monitor your blood pressure at home as told by your health care provider. Keep all follow-up visits. This is important. Medicines Take over-the-counter and prescription medicines only as told by your health care provider. Follow directions carefully. Blood  pressure medicines must be taken as prescribed. Do not skip doses of blood pressure medicine. Doing this puts you at risk for problems and can make the medicine less effective. Ask your health care provider about side effects or reactions to medicines that you should watch for. Contact a health care provider if you: Think you are having a reaction to a medicine you are taking. Have headaches that keep coming back (recurring). Feel dizzy. Have swelling in your ankles. Have trouble with your vision. Get help right away if you: Develop a severe headache or confusion. Have unusual weakness or numbness. Feel faint. Have severe pain in your chest or abdomen. Vomit repeatedly. Have trouble breathing. These symptoms may be an emergency. Get help right away. Call 911. Do not wait to see if the symptoms will go away. Do not drive yourself to the hospital. Summary Hypertension is when the force of blood pumping through your arteries is too strong. If this condition is not controlled, it may put you at risk for serious complications. Your personal target blood pressure may vary depending on your medical conditions, your age, and other factors. For most people, a normal blood pressure is less than 120/80. Hypertension is treated with lifestyle changes, medicines, or a combination of both. Lifestyle changes include losing weight, eating a healthy,   low-sodium diet, exercising more, and limiting alcohol. This information is not intended to replace advice given to you by your health care provider. Make sure you discuss any questions you have with your health care provider. Document Revised: 12/28/2020 Document Reviewed: 12/28/2020 Elsevier Patient Education  2023 [employer]  

## 2022-03-31 NOTE — ED Triage Notes (Signed)
Patient here from home reporting ongoing hypertension this week. Reports being seen at fire station and told to come here.

## 2022-04-01 ENCOUNTER — Telehealth (HOSPITAL_COMMUNITY): Payer: Self-pay | Admitting: Emergency Medicine

## 2022-04-01 ENCOUNTER — Emergency Department (HOSPITAL_COMMUNITY): Payer: Medicare Other

## 2022-04-01 DIAGNOSIS — I10 Essential (primary) hypertension: Secondary | ICD-10-CM | POA: Diagnosis not present

## 2022-04-01 DIAGNOSIS — R0602 Shortness of breath: Secondary | ICD-10-CM | POA: Diagnosis not present

## 2022-04-01 LAB — BASIC METABOLIC PANEL
Anion gap: 8 (ref 5–15)
BUN: 21 mg/dL (ref 8–23)
CO2: 28 mmol/L (ref 22–32)
Calcium: 9.2 mg/dL (ref 8.9–10.3)
Chloride: 92 mmol/L — ABNORMAL LOW (ref 98–111)
Creatinine, Ser: 0.63 mg/dL (ref 0.44–1.00)
GFR, Estimated: >60 mL/min (ref >60)
Glucose, Bld: 113 mg/dL — ABNORMAL HIGH (ref 70–99)
Potassium: 4.2 mmol/L (ref 3.5–5.1)
Sodium: 128 mmol/L — ABNORMAL LOW (ref 135–145)

## 2022-04-01 LAB — CBC WITH DIFFERENTIAL/PLATELET
Abs Immature Granulocytes: 0.01 10*3/uL (ref 0.00–0.07)
Basophils Absolute: 0 10*3/uL (ref 0.0–0.1)
Basophils Relative: 1 %
Eosinophils Absolute: 0 10*3/uL (ref 0.0–0.5)
Eosinophils Relative: 0 %
HCT: 38.5 % (ref 36.0–46.0)
Hemoglobin: 12.7 g/dL (ref 12.0–15.0)
Immature Granulocytes: 0 %
Lymphocytes Relative: 8 %
Lymphs Abs: 0.5 10*3/uL — ABNORMAL LOW (ref 0.7–4.0)
MCH: 30.5 pg (ref 26.0–34.0)
MCHC: 33 g/dL (ref 30.0–36.0)
MCV: 92.3 fL (ref 80.0–100.0)
Monocytes Absolute: 0.5 10*3/uL (ref 0.1–1.0)
Monocytes Relative: 8 %
Neutro Abs: 5.3 10*3/uL (ref 1.7–7.7)
Neutrophils Relative %: 83 %
Platelets: 242 10*3/uL (ref 150–400)
RBC: 4.17 MIL/uL (ref 3.87–5.11)
RDW: 13.2 % (ref 11.5–15.5)
WBC: 6.3 10*3/uL (ref 4.0–10.5)
nRBC: 0 % (ref 0.0–0.2)

## 2022-04-01 LAB — BASIC METABOLIC PANEL WITH GFR
BUN/Creatinine Ratio: 36 (calc) — ABNORMAL HIGH (ref 6–22)
BUN: 20 mg/dL (ref 7–25)
CO2: 29 mmol/L (ref 20–32)
Calcium: 9.8 mg/dL (ref 8.6–10.4)
Chloride: 93 mmol/L — ABNORMAL LOW (ref 98–110)
Creat: 0.55 mg/dL — ABNORMAL LOW (ref 0.60–1.00)
Glucose, Bld: 102 mg/dL (ref 65–139)
Potassium: 4.5 mmol/L (ref 3.5–5.3)
Sodium: 130 mmol/L — ABNORMAL LOW (ref 135–146)
eGFR: 94 mL/min/{1.73_m2} (ref >=60)

## 2022-04-01 LAB — MAGNESIUM
Magnesium: 2.4 mg/dL (ref 1.5–2.5)
Magnesium: 2.7 mg/dL — ABNORMAL HIGH (ref 1.7–2.4)

## 2022-04-01 LAB — TROPONIN I (HIGH SENSITIVITY): Troponin I (High Sensitivity): 4 ng/L (ref <18)

## 2022-04-01 MED ORDER — METHOCARBAMOL 500 MG PO TABS
750.0000 mg | ORAL_TABLET | Freq: Once | ORAL | Status: AC
  Administered 2022-04-01: 750 mg via ORAL
  Filled 2022-04-01: qty 2

## 2022-04-01 MED ORDER — AMLODIPINE BESYLATE 5 MG PO TABS: 5.0000 mg | ORAL_TABLET | Freq: Once | ORAL | Status: DC

## 2022-04-01 MED ORDER — AMLODIPINE BESYLATE 5 MG PO TABS: 5.0000 mg | ORAL_TABLET | Freq: Every day | ORAL | 0 refills | Status: DC

## 2022-04-01 NOTE — ED Provider Notes (Signed)
EMERGENCY DEPARTMENT AT [facility] Provider Note   CSN: [id number] Arrival date & time: 03/31/22  2254     History  Chief Complaint  Patient presents with   Hypertension    Alicia [name] is a 77 y.o. female.  HPI     76yo female with history of basal cell carcinoma, hyponatremia, muscular dystrophy, peripheral neuropathy, atrial fibrillation on Xarelto, chronic cough for which she has seen pulmonology who presents with concern for elevated blood pressures.   Woke up Wednesday morning and had heart racing woke from sleep (dreamed chasing someone), Wednesday didn't feel well, Thursday and Friday, just started cutting back on salt tablets. Having headache, aches all over, dizziness, feeling badly. Went to PT Wednesday.  139/79 then, high for her.   Has loop monitor. Has not had afib recently, watch had not detected it, HR in 80s even with palpitations. Had ablation July, tapered off of metoprolol over the summer and off since October. Felt dizzy while she was on it. Neurologist tapered off of neurontin.   Reports that she feels badly, but is difficult to describe exactly the symptoms she is feeling.  Went to fire station and blood pressure was 200/100    Was seen yesterday at PCP [facility] for a walk, had worse PVCs while walking  Tapering off of salt tablets, worried about not being able to take salt tablets because she is worried they will make her blood pressure is high  HR was normal but was feeling palpitations  History of dizziness since covid shot, but was more pronounced (rare day can drive, but does have dizziness, always walks with rollator)  Denies numbness, weakness, difficulty talking or walking, visual changes or facial droop.   Distal arms and legs with tingling, similar to prior low sodium, has had it before, not currently having the symptoms  Headache comes and goes. Gets migraines, and when weather changes will change and feels symptoms  2-3 days ahead. Alric Quan and felt improved. Thinks it was mostly migraine.  This am was hungry had ensure. Now having headache and not sure if from hunger, migraine, bp or weather.   Yesterday am had nausea. No vomiting, no diarrhea.   Shortness of breath off and on . Particularly yesterday not now.  No chest pain, just at times feels tight but not on left, on right. Not sure when had it.   Tickle in throat, sees dr. [name].    Past Medical History:  Diagnosis Date   Bronchitis    Cancer ([place]) 2009   Basal Cell   Chronic migraine without aura, intractable, without status migrainosus    Diverticulosis 2010   Endometriosis    Low sodium levels    Malnutrition ([place])    MD (muscular dystrophy) ([place])    Meniere's disease, bilateral    [facility]    [facility]   Peripheral neuropathy    Dr Erling Cruz   Pneumonia    Sensorineural hearing loss, bilateral    Vertigo 2019     Home Medications Prior to Admission medications   Medication Sig Start Date End Date Taking? Authorizing Provider  acetaminophen (TYLENOL) 500 MG tablet Take 500 mg by mouth at bedtime as needed for moderate pain.    [provider]  amLODipine (NORVASC) 5 MG tablet Take 1 tablet (5 mg total) by mouth daily. 04/01/22   Gareth Morgan, MD  baclofen (LIORESAL) 10 MG tablet Take 10 mg by mouth at bedtime.  [provider]  butalbital-acetaminophen-caffeine (FIORICET) 50-325-40 MG tablet Take 1 tablet by mouth every 6 (six) hours as needed for migraine. 08/04/21   Lauree Chandler, NP  Calcium Citrate-Vitamin D (CALCIUM + D PO) Take 1 tablet by mouth as directed. 3-4 times daily    [provider]  Carboxymethylcellulose Sodium (THERATEARS) 0.25 % SOLN Place 1 drop into both eyes 4 (four) times daily.    [provider]  Cholecalciferol (VITAMIN D3) LIQD Take 1,000 Units by mouth daily.    [provider]  Evening Primrose Oil 1000 MG CAPS Take 1,000 mg by mouth  every evening.    [provider]  famotidine (PEPCID) 20 MG tablet One after supper 12/23/21   Tanda Rockers, MD  FEMRING 0.05 MG/24HR RING Place 1 each vaginally every 3 (three) months. 02/10/19   [provider]  Lutein 20 MG TABS Take 20 mg by mouth daily.     [provider]  MAGNESIUM CITRATE PO Take 300 mg by mouth daily.    [provider]  meclizine (ANTIVERT) 12.5 MG tablet TAKE 1 TO 2 TABLETS(12.5 TO 25 MG) BY MOUTH THREE TIMES DAILY AS NEEDED FOR DIZZINESS 10/13/21   Lauree Chandler, NP  methocarbamol (ROBAXIN) 750 MG tablet Take 750 mg by mouth every 6 (six) hours.    [provider]  Multiple Vitamin (MULTIVITAMIN WITH MINERALS) TABS tablet Take 1 tablet by mouth daily.    [provider]  Multiple Vitamins-Minerals (PRESERVISION AREDS 2) CAPS Take 1 capsule by mouth 2 (two) times daily.    [provider]  Omega-3 1000 MG CAPS Take 1,000 mg by mouth 2 (two) times daily.    [provider]  ondansetron (ZOFRAN-ODT) 4 MG disintegrating tablet DISSOLVE 1 TABLET(4 MG) ON THE TONGUE EVERY 6 HOURS AS NEEDED FOR NAUSEA OR VOMITING 10/28/21   Lauree Chandler, NP  pantoprazole (PROTONIX) 40 MG tablet Take 1 tablet (40 mg total) by mouth daily. Take 30-60 min before first meal of the day 12/23/21   Tanda Rockers, MD  sodium chloride 1 g tablet Take 1 g by mouth 4 (four) times daily.    [provider]  UBRELVY 100 MG TABS Take 1-2 tablets by mouth 2 (two) times daily as needed. 11/29/20   [provider]  XARELTO 20 MG TABS tablet TAKE 1 TABLET DAILY WITH SUPPER 03/30/22   [name], MD      Allergies    Biaxin [clarithromycin], Sulfonamide derivatives, Tequin [gatifloxacin], Demerol, Lactose intolerance (gi), Nsaids, Risedronate sodium, Rofecoxib, Alendronate sodium, Aspirin, Cardizem [diltiazem hcl], Eliquis [apixaban], Fish allergy, Benadryl [diphenhydramine hcl], and Other    Review of  Systems   Review of Systems  Physical Exam Updated Vital Signs BP (!) 153/73   Pulse 79   Temp 98.2 F (36.8 C) (Oral)   Resp 13   SpO2 94%  Physical Exam Vitals and nursing note reviewed.  Constitutional:      General: She is not in acute distress.    Appearance: Normal appearance. She is well-developed. She is not ill-appearing or diaphoretic.  HENT:     Head: Normocephalic and atraumatic.  Eyes:     General: No visual field deficit.    Extraocular Movements: Extraocular movements intact.     Conjunctiva/sclera: Conjunctivae normal.     Pupils: Pupils are equal, round, and reactive to light.  Cardiovascular:     Rate and Rhythm: Normal rate and regular rhythm.  Pulses: Normal pulses.     Heart sounds: Normal heart sounds. No murmur heard.    No friction rub. No gallop.  Pulmonary:     Effort: Pulmonary effort is normal. No respiratory distress.     Breath sounds: Normal breath sounds. No wheezing or rales.  Abdominal:     General: There is no distension.     Palpations: Abdomen is soft.     Tenderness: There is no abdominal tenderness. There is no guarding.  Musculoskeletal:        General: No swelling or tenderness.     Cervical back: Normal range of motion.  Skin:    General: Skin is warm and dry.     Findings: No erythema or rash.  Neurological:     General: No focal deficit present.     Mental Status: She is alert and oriented to person, place, and time.     GCS: GCS eye subscore is 4. GCS verbal subscore is 5. GCS motor subscore is 6.     Cranial Nerves: No cranial nerve deficit, dysarthria or facial asymmetry.     Sensory: No sensory deficit.     Motor: No weakness or tremor.     Coordination: Coordination normal. Finger-Nose-Finger Test normal.     Gait: Gait normal.     ED Results / Procedures / Treatments   Labs (all labs ordered are listed, but only abnormal results are displayed) Labs Reviewed  CBC WITH DIFFERENTIAL/PLATELET - Abnormal; Notable  for the following components:      Result Value   Lymphs Abs 0.5 (*)    All other components within normal limits  MAGNESIUM - Abnormal; Notable for the following components:   Magnesium 2.7 (*)    All other components within normal limits  BASIC METABOLIC PANEL - Abnormal; Notable for the following components:   Sodium 128 (*)    Chloride 92 (*)    Glucose, Bld 113 (*)    All other components within normal limits  TROPONIN I (HIGH SENSITIVITY)  TROPONIN I (HIGH SENSITIVITY)    EKG EKG Interpretation  Date/Time:  Saturday April 01 2022 00:43:12 EST Ventricular Rate:  77 PR Interval:  142 QRS Duration: 89 QT Interval:  394 QTC Calculation: 446 R Axis:   74 Text Interpretation: Normal sinus rhythm Left ventricular hypertrophy No significant change since last tracing Confirmed by Kommor, Madison ([id number]) on 04/01/2022 12:48:36 AM  Radiology DG Chest 1 View  Result Date: 04/01/2022 CLINICAL DATA:  Shortness of breath EXAM: CHEST  1 VIEW COMPARISON:  Radiographs 12/23/2021 and CT 01/16/2022 FINDINGS: Hyperinflation. Biapical pleural-parenchymal scarring and reticulonodular interstitial thickening. Right basilar atelectasis/scarring. No focal pneumonia, pleural effusion, or pneumothorax. Stable cardiomediastinal silhouette. Aortic atherosclerotic calcification. Loop recorder. Breast implants. IMPRESSION: Scarring and reticulonodular thickening greatest in the apices. Appearance is grossly similar to CT 01/16/2022 given differences in technique. Electronically Signed   By: Placido Sou M.D.   On: 04/01/2022 00:59    Procedures Procedures    Medications Ordered in ED Medications  amLODipine (NORVASC) tablet 5 mg (has no administration in time range)  methocarbamol (ROBAXIN) tablet 750 mg (750 mg Oral Given 04/01/22 0911)    ED Course/ Medical Decision Making/ A&P            77yo female with history of basal cell carcinoma, hyponatremia, muscular dystrophy, peripheral  neuropathy, atrial fibrillation on Xarelto, chronic cough for which she has seen pulmonology who presents with concern for elevated blood pressures with associated nonspecific symptoms.  Labs were completed and personally evaluated interpreted by me show a normal troponin, no anemia, no leukocytosis.  She describes some intermittent chest tightness on ROS, with symptoms present the day before presentation and have low suspicion for ACS. CXR completed and personally abided by me without signs of pulmonary edema or pneumonia.  It does show findings per radiology similar to prior with scarring reticulonodular thickening in the apices.    She has a normal neurologic exam, without symptoms on history to suggest new acute focal neurologic abnormality.  Discussed with her headache it is reasonable to obtain head head CT, however given her headache is mild and similar to headaches she has had before, with blood pressures in the 140s at this time, have low suspicion clinically for an acute intracerebral hemorrhage or acute CVA. She agrees to defer CT at this time.   Labs obtained show sodium 128 from prior values of 130.  This is in the setting of her stopping her salt tablets.  She reports she had her hyponatremia worked up previously with Dr. Esau Grew and the conclusion was for her to take salt tablets.  Recommended that she continue to take the salt tablets even though she does have fear of elevated blood pressures.  Discussed we will treat her blood pressures with amlodipine and she can continue her salt tablets.  SHe is seeing her PCP on Monday. Discussed strict return precautions.         Final Clinical Impression(s) / ED Diagnoses Final diagnoses:  Hyponatremia  Hypertension, unspecified type    Rx / DC Orders ED Discharge Orders          Ordered    amLODipine (NORVASC) 5 MG tablet  Daily,   Status:  Discontinued        04/01/22 1144              Gareth Morgan, MD 04/01/22  2300

## 2022-04-03 ENCOUNTER — Ambulatory Visit (INDEPENDENT_AMBULATORY_CARE_PROVIDER_SITE_OTHER): Payer: Medicare Other | Admitting: Nurse Practitioner

## 2022-04-03 ENCOUNTER — Encounter: Payer: Self-pay | Admitting: Nurse Practitioner

## 2022-04-03 VITALS — BP 130/70 | HR 88 | Temp 97.1°F | Ht 66.0 in | Wt 113.8 lb

## 2022-04-03 DIAGNOSIS — R42 Dizziness and giddiness: Secondary | ICD-10-CM

## 2022-04-03 DIAGNOSIS — I10 Essential (primary) hypertension: Secondary | ICD-10-CM | POA: Diagnosis not present

## 2022-04-03 DIAGNOSIS — I480 Paroxysmal atrial fibrillation: Secondary | ICD-10-CM

## 2022-04-03 DIAGNOSIS — E871 Hypo-osmolality and hyponatremia: Secondary | ICD-10-CM

## 2022-04-03 NOTE — Progress Notes (Unsigned)
Careteam: Patient Care Team: Lauree Chandler, NP as PCP - General (Geriatric Medicine) [name], MD as PCP - Electrophysiology (Cardiology) Deliah Goody, PA-C as Consulting Physician (Physician Assistant) Danella Sensing, MD as Consulting Physician (Dermatology) Everett Graff, MD as Consulting Physician (Obstetrics and Gynecology) Vicie Mutters, MD as Consulting Physician (Otolaryngology) Marshell Garfinkel, MD as Consulting Physician (Pulmonary Disease) Gayla Medicus, MD as Referring Physician (Neurology)  PLACE OF SERVICE:  [place] Directive information Does Patient Have a Medical Advance Directive?: No;Yes, Type of Advance Directive: [place];Living will  Allergies  Allergen Reactions   Biaxin [Clarithromycin] Anaphylaxis   Sulfonamide Derivatives Itching and Rash   Tequin [Gatifloxacin] Anaphylaxis   Demerol Nausea And Vomiting   Lactose Intolerance (Gi) Other (See Comments)    Mild abdominal pain, gas, bloating   Nsaids Other (See Comments)    GI upset/stomach pain   Risedronate Sodium Nausea And Vomiting and Other (See Comments)     GI upset/heartburn    Rofecoxib Nausea And Vomiting and Other (See Comments)    GI upset/heartburn    Alendronate Sodium Nausea And Vomiting   Aspirin Other (See Comments)    Abdominal pain    Cardizem [Diltiazem Hcl] Hives   Eliquis [Apixaban] Hives   Fish Allergy Nausea And Vomiting    Can take fish oil   Benadryl [Diphenhydramine Hcl] Palpitations   Other Itching and Rash    EKG LEADS caused burning sores     Chief Complaint  Patient presents with   Follow-up    ER follow-up. Discuss need for shingrix, additional covid boosters, and AWV (pending for 04/07/22). Patient c/o dizziness.      HPI: Patient is a 77 y.o. female seen today for ED follow up.  Reports she came to office due to elevated bp and blood pressure was not that elevated.  She went home and blood pressure became more  elevated at night.  She went to the fire department and blood pressure was very elevated and was sent to the ED.  Blood pressure monitored in the ED and  was placed on norvasc 5 mg. She took yesterday and today.  ED doctor recommended that she follow up with her cardiologist.  She has suspended her PT this week due to starting new medication.  Also having worsening back pain from sitting in the ED chair.   She states she had a low grade headache, dizzy and off balance and fatigued.   Reports she has a little bit of nausea and decrease in appetite  She is now taking salt tablets 4 times daily.  She is very dry on these She taking 1.5 in the morning, 1.5 noon, 1 tablet in the afternoon to avoid taking too close to bedtime.   She has not been monitoring blood pressure since she has been at home because it makes her very anxious   Review of Systems:  Review of Systems  Constitutional:  Negative for chills, fever and weight loss.  HENT:  Negative for tinnitus.   Respiratory:  Negative for cough, sputum production and shortness of breath.   Cardiovascular:  Positive for palpitations. Negative for chest pain and leg swelling.  Gastrointestinal:  Negative for abdominal pain, constipation, diarrhea and heartburn.  Genitourinary:  Negative for dysuria, frequency and urgency.  Musculoskeletal:  Negative for back pain, falls, joint pain and myalgias.  Skin: Negative.   Neurological:  Positive for dizziness, tingling and headaches.  Psychiatric/Behavioral:  Negative for depression  and memory loss. The patient does not have insomnia.     Past Medical History:  Diagnosis Date   Bronchitis    Cancer ([place]) 2009   Basal Cell   Chronic migraine without aura, intractable, without status migrainosus    Diverticulosis 2010   Endometriosis    Low sodium levels    Malnutrition ([facility])    MD (muscular dystrophy) ([place])    Meniere's disease, bilateral    [facility]    [facility]    Peripheral neuropathy    Dr Erling Cruz   Pneumonia    Sensorineural hearing loss, bilateral    Vertigo 2019   Past Surgical History:  Procedure Laterality Date   ABDOMINAL HYSTERECTOMY  1980 or 1981   for Endometriosis   APPENDECTOMY  1965   high school; low grade appendiceal cancer   ATRIAL FIBRILLATION ABLATION N/A 06/17/2020   Procedure: ATRIAL FIBRILLATION ABLATION;  Surgeon: Thompson Grayer, MD;  Location: [facility];  Service: Cardiovascular;  Laterality: N/A;   BREAST ENHANCEMENT SURGERY Bilateral 1979   COLONOSCOPY  01/2012   Tics; Dr Teena Irani   ESI      X 3 in 2003 & 02/17/2011 for L4-S1 symptoms   implantable loop recorder placement  01/14/2021   Medtronic Reveal Linq model LNQ 22 ([place] [id number]) implantable loop recorder   LUMBAR LAMINECTOMY  12/2012   [place] , [place]   TONSILLECTOMY  1950 or 1951   TUBAL LIGATION     vocal cords stripped  1970   Social History:   reports that she has never smoked. She has never used smokeless tobacco. She reports that she does not currently use alcohol. She reports that she does not use drugs.  Family History  Problem Relation Age of Onset   Alcohol abuse Mother    Hypertension Mother    Diabetes Mother 68   Stroke Mother 12   Dementia Mother    Kidney failure Mother    Osteoporosis Mother    Alzheimer's disease Father 47   Cancer Maternal Aunt 68       pancreatic   Arthritis Sister 27       knee replacement   Hyperlipidemia Sister    Lung disease Maternal Grandmother    Lung disease Maternal Grandfather    Hyperlipidemia Sister 56   Other Son        recent back injury   Other Daughter        recent back injury; knee issues when playing tennis   Heart disease Neg Hx    Heart attack Neg Hx     Medications: Patient's Medications  New Prescriptions   No medications on file  Previous Medications   ACETAMINOPHEN (TYLENOL) 500 MG TABLET    Take 500 mg by mouth at bedtime as needed for moderate pain.   AMLODIPINE  (NORVASC) 5 MG TABLET    Take 1 tablet (5 mg total) by mouth daily.   BACLOFEN (LIORESAL) 10 MG TABLET    Take 10 mg by mouth at bedtime.   BUTALBITAL-ACETAMINOPHEN-CAFFEINE (FIORICET) 50-325-40 MG TABLET    Take 1 tablet by mouth every 6 (six) hours as needed for migraine.   CALCIUM CITRATE-VITAMIN D (CALCIUM + D PO)    Take 1 tablet by mouth as directed. 3-4 times daily   CARBOXYMETHYLCELLULOSE SODIUM (THERATEARS) 0.25 % SOLN    Place 1 drop into both eyes 4 (four) times daily.   CHLORPHENIRAMINE (CHLOR-TRIMETON) 4 MG TABLET    Take 4-8  mg by mouth at bedtime.   CHOLECALCIFEROL (VITAMIN D3) LIQD    Take 1,000 Units by mouth daily.   EVENING PRIMROSE OIL 1000 MG CAPS    Take 1,000 mg by mouth every evening.   FAMOTIDINE (PEPCID) 20 MG TABLET    One after supper   FEMRING 0.05 MG/24HR RING    Place 1 each vaginally every 3 (three) months.   LUTEIN 20 MG TABS    Take 20 mg by mouth daily.    MAGNESIUM CITRATE PO    Take 300 mg by mouth daily.   MECLIZINE (ANTIVERT) 12.5 MG TABLET    TAKE 1 TO 2 TABLETS(12.5 TO 25 MG) BY MOUTH THREE TIMES DAILY AS NEEDED FOR DIZZINESS   METHOCARBAMOL (ROBAXIN) 750 MG TABLET    Take 750 mg by mouth every 6 (six) hours.   MULTIPLE VITAMIN (MULTIVITAMIN WITH MINERALS) TABS TABLET    Take 1 tablet by mouth daily.   MULTIPLE VITAMINS-MINERALS (PRESERVISION AREDS 2) CAPS    Take 1 capsule by mouth 2 (two) times daily.   OMEGA-3 1000 MG CAPS    Take 1,000 mg by mouth 2 (two) times daily.   ONDANSETRON (ZOFRAN-ODT) 4 MG DISINTEGRATING TABLET    DISSOLVE 1 TABLET(4 MG) ON THE TONGUE EVERY 6 HOURS AS NEEDED FOR NAUSEA OR VOMITING   PANTOPRAZOLE (PROTONIX) 40 MG TABLET    Take 1 tablet (40 mg total) by mouth daily. Take 30-60 min before first meal of the day   SODIUM CHLORIDE 1 G TABLET    Take 1 g by mouth 4 (four) times daily.   UBRELVY 100 MG TABS    Take 1-2 tablets by mouth 2 (two) times daily as needed.   XARELTO 20 MG TABS TABLET    TAKE 1 TABLET DAILY WITH SUPPER   Modified Medications   No medications on file  Discontinued Medications   No medications on file    Physical Exam:  Vitals:   04/03/22 1404  Pulse: 88  Temp: (!) 97.1 F (36.2 C)  TempSrc: Temporal  SpO2: 98%  Weight: 113 lb 12.8 oz (51.6 kg)  Height: '5\' 6"'$  (1.676 m)   Body mass index is 18.37 kg/m. Wt Readings from Last 3 Encounters:  04/03/22 113 lb 12.8 oz (51.6 kg)  03/31/22 113 lb 12.8 oz (51.6 kg)  12/23/21 113 lb 3.2 oz (51.3 kg)    Physical Exam***  Labs reviewed: Basic Metabolic Panel: Recent Labs    10/06/21 1149 03/31/22 1502 04/01/22 0006  NA 130* 130* 128*  K 5.1 4.5 4.2  CL 92* 93* 92*  CO2 '25 29 28  '$ GLUCOSE 132* 102 113*  BUN '16 20 21  '$ CREATININE 0.56* 0.55* 0.63  CALCIUM 9.8 9.8 9.2  MG 2.4* 2.4 2.7*  TSH 3.540  --   --    Liver Function Tests: Recent Labs    07/15/21 1519  AST 19  ALT 13  BILITOT 0.4  PROT 6.8   No results for input(s): "LIPASE", "AMYLASE" in the last 8760 hours. No results for input(s): "AMMONIA" in the last 8760 hours. CBC: Recent Labs    07/15/21 1519 10/06/21 1149 04/01/22 0006  WBC 4.8 7.3 6.3  NEUTROABS 3,562 5.9 5.3  HGB 13.7 14.3 12.7  HCT 41.4 42.3 38.5  MCV 92.8 91 92.3  PLT 274 319 242   Lipid Panel: No results for input(s): "CHOL", "HDL", "LDLCALC", "TRIG", "CHOLHDL", "LDLDIRECT" in the last 8760 hours. TSH: Recent Labs    10/06/21 1149  TSH 3.540   A1C: Lab Results  Component Value Date   HGBA1C 5.6 03/03/2011     Assessment/Plan There are no diagnoses linked to this encounter.    No follow-ups on file.: ***  Alicia [name] K. [place], [facility] [phone]

## 2022-04-04 ENCOUNTER — Encounter: Payer: Medicare Other | Admitting: Nurse Practitioner

## 2022-04-05 NOTE — Progress Notes (Signed)
[facility] Summary Report / Loop Recorder

## 2022-04-07 ENCOUNTER — Encounter: Payer: Self-pay | Admitting: Nurse Practitioner

## 2022-04-07 ENCOUNTER — Ambulatory Visit (INDEPENDENT_AMBULATORY_CARE_PROVIDER_SITE_OTHER): Payer: Medicare Other | Admitting: Nurse Practitioner

## 2022-04-07 VITALS — BP 118/76 | HR 82 | Temp 97.1°F | Ht 66.0 in | Wt 114.0 lb

## 2022-04-07 DIAGNOSIS — Z0000 Encounter for general adult medical examination without abnormal findings: Secondary | ICD-10-CM | POA: Diagnosis not present

## 2022-04-07 NOTE — Progress Notes (Signed)
Subjective:   Alicia [name] is a 77 y.o. female who presents for Medicare Annual (Subsequent) preventive examination.  Review of Systems     Cardiac Risk Factors include: advanced age (>46mn, >>43women);hypertension;sedentary lifestyle     Objective:    Today's Vitals   04/07/22 1501 04/07/22 1511 04/07/22 1534  BP: 118/76    Pulse: (!) 102 82   Temp: (!) 97.1 F (36.2 C)    TempSrc: Temporal    SpO2: 99%    Weight: 114 lb (51.7 kg)    Height: '5\' 6"'$  (1.676 m)    PainSc:   2    Body mass index is 18.4 kg/m.     04/07/2022    3:02 PM 04/03/2022    2:04 PM 03/31/2022   11:33 PM 07/15/2021   11:40 AM 04/15/2021    3:47 PM 03/31/2021    1:28 PM 01/07/2021    3:25 PM  Advanced Directives  Does Patient Have a Medical Advance Directive? No;Yes No;Yes No No Yes Yes Yes  Type of AAcademic librarianLiving will HDeRidderLiving will   HOhio CityLiving will HClintonLiving will HBriarcliffLiving will  Does patient want to make changes to medical advance directive? No - Patient declined    No - Patient declined No - Patient declined No - Patient declined  Copy of HAstorin Chart? No - copy requested No - copy requested   No - copy requested No - copy requested Yes - validated most recent copy scanned in chart (See row information)  Would patient like information on creating a medical advance directive?    No - Patient declined       Current Medications (verified) Outpatient Encounter Medications as of 04/07/2022  Medication Sig   acetaminophen (TYLENOL) 500 MG tablet Take 500 mg by mouth at bedtime as needed for moderate pain.   amLODipine (NORVASC) 5 MG tablet Take 1 tablet (5 mg total) by mouth daily.   baclofen (LIORESAL) 10 MG tablet Take 10 mg by mouth at bedtime.   butalbital-acetaminophen-caffeine (FIORICET) 50-325-40 MG tablet Take 1 tablet by mouth  every 6 (six) hours as needed for migraine.   Calcium Citrate-Vitamin D (CALCIUM + D PO) Take 1 tablet by mouth as directed. 3-4 times daily   Carboxymethylcellulose Sodium (THERATEARS) 0.25 % SOLN Place 1 drop into both eyes 4 (four) times daily.   chlorpheniramine (CHLOR-TRIMETON) 4 MG tablet Take 4-8 mg by mouth at bedtime.   Cholecalciferol (VITAMIN D3) LIQD Take 1,000 Units by mouth daily.   Evening Primrose Oil 1000 MG CAPS Take 1,000 mg by mouth every evening.   famotidine (PEPCID) 20 MG tablet One after supper   FEMRING 0.05 MG/24HR RING Place 1 each vaginally every 3 (three) months.   Lutein 20 MG TABS Take 20 mg by mouth daily.    MAGNESIUM CITRATE PO Take 300 mg by mouth daily.   meclizine (ANTIVERT) 12.5 MG tablet TAKE 1 TO 2 TABLETS(12.5 TO 25 MG) BY MOUTH THREE TIMES DAILY AS NEEDED FOR DIZZINESS   methocarbamol (ROBAXIN) 750 MG tablet Take 750 mg by mouth every 6 (six) hours.   Multiple Vitamin (MULTIVITAMIN WITH MINERALS) TABS tablet Take 1 tablet by mouth daily.   Multiple Vitamins-Minerals (PRESERVISION AREDS 2) CAPS Take 1 capsule by mouth 2 (two) times daily.   Omega-3 1000 MG CAPS Take 1,000 mg by mouth 2 (two) times daily.  ondansetron (ZOFRAN-ODT) 4 MG disintegrating tablet DISSOLVE 1 TABLET(4 MG) ON THE TONGUE EVERY 6 HOURS AS NEEDED FOR NAUSEA OR VOMITING   pantoprazole (PROTONIX) 40 MG tablet Take 1 tablet (40 mg total) by mouth daily. Take 30-60 min before first meal of the day   sodium chloride 1 g tablet Take 1 g by mouth 4 (four) times daily.   UBRELVY 100 MG TABS Take 1-2 tablets by mouth 2 (two) times daily as needed.   XARELTO 20 MG TABS tablet TAKE 1 TABLET DAILY WITH SUPPER   No facility-administered encounter medications on file as of 04/07/2022.    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   Chronic migraine without aura, intractable, without status migrainosus    Diverticulosis 2010   Endometriosis    Low sodium levels    Malnutrition ([place])    MD (muscular dystrophy) ([place])    Meniere's disease, bilateral    [facility]   Peripheral neuropathy    Dr Erling Cruz   Pneumonia    Sensorineural hearing loss, bilateral    Vertigo 2019   Past Surgical History:  Procedure Laterality Date   ABDOMINAL HYSTERECTOMY  1980 or 1981   for Endometriosis   APPENDECTOMY  1965   high school; low grade appendiceal cancer   ATRIAL FIBRILLATION ABLATION N/A 06/17/2020   Procedure: ATRIAL FIBRILLATION ABLATION;  Surgeon: Thompson Grayer, MD;  Location: [facility];  Service: Cardiovascular;  Laterality: N/A;   BREAST ENHANCEMENT SURGERY Bilateral 1979   COLONOSCOPY  01/2012   Tics; Dr Teena Irani   ESI      X 3 in 2003 & 02/17/2011 for L4-S1 symptoms   implantable loop recorder placement  01/14/2021   Medtronic Reveal Linq model LNQ 22 ([place] [id number]) implantable loop recorder   LUMBAR LAMINECTOMY  12/2012   [place]   TONSILLECTOMY  1950 or 1951   TUBAL LIGATION     vocal cords stripped  1970   Family History  Problem Relation Age of Onset   Alcohol abuse Mother    Hypertension Mother    Diabetes Mother 6   Stroke Mother 27   Dementia Mother    Kidney failure Mother    Osteoporosis Mother    Alzheimer's disease Father 42   Cancer Maternal Aunt 50       pancreatic   Arthritis Sister 41       knee replacement   Hyperlipidemia Sister    Lung disease Maternal Grandmother    Lung disease Maternal Grandfather    Hyperlipidemia Sister 33   Other Son        recent back injury   Other Daughter        recent back injury; knee issues when playing tennis   Heart  disease Neg Hx    Heart attack Neg Hx    Social History   Socioeconomic History   Marital status: Married    Spouse name: Not on file   Number of children: Not on file   Years of education: Not on file   Highest education level: Not on file  Occupational History   Not on file  Tobacco Use   Smoking status:  Never   Smokeless tobacco: Never  Vaping Use   Vaping Use: Never used  Substance and Sexual Activity   Alcohol use: Not Currently   Drug use: Never   Sexual activity: Not Currently  Other Topics Concern   Not on file  Social History Narrative   Social History      Diet? Trying to gain weight      Do you drink/eat things with caffeine? 1 mug hot tea with breakfast per day      Marital status?  Married 59 years                                  What year were you married? 1970      Do you live in a house, apartment, assisted living, condo, trailer, etc.? House (46 years)      Is it one or more stories? 2 stories and walk up finished attic      How many persons live in your home? 2      Do you have any pets in your home? (please list) none      Highest level of education completed? 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     yes                                 Type & how often? Walk in neighborhood 4 - 6 times per week if possible      Advanced Directives (Lawyer just beginning to work on these things)      Do you have a living will?      Do you have a DNR form?                    no              If not, do you want to discuss one?      Do you have signed POA/HPOA for forms?       Functional Status      Do you have difficulty bathing or dressing yourself? no      Do you have difficulty preparing food or eating? no      Do you have difficulty managing your medications? no      Do you have difficulty managing your finances? no      Do you have  difficulty affording your medications? no   Social Determinants of Health   Financial Resource Strain: Not on file  Food Insecurity: Not on file  Transportation Needs: Not on file  Physical Activity: Not on file  Stress: Not on file  Social Connections: Not on file    Tobacco Counseling Counseling given: Not Answered   Clinical Intake:  Pre-visit preparation completed: Yes  Pain Score: 2  Pain Location: Back     BMI - recorded: 18.4 Nutritional Status: BMI <19  Underweight Diabetes: No  How often do you need to have someone help you when you read instructions, pamphlets, or other written materials from your doctor or pharmacy?: 1 - Never  Diabetic?no         Activities of Daily Living    04/07/2022    3:29 PM  In your present state of  health, do you have any difficulty performing the following activities:  Hearing? 1  Vision? 0  Difficulty concentrating or making decisions? 0  Walking or climbing stairs? 0  Dressing or bathing? 1  Comment rarely will have trouble dressing  Doing errands, shopping? 1  Comment does not drive  Preparing Food and eating ? N  Using the Toilet? N  In the past six months, have you accidently leaked urine? Y  Do you have problems with loss of bowel control? N  Managing your Medications? N  Managing your Finances? N  Housekeeping or managing your Housekeeping? N    Patient Care Team: Lauree Chandler, NP as PCP - General (Geriatric Medicine) [name], MD as PCP - Electrophysiology (Cardiology) Deliah Goody, PA-C as Consulting Physician (Physician Assistant) Danella Sensing, MD as Consulting Physician (Dermatology) Everett Graff, MD as Consulting Physician (Obstetrics and Gynecology) Vicie Mutters, MD as Consulting Physician (Otolaryngology) Marshell Garfinkel, MD as Consulting Physician (Pulmonary Disease) Gayla Medicus, MD as Referring Physician (Neurology)  Indicate any recent Medical Services you may have received  from other than [facility] providers in the past year (date may be approximate).     Assessment:   This is a routine wellness examination for [place].  Hearing/Vision screen Hearing Screening - Comments:: Patient with hearing issues in which she has devices to assist with  Vision Screening - Comments:: Last eye exam was last year, pending appointment 04/13/2022, Dr.Miller,Sally   Dietary issues and exercise activities discussed: Current Exercise Habits: The patient does not participate in regular exercise at present   Goals Addressed   None    Depression Screen    04/07/2022    3:07 PM 03/31/2021    1:20 PM 03/29/2020    2:37 PM 03/28/2019    3:28 PM 03/19/2018    9:11 AM 02/14/2018    9:19 AM 08/20/2012    9:30 AM  PHQ 2/9 Scores  PHQ - 2 Score 0 0 0 0 0 0 0    Fall Risk    04/07/2022    3:06 PM 04/03/2022    2:04 PM 03/31/2022    2:12 PM 07/15/2021    3:29 PM 04/15/2021    3:48 PM  [place] in the past year? 0 0 0 0 0  Number falls in past yr: 0 0 0 0 0  Injury with Fall? 0 0 0 0 0  Risk for fall due to : No Fall Risks No Fall Risks No Fall Risks No Fall Risks No Fall Risks  Follow up Falls evaluation completed Falls evaluation completed Falls evaluation completed Falls evaluation completed Falls evaluation completed    FALL RISK PREVENTION PERTAINING TO THE HOME:  Any stairs in or around the home? Yes  If so, are there any without handrails? No  Home free of loose throw rugs in walkways, pet beds, electrical cords, etc? No  Adequate lighting in your home to reduce risk of falls? Yes   ASSISTIVE DEVICES UTILIZED TO PREVENT FALLS:  Life alert? No  Use of a cane, walker or w/c? Yes  Grab bars in the bathroom? Yes  Shower chair or bench in shower? Yes  Elevated toilet seat or a handicapped toilet? Yes   TIMED UP AND GO:  Was the test performed? No .   Cognitive Function:    04/07/2022    3:10 PM 03/19/2018    9:14 AM  MMSE - Mini Mental State Exam  Orientation  to time 5 5  Orientation to Place 5 5  Registration 3 3  Attention/ Calculation 5 5  Recall 3 3  Language- name 2 objects 2 2  Language- repeat 1 1  Language- follow 3 step command 3 3  Language- read & follow direction 1 1  Write a sentence 1 1  Copy design 1 1  Total score 30 30        03/31/2021    1:28 PM 03/29/2020    2:40 PM 03/28/2019    3:29 PM  6CIT Screen  What Year? 0 points 0 points 0 points  What month? 0 points 0 points 0 points  What time? 0 points 0 points 0 points  Count back from 20 0 points 0 points 0 points  Months in reverse 0 points 0 points 0 points  Repeat phrase 0 points 0 points 0 points  Total Score 0 points 0 points 0 points    Immunizations Immunization History  Administered Date(s) Administered   Fluad Quad(high Dose 65+) 02/18/2019, 01/07/2021, 03/07/2022   Influenza Whole 01/27/2008   Influenza, High Dose Seasonal PF 12/05/2013, 01/25/2018   Influenza-Unspecified 01/27/2008   PFIZER(Purple Top)SARS-COV-2 Vaccination 04/18/2019   Pneumococcal Conjugate-13 11/19/2013   Pneumococcal Polysaccharide-23 02/14/2018   Td 01/03/2005   Tdap 01/03/2017   Zoster, Live 02/23/2009    TDAP status: Up to date  Flu Vaccine status: Up to date  Pneumococcal vaccine status: Up to date  Covid-19 vaccine status: Information provided on how to obtain vaccines.   Qualifies for Shingles Vaccine? Yes   Zostavax completed No   Shingrix Completed?: No.    Education has been provided regarding the importance of this vaccine. Patient has been advised to call insurance company to determine out of pocket expense if they have not yet received this vaccine. Advised may also receive vaccine at local pharmacy or Health Dept. Verbalized acceptance and understanding.  Screening Tests Health Maintenance  Topic Date Due   COVID-19 Vaccine (2 - 2023-24 season) 04/23/2022 (Originally 11/04/2021)   Zoster Vaccines- Shingrix (1 of 2) 07/06/2022 (Originally 03/03/1995)    Medicare Annual Wellness (AWV)  04/08/2023   DTaP/Tdap/Td (3 - Td or Tdap) 01/04/2027   Pneumonia Vaccine 53+ Years old  Completed   INFLUENZA VACCINE  Completed   DEXA SCAN  Completed   Hepatitis C Screening  Completed   HPV VACCINES  Aged Out   COLONOSCOPY (Pts 45-41yr Insurance coverage will need to be confirmed)  Discontinued    Health Maintenance  There are no preventive care reminders to display for this patient.   Colorectal cancer screening: No longer required.   Mammogram status: No longer required due to age.  Bone Density status: Ordered by OBGYN. Pt provided with contact info and advised to call to schedule appt.  Lung Cancer Screening: (Low Dose CT Chest recommended if Age 78-80years, 30 pack-year currently smoking OR have quit w/in 15years.) does not qualify.   Lung Cancer Screening Referral: na  Additional Screening:  Hepatitis C Screening: does qualify; Completed 2022  Vision Screening: Recommended annual ophthalmology exams for early detection of glaucoma and other disorders of the eye. Is the patient up to date with their annual eye exam?  Yes  Who is the provider or what is the name of the office in which the patient attends annual eye exams? Dr MSabra HeckIf pt is not established with a provider, would they like to be referred to a provider to establish care? No .   Dental Screening: Recommended annual dental  exams for proper oral hygiene  Community Resource Referral / Chronic Care Management: CRR required this visit?  No   CCM required this visit?  No      Plan:     I have personally reviewed and noted the following in the patient's chart:   Medical and social history Use of alcohol, tobacco or illicit drugs  Current medications and supplements including opioid prescriptions. Patient is not currently taking opioid prescriptions. Functional ability and status Nutritional status Physical activity Advanced directives List of other  physicians Hospitalizations, surgeries, and ER visits in previous 12 months Vitals Screenings to include cognitive, depression, and falls Referrals and appointments  In addition, I have reviewed and discussed with patient certain preventive protocols, quality metrics, and best practice recommendations. A written personalized care plan for preventive services as well as general preventive health recommendations were provided to patient.     Lauree Chandler, NP   04/07/2022   Place of service: [facility]

## 2022-04-07 NOTE — Patient Instructions (Signed)
Alicia [name] , Thank you for taking time to come for your Medicare Wellness Visit. I appreciate your ongoing commitment to your health goals. Please review the following plan we discussed and let me know if I can assist you in the future.   Screening recommendations/referrals: Colonoscopy aged out Mammogram aged out Bone Density done by GYN Recommended yearly ophthalmology/optometry visit for glaucoma screening and checkup Recommended yearly dental visit for hygiene and checkup  Vaccinations: Influenza vaccine- due annually in September/October Pneumococcal vaccine up to date Tdap vaccine up to date Shingles vaccine     Advanced directives: to bring copy to office to place on file.   Conditions/risks identified: advanced age, fall risk.   Next appointment: yearly for awv   Preventive Care 75 Years and Older, Female Preventive care refers to lifestyle choices and visits with your health care provider that can promote health and wellness. What does preventive care include? A yearly physical exam. This is also called an annual well check. Dental exams once or twice a year. Routine eye exams. Ask your health care provider how often you should have your eyes checked. Personal lifestyle choices, including: Daily care of your teeth and gums. Regular physical activity. Eating a healthy diet. Avoiding tobacco and drug use. Limiting alcohol use. Practicing safe sex. Taking low-dose aspirin every day. Taking vitamin and mineral supplements as recommended by your health care provider. What happens during an annual well check? The services and screenings done by your health care provider during your annual well check will depend on your age, overall health, lifestyle risk factors, and family history of disease. Counseling  Your health care provider may ask you questions about your: Alcohol use. Tobacco use. Drug use. Emotional well-being. Home and relationship well-being. Sexual  activity. Eating habits. History of falls. Memory and ability to understand (cognition). Work and work Statistician. Reproductive health. Screening  You may have the following tests or measurements: Height, weight, and BMI. Blood pressure. Lipid and cholesterol levels. These may be checked every 5 years, or more frequently if you are over 38 years old. Skin check. Lung cancer screening. You may have this screening every year starting at age 53 if you have a 30-pack-year history of smoking and currently smoke or have quit within the past 15 years. Fecal occult blood test (FOBT) of the stool. You may have this test every year starting at age 104. Flexible sigmoidoscopy or colonoscopy. You may have a sigmoidoscopy every 5 years or a colonoscopy every 10 years starting at age 49. Hepatitis C blood test. Hepatitis B blood test. Sexually transmitted disease (STD) testing. Diabetes screening. This is done by checking your blood sugar (glucose) after you have not eaten for a while (fasting). You may have this done every 1-3 years. Bone density scan. This is done to screen for osteoporosis. You may have this done starting at age 47. Mammogram. This may be done every 1-2 years. Talk to your health care provider about how often you should have regular mammograms. Talk with your health care provider about your test results, treatment options, and if necessary, the need for more tests. Vaccines  Your health care provider may recommend certain vaccines, such as: Influenza vaccine. This is recommended every year. Tetanus, diphtheria, and acellular pertussis (Tdap, Td) vaccine. You may need a Td booster every 10 years. Zoster vaccine. You may need this after age 77. Pneumococcal 13-valent conjugate (PCV13) vaccine. One dose is recommended after age 55. Pneumococcal polysaccharide (PPSV23) vaccine. One dose is recommended  after age 43. Talk to your health care provider about which screenings and vaccines  you need and how often you need them. This information is not intended to replace advice given to you by your health care provider. Make sure you discuss any questions you have with your health care provider. Document Released: 03/19/2015 Document Revised: 11/10/2015 Document Reviewed: 12/22/2014 Elsevier Interactive Patient Education  2017 [place] Prevention in the Home Falls can cause injuries. They can happen to people of all ages. There are many things you can do to make your home safe and to help prevent falls. What can I do on the outside of my home? Regularly fix the edges of walkways and driveways and fix any cracks. Remove anything that might make you trip as you walk through a door, such as a raised step or threshold. Trim any bushes or trees on the path to your home. Use bright outdoor lighting. Clear any walking paths of anything that might make someone trip, such as rocks or tools. Regularly check to see if handrails are loose or broken. Make sure that both sides of any steps have handrails. Any raised decks and porches should have guardrails on the edges. Have any leaves, snow, or ice cleared regularly. Use sand or salt on walking paths during winter. Clean up any spills in your garage right away. This includes oil or grease spills. What can I do in the bathroom? Use night lights. Install grab bars by the toilet and in the tub and shower. Do not use towel bars as grab bars. Use non-skid mats or decals in the tub or shower. If you need to sit down in the shower, use a plastic, non-slip stool. Keep the floor dry. Clean up any water that spills on the floor as soon as it happens. Remove soap buildup in the tub or shower regularly. Attach bath mats securely with double-sided non-slip rug tape. Do not have throw rugs and other things on the floor that can make you trip. What can I do in the bedroom? Use night lights. Make sure that you have a light by your bed that  is easy to reach. Do not use any sheets or blankets that are too big for your bed. They should not hang down onto the floor. Have a firm chair that has side arms. You can use this for support while you get dressed. Do not have throw rugs and other things on the floor that can make you trip. What can I do in the kitchen? Clean up any spills right away. Avoid walking on wet floors. Keep items that you use a lot in easy-to-reach places. If you need to reach something above you, use a strong step stool that has a grab bar. Keep electrical cords out of the way. Do not use floor polish or wax that makes floors slippery. If you must use wax, use non-skid floor wax. Do not have throw rugs and other things on the floor that can make you trip. What can I do with my stairs? Do not leave any items on the stairs. Make sure that there are handrails on both sides of the stairs and use them. Fix handrails that are broken or loose. Make sure that handrails are as long as the stairways. Check any carpeting to make sure that it is firmly attached to the stairs. Fix any carpet that is loose or worn. Avoid having throw rugs at the top or bottom of the stairs. If you  do have throw rugs, attach them to the floor with carpet tape. Make sure that you have a light switch at the top of the stairs and the bottom of the stairs. If you do not have them, ask someone to add them for you. What else can I do to help prevent falls? Wear shoes that: Do not have high heels. Have rubber bottoms. Are comfortable and fit you well. Are closed at the toe. Do not wear sandals. If you use a stepladder: Make sure that it is fully opened. Do not climb a closed stepladder. Make sure that both sides of the stepladder are locked into place. Ask someone to hold it for you, if possible. Clearly mark and make sure that you can see: Any grab bars or handrails. First and last steps. Where the edge of each step is. Use tools that help you  move around (mobility aids) if they are needed. These include: Canes. Walkers. Scooters. Crutches. Turn on the lights when you go into a dark area. Replace any light bulbs as soon as they burn out. Set up your furniture so you have a clear path. Avoid moving your furniture around. If any of your floors are uneven, fix them. If there are any pets around you, be aware of where they are. Review your medicines with your doctor. Some medicines can make you feel dizzy. This can increase your chance of falling. Ask your doctor what other things that you can do to help prevent falls. This information is not intended to replace advice given to you by your health care provider. Make sure you discuss any questions you have with your health care provider. Document Released: 12/17/2008 Document Revised: 07/29/2015 Document Reviewed: 03/27/2014 Elsevier Interactive Patient Education  2017 [employer].

## 2022-04-09 LAB — CUP PACEART REMOTE DEVICE CHECK
Date Time Interrogation Session: 20240203230454
Implantable Pulse Generator Implant Date: 20221111

## 2022-04-10 ENCOUNTER — Ambulatory Visit: Payer: Medicare Other

## 2022-04-10 DIAGNOSIS — I480 Paroxysmal atrial fibrillation: Secondary | ICD-10-CM | POA: Diagnosis not present

## 2022-04-12 DIAGNOSIS — R053 Chronic cough: Secondary | ICD-10-CM | POA: Diagnosis not present

## 2022-04-13 DIAGNOSIS — H2513 Age-related nuclear cataract, bilateral: Secondary | ICD-10-CM | POA: Diagnosis not present

## 2022-04-13 DIAGNOSIS — H53143 Visual discomfort, bilateral: Secondary | ICD-10-CM | POA: Diagnosis not present

## 2022-04-13 DIAGNOSIS — H3540 Unspecified peripheral retinal degeneration: Secondary | ICD-10-CM | POA: Diagnosis not present

## 2022-04-14 DIAGNOSIS — M5431 Sciatica, right side: Secondary | ICD-10-CM | POA: Diagnosis not present

## 2022-04-14 DIAGNOSIS — M542 Cervicalgia: Secondary | ICD-10-CM | POA: Diagnosis not present

## 2022-04-14 DIAGNOSIS — M545 Low back pain, unspecified: Secondary | ICD-10-CM | POA: Diagnosis not present

## 2022-04-14 DIAGNOSIS — M25511 Pain in right shoulder: Secondary | ICD-10-CM | POA: Diagnosis not present

## 2022-04-15 NOTE — Progress Notes (Unsigned)
Cardiology Office Note:    Date:  04/19/2022   ID:  Alicia [name], DOB 05-10-1945, MRN [id number]  PCP:  Alicia Chandler, NP   [facility] Providers Cardiologist:  None Electrophysiologist:  [name], Alicia [name] {  Referring Alicia [name]: Alicia Chandler, NP    History of Present Illness:    Alicia [name] is a 77 y.o. female with a hx of paroxysmal Afib s/p ablation, PVCs, and peripheral neuropathy who is followed by Dr. Quentin Ore who is now referred to General Cardiology for further management.  Per review of the record, the patient has history of paroxysmal Afib and underwent successful ablation in 06/2020 with no recurrence of Afib on loop monitor. She has been weaned off metop and remains on xarelto.  Today, the patient states she has several questions she would like to address. She was seen in the ER in 04/01/22 for elevated blood pressures, which is a new issue for her. Symptoms began suddenly and her BP went as high as 200/100 prompting her to go to the ER. In the ER, work-up was reassuring and BP improved. She was started on amlodipine. Since starting the amlodipine, her blood pressure has been fluctuating and has been anywhere from 110-140s. States she has been very concerned as she typically has low blood pressure. HR has also increased to 80s which is higher than usual. Notably, she is off her metop as it made her feel fatigued. She is very concerned about her pressures and we spent a long time discussing possible causes and treatments.   No chest pain, orthopnea, PND, or LE edema. Had palpitations at time of severe hypertension episode but no significant episodes since that time.   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   Chronic migraine without aura, intractable, without status migrainosus    Diverticulosis 2010   Endometriosis    Low sodium levels    Malnutrition (HCC)    Alicia [name] (muscular dystrophy) ([place])     Meniere's disease, bilateral    [facility]   Peripheral neuropathy    Dr Erling Cruz   Pneumonia    Sensorineural hearing loss, bilateral    Vertigo 2019    Past Surgical History:  Procedure Laterality Date   ABDOMINAL HYSTERECTOMY  1980 or 1981   for Endometriosis   APPENDECTOMY  1965   high school; low grade appendiceal cancer   ATRIAL FIBRILLATION ABLATION N/A 06/17/2020   Procedure: ATRIAL FIBRILLATION ABLATION;  Surgeon: Thompson Grayer, Alicia [name];  Location: [facility];  Service: Cardiovascular;  Laterality: N/A;   BREAST ENHANCEMENT SURGERY Bilateral 1979   COLONOSCOPY  01/2012   Tics; Dr Teena Irani   ESI      X 3 in 2003 & 02/17/2011 for L4-S1 symptoms   implantable loop recorder placement  01/14/2021   Medtronic Reveal Linq model LNQ 22 ([place] [id number]) implantable loop recorder   LUMBAR LAMINECTOMY  12/2012   [facility] , [place]   TONSILLECTOMY  1950 or 1951   TUBAL LIGATION     vocal cords stripped  1970    Current Medications: Current Meds  Medication Sig   acetaminophen (TYLENOL) 500 MG tablet Take 500 mg by mouth at bedtime as needed for moderate pain.   amLODipine (NORVASC) 5 MG tablet Take 1 tablet (5 mg total) by mouth daily.   baclofen (LIORESAL) 10 MG tablet Take 10 mg by  mouth at bedtime.   butalbital-acetaminophen-caffeine (FIORICET) 50-325-40 MG tablet Take 1 tablet by mouth every 6 (six) hours as needed for migraine.   Calcium Citrate-Vitamin D (CALCIUM + D PO) Take 1 tablet by mouth as directed. 3-4 times daily   Carboxymethylcellulose Sodium (THERATEARS) 0.25 % SOLN Place 1 drop into both eyes 4 (four) times daily.   chlorpheniramine (CHLOR-TRIMETON) 4 MG tablet Take 4-8 mg by mouth at bedtime.   Cholecalciferol (VITAMIN D3) LIQD Take 1,000 Units by mouth daily.   Evening Primrose Oil 1000 MG CAPS Take 1,000 mg by mouth every evening.   famotidine (PEPCID) 20 MG tablet One after supper   FEMRING 0.05 MG/24HR RING Place 1 each vaginally every 3  (three) months.   hydrALAZINE (APRESOLINE) 25 MG tablet Take 1 tablet (25 mg total) by mouth 3 (three) times daily as needed (for systolic blood pressure greater than 150).   Lutein 20 MG TABS Take 20 mg by mouth daily.    MAGNESIUM CITRATE PO Take 300 mg by mouth daily.   meclizine (ANTIVERT) 12.5 MG tablet TAKE 1 TO 2 TABLETS(12.5 TO 25 MG) BY MOUTH THREE TIMES DAILY AS NEEDED FOR DIZZINESS   methocarbamol (ROBAXIN) 750 MG tablet Take 750 mg by mouth every 6 (six) hours.   Multiple Vitamin (MULTIVITAMIN WITH MINERALS) TABS tablet Take 1 tablet by mouth daily.   Multiple Vitamins-Minerals (PRESERVISION AREDS 2) CAPS Take 1 capsule by mouth 2 (two) times daily.   Omega-3 1000 MG CAPS Take 1,000 mg by mouth 2 (two) times daily.   ondansetron (ZOFRAN-ODT) 4 MG disintegrating tablet DISSOLVE 1 TABLET(4 MG) ON THE TONGUE EVERY 6 HOURS AS NEEDED FOR NAUSEA OR VOMITING   pantoprazole (PROTONIX) 40 MG tablet Take 1 tablet (40 mg total) by mouth daily. Take 30-60 min before first meal of the day   sodium chloride 1 g tablet Take 1 g by mouth 4 (four) times daily.   UBRELVY 100 MG TABS Take 1-2 tablets by mouth 2 (two) times daily as needed.   XARELTO 20 MG TABS tablet TAKE 1 TABLET DAILY WITH SUPPER     Allergies:   Biaxin [clarithromycin], Sulfonamide derivatives, Tequin [gatifloxacin], Demerol, Lactose intolerance (gi), Nsaids, Risedronate sodium, Rofecoxib, Alendronate sodium, Aspirin, Cardizem [diltiazem hcl], Eliquis [apixaban], Fish allergy, Benadryl [diphenhydramine hcl], and Other   Social History   Socioeconomic History   Marital status: Married    Spouse name: Not on file   Number of children: Not on file   Years of education: Not on file   Highest education level: Not on file  Occupational History   Not on file  Tobacco Use   Smoking status: Never   Smokeless tobacco: Never  Vaping Use   Vaping Use: Never used  Substance and Sexual Activity   Alcohol use: Not Currently   Drug  use: Never   Sexual activity: Not Currently  Other Topics Concern   Not on file  Social History Narrative   Social History      Diet? Trying to gain weight      Do you drink/eat things with caffeine? 1 mug hot tea with breakfast per day      Marital status?  Married 26 years                                  What year were you married? 1970      Do you live in a  house, apartment, assisted living, condo, trailer, etc.? House (46 years)      Is it one or more stories? 2 stories and walk up finished attic      How many persons live in your home? 2      Do you have any pets in your home? (please list) none      Highest level of education completed? 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     yes                                 Type & how often? Walk in neighborhood 4 - 6 times per week if possible      Advanced Directives (Lawyer just beginning to work on these things)      Do you have a living will?      Do you have a DNR form?                    no              If not, do you want to discuss one?      Do you have signed POA/HPOA for forms?       Functional Status      Do you have difficulty bathing or dressing yourself? no      Do you have difficulty preparing food or eating? no      Do you have difficulty managing your medications? no      Do you have difficulty managing your finances? no      Do you have difficulty affording your medications? no   Social Determinants of Health   Financial Resource Strain: Not on file  Food Insecurity: Not on file  Transportation Needs: Not on file  Physical Activity: Not on file  Stress: Not on file  Social Connections: Not on file     Family History: The patient's family history includes Alcohol abuse in her mother; Alzheimer's disease (age of onset: 41) in her father; Arthritis (age of onset: 49) in her sister;  Cancer (age of onset: 34) in her maternal aunt; Dementia in her mother; Diabetes (age of onset: 62) in her mother; Hyperlipidemia in her sister; Hyperlipidemia (age of onset: 37) in her sister; Hypertension in her mother; Kidney failure in her mother; Lung disease in her maternal grandfather and maternal grandmother; Osteoporosis in her mother; Other in her daughter and son; Stroke (age of onset: 76) in her mother. There is no history of Heart disease or Heart attack.  ROS:   Please see the history of present illness.     All other systems reviewed and are negative.  EKGs/Labs/Other Studies Reviewed:    The following studies were reviewed today: Ca score 06/2020: IMPRESSION: 1. There is normal pulmonary vein drainage into the left atrium with ostial measurements above.   2. There is no thrombus in the left atrial appendage.   3. The esophagus runs in the left atrial midline and is not in proximity to any of the pulmonary vein ostia.   4. There is a small PFO.   5. Normal coronary origin. Right dominance.   6. CAC score of 0.  EKG:  EKG is not ordered today.    Recent Labs: 07/15/2021: ALT 13 10/06/2021: TSH  3.540 04/01/2022: BUN 21; Creatinine, Ser 0.63; Hemoglobin 12.7; Magnesium 2.7; Platelets 242; Potassium 4.2; Sodium 128  Recent Lipid Panel    Component Value Date/Time   CHOL 192 03/19/2018 1005   TRIG 52 03/19/2018 1005   TRIG 46 01/29/2006 1132   HDL 88 03/19/2018 1005   CHOLHDL 2.2 03/19/2018 1005   VLDL 9.4 08/20/2012 1032   LDLCALC 91 03/19/2018 1005   LDLDIRECT 124.7 08/20/2012 1032     Risk Assessment/Calculations:    CHA2DS2-VASc Score = 4   This indicates a 4.8% annual risk of stroke. The patient's score is based upon: CHF History: 0 HTN History: 1 Diabetes History: 0 Stroke History: 0 Vascular Disease History: 0 Age Score: 2 Gender Score: 1         Physical Exam:    VS:  BP (!) 148/84   Pulse 81   Ht 5' 6"$  (1.676 m)   Wt 109 lb 6.4 oz (49.6  kg)   SpO2 98%   BMI 17.66 kg/m     Wt Readings from Last 3 Encounters:  04/19/22 109 lb 6.4 oz (49.6 kg)  04/07/22 114 lb (51.7 kg)  04/03/22 113 lb 12.8 oz (51.6 kg)     GEN:  Well nourished, well developed in no acute distress HEENT: Normal NECK: No JVD; No carotid bruits CARDIAC: RRR, no murmurs, rubs, gallops RESPIRATORY:  Clear to auscultation without rales, wheezing or rhonchi  ABDOMEN: Soft, non-tender, non-distended MUSCULOSKELETAL:  No edema; No deformity  SKIN: Warm and dry NEUROLOGIC:  Alert and oriented x 3 PSYCHIATRIC:  Normal affect   ASSESSMENT:    1. Paroxysmal atrial fibrillation (HCC)   2. Atypical atrial flutter (HCC)   3. Typical atrial flutter ([place])   4. Atrial fibrillation, unspecified type ([place])   5. Hypertension, unspecified type   6. Medication management   7. Secondary hypercoagulable state ([place])   8. Chronic hyponatremia    PLAN:    In order of problems listed above:  #Paroxysmal Afib: CHADs-vasc 4. S/p ablation in 06/2020 with no recurrence of Afib on loop recorder. Currently doing well. Off metop due to dizziness. -Continue xarelto 44m daily -Follow-up with Dr. LQuentin Oreas scheduled  #PVCs: Off metoprolol and symptoms overall controlled.   #HTN: Very concerned about her recent BP elevation. On average, seems like she is responding well to amlodipine with BP in office mainly 110-130. Given that she does have readings above 140 at home with associated symptoms, will give hydralazine as needed as patient very concerned about driving pressure too low as well. Will also check TSH to ensure within normal range. Suspect some of her elevated blood pressures are due to increase need for sodium in the setting of hyponatremia. -Continue amlodipine 562mdaily -Start hydralazine 2519mID prn for SBP>150  #Hyponatremia: Chronic issue for her and closely monitored by PCP. Currently on salt supplementation.      Medication Adjustments/Labs and Tests  Ordered: Current medicines are reviewed at length with the patient today.  Concerns regarding medicines are outlined above.  Orders Placed This Encounter  Procedures   TSH   Meds ordered this encounter  Medications   hydrALAZINE (APRESOLINE) 25 MG tablet    Sig: Take 1 tablet (25 mg total) by mouth 3 (three) times daily as needed (for systolic blood pressure greater than 150).    Dispense:  90 tablet    Refill:  3    Patient Instructions  Medication Instructions:   START TAKING HYDRALAZINE 25 MG BY MOUTH  THREE TIMES DAILY AS NEEDED FOR SYSTOLIC BLOOD PRESSURE  (TOP NUMBER) 150 OR GREATER  *If you need a refill on your cardiac medications before your next appointment, please call your pharmacy*   Lab Work:  TODAY--TSH  If you have labs (blood work) drawn today and your tests are completely normal, you will receive your results only by: [place] (if you have MyChart) OR A paper copy in the mail If you have any lab test that is abnormal or we need to change your treatment, we will call you to review the results.   Follow-Up:  3 MONTHS WITH AN EXTENDER IN THE OFFICE--PLEASE SCHEDULE WITH AN EXTENDER PER DR. Johney Frame     Signed, Alicia Bergeron, Alicia [name]  04/19/2022 5:11 PM    [place]

## 2022-04-17 DIAGNOSIS — Z03818 Encounter for observation for suspected exposure to other biological agents ruled out: Secondary | ICD-10-CM | POA: Diagnosis not present

## 2022-04-17 DIAGNOSIS — H04123 Dry eye syndrome of bilateral lacrimal glands: Secondary | ICD-10-CM | POA: Diagnosis not present

## 2022-04-17 DIAGNOSIS — R52 Pain, unspecified: Secondary | ICD-10-CM | POA: Diagnosis not present

## 2022-04-17 DIAGNOSIS — B349 Viral infection, unspecified: Secondary | ICD-10-CM | POA: Diagnosis not present

## 2022-04-17 DIAGNOSIS — R051 Acute cough: Secondary | ICD-10-CM | POA: Diagnosis not present

## 2022-04-17 DIAGNOSIS — R6883 Chills (without fever): Secondary | ICD-10-CM | POA: Diagnosis not present

## 2022-04-19 ENCOUNTER — Ambulatory Visit: Payer: Medicare Other | Attending: Cardiology | Admitting: Cardiology

## 2022-04-19 ENCOUNTER — Encounter: Payer: Self-pay | Admitting: Cardiology

## 2022-04-19 VITALS — BP 148/84 | HR 81 | Ht 66.0 in | Wt 109.4 lb

## 2022-04-19 DIAGNOSIS — I483 Typical atrial flutter: Secondary | ICD-10-CM | POA: Diagnosis not present

## 2022-04-19 DIAGNOSIS — I10 Essential (primary) hypertension: Secondary | ICD-10-CM | POA: Insufficient documentation

## 2022-04-19 DIAGNOSIS — Z79899 Other long term (current) drug therapy: Secondary | ICD-10-CM | POA: Insufficient documentation

## 2022-04-19 DIAGNOSIS — I484 Atypical atrial flutter: Secondary | ICD-10-CM | POA: Diagnosis not present

## 2022-04-19 DIAGNOSIS — I4891 Unspecified atrial fibrillation: Secondary | ICD-10-CM | POA: Diagnosis not present

## 2022-04-19 DIAGNOSIS — D6869 Other thrombophilia: Secondary | ICD-10-CM | POA: Diagnosis not present

## 2022-04-19 DIAGNOSIS — E871 Hypo-osmolality and hyponatremia: Secondary | ICD-10-CM | POA: Insufficient documentation

## 2022-04-19 DIAGNOSIS — I480 Paroxysmal atrial fibrillation: Secondary | ICD-10-CM | POA: Insufficient documentation

## 2022-04-19 MED ORDER — HYDRALAZINE HCL 25 MG PO TABS: 25.0000 mg | ORAL_TABLET | Freq: Three times a day (TID) | ORAL | 3 refills | Status: DC | PRN

## 2022-04-19 NOTE — Patient Instructions (Signed)
Medication Instructions:   START TAKING HYDRALAZINE 25 MG BY MOUTH THREE TIMES DAILY AS NEEDED FOR SYSTOLIC BLOOD PRESSURE  (TOP NUMBER) 150 OR GREATER  *If you need a refill on your cardiac medications before your next appointment, please call your pharmacy*   Lab Work:  TODAY--TSH  If you have labs (blood work) drawn today and your tests are completely normal, you will receive your results only by: [place] (if you have MyChart) OR A paper copy in the mail If you have any lab test that is abnormal or we need to change your treatment, we will call you to review the results.   Follow-Up:  3 MONTHS WITH AN EXTENDER IN THE OFFICE--PLEASE SCHEDULE WITH AN EXTENDER PER DR. Johney Frame

## 2022-04-20 ENCOUNTER — Telehealth: Payer: Self-pay | Admitting: *Deleted

## 2022-04-20 ENCOUNTER — Encounter: Payer: Self-pay | Admitting: Cardiology

## 2022-04-20 LAB — TSH: TSH: 2.83 u[IU]/mL (ref 0.450–4.500)

## 2022-04-20 MED ORDER — AMLODIPINE BESYLATE 5 MG PO TABS: 5.0000 mg | ORAL_TABLET | Freq: Every day | ORAL | 1 refills | Status: DC

## 2022-04-20 NOTE — Telephone Encounter (Signed)
-----   Message from Freada Bergeron, MD sent at 04/20/2022 12:41 PM EST ----- TSH is normal.

## 2022-04-20 NOTE — Telephone Encounter (Signed)
Pt called to ask for a refill of amlodipine for she forgot to ask Dr. Johney Frame for this yesterday at the office visit.  Refill request sent into the pts confirmed pharmacy of choice.  Pt verbalized understanding and was gracious for all the assistance provided.

## 2022-04-21 DIAGNOSIS — M25511 Pain in right shoulder: Secondary | ICD-10-CM | POA: Diagnosis not present

## 2022-04-21 DIAGNOSIS — M545 Low back pain, unspecified: Secondary | ICD-10-CM | POA: Diagnosis not present

## 2022-04-21 DIAGNOSIS — M542 Cervicalgia: Secondary | ICD-10-CM | POA: Diagnosis not present

## 2022-04-21 DIAGNOSIS — M5431 Sciatica, right side: Secondary | ICD-10-CM | POA: Diagnosis not present

## 2022-04-24 ENCOUNTER — Encounter: Payer: Self-pay | Admitting: Nurse Practitioner

## 2022-04-25 DIAGNOSIS — M5431 Sciatica, right side: Secondary | ICD-10-CM | POA: Diagnosis not present

## 2022-04-25 DIAGNOSIS — M542 Cervicalgia: Secondary | ICD-10-CM | POA: Diagnosis not present

## 2022-04-25 DIAGNOSIS — M545 Low back pain, unspecified: Secondary | ICD-10-CM | POA: Diagnosis not present

## 2022-04-25 DIAGNOSIS — M25511 Pain in right shoulder: Secondary | ICD-10-CM | POA: Diagnosis not present

## 2022-04-28 DIAGNOSIS — M25511 Pain in right shoulder: Secondary | ICD-10-CM | POA: Diagnosis not present

## 2022-04-28 DIAGNOSIS — M5431 Sciatica, right side: Secondary | ICD-10-CM | POA: Diagnosis not present

## 2022-04-28 DIAGNOSIS — M545 Low back pain, unspecified: Secondary | ICD-10-CM | POA: Diagnosis not present

## 2022-04-28 DIAGNOSIS — M542 Cervicalgia: Secondary | ICD-10-CM | POA: Diagnosis not present

## 2022-05-02 DIAGNOSIS — M25511 Pain in right shoulder: Secondary | ICD-10-CM | POA: Diagnosis not present

## 2022-05-02 DIAGNOSIS — M5431 Sciatica, right side: Secondary | ICD-10-CM | POA: Diagnosis not present

## 2022-05-02 DIAGNOSIS — M545 Low back pain, unspecified: Secondary | ICD-10-CM | POA: Diagnosis not present

## 2022-05-02 DIAGNOSIS — M542 Cervicalgia: Secondary | ICD-10-CM | POA: Diagnosis not present

## 2022-05-02 MED ORDER — PANTOPRAZOLE SODIUM 40 MG PO TBEC: 40.0000 mg | DELAYED_RELEASE_TABLET | Freq: Every day | ORAL | 1 refills | Status: DC

## 2022-05-12 ENCOUNTER — Ambulatory Visit: Payer: Medicare Other

## 2022-05-12 DIAGNOSIS — M542 Cervicalgia: Secondary | ICD-10-CM | POA: Diagnosis not present

## 2022-05-12 DIAGNOSIS — I480 Paroxysmal atrial fibrillation: Secondary | ICD-10-CM

## 2022-05-12 DIAGNOSIS — M25511 Pain in right shoulder: Secondary | ICD-10-CM | POA: Diagnosis not present

## 2022-05-12 DIAGNOSIS — M545 Low back pain, unspecified: Secondary | ICD-10-CM | POA: Diagnosis not present

## 2022-05-12 DIAGNOSIS — M5431 Sciatica, right side: Secondary | ICD-10-CM | POA: Diagnosis not present

## 2022-05-12 LAB — CUP PACEART REMOTE DEVICE CHECK
Date Time Interrogation Session: 20240307230400
Implantable Pulse Generator Implant Date: 20221111

## 2022-05-16 IMAGING — CT CT ANGIO CHEST
2 of 6 series · 19 of 36 positions shown · IV contrast (omnipaque)
Comparison: None.

CLINICAL DATA: Shortness of breath, chest heaviness.

EXAM:
CT ANGIOGRAPHY CHEST WITH CONTRAST
TECHNIQUE: Multidetector CT imaging of the chest was performed using the
standard protocol during bolus administration of intravenous
contrast. Multiplanar CT image reconstructions and MIPs were
obtained to evaluate the vascular anatomy.
CONTRAST:  60mL OMNIPAQUE IOHEXOL 350 MG/ML SOLN

[Series 7: pe thins · axial · 0.59mm/px · z∈[-325,-85]mm · 18 of 380 slices shown]
[im 19/380  lung]
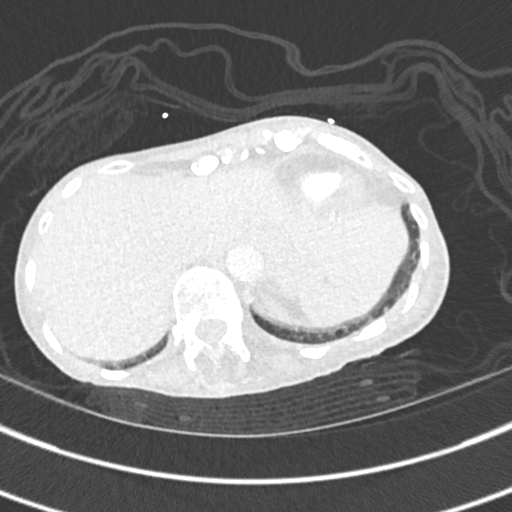
[im 38/380  mediastinal]
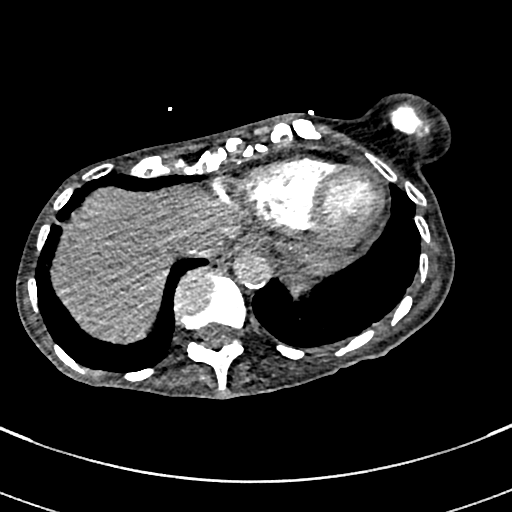
[im 57/380  lung]
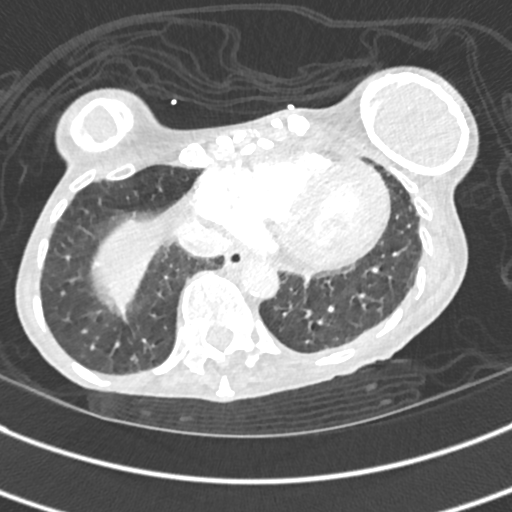
[im 76/380  mediastinal]
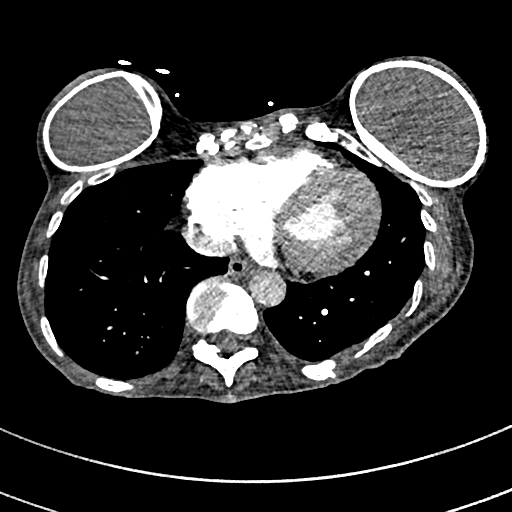
[im 95/380  lung]
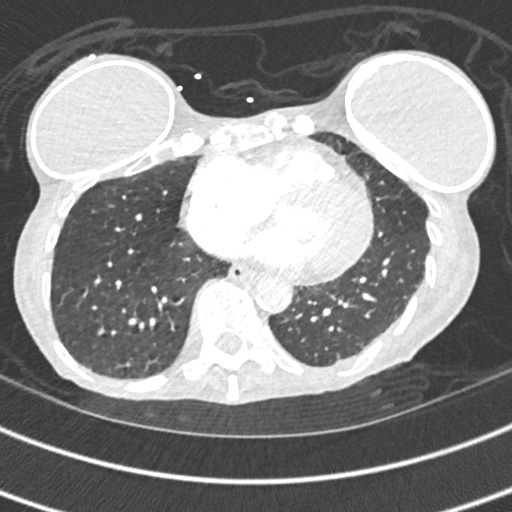
[im 114/380  mediastinal]
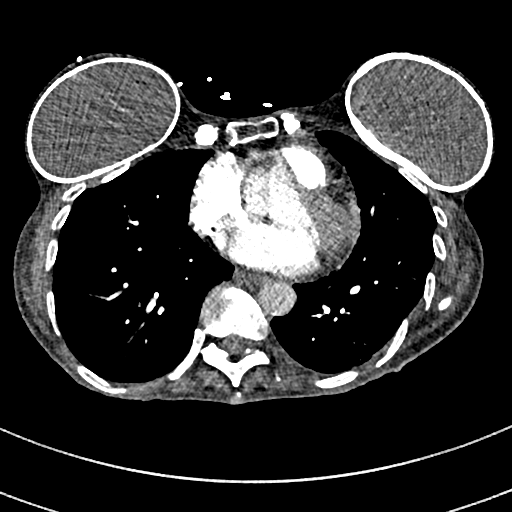
[im 133/380  lung]
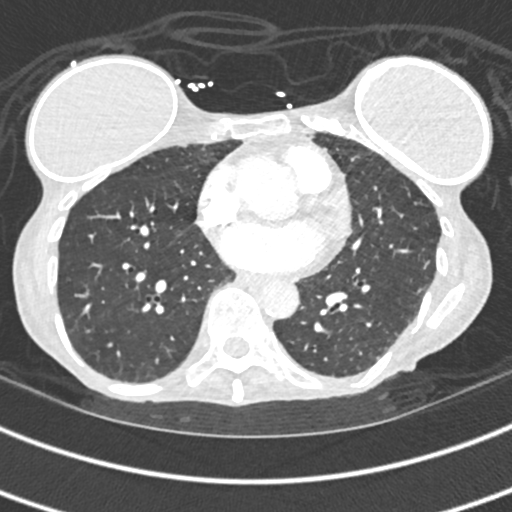
[im 152/380  mediastinal]
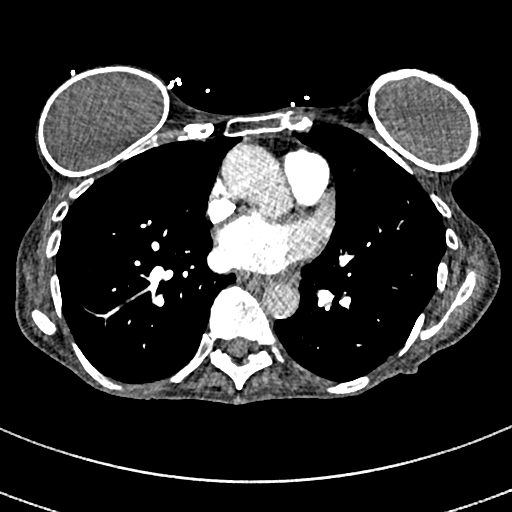
[im 171/380  lung]
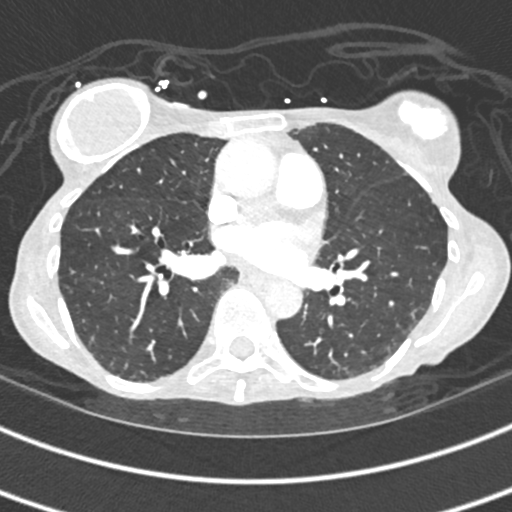
[im 209/380  mediastinal]
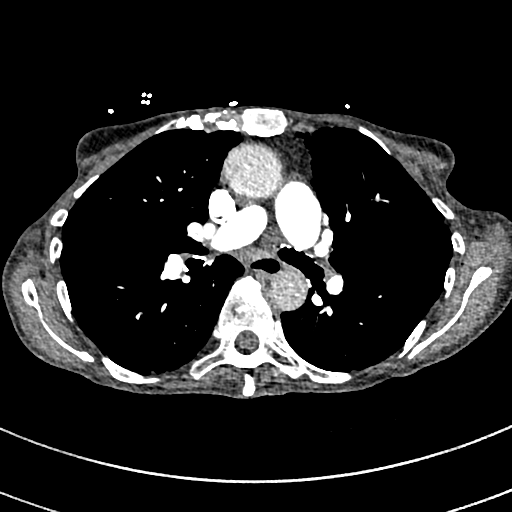
[im 228/380  lung]
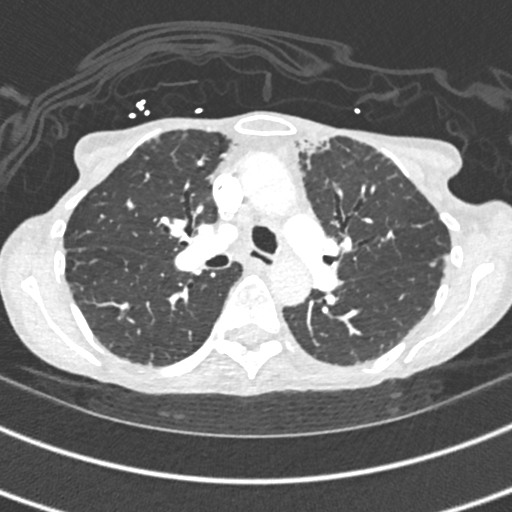
[im 247/380  mediastinal]
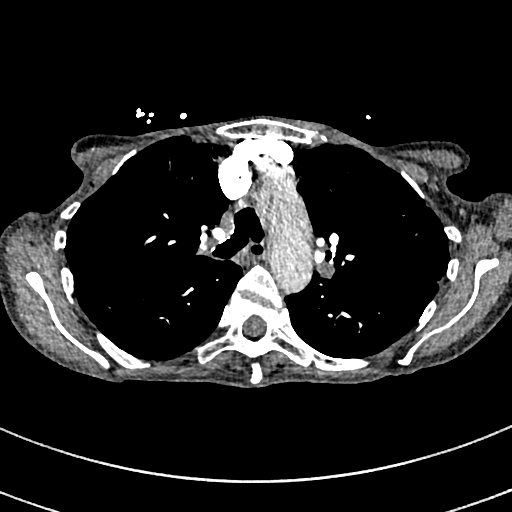
[im 266/380  lung]
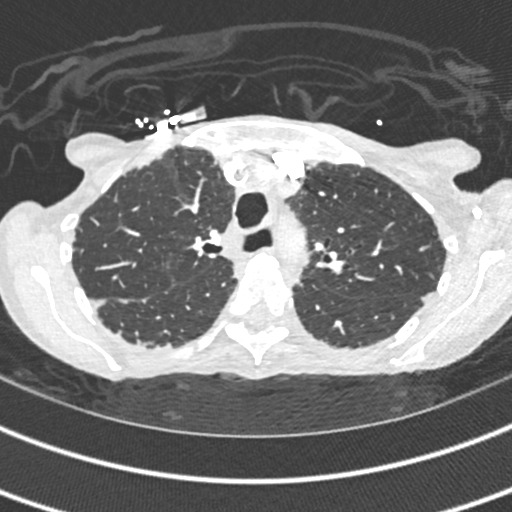
[im 285/380  mediastinal]
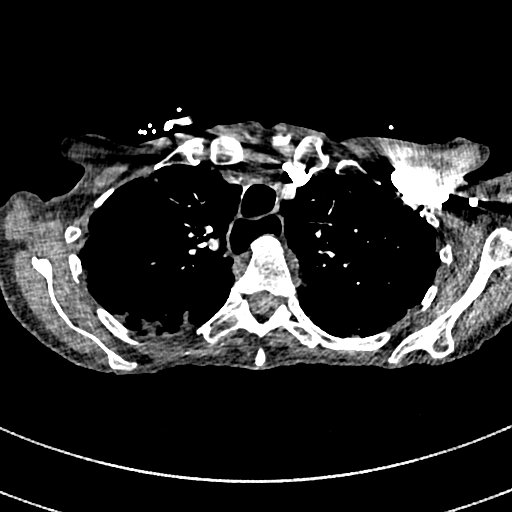
[im 304/380  lung]
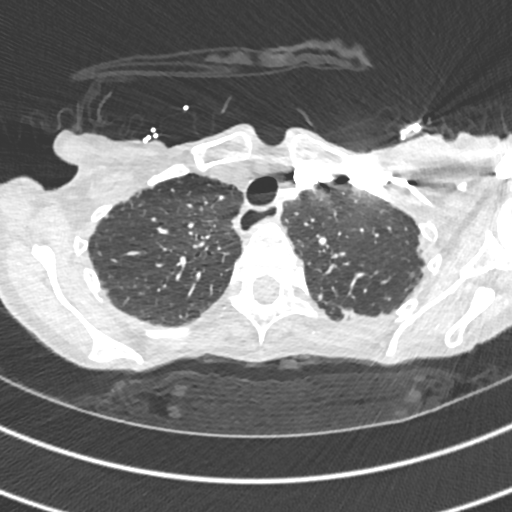
[im 323/380  mediastinal]
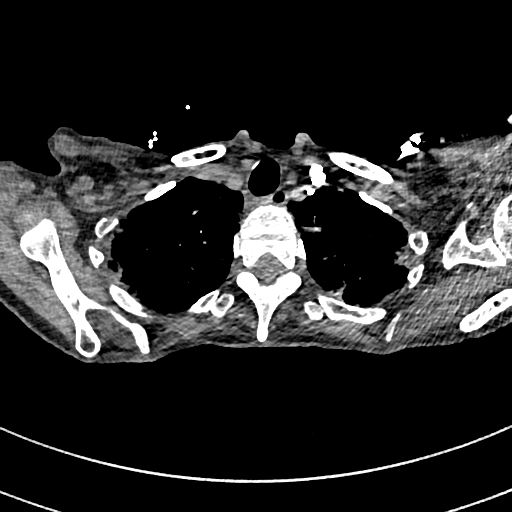
[im 342/380  lung]
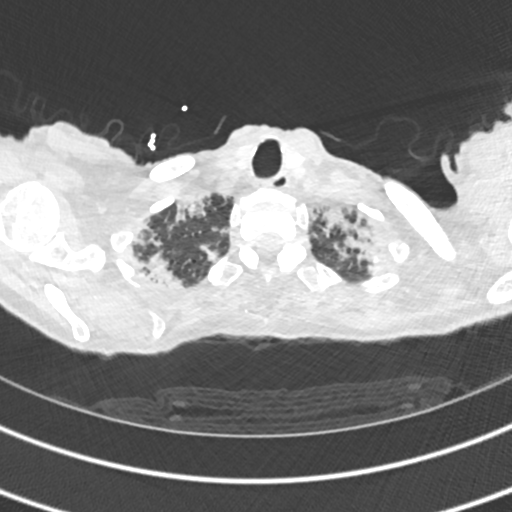
[im 361/380  mediastinal]
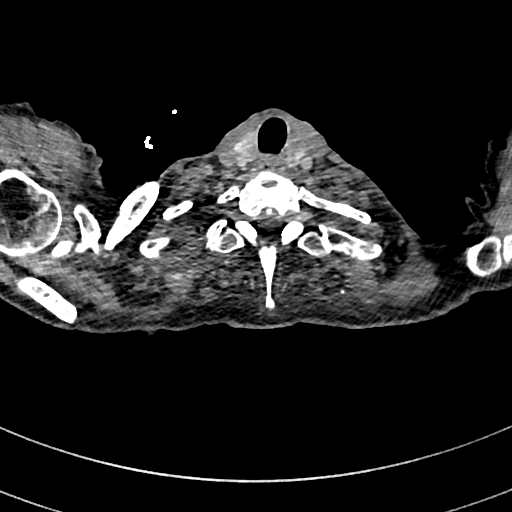

[Series 8: pe 2mm cor · coronal · 0.53mm/px · 1 of 141 slices shown]
[im 71/141  mediastinal]
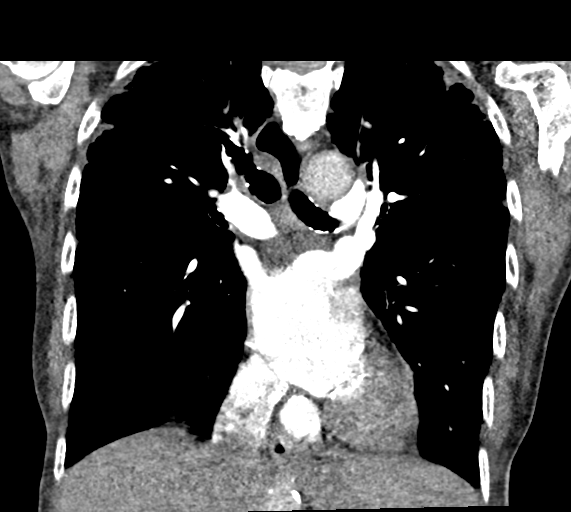

[19 of 36 positions shown; findings below may reference images not displayed]

FINDINGS: Cardiovascular: Satisfactory opacification of the pulmonary arteries
to the segmental level. No evidence of pulmonary embolism. Normal
heart size. No pericardial effusion. Atherosclerosis of thoracic
aorta is noted without aneurysm formation.

Mediastinum/Nodes: No enlarged mediastinal, hilar, or axillary lymph
nodes. Thyroid gland, trachea, and esophagus demonstrate no
significant findings.

Lungs/Pleura: No pneumothorax or pleural effusion is noted. Mild to
moderate bilateral apical scarring is noted.

Upper Abdomen: No acute abnormality.

Musculoskeletal: No chest wall abnormality. No acute or significant
osseous findings.

Review of the MIP images confirms the above findings.
IMPRESSION: 1. No definite evidence of pulmonary embolus.
2. Mild to moderate bilateral apical scarring is noted.
3. Aortic atherosclerosis.

Aortic Atherosclerosis ([id number]).

## 2022-05-19 DIAGNOSIS — M542 Cervicalgia: Secondary | ICD-10-CM | POA: Diagnosis not present

## 2022-05-19 DIAGNOSIS — M25511 Pain in right shoulder: Secondary | ICD-10-CM | POA: Diagnosis not present

## 2022-05-19 DIAGNOSIS — M5431 Sciatica, right side: Secondary | ICD-10-CM | POA: Diagnosis not present

## 2022-05-19 DIAGNOSIS — M545 Low back pain, unspecified: Secondary | ICD-10-CM | POA: Diagnosis not present

## 2022-05-23 ENCOUNTER — Other Ambulatory Visit: Payer: Self-pay | Admitting: Nurse Practitioner

## 2022-05-23 DIAGNOSIS — R42 Dizziness and giddiness: Secondary | ICD-10-CM

## 2022-05-23 DIAGNOSIS — M545 Low back pain, unspecified: Secondary | ICD-10-CM | POA: Diagnosis not present

## 2022-05-23 DIAGNOSIS — M542 Cervicalgia: Secondary | ICD-10-CM | POA: Diagnosis not present

## 2022-05-23 DIAGNOSIS — M25511 Pain in right shoulder: Secondary | ICD-10-CM | POA: Diagnosis not present

## 2022-05-23 DIAGNOSIS — M5431 Sciatica, right side: Secondary | ICD-10-CM | POA: Diagnosis not present

## 2022-05-24 NOTE — Telephone Encounter (Signed)
Patient medication Meclizine has High risk warnings. Medication pend and sent to PCP Dewaine Oats Carlos American, NP please advise.

## 2022-05-24 NOTE — Progress Notes (Signed)
Carelink Summary Report / Loop Recorder 

## 2022-05-30 DIAGNOSIS — M545 Low back pain, unspecified: Secondary | ICD-10-CM | POA: Diagnosis not present

## 2022-05-30 DIAGNOSIS — M25511 Pain in right shoulder: Secondary | ICD-10-CM | POA: Diagnosis not present

## 2022-05-30 DIAGNOSIS — M5431 Sciatica, right side: Secondary | ICD-10-CM | POA: Diagnosis not present

## 2022-05-30 DIAGNOSIS — M542 Cervicalgia: Secondary | ICD-10-CM | POA: Diagnosis not present

## 2022-06-09 NOTE — Progress Notes (Signed)
Carelink Summary Report / Loop Recorder 

## 2022-06-14 ENCOUNTER — Ambulatory Visit (INDEPENDENT_AMBULATORY_CARE_PROVIDER_SITE_OTHER): Payer: Medicare Other

## 2022-06-14 DIAGNOSIS — I484 Atypical atrial flutter: Secondary | ICD-10-CM

## 2022-06-14 LAB — CUP PACEART REMOTE DEVICE CHECK
Date Time Interrogation Session: 20240409230253
Implantable Pulse Generator Implant Date: 20221111

## 2022-06-20 DIAGNOSIS — M542 Cervicalgia: Secondary | ICD-10-CM | POA: Diagnosis not present

## 2022-06-20 DIAGNOSIS — M545 Low back pain, unspecified: Secondary | ICD-10-CM | POA: Diagnosis not present

## 2022-06-20 DIAGNOSIS — M5431 Sciatica, right side: Secondary | ICD-10-CM | POA: Diagnosis not present

## 2022-06-20 DIAGNOSIS — M25511 Pain in right shoulder: Secondary | ICD-10-CM | POA: Diagnosis not present

## 2022-07-13 ENCOUNTER — Other Ambulatory Visit: Payer: Self-pay

## 2022-07-13 ENCOUNTER — Other Ambulatory Visit: Payer: Self-pay | Admitting: Nurse Practitioner

## 2022-07-13 DIAGNOSIS — E871 Hypo-osmolality and hyponatremia: Secondary | ICD-10-CM

## 2022-07-17 ENCOUNTER — Other Ambulatory Visit: Payer: Medicare Other

## 2022-07-17 ENCOUNTER — Ambulatory Visit (INDEPENDENT_AMBULATORY_CARE_PROVIDER_SITE_OTHER): Payer: Medicare Other

## 2022-07-17 DIAGNOSIS — I480 Paroxysmal atrial fibrillation: Secondary | ICD-10-CM

## 2022-07-17 DIAGNOSIS — E871 Hypo-osmolality and hyponatremia: Secondary | ICD-10-CM | POA: Diagnosis not present

## 2022-07-17 LAB — CUP PACEART REMOTE DEVICE CHECK
Date Time Interrogation Session: 20240512230357
Implantable Pulse Generator Implant Date: 20221111

## 2022-07-18 LAB — BASIC METABOLIC PANEL WITH GFR
BUN/Creatinine Ratio: 46 (calc) — ABNORMAL HIGH (ref 6–22)
BUN: 27 mg/dL — ABNORMAL HIGH (ref 7–25)
CO2: 31 mmol/L (ref 20–32)
Calcium: 9.7 mg/dL (ref 8.6–10.4)
Chloride: 101 mmol/L (ref 98–110)
Creat: 0.59 mg/dL — ABNORMAL LOW (ref 0.60–1.00)
Glucose, Bld: 100 mg/dL — ABNORMAL HIGH (ref 65–99)
Potassium: 4.9 mmol/L (ref 3.5–5.3)
Sodium: 138 mmol/L (ref 135–146)
eGFR: 93 mL/min/{1.73_m2} (ref >=60)

## 2022-07-19 ENCOUNTER — Other Ambulatory Visit: Payer: Medicare Other

## 2022-07-19 ENCOUNTER — Encounter: Payer: Self-pay | Admitting: Nurse Practitioner

## 2022-07-19 ENCOUNTER — Ambulatory Visit (INDEPENDENT_AMBULATORY_CARE_PROVIDER_SITE_OTHER): Payer: Medicare Other | Admitting: Nurse Practitioner

## 2022-07-19 VITALS — BP 124/78 | HR 82 | Temp 97.1°F | Ht 66.0 in | Wt 114.0 lb

## 2022-07-19 DIAGNOSIS — M5431 Sciatica, right side: Secondary | ICD-10-CM | POA: Diagnosis not present

## 2022-07-19 DIAGNOSIS — M545 Low back pain, unspecified: Secondary | ICD-10-CM | POA: Diagnosis not present

## 2022-07-19 DIAGNOSIS — M79642 Pain in left hand: Secondary | ICD-10-CM | POA: Diagnosis not present

## 2022-07-19 DIAGNOSIS — R42 Dizziness and giddiness: Secondary | ICD-10-CM | POA: Diagnosis not present

## 2022-07-19 DIAGNOSIS — M48061 Spinal stenosis, lumbar region without neurogenic claudication: Secondary | ICD-10-CM | POA: Diagnosis not present

## 2022-07-19 DIAGNOSIS — I10 Essential (primary) hypertension: Secondary | ICD-10-CM

## 2022-07-19 DIAGNOSIS — M542 Cervicalgia: Secondary | ICD-10-CM | POA: Diagnosis not present

## 2022-07-19 DIAGNOSIS — K219 Gastro-esophageal reflux disease without esophagitis: Secondary | ICD-10-CM

## 2022-07-19 DIAGNOSIS — E871 Hypo-osmolality and hyponatremia: Secondary | ICD-10-CM

## 2022-07-19 DIAGNOSIS — G7100 Muscular dystrophy, unspecified: Secondary | ICD-10-CM | POA: Diagnosis not present

## 2022-07-19 DIAGNOSIS — M25511 Pain in right shoulder: Secondary | ICD-10-CM | POA: Diagnosis not present

## 2022-07-19 NOTE — Patient Instructions (Addendum)
Can use ice to left hand Icy/hot, bengay, Aspercreme with lidocaine   Low back pain- use heat three times daily for this- get xray of low back    Follow up in 6 weeks for lab

## 2022-07-19 NOTE — Progress Notes (Signed)
Careteam: Patient Care Team: [name], NP as PCP - General (Geriatric Medicine) Lanier Prude, MD as PCP - Electrophysiology (Cardiology) Celso Amy, PA-C as Consulting Physician (Physician Assistant) Arminda Resides, MD as Consulting Physician (Dermatology) Osborn Coho, MD as Consulting Physician (Obstetrics and Gynecology) Ermalinda Barrios, MD as Consulting Physician (Otolaryngology) Chilton Greathouse, MD as Consulting Physician (Pulmonary Disease) Edythe Lynn, MD as Referring Physician (Neurology)  PLACE OF SERVICE:  [facility]  Advanced Directive information Does Patient Have a Medical Advance Directive?: Yes, Type of Advance Directive: Healthcare Power of [place];Living will, Does patient want to make changes to medical advance directive?: No - Patient declined  Allergies  Allergen Reactions   Biaxin [Clarithromycin] Anaphylaxis   Sulfonamide Derivatives Itching and Rash   Tequin [Gatifloxacin] Anaphylaxis   Demerol Nausea And Vomiting   Lactose Intolerance (Gi) Other (See Comments)    Mild abdominal pain, gas, bloating   Nsaids Other (See Comments)    GI upset/stomach pain   Risedronate Sodium Nausea And Vomiting and Other (See Comments)     GI upset/heartburn    Rofecoxib Nausea And Vomiting and Other (See Comments)    GI upset/heartburn    Alendronate Sodium Nausea And Vomiting   Aspirin Other (See Comments)    Abdominal pain    Cardizem [Diltiazem Hcl] Hives   Eliquis [Apixaban] Hives   Fish Allergy Nausea And Vomiting    Can take fish oil   Benadryl [Diphenhydramine Hcl] Palpitations   Other Itching and Rash    EKG LEADS caused burning sores     Chief Complaint  Patient presents with   Medical Management of Chronic Issues    Routine follow-up visit. Discuss need for shingrix and covid boosters. Patient c/o left hand issues(pain, aching, and limited mobility) x 3 months. Patient with ongoing pain in back and buttocks.      HPI: Patient is  a 77 y.o. female for routine follow up.  She is currently doing PT- doing cardiac and strength exercises  Blood pressure was well controlled at PT.  She is taking amlodipine 5 mg daily with good blood pressure controlled.   Sodium 138- which has not been this high in years. Currently taking sodium tablets as she has for a year. Wants to cut back on sodium tablets because she gets so dry. Mouth is so dry she can not go to bed sometimes.   She was in a lot of pain yesterday due to the weather, back, shoulder and head pain  Today is better- somewhat dizzy but doing better. She does not drive a lot but was able to today. She was able to exercise today and always feels better after exercise.   GERD- controlled on protonix.   She strained her right hand opening a bottle. Reports hand is better but when she uses it it exacerbates the pain.   Low back pain with scatica- planned to discuss with Dr Sandria Manly but missed appt due to severe migraine, plans to follow up with neurologist in [place] for her migraine.  She had right lumbar laminectomy in 2014 by Dr Alvester Morin he has since retired. Now having increase in pain with any physical activity  Review of Systems:  Review of Systems  Constitutional:  Negative for chills, fever and weight loss.  HENT:  Negative for tinnitus.   Respiratory:  Negative for cough, sputum production and shortness of breath.   Cardiovascular:  Negative for chest pain, palpitations and leg swelling.  Gastrointestinal:  Negative for  abdominal pain, constipation, diarrhea and heartburn.       GERD  Genitourinary:  Negative for dysuria, frequency and urgency.  Musculoskeletal:  Positive for back pain, joint pain and myalgias. Negative for falls.  Skin: Negative.   Neurological:  Negative for dizziness and headaches.  Psychiatric/Behavioral:  Negative for depression and memory loss. The patient does not have insomnia.    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   high school; low grade appendiceal cancer   ATRIAL FIBRILLATION ABLATION N/A 06/17/2020   Procedure: ATRIAL FIBRILLATION ABLATION;  Surgeon: Hillis Range, MD;  Location: [facility];  Service: Cardiovascular;  Laterality: N/A;   BREAST ENHANCEMENT SURGERY Bilateral 1979   COLONOSCOPY  01/2012   Tics; Dr Dorena Cookey   ESI      X 3 in 2003 & 02/17/2011 for L4-S1 symptoms   implantable loop recorder placement  01/14/2021   Medtronic Reveal Linq model LNQ 22 ([place] [id number]) implantable loop recorder   LUMBAR LAMINECTOMY  12/2012   [facility] , [place]   TONSILLECTOMY  1950 or 1951   TUBAL LIGATION     vocal cords stripped  1970   Social History:   reports that she has never smoked. She has never used smokeless tobacco. She reports that she does not currently use alcohol. She reports that she does not use drugs.  Family History  Problem Relation Age of Onset   Alcohol abuse Mother    Hypertension Mother    Diabetes Mother 35   Stroke Mother 13   Dementia Mother    Kidney failure Mother    Osteoporosis Mother    Alzheimer's disease Father 59   Cancer Maternal Aunt 16       pancreatic   Arthritis Sister 31       knee replacement   Hyperlipidemia Sister    Lung disease Maternal Grandmother    Lung disease Maternal Grandfather    Hyperlipidemia Sister 26   Other Son        recent back injury   Other Daughter        recent back injury; knee issues when playing  tennis   Heart disease Neg Hx    Heart attack Neg Hx     Medications: Patient's Medications  New Prescriptions   No medications on file  Previous Medications   ACETAMINOPHEN (TYLENOL) 500 MG TABLET    Take 500 mg by mouth at bedtime as needed for moderate pain.   AMLODIPINE (NORVASC) 5 MG TABLET    Take 1 tablet (5 mg total) by mouth daily.   BACLOFEN (LIORESAL) 10 MG TABLET    Take 10 mg by mouth at bedtime.   BUTALBITAL-ACETAMINOPHEN-CAFFEINE (FIORICET) 50-325-40 MG TABLET    Take 1 tablet by mouth every 6 (six) hours as needed for migraine.  CALCIUM CITRATE-VITAMIN D (CALCIUM + D PO)    Take 1 tablet by mouth as directed. 3-4 times daily   CARBOXYMETHYLCELLULOSE SODIUM (THERATEARS) 0.25 % SOLN    Place 1 drop into both eyes 4 (four) times daily.   CHLORPHENIRAMINE (CHLOR-TRIMETON) 4 MG TABLET    Take 4-8 mg by mouth at bedtime.   CHOLECALCIFEROL (VITAMIN D3) LIQD    Take 1,000 Units by mouth daily.   EVENING PRIMROSE OIL 1000 MG CAPS    Take 1,000 mg by mouth every evening.   FAMOTIDINE (PEPCID) 20 MG TABLET    Take 20 mg by mouth daily.   FEMRING 0.05 MG/24HR RING    Place 1 each vaginally every 3 (three) months.   HYDRALAZINE (APRESOLINE) 25 MG TABLET    Take 1 tablet (25 mg total) by mouth 3 (three) times daily as needed (for systolic blood pressure greater than 150).   LUTEIN 20 MG TABS    Take 20 mg by mouth daily.    MAGNESIUM CITRATE PO    Take 300 mg by mouth daily.   MECLIZINE (ANTIVERT) 12.5 MG TABLET    TAKE 1 TO 2 TABLETS(12.5 TO 25 MG) BY MOUTH THREE TIMES DAILY AS NEEDED FOR DIZZINESS   METHOCARBAMOL (ROBAXIN) 750 MG TABLET    Take 750 mg by mouth every 6 (six) hours.   MULTIPLE VITAMIN (MULTIVITAMIN WITH MINERALS) TABS TABLET    Take 1 tablet by mouth daily.   MULTIPLE VITAMINS-MINERALS (PRESERVISION AREDS 2) CAPS    Take 1 capsule by mouth 2 (two) times daily.   OMEGA-3 1000 MG CAPS    Take 1,000 mg by mouth 2 (two) times daily.   ONDANSETRON (ZOFRAN-ODT) 4 MG  DISINTEGRATING TABLET    DISSOLVE 1 TABLET(4 MG) ON THE TONGUE EVERY 6 HOURS AS NEEDED FOR NAUSEA OR VOMITING   PANTOPRAZOLE (PROTONIX) 40 MG TABLET    Take 1 tablet (40 mg total) by mouth daily. Take 30-60 min before first meal of the day   SODIUM CHLORIDE 1 G TABLET    Take 1 g by mouth 4 (four) times daily.   UBRELVY 100 MG TABS    Take 1-2 tablets by mouth 2 (two) times daily as needed.   XARELTO 20 MG TABS TABLET    TAKE 1 TABLET DAILY WITH SUPPER  Modified Medications   No medications on file  Discontinued Medications   FAMOTIDINE (PEPCID) 20 MG TABLET    One after supper    Physical Exam:  Vitals:   07/19/22 1501  BP: 124/78  Pulse: 82  Temp: (!) 97.1 F (36.2 C)  TempSrc: Temporal  SpO2: 98%  Weight: 114 lb (51.7 kg)  Height: 5\' 6"  (1.676 m)   Body mass index is 18.4 kg/m. Wt Readings from Last 3 Encounters:  07/19/22 114 lb (51.7 kg)  04/19/22 109 lb 6.4 oz (49.6 kg)  04/07/22 114 lb (51.7 kg)    Physical Exam Constitutional:      General: She is not in acute distress.    Appearance: She is well-developed. She is not diaphoretic.  HENT:     Head: Normocephalic and atraumatic.     Mouth/Throat:     Pharynx: No oropharyngeal exudate.  Eyes:     Conjunctiva/sclera: Conjunctivae normal.     Pupils: Pupils are equal, round, and reactive to light.  Cardiovascular:     Rate and Rhythm: Normal rate and regular rhythm.     Heart sounds: Normal heart sounds.  Pulmonary:  Effort: Pulmonary effort is normal.     Breath sounds: Normal breath sounds.  Abdominal:     General: Bowel sounds are normal.     Palpations: Abdomen is soft.  Musculoskeletal:     Cervical back: Normal range of motion and neck supple.       Back:     Right lower leg: No edema.     Left lower leg: No edema.  Skin:    General: Skin is warm and dry.  Neurological:     Mental Status: She is alert.  Psychiatric:        Mood and Affect: Mood normal.     Labs reviewed: Basic Metabolic  Panel: Recent Labs    10/06/21 1149 03/31/22 1502 04/01/22 0006 04/19/22 1628 07/17/22 1514  NA 130* 130* 128*  --  138  K 5.1 4.5 4.2  --  4.9  CL 92* 93* 92*  --  101  CO2 25 29 28   --  31  GLUCOSE 132* 102 113*  --  100*  BUN 16 20 21   --  27*  CREATININE 0.56* 0.55* 0.63  --  0.59*  CALCIUM 9.8 9.8 9.2  --  9.7  MG 2.4* 2.4 2.7*  --   --   TSH 3.540  --   --  2.830  --    Liver Function Tests: No results for input(s): "AST", "ALT", "ALKPHOS", "BILITOT", "PROT", "ALBUMIN" in the last 8760 hours. No results for input(s): "LIPASE", "AMYLASE" in the last 8760 hours. No results for input(s): "AMMONIA" in the last 8760 hours. CBC: Recent Labs    10/06/21 1149 04/01/22 0006  WBC 7.3 6.3  NEUTROABS 5.9 5.3  HGB 14.3 12.7  HCT 42.3 38.5  MCV 91 92.3  PLT 319 242   Lipid Panel: No results for input(s): "CHOL", "HDL", "LDLCALC", "TRIG", "CHOLHDL", "LDLDIRECT" in the last 8760 hours. TSH: Recent Labs    10/06/21 1149 04/19/22 1628  TSH 3.540 2.830   A1C: Lab Results  Component Value Date   HGBA1C 5.6 03/03/2011     Assessment/Plan 1. Chronic hyponatremia -improved on recent labs, she would like to go down to 3 sodium tablets daily, in agreement with this and we will follow up bmp in 6 weeks - BASIC METABOLIC PANEL WITH GFR; Future  2. Primary hypertension Well controlled on norvasc, continue current regimen  3. Dizziness -stable at this time, continues lifestyle modifications to help with symptom management.   4. Muscular dystrophy (HCC) -ongoing, continue PT   5. Gastroesophageal reflux disease without esophagitis -ongoing and stable, continues on protonix with lifestyle/diet modifications for symptoms relief.   6. Spinal stenosis of lumbar region, unspecified whether neurogenic claudication present -may need referral back to neurosurgery, will get xray -to use heating bad TID and tylenol PRN - DG Lumbar Spine Complete; Future  7. Hand pain,  left -has improved but still ongoing to left thumb.  To use ice PRN with muscle rub  -to notify for worsening pain.   Return in about 3 months (around 10/19/2022) for routine follow up .:   Cashis Rill K. Biagio [facility] [phone]

## 2022-07-21 ENCOUNTER — Ambulatory Visit: Payer: Medicare Other | Admitting: Nurse Practitioner

## 2022-07-21 NOTE — Progress Notes (Signed)
Carelink Summary Report / Loop Recorder 

## 2022-07-25 ENCOUNTER — Encounter: Payer: Self-pay | Admitting: Nurse Practitioner

## 2022-07-25 ENCOUNTER — Encounter: Payer: Self-pay | Admitting: Physician Assistant

## 2022-07-25 ENCOUNTER — Ambulatory Visit: Payer: Medicare Other | Attending: Physician Assistant | Admitting: Physician Assistant

## 2022-07-25 VITALS — BP 126/60 | HR 84 | Ht 66.0 in | Wt 113.4 lb

## 2022-07-25 DIAGNOSIS — E871 Hypo-osmolality and hyponatremia: Secondary | ICD-10-CM

## 2022-07-25 DIAGNOSIS — I4892 Unspecified atrial flutter: Secondary | ICD-10-CM

## 2022-07-25 DIAGNOSIS — I480 Paroxysmal atrial fibrillation: Secondary | ICD-10-CM

## 2022-07-25 DIAGNOSIS — Z79899 Other long term (current) drug therapy: Secondary | ICD-10-CM

## 2022-07-25 DIAGNOSIS — I10 Essential (primary) hypertension: Secondary | ICD-10-CM | POA: Diagnosis not present

## 2022-07-25 DIAGNOSIS — I484 Atypical atrial flutter: Secondary | ICD-10-CM | POA: Diagnosis not present

## 2022-07-25 DIAGNOSIS — D6869 Other thrombophilia: Secondary | ICD-10-CM | POA: Diagnosis not present

## 2022-07-25 MED ORDER — CARVEDILOL 3.125 MG PO TABS: 3.1250 mg | ORAL_TABLET | Freq: Two times a day (BID) | ORAL | 3 refills | Status: DC

## 2022-07-25 NOTE — Progress Notes (Signed)
Office Visit    Patient Name: Alicia [name] Date of Encounter: 07/25/2022  PCP:  [name], NP   [facility]  Cardiologist:  Meriam Sprague, MD  Advanced Practice Provider:  No care team member to display Electrophysiologist:  Lanier Prude, MD   HPI    Alicia [name] is a 77 y.o. female with a past medical history of paroxysmal atrial fibrillation status post ablation, PVCs, peripheral neuropathy presents today for follow-up appointment.  Was seen by Dr. Lalla [name] initially and then referred to general cardiology for further management.  Per record, history of paroxysmal atrial fibrillation and underwent successful ablation 06/2020 with no recurrence of atrial fibrillation on loop monitor.  Was weaned off of metoprolol and remained on Xarelto.  Was seen by Dr. Shari [name] in February 2024 and had several questions.  Recently seen in the ER January 2024 for elevated blood pressure which was new for her.  Symptoms began suddenly BP was as high as 200/100 prompting her to go to the ER.  In the ER, workup was reassuring and BP improved.  She was started on amlodipine.  Since starting her blood pressure has been fluctuating it had been anywhere from 110-1 140s.  States she had been very concerned as she typically has low blood pressure.  Heart rate increased to 80s which was higher than usual.  Notably, she was off metoprolol which made her feel very fatigued.  Concerned about her blood pressure.  Today, she tells me she is having more frequent weakness, dizziness, losing her balance, and falling.  Frequent PVCs (4% on her monitor).  This is more frequent than previously.  Her metoprolol was discontinued due to dizziness.  She is hesitant to start her on beta-blocker due to this.  She states sometimes her PVCs wake her up and feels like her whole bed is shaking.  Since she has not COVID-19 shot she symptoms dizziness.  She also has a history of vertigo  for which she has been worked up by an ENT.  She also struggles with hyponatremia and is on 4 g of sodium a day the at times.  She was diagnosed with mild Mnire's years ago and was started on the salt tabs about 2 years ago.  However, has had no hyponatremia since 2013.  Reports no shortness of breath nor dyspnea on exertion. Reports no chest pain, pressure, or tightness. No edema, orthopnea, PND.    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   Chronic migraine without aura, intractable, without status migrainosus    Diverticulosis 2010   Endometriosis    Low sodium levels    Malnutrition (HCC)    MD (muscular dystrophy) (HCC)    Meniere's disease, bilateral    [facility]ste[facility]    [facility]   Peripheral neuropathy    Dr Alicia [name]   Pneumonia    Sensorineural hearing loss, bilateral    Vertigo 2019   Past Surgical History:  Procedure Laterality Date   ABDOMINAL HYSTERECTOMY  1980 or 1981   for Endometriosis   APPENDECTOMY  1965   high school; low grade appendiceal cancer   ATRIAL FIBRILLATION ABLATION N/A 06/17/2020   Procedure: ATRIAL FIBRILLATION ABLATION;  Surgeon: Hillis Range, MD;  Location: [facility] INVASIVE CV [facility];  Service: Cardiovascular;  Laterality: N/A;   BREAST ENHANCEMENT SURGERY Bilateral 1979   COLONOSCOPY  01/2012   Tics; Dr Dorena Cookey   ESI      X 3 in 2003 & 02/17/2011 for L4-S1 symptoms   implantable loop recorder placement  01/14/2021   Medtronic Reveal Linq model LNQ 22 ([place] [id number]) implantable loop recorder   LUMBAR LAMINECTOMY  12/2012   [place] , [place]   TONSILLECTOMY  1950 or 1951   TUBAL LIGATION     vocal cords stripped  1970    Allergies  Allergies  Allergen Reactions   Biaxin [Clarithromycin] Anaphylaxis   Sulfonamide Derivatives Itching and Rash   Tequin [Gatifloxacin] Anaphylaxis   Demerol Nausea And Vomiting   Lactose Intolerance (Gi) Other (See Comments)    Mild  abdominal pain, gas, bloating   Nsaids Other (See Comments)    GI upset/stomach pain   Risedronate Sodium Nausea And Vomiting and Other (See Comments)     GI upset/heartburn    Rofecoxib Nausea And Vomiting and Other (See Comments)    GI upset/heartburn    Alendronate Sodium Nausea And Vomiting   Aspirin Other (See Comments)    Abdominal pain    Cardizem [Diltiazem Hcl] Hives   Eliquis [Apixaban] Hives   Fish Allergy Nausea And Vomiting    Can take fish oil   Benadryl [Diphenhydramine Hcl] Palpitations   Other Itching and Rash    EKG LEADS caused burning sores     EKGs/Labs/Other Studies Reviewed:   The following studies were reviewed today: Cardiac Studies & Procedures     STRESS TESTS  MYOCARDIAL PERFUSION IMAGING 10/23/2014  Narrative  Nuclear stress EF: 80%.  The ejection fraction is greater than 70%. Wall motion is normal. The nuclear images including the raw data shows evidence of shifting breast attenuation. There is no scar or ischemia. There is no diagnostic abnormality. This is a low risk scan.   ECHOCARDIOGRAM  ECHOCARDIOGRAM COMPLETE 04/06/2020  Narrative ECHOCARDIOGRAM REPORT    Patient Name:   Alicia [name] Date of Exam: 04/06/2020 Medical Rec #:  [id number]         Height:       66.0 in Accession #:    [id number]        Weight:       105.0 lb Date of Birth:  [birth year]        BSA:          1.521 m Patient Age:    74 years          BP:           113/65 mmHg Patient Gender: F                 HR:           82 bpm. Exam Location:  Inpatient  Procedure: 2D Echo, Color Doppler and Cardiac Doppler  Indications:    R07.9* Chest pain, unspecified  History:        Patient has prior history of Echocardiogram examinations, most recent 07/11/2012. Arrythmias:Atrial Fibrillation; Risk Factors:Dyslipidemia.  Sonographer:    Irving Burton Senior RDCS Referring Phys: [id number] RAMESH KC   Sonographer Comments: Very poor echo windows due to rib shadowing and  implants. Patient unable to tolerate any probe pressure. IMPRESSIONS   1. Left ventricular ejection fraction, by estimation, is 60 to 65%. The left ventricle has normal function. The left ventricle has no regional wall motion abnormalities. Left ventricular diastolic parameters were normal. 2. Right ventricular systolic function is normal. The right ventricular size is normal. 3. The mitral valve  is normal in structure. No evidence of mitral valve regurgitation. No evidence of mitral stenosis. 4. The aortic valve is normal in structure. Aortic valve regurgitation is not visualized. No aortic stenosis is present. 5. The inferior vena cava is normal in size with greater than 50% respiratory variability, suggesting right atrial pressure of 3 mmHg.  FINDINGS Left Ventricle: Left ventricular ejection fraction, by estimation, is 60 to 65%. The left ventricle has normal function. The left ventricle has no regional wall motion abnormalities. The left ventricular internal cavity size was normal in size. There is no left ventricular hypertrophy. Left ventricular diastolic parameters were normal. Indeterminate filling pressures.  Right Ventricle: The right ventricular size is normal. No increase in right ventricular wall thickness. Right ventricular systolic function is normal.  Left Atrium: Left atrial size was normal in size.  Right Atrium: Right atrial size was normal in size.  Pericardium: There is no evidence of pericardial effusion.  Mitral Valve: The mitral valve is normal in structure. Mild mitral annular calcification. No evidence of mitral valve regurgitation. No evidence of mitral valve stenosis.  Tricuspid Valve: The tricuspid valve is normal in structure. Tricuspid valve regurgitation is not demonstrated. No evidence of tricuspid stenosis.  Aortic Valve: The aortic valve is normal in structure. Aortic valve regurgitation is not visualized. No aortic stenosis is present.  Pulmonic Valve:  The pulmonic valve was normal in structure. Pulmonic valve regurgitation is not visualized. No evidence of pulmonic stenosis.  Aorta: The aortic root is normal in size and structure.  Venous: The inferior vena cava is normal in size with greater than 50% respiratory variability, suggesting right atrial pressure of 3 mmHg.  IAS/Shunts: No atrial level shunt detected by color flow Doppler.   LEFT VENTRICLE PLAX 2D LVIDd:         3.80 cm  Diastology LVIDs:         2.60 cm  LV e' medial:    6.64 cm/s LV PW:         0.80 cm  LV E/e' medial:  14.2 LV IVS:        0.80 cm  LV e' lateral:   8.16 cm/s LVOT diam:     2.00 cm  LV E/e' lateral: 11.6 LV SV:         66 LV SV Index:   43 LVOT Area:     3.14 cm   RIGHT VENTRICLE RV S prime:     13.50 cm/s TAPSE (M-mode): 2.6 cm  LEFT ATRIUM           Index       RIGHT ATRIUM           Index LA diam:      2.60 cm 1.71 cm/m  RA Area:     14.90 cm LA Vol (A4C): 39.2 ml 25.78 ml/m RA Volume:   38.50 ml  25.32 ml/m AORTIC VALVE LVOT Vmax:   88.80 cm/s LVOT Vmean:  62.300 cm/s LVOT VTI:    0.209 m  AORTA Ao Root diam: 2.30 cm  MITRAL VALVE MV Area (PHT): 4.60 cm    SHUNTS MV Decel Time: 165 msec    Systemic VTI:  0.21 m MV E velocity: 94.30 cm/s  Systemic Diam: 2.00 cm MV A velocity: 80.60 cm/s MV E/A ratio:  1.17  Mihai Croitoru MD Electronically signed by Thurmon Fair MD Signature Date/Time: 04/06/2020/3:47:55 PM    Final    MONITORS  CARDIAC EVENT MONITOR 02/08/2016   CT SCANS  CT CORONARY MORPH W/CTA COR W/SCORE 04/09/2020  Addendum 04/09/2020 11:52 AM ADDENDUM REPORT: 04/09/2020 11:49  CLINICAL DATA:  77 year old female with shortness of breath and palpitation.  EXAM: Cardiac/Coronary  CTA  TECHNIQUE: The patient was scanned on a [employer].  FINDINGS: A 100 kV prospective scan was triggered in the descending thoracic aorta at 111 HU's. Axial non-contrast 3 mm slices were carried out through the  heart. The data set was analyzed on a dedicated work station and scored using the Agatson method. Gantry rotation speed was 250 msecs and collimation was .6 mm. 10 mg of PO Ivabradine and 0.8 mg of sl NTG were given. The 3D data set was reconstructed in 5% intervals of the 67-82 % of the R-R cycle. Diastolic phases were analyzed on a dedicated work station using MPR, MIP and VRT modes. The patient received 80 cc of contrast.  Aorta: Normal size. Mild diffuse atherosclerotic plaque with diffuse calcifications. No dissection.  Aortic Valve:  Trileaflet.  Mild cusp calcifications.  Coronary Arteries:  Normal coronary origin.  Right dominance.  RCA is a large dominant artery that gives rise to PDA and PLA. There is no plaque.  Left main is a large artery that gives rise to LAD and LCX arteries. Let main has no plaque.  LAD is a large tortuous vessel that gives rise to two diagonal arteries and has no plaque.  D1,2 are medium size arteries with no plaque.  LCX is a very small non-dominant artery that has no plaque.  Other findings:  Normal pulmonary vein drainage into the left atrium.  Normal left atrial appendage without a thrombus.  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  IMPRESSION: 1. Coronary calcium score of 0. This was 0 percentile for age and sex matched control.  2. Normal coronary origin with right dominance.  3. CAD-RADS 0. No evidence of CAD (0%). Consider non-atherosclerotic causes of chest pain.  4. 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   Electronically Signed By: Tobias Alexander On: 04/09/2020 11:49  Narrative EXAM: OVER-READ INTERPRETATION  CT CHEST  The following report is an over-read performed by radiologist Dr. Trudie Reed of [facility] on 04/09/2020. This over-read does not include interpretation of cardiac or coronary anatomy or pathology. The coronary calcium score/coronary CTA interpretation by the  cardiologist is attached.  COMPARISON:  None.  FINDINGS: Aortic atherosclerosis. Within the visualized portions of the thorax there are no suspicious appearing pulmonary nodules or masses, there is no acute consolidative airspace disease, no pleural effusions, no pneumothorax and no lymphadenopathy. Visualized portions of the upper abdomen are unremarkable. There are no aggressive appearing lytic or blastic lesions noted in the visualized portions of the skeleton. Bilateral breast implants with dense capsular calcifications are incidentally noted.  IMPRESSION: 1.  Aortic Atherosclerosis (ICD10-I70.0).  Electronically Signed: By: Trudie Reed M.D. On: 04/09/2020 11:02           EKG:  EKG is not ordered today.    Recent Labs: 04/01/2022: Hemoglobin 12.7; Magnesium 2.7; Platelets 242 04/19/2022: TSH 2.830 07/17/2022: BUN 27; Creat 0.59; Potassium 4.9; Sodium 138  Recent Lipid Panel    Component Value Date/Time   CHOL 192 03/19/2018 1005   TRIG 52 03/19/2018 1005   TRIG 46 01/29/2006 1132   HDL 88 03/19/2018 1005   CHOLHDL 2.2 03/19/2018 1005   VLDL 9.4 08/20/2012 1032   LDLCALC 91 03/19/2018 1005   LDLDIRECT 124.7 08/20/2012 1032     Home Medications  Current Meds  Medication Sig   acetaminophen (TYLENOL) 500 MG tablet Take 500 mg by mouth at bedtime as needed for moderate pain.   amLODipine (NORVASC) 5 MG tablet Take 1 tablet (5 mg total) by mouth daily.   baclofen (LIORESAL) 10 MG tablet Take 10 mg by mouth at bedtime.   butalbital-acetaminophen-caffeine (FIORICET) 50-325-40 MG tablet Take 1 tablet by mouth every 6 (six) hours as needed for migraine.   Calcium Citrate-Vitamin D (CALCIUM + D PO) Take 1 tablet by mouth as directed. 3-4 times daily   Carboxymethylcellulose Sodium (THERATEARS) 0.25 % SOLN Place 1 drop into both eyes 4 (four) times daily.   carvedilol (COREG) 3.125 MG tablet Take 1 tablet (3.125 mg total) by mouth 2 (two) times daily.    chlorpheniramine (CHLOR-TRIMETON) 4 MG tablet Take 4-8 mg by mouth at bedtime.   Cholecalciferol (VITAMIN D3) LIQD Take 1,000 Units by mouth daily.   Evening Primrose Oil 1000 MG CAPS Take 1,000 mg by mouth every evening.   famotidine (PEPCID) 20 MG tablet Take 20 mg by mouth daily.   FEMRING 0.05 MG/24HR RING Place 1 each vaginally every 3 (three) months.   hydrALAZINE (APRESOLINE) 25 MG tablet Take 1 tablet (25 mg total) by mouth 3 (three) times daily as needed (for systolic blood pressure greater than 150).   Lutein 20 MG TABS Take 20 mg by mouth daily.    MAGNESIUM CITRATE PO Take 300 mg by mouth daily.   meclizine (ANTIVERT) 12.5 MG tablet TAKE 1 TO 2 TABLETS(12.5 TO 25 MG) BY MOUTH THREE TIMES DAILY AS NEEDED FOR DIZZINESS   methocarbamol (ROBAXIN) 750 MG tablet Take 750 mg by mouth every 6 (six) hours.   Multiple Vitamin (MULTIVITAMIN WITH MINERALS) TABS tablet Take 1 tablet by mouth daily.   Multiple Vitamins-Minerals (PRESERVISION AREDS 2) CAPS Take 1 capsule by mouth 2 (two) times daily.   Omega-3 1000 MG CAPS Take 1,000 mg by mouth 2 (two) times daily.   ondansetron (ZOFRAN-ODT) 4 MG disintegrating tablet DISSOLVE 1 TABLET(4 MG) ON THE TONGUE EVERY 6 HOURS AS NEEDED FOR NAUSEA OR VOMITING   pantoprazole (PROTONIX) 40 MG tablet Take 1 tablet (40 mg total) by mouth daily. Take 30-60 min before first meal of the day   sodium chloride 1 g tablet Take 1 g by mouth 4 (four) times daily.   UBRELVY 100 MG TABS Take 1-2 tablets by mouth 2 (two) times daily as needed.   XARELTO 20 MG TABS tablet TAKE 1 TABLET DAILY WITH SUPPER     Review of Systems      All other systems reviewed and are otherwise negative except as noted above.  Physical Exam    VS:  BP 126/60   Pulse 84   Ht 5\' 6"  (1.676 m)   Wt 113 lb 6.4 oz (51.4 kg)   SpO2 98%   BMI 18.30 kg/m  , BMI Body mass index is 18.3 kg/m.  Wt Readings from Last 3 Encounters:  07/25/22 113 lb 6.4 oz (51.4 kg)  07/19/22 114 lb (51.7  kg)  04/19/22 109 lb 6.4 oz (49.6 kg)     GEN: Thin appearing, well developed, in no acute distress. HEENT: normal. Neck: Supple, no JVD, carotid bruits, or masses. Cardiac:RRR, no murmurs, rubs, or gallops. No clubbing, cyanosis, edema.  Radials/PT 2+ and equal bilaterally.  Respiratory:  Respirations regular and unlabored, clear to auscultation bilaterally. GI: Soft, nontender, nondistended. MS: No deformity or atrophy. Skin: Warm and dry, no rash.  Neuro:  Strength and sensation are intact. Psych: Normal affect.  Assessment & Plan    Paroxysmal atrial fibrillation -NSR today -having more PVCs that are symptomatic -will trial coreg 3.125 BID -she is to keep track of her BP and HR for me  Atypical atrial flutter Continue current medications which include amlodipine at 5 mg daily we, Xarelto 20 mg daily, start carvedilol 3.125 mg twice daily  Hypertension  -she has not needed to take her hydralazine -continue current medications  Chronic hyponatremia -on salt tabs per PCP and recent sodium was in range (138)        Disposition: Follow up 3-4 weeks with Meriam Sprague, MD or APP.  Signed, Sharlene Dory, PA-C 07/25/2022, 5:11 PM [facility]

## 2022-07-25 NOTE — Patient Instructions (Signed)
Medication Instructions:  1.Start carvedilol (Coreg) 3.125 mg twice daily *If you need a refill on your cardiac medications before your next appointment, please call your pharmacy*  Lab Work: None ordered If you have labs (blood work) drawn today and your tests are completely normal, you will receive your results only by: MyChart Message (if you have MyChart) OR A paper copy in the mail If you have any lab test that is abnormal or we need to change your treatment, we will call you to review the results.  Follow-Up: At [facility], you and your health needs are our priority.  As part of our continuing mission to provide you with exceptional heart care, we have created designated Provider Care Teams.  These Care Teams include your primary Cardiologist (physician) and Advanced Practice Providers (APPs -  Physician Assistants and Nurse Practitioners) who all work together to provide you with the care you need, when you need it.  Your next appointment:   3 week(s)  Provider:   Jari Favre, PA-C        Other Instructions Check your blood pressure daily, 1 hr after morning medications for 2 weeks, keep a log and send [place] the readings through mychart at the end of the 2 weeks.   Low-Sodium Eating Plan Sodium, which is an element that makes up salt, helps you maintain a healthy balance of fluids in your body. Too much sodium can increase your blood pressure and cause fluid and waste to be held in your body. Your health care provider or dietitian may recommend following this plan if you have high blood pressure (hypertension), kidney disease, liver disease, or heart failure. Eating less sodium can help lower your blood pressure, reduce swelling, and protect your heart, liver, and kidneys. What are tips for following this plan? Reading food labels The Nutrition Facts label lists the amount of sodium in one serving of the food. If you eat more than one serving, you must multiply the listed  amount of sodium by the number of servings. Choose foods with less than 140 mg of sodium per serving. Avoid foods with 300 mg of sodium or more per serving. Shopping  Look for lower-sodium products, often labeled as "low-sodium" or "no salt added." Always check the sodium content, even if foods are labeled as "unsalted" or "no salt added." Buy fresh foods. Avoid canned foods and pre-made or frozen meals. Avoid canned, cured, or processed meats. Buy breads that have less than 80 mg of sodium per slice. Cooking  Eat more home-cooked food and less restaurant, buffet, and fast food. Avoid adding salt when cooking. Use salt-free seasonings or herbs instead of table salt or sea salt. Check with your health care provider or pharmacist before using salt substitutes. Cook with plant-based oils, such as canola, sunflower, or olive oil. Meal planning When eating at a restaurant, ask that your food be prepared with less salt or no salt, if possible. Avoid dishes labeled as brined, pickled, cured, smoked, or made with soy sauce, miso, or teriyaki sauce. Avoid foods that contain MSG (monosodium glutamate). MSG is sometimes added to [place] food, bouillon, and some canned foods. Make meals that can be grilled, baked, poached, roasted, or steamed. These are generally made with less sodium. General information Most people on this plan should limit their sodium intake to 1,500-2,000 mg (milligrams) of sodium each day. What foods should I eat? Fruits Fresh, frozen, or canned fruit. Fruit juice. Vegetables Fresh or frozen vegetables. "No salt added" canned  vegetables. "No salt added" tomato sauce and paste. Low-sodium or reduced-sodium tomato and vegetable juice. Grains Low-sodium cereals, including oats, puffed wheat and rice, and shredded wheat. Low-sodium crackers. Unsalted rice. Unsalted pasta. Low-sodium bread. Whole-grain breads and whole-grain pasta. Meats and other proteins Fresh or frozen (no salt  added) meat, poultry, seafood, and fish. Low-sodium canned tuna and salmon. Unsalted nuts. Dried peas, beans, and lentils without added salt. Unsalted canned beans. Eggs. Unsalted nut butters. Dairy Milk. Soy milk. Cheese that is naturally low in sodium, such as ricotta cheese, fresh mozzarella, or Swiss cheese. Low-sodium or reduced-sodium cheese. Cream cheese. Yogurt. Seasonings and condiments Fresh and dried herbs and spices. Salt-free seasonings. Low-sodium mustard and ketchup. Sodium-free salad dressing. Sodium-free light mayonnaise. Fresh or refrigerated horseradish. Lemon juice. Vinegar. Other foods Homemade, reduced-sodium, or low-sodium soups. Unsalted popcorn and pretzels. Low-salt or salt-free chips. The items listed above may not be a complete list of foods and beverages you can eat. Contact a dietitian for more information. What foods should I avoid? Vegetables Sauerkraut, pickled vegetables, and relishes. Olives. [place] fries. Onion rings. Regular canned vegetables (not low-sodium or reduced-sodium). Regular canned tomato sauce and paste (not low-sodium or reduced-sodium). Regular tomato and vegetable juice (not low-sodium or reduced-sodium). Frozen vegetables in sauces. Grains Instant hot cereals. Bread stuffing, pancake, and biscuit mixes. Croutons. Seasoned rice or pasta mixes. Noodle soup cups. Boxed or frozen macaroni and cheese. Regular salted crackers. Self-rising flour. Meats and other proteins Meat or fish that is salted, canned, smoked, spiced, or pickled. Precooked or cured meat, such as sausages or meat loaves. Alicia [name]. Ham. Pepperoni. Hot dogs. Corned beef. Chipped beef. Salt pork. Jerky. Pickled herring. Anchovies and sardines. Regular canned tuna. Salted nuts. Dairy Processed cheese and cheese spreads. Hard cheeses. Cheese curds. Blue cheese. Feta cheese. String cheese. Regular cottage cheese. Buttermilk. Canned milk. Fats and oils Salted butter. Regular margarine. Ghee.  Bacon fat. Seasonings and condiments Onion salt, garlic salt, seasoned salt, table salt, and sea salt. Canned and packaged gravies. Worcestershire sauce. Tartar sauce. Barbecue sauce. Teriyaki sauce. Soy sauce, including reduced-sodium. Steak sauce. Fish sauce. Oyster sauce. Cocktail sauce. Horseradish that you find on the shelf. Regular ketchup and mustard. Meat flavorings and tenderizers. Bouillon cubes. Hot sauce. Pre-made or packaged marinades. Pre-made or packaged taco seasonings. Relishes. Regular salad dressings. Salsa. Other foods Salted popcorn and pretzels. Corn chips and puffs. Potato and tortilla chips. Canned or dried soups. Pizza. Frozen entrees and pot pies. The items listed above may not be a complete list of foods and beverages you should avoid. Contact a dietitian for more information. Summary Eating less sodium can help lower your blood pressure, reduce swelling, and protect your heart, liver, and kidneys. Most people on this plan should limit their sodium intake to 1,500-2,000 mg (milligrams) of sodium each day. Canned, boxed, and frozen foods are high in sodium. Restaurant foods, fast foods, and pizza are also very high in sodium. You also get sodium by adding salt to food. Try to cook at home, eat more fresh fruits and vegetables, and eat less fast food and canned, processed, or prepared foods. This information is not intended to replace advice given to you by your health care provider. Make sure you discuss any questions you have with your health care provider. Document Revised: 01/27/2019 Document Reviewed: 01/22/2019 Elsevier Patient Education  2023 [employer]  Heart-Healthy Eating Plan Many factors influence your heart health, including eating and exercise habits. Heart health is also called coronary health. Coronary risk  increases with abnormal blood fat (lipid) levels. A heart-healthy eating plan includes limiting unhealthy fats, increasing healthy fats, limiting salt  (sodium) intake, and making other diet and lifestyle changes. What is my plan? Your health care provider may recommend that: You limit your fat intake to _________% or less of your total calories each day. You limit your saturated fat intake to _________% or less of your total calories each day. You limit the amount of cholesterol in your diet to less than _________ mg per day. You limit the amount of sodium in your diet to less than _________ mg per day. What are tips for following this plan? Cooking Cook foods using methods other than frying. Baking, boiling, grilling, and broiling are all good options. Other ways to reduce fat include: Removing the skin from poultry. Removing all visible fats from meats. Steaming vegetables in water or broth. Meal planning  At meals, imagine dividing your plate into fourths: Fill one-half of your plate with vegetables and green salads. Fill one-fourth of your plate with whole grains. Fill one-fourth of your plate with lean protein foods. Eat 2-4 cups of vegetables per day. One cup of vegetables equals 1 cup (91 g) broccoli or cauliflower florets, 2 medium carrots, 1 large bell pepper, 1 large sweet potato, 1 large tomato, 1 medium white potato, 2 cups (150 g) raw leafy greens. Eat 1-2 cups of fruit per day. One cup of fruit equals 1 small apple, 1 large banana, 1 cup (237 g) mixed fruit, 1 large orange,  cup (82 g) dried fruit, 1 cup (240 mL) 100% fruit juice. Eat more foods that contain soluble fiber. Examples include apples, broccoli, carrots, beans, peas, and barley. Aim to get 25-30 g of fiber per day. Increase your consumption of legumes, nuts, and seeds to 4-5 servings per week. One serving of dried beans or legumes equals  cup (90 g) cooked, 1 serving of nuts is  oz (12 almonds, 24 pistachios, or 7 walnut halves), and 1 serving of seeds equals  oz (8 g). Fats Choose healthy fats more often. Choose monounsaturated and polyunsaturated fats,  such as olive and canola oils, avocado oil, flaxseeds, walnuts, almonds, and seeds. Eat more omega-3 fats. Choose salmon, mackerel, sardines, tuna, flaxseed oil, and ground flaxseeds. Aim to eat fish at least 2 times each week. Check food labels carefully to identify foods with trans fats or high amounts of saturated fat. Limit saturated fats. These are found in animal products, such as meats, butter, and cream. Plant sources of saturated fats include palm oil, palm kernel oil, and coconut oil. Avoid foods with partially hydrogenated oils in them. These contain trans fats. Examples are stick margarine, some tub margarines, cookies, crackers, and other baked goods. Avoid fried foods. General information Eat more home-cooked food and less restaurant, buffet, and fast food. Limit or avoid alcohol. Limit foods that are high in added sugar and simple starches such as foods made using white refined flour (white breads, pastries, sweets). Lose weight if you are overweight. Losing just 5-10% of your body weight can help your overall health and prevent diseases such as diabetes and heart disease. Monitor your sodium intake, especially if you have high blood pressure. Talk with your health care provider about your sodium intake. Try to incorporate more vegetarian meals weekly. What foods should I eat? Fruits All fresh, canned (in natural juice), or frozen fruits. Vegetables Fresh or frozen vegetables (raw, steamed, roasted, or grilled). Green salads. Grains Most grains. Choose whole wheat  and whole grains most of the time. Rice and pasta, including brown rice and pastas made with whole wheat. Meats and other proteins Lean, well-trimmed beef, veal, pork, and lamb. Chicken and [place] without skin. All fish and shellfish. Wild duck, rabbit, pheasant, and venison. Egg whites or low-cholesterol egg substitutes. Dried beans, peas, lentils, and tofu. Seeds and most nuts. Dairy Low-fat or nonfat cheeses,  including ricotta and mozzarella. Skim or 1% milk (liquid, powdered, or evaporated). Buttermilk made with low-fat milk. Nonfat or low-fat yogurt. Fats and oils Non-hydrogenated (trans-free) margarines. Vegetable oils, including soybean, sesame, sunflower, olive, avocado, peanut, safflower, corn, canola, and cottonseed. Salad dressings or mayonnaise made with a vegetable oil. Beverages Water (mineral or sparkling). Coffee and tea. Unsweetened ice tea. Diet beverages. Sweets and desserts Sherbet, gelatin, and fruit ice. Small amounts of dark chocolate. Limit all sweets and desserts. Seasonings and condiments All seasonings and condiments. The items listed above may not be a complete list of foods and beverages you can eat. Contact a dietitian for more options. What foods should I avoid? Fruits Canned fruit in heavy syrup. Fruit in cream or butter sauce. Fried fruit. Limit coconut. Vegetables Vegetables cooked in cheese, cream, or butter sauce. Fried vegetables. Grains Breads made with saturated or trans fats, oils, or whole milk. Croissants. Sweet rolls. Donuts. High-fat crackers, such as cheese crackers and chips. Meats and other proteins Fatty meats, such as hot dogs, ribs, sausage, bacon, rib-eye roast or steak. High-fat deli meats, such as salami and bologna. Caviar. Domestic duck and goose. Organ meats, such as liver. Dairy Cream, sour cream, cream cheese, and creamed cottage cheese. Whole-milk cheeses. Whole or 2% milk (liquid, evaporated, or condensed). Whole buttermilk. Cream sauce or high-fat cheese sauce. Whole-milk yogurt. Fats and oils Meat fat, or shortening. Cocoa butter, hydrogenated oils, palm oil, coconut oil, palm kernel oil. Solid fats and shortenings, including bacon fat, salt pork, lard, and butter. Nondairy cream substitutes. Salad dressings with cheese or sour cream. Beverages Regular sodas and any drinks with added sugar. Sweets and desserts Frosting. Pudding.  Cookies. Cakes. Pies. Milk chocolate or white chocolate. Buttered syrups. Full-fat ice cream or ice cream drinks. The items listed above may not be a complete list of foods and beverages to avoid. Contact a dietitian for more information. Summary Heart-healthy meal planning includes limiting unhealthy fats, increasing healthy fats, limiting salt (sodium) intake and making other diet and lifestyle changes. Lose weight if you are overweight. Losing just 5-10% of your body weight can help your overall health and prevent diseases such as diabetes and heart disease. Focus on eating a balance of foods, including fruits and vegetables, low-fat or nonfat dairy, lean protein, nuts and legumes, whole grains, and heart-healthy oils and fats. This information is not intended to replace advice given to you by your health care provider. Make sure you discuss any questions you have with your health care provider. Document Revised: 03/28/2021 Document Reviewed: 03/28/2021 Elsevier Patient Education  2023 [employer].

## 2022-07-27 ENCOUNTER — Telehealth: Payer: Self-pay

## 2022-07-27 DIAGNOSIS — I480 Paroxysmal atrial fibrillation: Secondary | ICD-10-CM

## 2022-07-27 MED ORDER — ATENOLOL 25 MG PO TABS: 25.0000 mg | ORAL_TABLET | Freq: Two times a day (BID) | ORAL | 3 refills | Status: DC

## 2022-07-27 MED ORDER — ATENOLOL 25 MG PO TABS: 25.0000 mg | ORAL_TABLET | Freq: Every day | ORAL | 3 refills | Status: DC

## 2022-07-27 NOTE — Telephone Encounter (Signed)
Patient called back to say she would like to script to go to [id number] - [place] Otto, [place] - [address] AT [facility] OF [address] & [address]

## 2022-07-27 NOTE — Addendum Note (Signed)
Addended by: Luellen Pucker on: 07/27/2022 04:56 PM   Modules accepted: Orders

## 2022-07-27 NOTE — Telephone Encounter (Signed)
Called patient to review her concerns about atenolol dose being too high. She is concerned that 25 mg is too high and is wondering if she can have a ranged dose where she takes a half tablet if her BP is low and a full tablet if her BP is higher. 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 Forwarded to T. Asa Lente. Advised patient I would let her know what PA says.

## 2022-07-27 NOTE — Telephone Encounter (Signed)
[place] with patient and she confirmed pharmacy . States she needs atenolol for tonight.  Did not see dosage, please call in please thank you

## 2022-07-27 NOTE — Telephone Encounter (Signed)
Spoke to [place] regarding patient's questions regarding taking atenolol when her BP has been less than 100/60 at times today. Advised that Julian Hy states 25 mg atenolol is commensurate w/ the 3.125 mg of coreg that was recommended to control her palpitations. Patient states her BP is usually 110's/60-70, but since she tried the coreg it has been lower than normal. She states she will check her BP before taking her atenolol tomorrow and call us if it is lower than 100/60 for advice as she is worried the coreg may be in her system still.

## 2022-07-27 NOTE — Telephone Encounter (Signed)
Called patient, no answer, left detailed VM message asking patient to call back so we can get her atenolol ordered and sent to correct pharmacy. Discontinued carvedilol on med list.

## 2022-08-10 MED ORDER — METOPROLOL TARTRATE 25 MG PO TABS: 12.5000 mg | ORAL_TABLET | Freq: Every day | ORAL | 3 refills | Status: DC | PRN

## 2022-08-10 MED ORDER — METOPROLOL TARTRATE 25 MG PO TABS: 12.5000 mg | ORAL_TABLET | ORAL | 3 refills | Status: DC | PRN

## 2022-08-10 MED ORDER — METOPROLOL SUCCINATE ER 25 MG PO TB24: 12.5000 mg | ORAL_TABLET | Freq: Two times a day (BID) | ORAL | 3 refills | Status: AC

## 2022-08-10 NOTE — Telephone Encounter (Signed)
Patient is not on atenolol. She takes metoprolol succinate 12.5 mg twice daily.  She has not used nor does she have a prescription for metoprolol tartrate.  Twice over the last week when BP didn't come down and she felt a lot of PVCs she's taken one extra half of Toprol XL.  This brought down BP but took longer due to XL, most of the time helped the PVCs too.  She is requesting to stay on 12.5 mg Toprol XL BID and then have the 12.5 Lopressor for breakthrough PVCs.  Pt would like a message back when provider replies.

## 2022-08-10 NOTE — Progress Notes (Signed)
Carelink Summary Report / Loop Recorder 

## 2022-08-16 DIAGNOSIS — M542 Cervicalgia: Secondary | ICD-10-CM | POA: Diagnosis not present

## 2022-08-16 DIAGNOSIS — M5431 Sciatica, right side: Secondary | ICD-10-CM | POA: Diagnosis not present

## 2022-08-16 DIAGNOSIS — M545 Low back pain, unspecified: Secondary | ICD-10-CM | POA: Diagnosis not present

## 2022-08-16 DIAGNOSIS — M25511 Pain in right shoulder: Secondary | ICD-10-CM | POA: Diagnosis not present

## 2022-08-21 ENCOUNTER — Ambulatory Visit (INDEPENDENT_AMBULATORY_CARE_PROVIDER_SITE_OTHER): Payer: Medicare Other

## 2022-08-21 DIAGNOSIS — I480 Paroxysmal atrial fibrillation: Secondary | ICD-10-CM | POA: Diagnosis not present

## 2022-08-21 LAB — CUP PACEART REMOTE DEVICE CHECK
Date Time Interrogation Session: 20240616230348
Implantable Pulse Generator Implant Date: 20221111

## 2022-08-22 ENCOUNTER — Ambulatory Visit: Payer: Medicare Other | Attending: Physician Assistant | Admitting: Physician Assistant

## 2022-08-22 ENCOUNTER — Encounter: Payer: Self-pay | Admitting: Physician Assistant

## 2022-08-22 VITALS — BP 118/70 | HR 80 | Ht 66.0 in | Wt 114.0 lb

## 2022-08-22 DIAGNOSIS — Z79899 Other long term (current) drug therapy: Secondary | ICD-10-CM | POA: Insufficient documentation

## 2022-08-22 DIAGNOSIS — I4891 Unspecified atrial fibrillation: Secondary | ICD-10-CM | POA: Insufficient documentation

## 2022-08-22 DIAGNOSIS — I493 Ventricular premature depolarization: Secondary | ICD-10-CM | POA: Diagnosis not present

## 2022-08-22 DIAGNOSIS — I10 Essential (primary) hypertension: Secondary | ICD-10-CM | POA: Insufficient documentation

## 2022-08-22 DIAGNOSIS — I483 Typical atrial flutter: Secondary | ICD-10-CM | POA: Diagnosis not present

## 2022-08-22 DIAGNOSIS — E871 Hypo-osmolality and hyponatremia: Secondary | ICD-10-CM | POA: Diagnosis not present

## 2022-08-22 DIAGNOSIS — I480 Paroxysmal atrial fibrillation: Secondary | ICD-10-CM | POA: Insufficient documentation

## 2022-08-22 NOTE — Patient Instructions (Signed)
Medication Instructions:  Your physician recommends that you continue on your current medications as directed. Please refer to the Current Medication list given to you today.  *If you need a refill on your cardiac medications before your next appointment, please call your pharmacy*  Lab Work: Send [place] a message with the results of your sodium level when your primary care provider draws it next week. If you have labs (blood work) drawn today and your tests are completely normal, you will receive your results only by: MyChart Message (if you have MyChart) OR A paper copy in the mail If you have any lab test that is abnormal or we need to change your treatment, we will call you to review the results.  Follow-Up: At [facility], you and your health needs are our priority.  As part of our continuing mission to provide you with exceptional heart care, we have created designated Provider Care Teams.  These Care Teams include your primary Cardiologist (physician) and Advanced Practice Providers (APPs -  Physician Assistants and Nurse Practitioners) who all work together to provide you with the care you need, when you need it.   Your next appointment:   You have been referred to see an electrophysiologist here in our office within the next 2 weeks.

## 2022-08-22 NOTE — Progress Notes (Signed)
Office Visit    Patient Name: Alicia [name] Date of Encounter: 08/22/2022  PCP:  Alicia Seller, NP   [facility]  Cardiologist:  Alicia Sprague, MD  Advanced Practice Provider:  No care team member to display Electrophysiologist:  Alicia Prude, MD   HPI    Alicia [name] is a 77 y.o. female with a past medical history of paroxysmal atrial fibrillation status post ablation, PVCs, peripheral neuropathy presents today for follow-up appointment.  Was seen by Alicia. Lalla [name] initially and then referred to general cardiology for further management.  Per record, history of paroxysmal atrial fibrillation and underwent successful ablation 06/2020 with no recurrence of atrial fibrillation on loop monitor.  Was weaned off of metoprolol and remained on Xarelto.  Was seen by Alicia. Shari [name] in February 2024 and had several questions.  Recently seen in the ER January 2024 for elevated blood pressure which was new for her.  Symptoms began suddenly BP was as high as 200/100 prompting her to go to the ER.  In the ER, workup was reassuring and BP improved.  She was started on amlodipine.  Since starting her blood pressure has been fluctuating it had been anywhere from 110-1 140s.  States she had been very concerned as she typically has low blood pressure.  Heart rate increased to 80s which was higher than usual.  Notably, she was off metoprolol which made her feel very fatigued.  Concerned about her blood pressure.  She was seen by me 5/21, she tells me she is having more frequent weakness, dizziness, losing her balance, and falling.  Frequent PVCs (4% on her monitor).  This is more frequent than previously.  Her metoprolol was discontinued due to dizziness.  She is hesitant to start her on beta-blocker due to this.  She states sometimes her PVCs wake her up and feels like her whole bed is shaking.  Since she has not COVID-19 shot she symptoms dizziness.  She also has a  history of vertigo for which she has been worked up by an ENT.  She also struggles with hyponatremia and is on 4 [name] of sodium a day the at times.  She was diagnosed with mild Mnire's years ago and was started on the salt tabs about 2 years ago.  However, has had no hyponatremia since 2013.  Today, she has had frequent PVCs, 6% according to her loop device.  She keeps a very close eye on this information.  Blood pressure at home is 131/76.  In the office today was 118/70.  She states that she is afraid to take the extra half of the dose of metoprolol because she is afraid to scan and drop her blood pressure too low.  We discussed getting a follow-up sodium lab next week and then decide on whether we can drop her amlodipine and increase her daily metoprolol succinate up to 25 mg twice a day.  We have been struggling to control her palpitations but also not drop her blood pressure too low.  I think it would be a good idea for her to go back to Alicia. Lalla [name] and discuss further options.  She tells me she is very symptomatic when she has palpitations and feels like she cannot get a deep breath.  She also gets lightheaded and dizzy and feels off balance.  She states in the middle of the night she had an episode where her heart was beating fast but was not irregular.  Sounds like may be SVT.  She also is experiencing sleepiness all the time.  She feels like she could fall asleep while writing a letter.  We discussed repeating an echocardiogram  Reports no shortness of breath nor dyspnea on exertion. Reports no chest pain, pressure, or tightness. No edema, orthopnea, PND.    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   Chronic migraine without aura, intractable, without status migrainosus    Diverticulosis 2010   Endometriosis    Low sodium levels    Malnutrition (HCC)    MD (muscular dystrophy) (HCC)    Meniere's disease, bilateral     [facility]   Peripheral neuropathy    Alicia Alicia [name]   Pneumonia    Sensorineural hearing loss, bilateral    Vertigo 2019   Past Surgical History:  Procedure Laterality Date   ABDOMINAL HYSTERECTOMY  1980 or 1981   for Endometriosis   APPENDECTOMY  1965   high school; low grade appendiceal cancer   ATRIAL FIBRILLATION ABLATION N/A 06/17/2020   Procedure: ATRIAL FIBRILLATION ABLATION;  Surgeon: Alicia Range, MD;  Location: [facility] INVASIVE CV LAB;  Service: Cardiovascular;  Laterality: N/A;   BREAST ENHANCEMENT SURGERY Bilateral 1979   COLONOSCOPY  01/2012   Tics; Alicia [name]   ESI      X 3 in 2003 & 02/17/2011 for L4-S1 symptoms   implantable loop recorder placement  01/14/2021   [employer]tronic Reveal Linq model LNQ 22 (Alicia [name]) implantable loop recorder   LUMBAR LAMINECTOMY  12/2012   [place]   TONSILLECTOMY  1950 or 1951   TUBAL LIGATION     vocal cords stripped  1970    Allergies  Allergies  Allergen Reactions   Biaxin [Clarithromycin] Anaphylaxis   Sulfonamide Derivatives Itching and Rash   Tequin [Gatifloxacin] Anaphylaxis   Demerol Nausea And Vomiting   Lactose Intolerance (Gi) Other (See Comments)    Mild abdominal pain, gas, bloating   Nsaids Other (See Comments)    GI upset/stomach pain   Risedronate Sodium Nausea And Vomiting and Other (See Comments)     GI upset/heartburn    Rofecoxib Nausea And Vomiting and Other (See Comments)    GI upset/heartburn    Alendronate Sodium Nausea And Vomiting   Aspirin Other (See Comments)    Abdominal pain    Cardizem [Diltiazem Hcl] Hives   Eliquis [Apixaban] Hives   Fish Allergy Nausea And Vomiting    Can take fish oil   Benadryl [Diphenhydramine Hcl] Palpitations   Other Itching and Rash    EKG LEADS caused burning sores     EKGs/Labs/Other Studies Reviewed:   The following studies were reviewed today: Cardiac Studies & Procedures     STRESS TESTS  MYOCARDIAL PERFUSION IMAGING  10/23/2014  Narrative  Nuclear stress EF: 80%.  The ejection fraction is greater than 70%. Wall motion is normal. The nuclear images including the raw data shows evidence of shifting breast attenuation. There is no scar or ischemia. There is no diagnostic abnormality. This is a low risk scan.   ECHOCARDIOGRAM  ECHOCARDIOGRAM COMPLETE 04/06/2020  Narrative ECHOCARDIOGRAM REPORT    Patient Name:   CALIEGH MCCURRY Date of Exam: 04/06/2020 Medical Rec #:  [id number]         Height:       66.0 in Accession #:  [id number]        Weight:       105.0 lb Date of Birth:  Apr 28, 1945        BSA:          1.521 m Patient Age:    74 years          BP:           113/65 mmHg Patient Gender: F                 HR:           82 bpm. Exam Location:  Inpatient  Procedure: 2D Echo, Color Doppler and Cardiac Doppler  Indications:    R07.9* Chest pain, unspecified  History:        Patient has prior history of Echocardiogram examinations, most recent 07/11/2012. Arrythmias:Atrial Fibrillation; Risk Factors:Dyslipidemia.  Sonographer:    Irving Burton Senior RDCS Referring Phys: [id number] RAMESH KC   Sonographer Comments: Very poor echo windows due to rib shadowing and implants. Patient unable to tolerate any probe pressure. IMPRESSIONS   1. Left ventricular ejection fraction, by estimation, is 60 to 65%. The left ventricle has normal function. The left ventricle has no regional wall motion abnormalities. Left ventricular diastolic parameters were normal. 2. Right ventricular systolic function is normal. The right ventricular size is normal. 3. The mitral valve is normal in structure. No evidence of mitral valve regurgitation. No evidence of mitral stenosis. 4. The aortic valve is normal in structure. Aortic valve regurgitation is not visualized. No aortic stenosis is present. 5. The inferior vena cava is normal in size with greater than 50% respiratory variability, suggesting right atrial pressure of 3  mmHg.  FINDINGS Left Ventricle: Left ventricular ejection fraction, by estimation, is 60 to 65%. The left ventricle has normal function. The left ventricle has no regional wall motion abnormalities. The left ventricular internal cavity size was normal in size. There is no left ventricular hypertrophy. Left ventricular diastolic parameters were normal. Indeterminate filling pressures.  Right Ventricle: The right ventricular size is normal. No increase in right ventricular wall thickness. Right ventricular systolic function is normal.  Left Atrium: Left atrial size was normal in size.  Right Atrium: Right atrial size was normal in size.  Pericardium: There is no evidence of pericardial effusion.  Mitral Valve: The mitral valve is normal in structure. Mild mitral annular calcification. No evidence of mitral valve regurgitation. No evidence of mitral valve stenosis.  Tricuspid Valve: The tricuspid valve is normal in structure. Tricuspid valve regurgitation is not demonstrated. No evidence of tricuspid stenosis.  Aortic Valve: The aortic valve is normal in structure. Aortic valve regurgitation is not visualized. No aortic stenosis is present.  Pulmonic Valve: The pulmonic valve was normal in structure. Pulmonic valve regurgitation is not visualized. No evidence of pulmonic stenosis.  Aorta: The aortic root is normal in size and structure.  Venous: The inferior vena cava is normal in size with greater than 50% respiratory variability, suggesting right atrial pressure of 3 mmHg.  IAS/Shunts: No atrial level shunt detected by color flow Doppler.   LEFT VENTRICLE PLAX 2D LVIDd:         3.80 cm  Diastology LVIDs:         2.60 cm  LV e' medial:    6.64 cm/s LV PW:         0.80 cm  LV E/e' medial:  14.2 LV IVS:        0.80 cm  LV e'  lateral:   8.16 cm/s LVOT diam:     2.00 cm  LV E/e' lateral: 11.6 LV SV:         66 LV SV Index:   43 LVOT Area:     3.14 cm   RIGHT VENTRICLE RV S prime:      13.50 cm/s TAPSE (M-mode): 2.6 cm  LEFT ATRIUM           Index       RIGHT ATRIUM           Index LA diam:      2.60 cm 1.71 cm/m  RA Area:     14.90 cm LA Vol (A4C): 39.2 ml 25.78 ml/m RA Volume:   38.50 ml  25.32 ml/m AORTIC VALVE LVOT Vmax:   88.80 cm/s LVOT Vmean:  62.300 cm/s LVOT VTI:    0.209 m  AORTA Ao Root diam: 2.30 cm  MITRAL VALVE MV Area (PHT): 4.60 cm    SHUNTS MV Decel Time: 165 msec    Systemic VTI:  0.21 m MV E velocity: 94.30 cm/s  Systemic Diam: 2.00 cm MV A velocity: 80.60 cm/s MV E/A ratio:  1.17  Mihai Croitoru MD Electronically signed by Thurmon Fair MD Signature Date/Time: 04/06/2020/3:47:55 PM    Final    MONITORS  CARDIAC EVENT MONITOR 02/08/2016   CT SCANS  CT CORONARY MORPH W/CTA COR W/SCORE 04/09/2020  Addendum 04/09/2020 11:52 AM ADDENDUM REPORT: 04/09/2020 11:49  CLINICAL DATA:  77 year old female with shortness of breath and palpitation.  EXAM: Cardiac/Coronary  CTA  TECHNIQUE: The patient was scanned on a [employer].  FINDINGS: A 100 kV prospective scan was triggered in the descending thoracic aorta at 111 HU's. Axial non-contrast 3 mm slices were carried out through the heart. The data set was analyzed on a dedicated work station and scored using the Agatson method. Gantry rotation speed was 250 msecs and collimation was .6 mm. 10 mg of PO Ivabradine and 0.8 mg of sl NTG were given. The 3D data set was reconstructed in 5% intervals of the 67-82 % of the R-R cycle. Diastolic phases were analyzed on a dedicated work station using MPR, MIP and VRT modes. The patient received 80 cc of contrast.  Aorta: Normal size. Mild diffuse atherosclerotic plaque with diffuse calcifications. No dissection.  Aortic Valve:  Trileaflet.  Mild cusp calcifications.  Coronary Arteries:  Normal coronary origin.  Right dominance.  RCA is a large dominant artery that gives rise to PDA and PLA. There is no plaque.  Left  main is a large artery that gives rise to LAD and LCX arteries. Let main has no plaque.  LAD is a large tortuous vessel that gives rise to two diagonal arteries and has no plaque.  D1,2 are medium size arteries with no plaque.  LCX is a very small non-dominant artery that has no plaque.  Other findings:  Normal pulmonary vein drainage into the left atrium.  Normal left atrial appendage without a thrombus.  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  IMPRESSION: 1. Coronary calcium score of 0. This was 0 percentile for age and sex matched control.  2. Normal coronary origin with right dominance.  3. CAD-RADS 0. No evidence of CAD (0%). Consider non-atherosclerotic causes of chest pain.  4. 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   Electronically Signed By: Tobias Alexander On: 04/09/2020 11:49  Narrative EXAM: OVER-READ INTERPRETATION  CT CHEST  The following report is an over-read performed  by radiologist Alicia. Trudie Reed of [facility] on 04/09/2020. This over-read does not include interpretation of cardiac or coronary anatomy or pathology. The coronary calcium score/coronary CTA interpretation by the cardiologist is attached.  COMPARISON:  None.  FINDINGS: Aortic atherosclerosis. Within the visualized portions of the thorax there are no suspicious appearing pulmonary nodules or masses, there is no acute consolidative airspace disease, no pleural effusions, no pneumothorax and no lymphadenopathy. Visualized portions of the upper abdomen are unremarkable. There are no aggressive appearing lytic or blastic lesions noted in the visualized portions of the skeleton. Bilateral breast implants with dense capsular calcifications are incidentally noted.  IMPRESSION: 1.  Aortic Atherosclerosis (ICD10-I70.0).  Electronically Signed: By: Trudie Reed M.D. On: 04/09/2020 11:02           EKG:  EKG is not ordered today.    Recent  Labs: 04/01/2022: Hemoglobin 12.7; Magnesium 2.7; Platelets 242 04/19/2022: TSH 2.830 07/17/2022: BUN 27; Creat 0.59; Potassium 4.9; Sodium 138  Recent Lipid Panel    Component Value Date/Time   CHOL 192 03/19/2018 1005   TRIG 52 03/19/2018 1005   TRIG 46 01/29/2006 1132   HDL 88 03/19/2018 1005   CHOLHDL 2.2 03/19/2018 1005   VLDL 9.4 08/20/2012 1032   LDLCALC 91 03/19/2018 1005   LDLDIRECT 124.7 08/20/2012 1032     Home Medications   Current Meds  Medication Sig   acetaminophen (TYLENOL) 500 MG tablet Take 500 mg by mouth at bedtime as needed for moderate pain.   amLODipine (NORVASC) 5 MG tablet Take 1 tablet (5 mg total) by mouth daily. (Patient taking differently: Take 2.5 mg by mouth daily. Take 1/2 tab po daily)   baclofen (LIORESAL) 10 MG tablet Take 10 mg by mouth at bedtime.   butalbital-acetaminophen-caffeine (FIORICET) 50-325-40 MG tablet Take 1 tablet by mouth every 6 (six) hours as needed for migraine.   Calcium Citrate-Vitamin D (CALCIUM + D PO) Take 1 tablet by mouth as directed. 3-4 times daily   Carboxymethylcellulose Sodium (THERATEARS) 0.25 % SOLN Place 1 drop into both eyes 4 (four) times daily.   chlorpheniramine (CHLOR-TRIMETON) 4 MG tablet Take 4-8 mg by mouth at bedtime.   Cholecalciferol (VITAMIN D3) LIQD Take 1,000 Units by mouth daily.   Evening Primrose Oil 1000 MG CAPS Take 1,000 mg by mouth every evening.   famotidine (PEPCID) 20 MG tablet Take 20 mg by mouth daily.   FEMRING 0.05 MG/24HR RING Place 1 each vaginally every 3 (three) months.   hydrALAZINE (APRESOLINE) 25 MG tablet Take 1 tablet (25 mg total) by mouth 3 (three) times daily as needed (for systolic blood pressure greater than 150).   Lutein 20 MG TABS Take 20 mg by mouth daily.    MAGNESIUM CITRATE PO Take 300 mg by mouth daily.   meclizine (ANTIVERT) 12.5 MG tablet TAKE 1 TO 2 TABLETS(12.5 TO 25 MG) BY MOUTH THREE TIMES DAILY AS NEEDED FOR DIZZINESS   methocarbamol (ROBAXIN) 750 MG tablet  Take 750 mg by mouth every 6 (six) hours.   metoprolol succinate (TOPROL XL) 25 MG 24 hr tablet Take 0.5 tablets (12.5 mg total) by mouth 2 (two) times daily.   metoprolol tartrate (LOPRESSOR) 25 MG tablet Take 0.5 tablets (12.5 mg total) by mouth as needed (for breakthrough palpitations).   Multiple Vitamin (MULTIVITAMIN WITH MINERALS) TABS tablet Take 1 tablet by mouth daily.   Multiple Vitamins-Minerals (PRESERVISION AREDS 2) CAPS Take 1 capsule by mouth 2 (two) times daily.   Omega-3  1000 MG CAPS Take 1,000 mg by mouth 2 (two) times daily.   ondansetron (ZOFRAN-ODT) 4 MG disintegrating tablet DISSOLVE 1 TABLET(4 MG) ON THE TONGUE EVERY 6 HOURS AS NEEDED FOR NAUSEA OR VOMITING   pantoprazole (PROTONIX) 40 MG tablet Take 1 tablet (40 mg total) by mouth daily. Take 30-60 min before first meal of the day   sodium chloride 1 [name] tablet Take 1 [name] by mouth 4 (four) times daily.   UBRELVY 100 MG TABS Take 1-2 tablets by mouth 2 (two) times daily as needed.   XARELTO 20 MG TABS tablet TAKE 1 TABLET DAILY WITH SUPPER     Review of Systems      All other systems reviewed and are otherwise negative except as noted above.  Physical Exam    VS:  BP 118/70   Pulse 80   Ht 5\' 6"  (1.676 m)   Wt 114 lb (51.7 kg)   SpO2 98%   BMI 18.40 kg/m  , BMI Body mass index is 18.4 kg/m.  Wt Readings from Last 3 Encounters:  08/22/22 114 lb (51.7 kg)  07/25/22 113 lb 6.4 oz (51.4 kg)  07/19/22 114 lb (51.7 kg)     GEN: Thin appearing, well developed, in no acute distress. HEENT: normal. Neck: Supple, no JVD, carotid bruits, or masses. Cardiac:RRR, no murmurs, rubs, or gallops. No clubbing, cyanosis, edema.  Radials/PT 2+ and equal bilaterally.  Respiratory:  Respirations regular and unlabored, clear to auscultation bilaterally. GI: Soft, nontender, nondistended. MS: No deformity or atrophy. Skin: Warm and dry, no rash. Neuro:  Strength and sensation are intact. Psych: Normal affect.  Assessment &  Plan    Paroxysmal atrial fibrillation -NSR today, some skipped beats  -having more PVCs that are symptomatic -We have offered atenolol and carvedilol, but ultimately patient wanted to go back on metoprolol -She is currently on metoprolol succinate twice a day 12 and half milligrams and metoprolol to tartrate for breakthrough palpitations -Blood pressure is well-controlle  Atypical atrial flutter/PVCs Continue current medications which include amlodipine at 2.5 mg daily we, Xarelto 20 mg daily, metoprolol succinate 12.5 mg twice a day, metoprolol tartrate 12.5 mg as needed for palpitations  Hypertension  -she has not needed to take her hydralazine -continue current medications  Chronic hyponatremia -on salt tabs per PCP and recent sodium was in [name] (138) -Recheck sodium next week and potentially drop her amlodipine so that we can further titrate her metoprolol for better control of her palpitations        Disposition: Follow up ASAP with Alicia. Lalla [name], or EP APP Signed, Sharlene Dory, PA-C 08/22/2022, 5:30 PM [facility]

## 2022-08-30 ENCOUNTER — Other Ambulatory Visit: Payer: Medicare Other

## 2022-08-30 DIAGNOSIS — E871 Hypo-osmolality and hyponatremia: Secondary | ICD-10-CM

## 2022-08-31 LAB — BASIC METABOLIC PANEL WITH GFR
BUN: 25 mg/dL (ref 7–25)
CO2: 29 mmol/L (ref 20–32)
Calcium: 9.6 mg/dL (ref 8.6–10.4)
Chloride: 97 mmol/L — ABNORMAL LOW (ref 98–110)
Creat: 0.61 mg/dL (ref 0.60–1.00)
Glucose, Bld: 101 mg/dL — ABNORMAL HIGH (ref 65–99)
Potassium: 5 mmol/L (ref 3.5–5.3)
Sodium: 134 mmol/L — ABNORMAL LOW (ref 135–146)
eGFR: 92 mL/min/{1.73_m2} (ref >=60)

## 2022-09-08 NOTE — Progress Notes (Signed)
Carelink Summary Report / Loop Recorder 

## 2022-09-13 NOTE — Progress Notes (Signed)
Electrophysiology Office Follow up Visit Note:    Date:  09/14/2022   ID:  Alicia [name], DOB 09-Mar-1945, MRN [id number]  PCP:  [name], NP  [facility] Cardiologist:  Meriam Sprague, MD  [facility] Electrophysiologist:  Lanier Prude, MD    Interval History:    Alicia [name] is a 77 y.o. female who presents for a follow up visit.   Last seen December 09, 2021 for her paroxysmal atrial fibrillation and PVCs.  She has a loop recorder.  She was previously followed by Dr. Johney Frame.  She had an ablation for her atrial fibrillation and flutter on June 17, 2020.  At the time of my last appointment she had no recurrence of her atrial fibrillation or flutter.  She was on Xarelto for stroke prophylaxis.  She saw Julian Hy in clinic August 22, 2022.  Heart monitor showed 4% burden of PVCs.  Today, she reports struggling with recurring fatigue, borderline low sodium levels, intermittent palpitations, trouble sleeping. Every day she is taking her 1/2 tablet of amlodipine around 5-6 AM, a couple hours later she takes 1/2 tablet of metoprolol succinate (12.5 mg). Sometimes her PVC's are very severe, requiring a dose of the metoprolol tartrate as well. Either during the day or throughout the day, she experiences palpitations that "feels like her heart turns over", associated with the sensation of her blood draining from her head.  She frequently uses the rollator to help steady herself.  At night, she also has episodes of very hard palpitations that makes it feel like the bed is shaking. At times her heart flops and subsequently feels like it races. She complains of daytime somnolence.  When she feels this way she checks her heart rate and they are normal.  Also she confirms having a sudden hypertensive episode in January. She continues to take 4 sodium chloride tablets a day most of the time, sometimes 3. She struggles with balancing her sodium and her blood pressure. Mostly her  sodium seems to be averaging 133-134.  She denies any chest pain, peripheral edema, headaches, orthopnea, or PND.     Past medical, surgical, social and family history were reviewed.  ROS:  Please see the history of present illness.  All other systems reviewed and are negative. (+) Palpitations (+) Dizziness (+) Fatigue (+) Daytime somnolence   EKGs/Labs/Other Studies Reviewed:    The following studies were reviewed today:  August 21, 2022 loop recorder interrogation showed a PVC burden of 6.2%.  No A-fib.  April 06, 2020 echo showed a EF of 60 to 65%      Physical Exam:    VS:  BP (!) 144/70   Pulse 84   Ht 5\' 6"  (1.676 m)   Wt 112 lb (50.8 kg)   SpO2 98%   BMI 18.08 kg/m     Wt Readings from Last 3 Encounters:  09/14/22 112 lb (50.8 kg)  08/22/22 114 lb (51.7 kg)  07/25/22 113 lb 6.4 oz (51.4 kg)     GEN:  Well nourished, well developed in no acute distress.  Anxious CARDIAC: RRR, no murmurs, rubs, gallops.  ILR pocket well healed. RESPIRATORY:  Clear to auscultation without rales, wheezing or rhonchi       ASSESSMENT:    1. PVC (premature ventricular contraction)   2. Paroxysmal atrial fibrillation (HCC)   3. Typical atrial flutter (HCC)   4. Hypertension, unspecified type    PLAN:    In order of problems listed  above:  #Paroxysmal atrial fibrillation and flutter Doing well after her catheter ablation in 2022.  No recurrence on recent loop recorder interrogation. Continue Xarelto for stroke prophylaxis  #PVCs Burden 6% on recent loop recorder interrogation.  Today her burden is 3%.  We had a long discussion about PVCs.  We discussed how the burden of PVCs will fluctuate month-to-month, day today.  She is not having high enough burden of PVCs to consider an antiarrhythmic drug and their associated off target effects. Has not tolerated higher dose beta-blockers in the past. I discussed treatment options for her symptomatic PVCs.  She is a poor  candidate for PVC ablation given low burden of PVCs, low body weight and advanced age.  She will continue with metoprolol succinate 12.5 mg by mouth twice daily.  I discussed antiarrhythmic drug therapy but I do not think the risks associated with these drugs are warranted at this time and I suspect she will not tolerate them given her history.  #Hypertension Above goal today.  Recommend checking blood pressures 1-2 times per week at home and recording the values.  Recommend bringing these recordings to the primary care physician.   Follow-up with EP APP in 1 year.     I,Mathew Stumpf,acting as a Neurosurgeon for Lanier Prude, MD.,have documented all relevant documentation on the behalf of Lanier Prude, MD,as directed by  Lanier Prude, MD while in the presence of Lanier Prude, MD.  I, Lanier Prude, MD, have reviewed all documentation for this visit. The documentation on 09/14/22 for the exam, diagnosis, procedures, and orders are all accurate and complete.   Signed, Steffanie Dunn, MD, [facility], [facility] 09/14/2022 9:56 AM    Electrophysiology [facility]

## 2022-09-13 NOTE — Progress Notes (Unsigned)
  Electrophysiology Office Follow up Visit Note:    Date:  09/13/2022   ID:  Alicia [name], DOB 1945/08/07, MRN [id number]  PCP:  [name], NP  [facility] Cardiologist:  Meriam Sprague, MD  [facility] Electrophysiologist:  Lanier Prude, MD    Interval History:    Alicia [name] is a 77 y.o. female who presents for a follow up visit.   Last seen December 09, 2021 for her paroxysmal atrial fibrillation and PVCs.  She has a loop recorder.  She was previously followed by Dr. Johney [name].  She had an ablation for her atrial fibrillation and flutter on June 17, 2020.  At the time of my last appointment she had no recurrence of her atrial fibrillation or flutter.  She was on Xarelto for stroke prophylaxis.  She saw Alicia [name] in clinic August 22, 2022.  Heart monitor showed 4% burden of PVCs.     Past medical, surgical, social and family history were reviewed.  ROS:   Please see the history of present illness.    All other systems reviewed and are negative.  EKGs/Labs/Other Studies Reviewed:    The following studies were reviewed today:  August 21, 2022 loop recorder interrogation showed a PVC burden of 6.2%.  No A-fib.  April 06, 2020 echo showed a EF of 60 to 65%      Physical Exam:    VS:  There were no vitals taken for this visit.    Wt Readings from Last 3 Encounters:  08/22/22 114 lb (51.7 kg)  07/25/22 113 lb 6.4 oz (51.4 kg)  07/19/22 114 lb (51.7 kg)     GEN: *** Well nourished, well developed in no acute distress CARDIAC: ***RRR, no murmurs, rubs, gallops RESPIRATORY:  Clear to auscultation without rales, wheezing or rhonchi       ASSESSMENT:    No diagnosis found. PLAN:    In order of problems listed above:  #Paroxysmal atrial fibrillation and flutter Doing well after her catheter ablation in 2022.  No recurrence on recent loop recorder interrogation. Continue Xarelto for stroke prophylaxis  #PVCs Burden 6% on recent loop  recorder interrogation Has not tolerated higher dose beta-blockers in the past. I discussed treatment options for her symptomatic PVCs.  She is a poor candidate for PVC ablation given low burden of PVCs, low body weight and advanced age.  I have recommended increasing her magnesium to 400 mg by mouth daily.  She will continue with metoprolol succinate 12.5 mg by mouth twice daily.  I discussed antiarrhythmic drug therapy but I do not think the risks associated with these drugs are warranted at this time and I suspect she will not tolerate them given her history.  ***Discussed amiodarone  #Hypertension *** goal today.  Recommend checking blood pressures 1-2 times per week at home and recording the values.  Recommend bringing these recordings to the primary care physician.   Follow-up with EP APP in 1 year.     Signed, Steffanie Dunn, MD, [facility], [facility] 09/13/2022 10:17 AM    Electrophysiology [facility]

## 2022-09-14 ENCOUNTER — Encounter: Payer: Self-pay | Admitting: Nurse Practitioner

## 2022-09-14 ENCOUNTER — Ambulatory Visit: Payer: Medicare Other | Attending: Cardiology | Admitting: Cardiology

## 2022-09-14 ENCOUNTER — Encounter: Payer: Self-pay | Admitting: Cardiology

## 2022-09-14 VITALS — BP 144/70 | HR 84 | Ht 66.0 in | Wt 112.0 lb

## 2022-09-14 DIAGNOSIS — I483 Typical atrial flutter: Secondary | ICD-10-CM | POA: Diagnosis not present

## 2022-09-14 DIAGNOSIS — I10 Essential (primary) hypertension: Secondary | ICD-10-CM | POA: Diagnosis not present

## 2022-09-14 DIAGNOSIS — I480 Paroxysmal atrial fibrillation: Secondary | ICD-10-CM | POA: Insufficient documentation

## 2022-09-14 DIAGNOSIS — I493 Ventricular premature depolarization: Secondary | ICD-10-CM | POA: Insufficient documentation

## 2022-09-14 NOTE — Patient Instructions (Signed)
Medication Instructions:  Your physician recommends that you continue on your current medications as directed. Please refer to the Current Medication list given to you today. *If you need a refill on your cardiac medications before your next appointment, please call your pharmacy*   Follow-Up: At [facility], you and your health needs are our priority.  As part of our continuing mission to provide you with exceptional heart care, we have created designated Provider Care Teams.  These Care Teams include your primary Cardiologist (physician) and Advanced Practice Providers (APPs -  Physician Assistants and Nurse Practitioners) who all work together to provide you with the care you need, when you need it.  We recommend signing up for the patient portal called "MyChart".  Sign up information is provided on this After Visit Summary.  MyChart is used to connect with patients for Virtual Visits (Telemedicine).  Patients are able to view lab/test results, encounter notes, upcoming appointments, etc.  Non-urgent messages can be sent to your provider as well.   To learn more about what you can do with MyChart, go to [email or web address].    Your next appointment:   1 year(s)  Provider:   You will see one of the following Advanced Practice Providers on your designated Care Team:   Francis Dowse, [place] [address]" [place], [place] Canary Brim, NP

## 2022-09-25 ENCOUNTER — Ambulatory Visit (INDEPENDENT_AMBULATORY_CARE_PROVIDER_SITE_OTHER): Payer: Medicare Other

## 2022-09-25 DIAGNOSIS — I480 Paroxysmal atrial fibrillation: Secondary | ICD-10-CM

## 2022-09-25 LAB — CUP PACEART REMOTE DEVICE CHECK
Date Time Interrogation Session: 20240719230304
Implantable Pulse Generator Implant Date: 20221111

## 2022-10-02 NOTE — Progress Notes (Unsigned)
Referring:  Edythe Lynn, MD [address] [place],  [place] [address]  PCP: [name], NP  Neurology was asked to evaluate Alicia [name], a 77 year old female for a chief complaint of headaches.  Our recommendations of care will be communicated by shared medical record.    CC:  headaches  History provided from self  HPI:  Medical co-morbidities: pAfib s/p ablation, peripheral neuropathy, limb-girdle muscular dystrophy, chronic hyponatremia  The patient presents for evaluation of migraines which began ~30 years ago. Migraines are described as left frontal pain with associated photophobia, phonophobia, nausea, vomiting, and vertigo. Headache frequency is highly variable, can have several  in one week or be headache free for 1 month. Most recently she had 3 migraines in the last week.  She previously took Maxalt for migraine rescue which worked well for her, but she had to stop it in 2022 due to palpitations. She is now taking Fioricet and Ubrelvy as needed. Bernita Raisin works about 25% of the time and she often has to take a second dose. She has tried Nurtec previously but found this ineffective.  She would like to start a preventive, but has struggled to find an effective preventive that does not interfere with her multiple medical issues.   Headache History: Onset: 1990s Triggers: weather changes Aura: none Location: left frontal Associated Symptoms:  Photophobia: yes  Phonophobia: yes  Nausea: yes Vomiting: yes Worse with activity?: yes Duration of headaches: several hours  Migraine days per month: 3 Headache free days per month: 27  Current Treatment: Abortive Ubrelvy 100 mg PRN Fioricet  Preventative none  Prior Therapies                                 Prevention: Atenolol Metoprolol 25 mg BID Amlodipine Depakote 500 mg QHS Gabapentin 200 mg at bedtime Cymbalta - developed hyponatremia after 1st dose Would avoid TCAs for now due to  uncontrolled palpitations Would avoid Topamax as patient is underweight  Rescue: Baclofen Robaxin Zofran Ubrelvy 100 mg PRN Nurtec 75 mg PRN Maxalt 10 mg PRN Imitrex 50 mg PRN Fioricet  LABS: CBC    Component Value Date/Time   WBC 6.3 04/01/2022 0006   RBC 4.17 04/01/2022 0006   HGB 12.7 04/01/2022 0006   HGB 14.3 10/06/2021 1149   HCT 38.5 04/01/2022 0006   HCT 42.3 10/06/2021 1149   PLT 242 04/01/2022 0006   PLT 319 10/06/2021 1149   MCV 92.3 04/01/2022 0006   MCV 91 10/06/2021 1149   MCH 30.5 04/01/2022 0006   MCHC 33.0 04/01/2022 0006   RDW 13.2 04/01/2022 0006   RDW 12.2 10/06/2021 1149   LYMPHSABS 0.5 (L) 04/01/2022 0006   LYMPHSABS 0.7 10/06/2021 1149   MONOABS 0.5 04/01/2022 0006   EOSABS 0.0 04/01/2022 0006   EOSABS 0.1 10/06/2021 1149   BASOSABS 0.0 04/01/2022 0006   BASOSABS 0.0 10/06/2021 1149      Latest Ref Rng & Units 08/30/2022    3:08 PM 07/17/2022    3:14 PM 04/01/2022   12:06 AM  CMP  Glucose 65 - 99 mg/dL 540  981  191   BUN 7 - 25 mg/dL 25  27  21    Creatinine 0.60 - 1.00 mg/dL 4.78  2.95  6.21   Sodium 135 - 146 mmol/L 134  138  128   Potassium 3.5 - 5.3 mmol/L 5.0  4.9  4.2   Chloride 98 - 110 mmol/L 97  101  92   CO2 20 - 32 mmol/L 29  31  28    Calcium 8.6 - 10.4 mg/dL 9.6  9.7  9.2      IMAGING:  MRI IAC: 05/05/19: IMPRESSION: 1. Auditory system is unremarkable. Visualized cranial nerves within normal limits. 2. Mild leukoaraiosis with moderate pontine gliosis, most likely due to chronic microvascular disease. 3. No acute intracranial abnormality    Current Outpatient Medications on File Prior to Visit  Medication Sig Dispense Refill   acetaminophen (TYLENOL) 500 MG tablet Take 500 mg by mouth at bedtime as needed for moderate pain.     amLODipine (NORVASC) 5 MG tablet Take 1 tablet (5 mg total) by mouth daily. (Patient taking differently: Take 2.5 mg by mouth daily. Take 1/2 tab po daily) 90 tablet 1   baclofen (LIORESAL) 10 MG  tablet Take 10 mg by mouth at bedtime.     butalbital-acetaminophen-caffeine (FIORICET) 50-325-40 MG tablet Take 1 tablet by mouth every 6 (six) hours as needed for migraine. 30 tablet 5   Calcium Citrate-Vitamin D (CALCIUM + D PO) Take 1 tablet by mouth as directed. 3-4 times daily     Carboxymethylcellulose Sodium (THERATEARS) 0.25 % SOLN Place 1 drop into both eyes 4 (four) times daily.     chlorpheniramine (CHLOR-TRIMETON) 4 MG tablet Take 4-8 mg by mouth at bedtime.     Cholecalciferol (VITAMIN D3) LIQD Take 1,000 Units by mouth daily.     Evening Primrose Oil 1000 MG CAPS Take 1,000 mg by mouth every evening.     famotidine (PEPCID) 20 MG tablet Take 20 mg by mouth daily.     FEMRING 0.05 MG/24HR RING Place 1 each vaginally every 3 (three) months.     hydrALAZINE (APRESOLINE) 25 MG tablet Take 1 tablet (25 mg total) by mouth 3 (three) times daily as needed (for systolic blood pressure greater than 150). 90 tablet 3   Lutein 20 MG TABS Take 20 mg by mouth daily.      MAGNESIUM CITRATE PO Take 300 mg by mouth daily.     meclizine (ANTIVERT) 12.5 MG tablet TAKE 1 TO 2 TABLETS(12.5 TO 25 MG) BY MOUTH THREE TIMES DAILY AS NEEDED FOR DIZZINESS 60 tablet 1   methocarbamol (ROBAXIN) 750 MG tablet Take 750 mg by mouth every 6 (six) hours.     metoprolol succinate (TOPROL XL) 25 MG 24 hr tablet Take 0.5 tablets (12.5 mg total) by mouth 2 (two) times daily. 90 tablet 3   metoprolol tartrate (LOPRESSOR) 25 MG tablet Take 0.5 tablets (12.5 mg total) by mouth as needed (for breakthrough palpitations). 45 tablet 3   Multiple Vitamin (MULTIVITAMIN WITH MINERALS) TABS tablet Take 1 tablet by mouth daily.     Multiple Vitamins-Minerals (PRESERVISION AREDS 2) CAPS Take 1 capsule by mouth 2 (two) times daily.     ondansetron (ZOFRAN-ODT) 4 MG disintegrating tablet DISSOLVE 1 TABLET(4 MG) ON THE TONGUE EVERY 6 HOURS AS NEEDED FOR NAUSEA OR VOMITING 60 tablet 1   sodium chloride 1 g tablet Take 1 g by mouth 4  (four) times daily.     XARELTO 20 MG TABS tablet TAKE 1 TABLET DAILY WITH SUPPER 90 tablet 3   Omega-3 1000 MG CAPS Take 1,000 mg by mouth 2 (two) times daily. (Patient not taking: Reported on 10/04/2022)     pantoprazole (PROTONIX) 40 MG tablet Take 1 tablet (40 mg total) by mouth daily. Take 30-60  min before first meal of the day 90 tablet 1   No current facility-administered medications on file prior to visit.     Allergies: Allergies  Allergen Reactions   Biaxin [Clarithromycin] Anaphylaxis   Sulfonamide Derivatives Itching and Rash   Tequin [Gatifloxacin] Anaphylaxis   Demerol Nausea And Vomiting   Lactose Intolerance (Gi) Other (See Comments)    Mild abdominal pain, gas, bloating   Nsaids Other (See Comments)    GI upset/stomach pain   Risedronate Sodium Nausea And Vomiting and Other (See Comments)     GI upset/heartburn    Rofecoxib Nausea And Vomiting and Other (See Comments)    GI upset/heartburn    Alendronate Sodium Nausea And Vomiting   Aspirin Other (See Comments)    Abdominal pain    Cardizem [Diltiazem Hcl] Hives   Eliquis [Apixaban] Hives   Fish Allergy Nausea And Vomiting    Can take fish oil   Benadryl [Diphenhydramine Hcl] Palpitations   Other Itching and Rash    EKG LEADS caused burning sores     Family History: Family History  Problem Relation Age of Onset   Alcohol abuse Mother    Hypertension Mother    Diabetes Mother 51   Stroke Mother 28   Dementia Mother    Kidney failure Mother    Osteoporosis Mother    Alzheimer's disease Father 43   Cancer Maternal Aunt 23       pancreatic   Arthritis Sister 38       knee replacement   Hyperlipidemia Sister    Lung disease Maternal Grandmother    Lung disease Maternal Grandfather    Hyperlipidemia Sister 14   Other Son        recent back injury   Other Daughter        recent back injury; knee issues when playing tennis   Heart disease Neg Hx    Heart attack Neg Hx      Past Medical  History: 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   Chronic migraine without aura, intractable, without status migrainosus    Diverticulosis 2010   Endometriosis    Low sodium levels    Malnutrition (HCC)    MD (muscular dystrophy) (HCC)    Meniere's disease, bilateral    [facility]    [facility]   Peripheral neuropathy    Dr Sandria Manly   Pneumonia    Sensorineural hearing loss, bilateral    Vertigo 2019    Past Surgical History Past Surgical History:  Procedure Laterality Date   ABDOMINAL HYSTERECTOMY  1980 or 1981   for Endometriosis   APPENDECTOMY  1965   high school; low grade appendiceal cancer   ATRIAL FIBRILLATION ABLATION N/A 06/17/2020   Procedure: ATRIAL FIBRILLATION ABLATION;  Surgeon: Hillis Range, MD;  Location: [facility] INVASIVE CV LAB;  Service: Cardiovascular;  Laterality: N/A;   BREAST ENHANCEMENT SURGERY Bilateral 1979   COLONOSCOPY  01/2012   Tics; Dr Dorena Cookey   ESI      X 3 in 2003 & 02/17/2011 for L4-S1 symptoms   implantable loop recorder placement  01/14/2021   Medtronic Reveal Linq model LNQ 22 ([place] [id number]) implantable loop recorder   LUMBAR LAMINECTOMY  12/2012   [facility] , [place]   TONSILLECTOMY  1950 or 1951   TUBAL LIGATION     vocal cords stripped  1970    Social History: Social History  Tobacco Use   Smoking status: Never   Smokeless tobacco: Never  Vaping Use   Vaping status: Never Used  Substance Use Topics   Alcohol use: Not Currently   Drug use: Never    ROS: Negative for fevers, chills. Positive for headaches. All other systems reviewed and negative unless stated otherwise in HPI.   Physical Exam:   Vital Signs: BP 134/67 (BP Location: Left Arm, Patient Position: Sitting, Cuff Size: Normal)   Pulse 77   Ht 5\' 6"  (1.676 m)   Wt 110 lb 3.2 oz (50 kg)   BMI 17.79 kg/m  GENERAL: well appearing,in no acute distress,alert SKIN:  Color, texture, turgor normal. No rashes or  lesions HEAD:  Normocephalic/atraumatic. CV:  RRR RESP: Normal respiratory effort MSK: no tenderness to palpation over occiput, neck, or shoulders  NEUROLOGICAL: Mental Status: Alert, oriented to person, place and time,Follows commands Cranial Nerves: PERRL, visual fields intact to confrontation, extraocular movements intact, facial sensation intact, no facial droop or ptosis, hearing grossly intact, no dysarthria Motor: muscle strength 4+/5 bilateral arm abduction, 4+/5 bilateral hip flexion, otherwise 5/5 both upper and lower extremities Reflexes: 2+ throughout Sensation: intact to light touch all 4 extremities Coordination: Finger-to- nose-finger intact bilaterally Gait: normal-based   IMPRESSION: 77 year old female with a history of pAfib s/p ablation, peripheral neuropathy, limb-girdle muscular dystrophy, chronic hyponatremia who presents for evaluation of migraines. She has been unable to tolerate triptans due to palpitations and Ubrelvy/Nurtec have been relatively ineffective. Will try Zavzpret for migraine rescue. Emgality prescribed for migraine prevention.  PLAN: -Prevention: Start Emgality 120 mg monthly. Loading dose sample provided today -Rescue: Start Zavzpret for migraine rescue.  I spent a total of 61 minutes chart reviewing and counseling the patient. Headache education was done. Discussed treatment options including preventive and acute medications. Discussed medication side effects, adverse reactions and drug interactions. Written educational materials and patient instructions outlining all of the above were given.  Follow-up: 4 months   Ocie Doyne, MD 10/04/2022   4:04 PM

## 2022-10-04 ENCOUNTER — Encounter: Payer: Self-pay | Admitting: Psychiatry

## 2022-10-04 ENCOUNTER — Ambulatory Visit: Payer: Medicare Other | Admitting: Psychiatry

## 2022-10-04 VITALS — BP 134/67 | HR 77 | Ht 66.0 in | Wt 110.2 lb

## 2022-10-04 DIAGNOSIS — G43019 Migraine without aura, intractable, without status migrainosus: Secondary | ICD-10-CM | POA: Diagnosis not present

## 2022-10-04 MED ORDER — EMGALITY 120 MG/ML ~~LOC~~ SOAJ: 2.0000 | Freq: Once | SUBCUTANEOUS | 0 refills | Status: DC

## 2022-10-04 MED ORDER — EMGALITY 120 MG/ML ~~LOC~~ SOAJ: 2.0000 | Freq: Once | SUBCUTANEOUS | Status: AC

## 2022-10-04 MED ORDER — EMGALITY 120 MG/ML ~~LOC~~ SOAJ: 1.0000 | SUBCUTANEOUS | 6 refills | Status: DC

## 2022-10-04 MED ORDER — ZAVZPRET 10 MG/ACT NA SOLN: 10.0000 mg | NASAL | 6 refills | Status: DC | PRN

## 2022-10-05 ENCOUNTER — Encounter (INDEPENDENT_AMBULATORY_CARE_PROVIDER_SITE_OTHER): Payer: Medicare Other | Admitting: Psychiatry

## 2022-10-05 DIAGNOSIS — G43019 Migraine without aura, intractable, without status migrainosus: Secondary | ICD-10-CM

## 2022-10-05 NOTE — Progress Notes (Signed)
Carelink Summary Report / Loop Recorder 

## 2022-10-06 ENCOUNTER — Emergency Department (HOSPITAL_BASED_OUTPATIENT_CLINIC_OR_DEPARTMENT_OTHER)
Admission: EM | Admit: 2022-10-06 | Discharge: 2022-10-06 | Disposition: A | Discharge status: Home / Self Care | Payer: Medicare Other | Source: Home / Self Care | Attending: Emergency Medicine | Admitting: Emergency Medicine

## 2022-10-06 ENCOUNTER — Emergency Department (HOSPITAL_BASED_OUTPATIENT_CLINIC_OR_DEPARTMENT_OTHER): Payer: Medicare Other

## 2022-10-06 ENCOUNTER — Other Ambulatory Visit: Payer: Self-pay

## 2022-10-06 DIAGNOSIS — I10 Essential (primary) hypertension: Secondary | ICD-10-CM | POA: Diagnosis not present

## 2022-10-06 DIAGNOSIS — M25572 Pain in left ankle and joints of left foot: Secondary | ICD-10-CM | POA: Diagnosis not present

## 2022-10-06 DIAGNOSIS — M25562 Pain in left knee: Secondary | ICD-10-CM | POA: Insufficient documentation

## 2022-10-06 DIAGNOSIS — R1110 Vomiting, unspecified: Secondary | ICD-10-CM | POA: Insufficient documentation

## 2022-10-06 DIAGNOSIS — W19XXXA Unspecified fall, initial encounter: Secondary | ICD-10-CM | POA: Insufficient documentation

## 2022-10-06 DIAGNOSIS — R55 Syncope and collapse: Secondary | ICD-10-CM | POA: Insufficient documentation

## 2022-10-06 DIAGNOSIS — Z7901 Long term (current) use of anticoagulants: Secondary | ICD-10-CM | POA: Insufficient documentation

## 2022-10-06 DIAGNOSIS — I4891 Unspecified atrial fibrillation: Secondary | ICD-10-CM | POA: Diagnosis not present

## 2022-10-06 DIAGNOSIS — M79672 Pain in left foot: Secondary | ICD-10-CM | POA: Diagnosis not present

## 2022-10-06 DIAGNOSIS — S0990XA Unspecified injury of head, initial encounter: Secondary | ICD-10-CM | POA: Diagnosis not present

## 2022-10-06 DIAGNOSIS — S199XXA Unspecified injury of neck, initial encounter: Secondary | ICD-10-CM | POA: Diagnosis not present

## 2022-10-06 DIAGNOSIS — M2012 Hallux valgus (acquired), left foot: Secondary | ICD-10-CM | POA: Diagnosis not present

## 2022-10-06 LAB — CBG MONITORING, ED: Glucose-Capillary: 73 mg/dL (ref 70–99)

## 2022-10-06 NOTE — Discharge Instructions (Signed)
Was a pleasure caring for you today.  I recommend following up with your primary care provider early next week.  Seek emergency care if experiencing any new or worsening symptoms.

## 2022-10-06 NOTE — ED Triage Notes (Addendum)
Pt to ED from UC c/o unwitnessed fall that occurred yesterday around noon. Pt reports she was sitting in a chair, administering a new medication to each of her thighs that was prescribed by her neurologist. (Emgality)She states after the second injection, she reports passed out and fell, LOC < 1 min , Husband was in the home. Pt takes blood thinners -Xerlto  . Pt reports  Left foot pain, left knee pain and left ankle pain . Unknown if she hit head . Pt present with ortho boot in triage

## 2022-10-06 NOTE — ED Notes (Signed)
Reviewed AVS with patient, patient expressed understanding of directions, denies further questions at this time. 

## 2022-10-06 NOTE — ED Provider Notes (Signed)
[place] EMERGENCY DEPARTMENT AT [facility] Provider Note   CSN: [id number] Arrival date & time: 10/06/22  1900     History  Chief Complaint  Patient presents with   Fall    Blood thinner   Loss of Consciousness    Alicia [name] is a 77 y.o. female with PMHx migraine, muscular dystrophy who presents to ED after syncopal episode x1 yesterday. Patient stating that she passed out while injecting her medicine [employer]). Patient's husband was downstairs and heard patient's fall and came upstairs to see patient passed out on the floor. Husband states that patient was awake within a few seconds and confusion resided within a couple of minutes. Patient also had 1 episode of vomiting after syncopal episode. Husband stating that patient was back to normal except for left foot and left knee pain. They went to [facility] for foot xray but referred to ED when UC learned that patient had LOC and is on blood thinners.   Denies fever, chest pain, dyspnea, abdominal pain, nausea, vomiting, dysuria, diarrhea.   Fall  Loss of Consciousness      Home Medications Prior to Admission medications   Medication Sig Start Date End Date Taking? Authorizing Provider  acetaminophen (TYLENOL) 500 MG tablet Take 500 mg by mouth at bedtime as needed for moderate pain.    [provider]  amLODipine (NORVASC) 5 MG tablet Take 1 tablet (5 mg total) by mouth daily. Patient taking differently: Take 2.5 mg by mouth daily. Take 1/2 tab po daily 04/20/22   Meriam Sprague, MD  baclofen (LIORESAL) 10 MG tablet Take 10 mg by mouth at bedtime.    [provider]  butalbital-acetaminophen-caffeine (FIORICET) 50-325-40 MG tablet Take 1 tablet by mouth every 6 (six) hours as needed for migraine. 08/04/21   [name], NP  Calcium Citrate-Vitamin D (CALCIUM + D PO) Take 1 tablet by mouth as directed. 3-4 times daily    [provider]  Carboxymethylcellulose Sodium (THERATEARS)  0.25 % SOLN Place 1 drop into both eyes 4 (four) times daily.    [provider]  chlorpheniramine (CHLOR-TRIMETON) 4 MG tablet Take 4-8 mg by mouth at bedtime.    [provider]  Cholecalciferol (VITAMIN D3) LIQD Take 1,000 Units by mouth daily.    [provider]  Evening Primrose Oil 1000 MG CAPS Take 1,000 mg by mouth every evening.    [provider]  famotidine (PEPCID) 20 MG tablet Take 20 mg by mouth daily.    [provider]  FEMRING 0.05 MG/24HR RING Place 1 each vaginally every 3 (three) months. 02/10/19   [provider]  hydrALAZINE (APRESOLINE) 25 MG tablet Take 1 tablet (25 mg total) by mouth 3 (three) times daily as needed (for systolic blood pressure greater than 150). 04/19/22   Meriam Sprague, MD  Lutein 20 MG TABS Take 20 mg by mouth daily.     [provider]  MAGNESIUM CITRATE PO Take 300 mg by mouth daily.    [provider]  meclizine (ANTIVERT) 12.5 MG tablet TAKE 1 TO 2 TABLETS(12.5 TO 25 MG) BY MOUTH THREE TIMES DAILY AS NEEDED FOR DIZZINESS 05/24/22   [name], NP  methocarbamol (ROBAXIN) 750 MG tablet Take 750 mg by mouth every 6 (six) hours.    [provider]  metoprolol succinate (TOPROL XL) 25 MG 24 hr tablet Take 0.5 tablets (12.5 mg total) by mouth 2 (two) times daily. 08/10/22   Asa Lente,  Tessa N, PA-C  metoprolol tartrate (LOPRESSOR) 25 MG tablet Take 0.5 tablets (12.5 mg total) by mouth as needed (for breakthrough palpitations). 08/10/22   Sharlene Dory, PA-C  Multiple Vitamin (MULTIVITAMIN WITH MINERALS) TABS tablet Take 1 tablet by mouth daily.    [provider]  Multiple Vitamins-Minerals (PRESERVISION AREDS 2) CAPS Take 1 capsule by mouth 2 (two) times daily.    [provider]  ondansetron (ZOFRAN-ODT) 4 MG disintegrating tablet DISSOLVE 1 TABLET(4 MG) ON THE TONGUE EVERY 6 HOURS AS NEEDED FOR NAUSEA OR VOMITING 10/28/21   [name], NP  sodium  chloride 1 g tablet Take 1 g by mouth 4 (four) times daily.    [provider]  XARELTO 20 MG TABS tablet TAKE 1 TABLET DAILY WITH SUPPER 03/30/22   Lanier Prude, MD  Zavegepant HCl (ZAVZPRET) 10 MG/ACT SOLN Place 10 mg into the nose as needed (for migraine). Max dose 1 spray in 24 hours 10/04/22   Ocie Doyne, MD      Allergies    Biaxin [clarithromycin], Emgality [galcanezumab-gnlm], Sulfonamide derivatives, Tequin [gatifloxacin], Demerol, Lactose intolerance (gi), Nsaids, Risedronate sodium, Rofecoxib, Alendronate sodium, Aspirin, Cardizem [diltiazem hcl], Eliquis [apixaban], Fish allergy, Benadryl [diphenhydramine hcl], and Other    Review of Systems   Review of Systems  Cardiovascular:  Positive for syncope.    Physical Exam Updated Vital Signs BP (!) 166/64   Pulse 70   Temp 98.3 F (36.8 C) (Oral)   Resp 17   Ht 5\' 6"  (1.676 m)   Wt 49.9 kg   SpO2 97%   BMI 17.75 kg/m  Physical Exam Vitals and nursing note reviewed.  Constitutional:      General: She is not in acute distress.    Appearance: She is not ill-appearing or toxic-appearing.  HENT:     Head: Normocephalic and atraumatic.     Mouth/Throat:     Mouth: Mucous membranes are moist.  Eyes:     General: No scleral icterus.       Right eye: No discharge.        Left eye: No discharge.     Conjunctiva/sclera: Conjunctivae normal.  Cardiovascular:     Rate and Rhythm: Normal rate and regular rhythm.     Pulses: Normal pulses.     Heart sounds: Normal heart sounds. No murmur heard. Pulmonary:     Effort: Pulmonary effort is normal. No respiratory distress.     Breath sounds: Normal breath sounds. No wheezing, rhonchi or rales.  Abdominal:     General: Abdomen is flat.  Musculoskeletal:        General: Normal range of motion.     Right lower leg: No edema.     Left lower leg: No edema.     Comments: +2 radial and pedal pulses. Knee and ankle ROM normal.  Skin:    General: Skin is warm and  dry.     Capillary Refill: Capillary refill takes less than 2 seconds.     Findings: No rash.  Neurological:     General: No focal deficit present.     Mental Status: She is alert and oriented to person, place, and time. Mental status is at baseline.     Comments: GCS 15. Speech is goal oriented. No deficits appreciated to CN III-XII; symmetric eyebrow raise, no facial drooping, tongue midline. Patient has equal grip strength bilaterally with 5/5 strength against resistance in all major muscle groups bilaterally. Sensation to light touch intact.  Patient moves extremities without ataxia. Patient ambulatory with steady gait.   Psychiatric:        Mood and Affect: Mood normal.        Behavior: Behavior normal.     ED Results / Procedures / Treatments   Labs (all labs ordered are listed, but only abnormal results are displayed) Labs Reviewed  CBC WITH DIFFERENTIAL/PLATELET  BASIC METABOLIC PANEL  CBG MONITORING, ED  CBG MONITORING, ED    EKG EKG Interpretation Date/Time:  Friday October 06 2022 19:21:50 EDT Ventricular Rate:  76 PR Interval:  150 QRS Duration:  82 QT Interval:  366 QTC Calculation: 411 R Axis:   78  Text Interpretation: Normal sinus rhythm Possible Left atrial enlargement Borderline ECG When compared with ECG of 01-Apr-2022 00:43, PREVIOUS ECG IS PRESENT Confirmed by Virgina Norfolk [phone]) on 10/06/2022 7:24:51 PM  Radiology DG Knee Complete 4 Views Left  Result Date: 10/06/2022 CLINICAL DATA:  Unwitnessed fall yesterday.  Left knee pain. EXAM: LEFT KNEE - COMPLETE 4+ VIEW COMPARISON:  None Available. FINDINGS: Mild medial compartment joint space narrowing. Minimal superior patellar degenerative spurring. No joint effusion. No acute fracture or dislocation. IMPRESSION: Mild medial compartment joint space narrowing. Electronically Signed   By: Neita Garnet M.D.   On: 10/06/2022 21:04   DG Ankle Complete Left  Result Date: 10/06/2022 CLINICAL DATA:  Unwitnessed fall  yesterday.  Pain. EXAM: LEFT ANKLE COMPLETE - 3+ VIEW COMPARISON:  None Available. FINDINGS: The ankle mortise is symmetric and intact. Joint spaces are preserved. No acute fracture or dislocation. IMPRESSION: Negative. Electronically Signed   By: Neita Garnet M.D.   On: 10/06/2022 21:03   DG Foot Complete Left  Result Date: 10/06/2022 CLINICAL DATA:  Foot pain.  Unwitnessed fall yesterday. EXAM: LEFT FOOT - COMPLETE 3+ VIEW COMPARISON:  Left foot radiographs 07/22/2003 FINDINGS: Mild hallux valgus with metatarsophalangeal angle measuring 28 degrees, increased from prior. Unchanged small chronic calcific density lateral to the distal aspect of the middle phalanx of the fourth toe. Mild great toe metatarsophalangeal head-sesamoid joint space narrowing. No acute fracture or dislocation. IMPRESSION: 1. No acute fracture or dislocation. 2. Mild hallux valgus. Electronically Signed   By: Neita Garnet M.D.   On: 10/06/2022 21:02   CT Head Wo Contrast  Result Date: 10/06/2022 CLINICAL DATA:  Head trauma, minor (Age >= 65y); Neck trauma (Age >= 65y) EXAM: CT HEAD WITHOUT CONTRAST CT CERVICAL SPINE WITHOUT CONTRAST TECHNIQUE: Multidetector CT imaging of the head and cervical spine was performed following the standard protocol without intravenous contrast. Multiplanar CT image reconstructions of the cervical spine were also generated. RADIATION DOSE REDUCTION: This exam was performed according to the departmental dose-optimization program which includes automated exposure control, adjustment of the mA and/or kV according to patient size and/or use of iterative reconstruction technique. COMPARISON:  CT high-resolution chest 01/16/2022 FINDINGS: CT HEAD FINDINGS Brain: Trace patchy and confluent areas of decreased attenuation are noted throughout the deep and periventricular white matter of the cerebral hemispheres bilaterally, compatible with chronic microvascular ischemic disease. No evidence of large-territorial  acute infarction. No parenchymal hemorrhage. No mass lesion. No extra-axial collection. No mass effect or midline shift. No hydrocephalus. Basilar cisterns are patent. Vascular: No hyperdense vessel. Skull: No acute fracture or focal lesion. Sinuses/Orbits: Paranasal sinuses and mastoid air cells are clear. The orbits are unremarkable. Other: None. CT CERVICAL SPINE FINDINGS Alignment: Normal. Skull base and vertebrae: Mild degenerative changes of the spine. No acute fracture. No aggressive appearing focal osseous  lesion or focal pathologic process. Soft tissues and spinal canal: No prevertebral fluid or swelling. No visible canal hematoma. Upper chest: Similar-appearing marked biapical pleural/pulmonary scarring with associated calcifications. Other: None. IMPRESSION: 1. No acute intracranial abnormality. 2. No acute displaced fracture or traumatic listhesis of the cervical spine. Electronically Signed   By: Tish Frederickson M.D.   On: 10/06/2022 20:06   CT Cervical Spine Wo Contrast  Result Date: 10/06/2022 CLINICAL DATA:  Head trauma, minor (Age >= 65y); Neck trauma (Age >= 65y) EXAM: CT HEAD WITHOUT CONTRAST CT CERVICAL SPINE WITHOUT CONTRAST TECHNIQUE: Multidetector CT imaging of the head and cervical spine was performed following the standard protocol without intravenous contrast. Multiplanar CT image reconstructions of the cervical spine were also generated. RADIATION DOSE REDUCTION: This exam was performed according to the departmental dose-optimization program which includes automated exposure control, adjustment of the mA and/or kV according to patient size and/or use of iterative reconstruction technique. COMPARISON:  CT high-resolution chest 01/16/2022 FINDINGS: CT HEAD FINDINGS Brain: Trace patchy and confluent areas of decreased attenuation are noted throughout the deep and periventricular white matter of the cerebral hemispheres bilaterally, compatible with chronic microvascular ischemic disease. No  evidence of large-territorial acute infarction. No parenchymal hemorrhage. No mass lesion. No extra-axial collection. No mass effect or midline shift. No hydrocephalus. Basilar cisterns are patent. Vascular: No hyperdense vessel. Skull: No acute fracture or focal lesion. Sinuses/Orbits: Paranasal sinuses and mastoid air cells are clear. The orbits are unremarkable. Other: None. CT CERVICAL SPINE FINDINGS Alignment: Normal. Skull base and vertebrae: Mild degenerative changes of the spine. No acute fracture. No aggressive appearing focal osseous lesion or focal pathologic process. Soft tissues and spinal canal: No prevertebral fluid or swelling. No visible canal hematoma. Upper chest: Similar-appearing marked biapical pleural/pulmonary scarring with associated calcifications. Other: None. IMPRESSION: 1. No acute intracranial abnormality. 2. No acute displaced fracture or traumatic listhesis of the cervical spine. Electronically Signed   By: Tish Frederickson M.D.   On: 10/06/2022 20:06    Procedures Procedures    Medications Ordered in ED Medications - No data to display  ED Course/ Medical Decision Making/ A&P Clinical Course as of 10/06/22 2232  [facility] Oct 06, 2022  2220 CT Cervical Spine Wo Contrast [SM]    Clinical Course User Index [SM] Dorthy Cooler, PA-C                                 Medical Decision Making Amount and/or Complexity of Data Reviewed Labs: ordered. Radiology: ordered.   This patient presents to the ED for concern of syncope, this involves an extensive number of treatment options, and is a complaint that carries with it a high risk of complications and morbidity.  The differential diagnosis includes CVA, ICH, intracranial mass, critical dehydration, endocrine abnormality, sepsis/infection, electrolyte abnormality, cardiac arrhythmia.   Co morbidities that complicate the patient evaluation  migraine, muscular dystrophy, blood thinners    Lab Tests:  I  Ordered, and personally interpreted labs.  The pertinent results include:   -CBG: within normal limits -BMP: patient refused -CBC: patient refused   Imaging Studies ordered:  I ordered imaging studies including  -CT head/cervical spine: To assess for complications s/p fall -X-ray left foot/ankle/knee: To assess for fractures and dislocations I independently visualized and interpreted imaging  I agree with the radiologist interpretation   Cardiac Monitoring: / EKG:  The patient was maintained on a cardiac monitor.  I personally viewed and interpreted the cardiac monitored which showed an underlying rhythm of: Sinus rhythm without acute ST changes or arrhythmias    Problem List / ED Course / Critical interventions / Medication management  Patient presents emergency room s/p fall yesterday.  Patient injected her new migraine medication when she immediately started to feel lightheaded and lost consciousness.  Patient on blood thinners.  Physical exam and neuroexam unremarkable.  CT head and cervical spine without acute abnormalities.  EKG reassuring. Xrays of foot, ankle, and knee without fracture or dislocation. During patient's ED stay, her labs were somehow lost and never made it to the [facility]. Patient stating that she does not want to wait any longer in the emergency room for her labs to result.  Educated patient that I wanted to obtain her labs to make sure there was nothing concerning that would have made her lose consciousness yesterday.  Patient stating that she knows that her loss of consciousness was due to her new medication that she injected.  Patient currently denying any symptoms except for that left knee pain and left foot pain.  Patient states that she would rather follow-up with her primary care provider. I have reviewed the patients home medicines and have made adjustments as needed Patient was given return precautions. Patient stable for discharge at this time.  Patient  verbalized understanding of plan.  Ddx: these are considered less likely due to history of present illness and physical exam -CVA/ICH/intracranial mass: CT reassuring -Sepsis/infection: SIRs criteria not met; patient afebrile without infectious symptoms  -Cardiac arrhythmia: EKG shows NSR without acute ST changes   Social Determinants of Health:  None         Final Clinical Impression(s) / ED Diagnoses Final diagnoses:  Fall, initial encounter    Rx / DC Orders ED Discharge Orders     None         Margarita Rana 10/06/22 2232    Virgina Norfolk, DO 10/06/22 2302

## 2022-10-09 MED ORDER — QULIPTA 30 MG PO TABS: 30.0000 mg | ORAL_TABLET | Freq: Every day | ORAL | 6 refills | Status: DC

## 2022-10-09 NOTE — Telephone Encounter (Signed)
Please see the MyChart message reply(ies) for my assessment and plan.    This patient gave consent for this Medical Advice Message and is aware that it may result in a bill to [employer], as well as the possibility of receiving a bill for a co-payment or deductible. They are an established patient, but are not seeking medical advice exclusively about a problem treated during an in person or video visit in the last seven days. I did not recommend an in person or video visit within seven days of my reply.    I spent a total of 5 minutes cumulative time within 7 days through [employer].  Ocie Doyne, MD  10/09/22 10:10 AM

## 2022-10-11 MED ORDER — QULIPTA 30 MG PO TABS: 30.0000 mg | ORAL_TABLET | Freq: Every day | ORAL | 0 refills | Status: DC

## 2022-10-11 NOTE — Addendum Note (Signed)
Addended by: Ocie Doyne on: 10/11/2022 12:53 PM   Modules accepted: Orders

## 2022-10-16 ENCOUNTER — Other Ambulatory Visit (HOSPITAL_COMMUNITY): Payer: Self-pay

## 2022-10-16 ENCOUNTER — Other Ambulatory Visit: Payer: Self-pay

## 2022-10-16 MED ORDER — AMLODIPINE BESYLATE 5 MG PO TABS: 5.0000 mg | ORAL_TABLET | Freq: Every day | ORAL | 3 refills | Status: DC

## 2022-10-18 ENCOUNTER — Telehealth: Payer: Self-pay

## 2022-10-18 ENCOUNTER — Other Ambulatory Visit (HOSPITAL_COMMUNITY): Payer: Self-pay

## 2022-10-18 NOTE — Telephone Encounter (Signed)
Pharmacy Patient Advocate Encounter  Received notification from [employer] that Prior Authorization for Zavzpret 10MG /ACT solution has been APPROVED from 10-18-2022 to 04-16-2023   PA #/Case ID/Reference #: [id number]

## 2022-10-18 NOTE — Telephone Encounter (Signed)
Pharmacy Patient Advocate Encounter  Received notification from [employer] that Prior Authorization for Qulipta 30MG  tablets has been APPROVED from 10-18-2022 to 04-16-2023   PA #/Case ID/Reference #: [facility]

## 2022-10-20 ENCOUNTER — Other Ambulatory Visit: Payer: Self-pay

## 2022-10-20 ENCOUNTER — Encounter: Payer: Medicare Other | Admitting: Nurse Practitioner

## 2022-10-20 ENCOUNTER — Telehealth: Payer: Self-pay

## 2022-10-20 DIAGNOSIS — R42 Dizziness and giddiness: Secondary | ICD-10-CM

## 2022-10-20 MED ORDER — MECLIZINE HCL 12.5 MG PO TABS
12.5000 mg | ORAL_TABLET | Freq: Three times a day (TID) | ORAL | 1 refills | Status: DC
Start: 2022-10-20 — End: 2023-02-06

## 2022-10-20 NOTE — Telephone Encounter (Signed)
Patient called stating that she had to cancel her appointment for today because she has vertigo. Patient would like to have meclizine 12 mg sent to pharmacy.

## 2022-10-20 NOTE — Progress Notes (Signed)
This encounter was created in error - please disregard.

## 2022-10-24 ENCOUNTER — Other Ambulatory Visit: Payer: Self-pay | Admitting: Nurse Practitioner

## 2022-10-24 DIAGNOSIS — C44319 Basal cell carcinoma of skin of other parts of face: Secondary | ICD-10-CM | POA: Diagnosis not present

## 2022-10-24 DIAGNOSIS — L820 Inflamed seborrheic keratosis: Secondary | ICD-10-CM | POA: Diagnosis not present

## 2022-10-24 DIAGNOSIS — C44719 Basal cell carcinoma of skin of left lower limb, including hip: Secondary | ICD-10-CM | POA: Diagnosis not present

## 2022-10-24 DIAGNOSIS — L570 Actinic keratosis: Secondary | ICD-10-CM | POA: Diagnosis not present

## 2022-10-24 DIAGNOSIS — G43111 Migraine with aura, intractable, with status migrainosus: Secondary | ICD-10-CM

## 2022-10-24 DIAGNOSIS — L821 Other seborrheic keratosis: Secondary | ICD-10-CM | POA: Diagnosis not present

## 2022-10-24 DIAGNOSIS — D224 Melanocytic nevi of scalp and neck: Secondary | ICD-10-CM | POA: Diagnosis not present

## 2022-10-24 DIAGNOSIS — D485 Neoplasm of uncertain behavior of skin: Secondary | ICD-10-CM | POA: Diagnosis not present

## 2022-10-24 DIAGNOSIS — Z85828 Personal history of other malignant neoplasm of skin: Secondary | ICD-10-CM | POA: Diagnosis not present

## 2022-10-24 NOTE — Telephone Encounter (Signed)
Patient has request refill on medication Fioricet. Patient medication last refilled 08/04/2021. Patient has No Contract on file. Patient has upcoming appointment 11/15/2022. Sign Contract added to patient appointment notes. Medication pend and sent to PCP Janyth Contes Janene Harvey, NP for approval.

## 2022-10-26 LAB — CUP PACEART REMOTE DEVICE CHECK
Date Time Interrogation Session: 20240821230055
Implantable Pulse Generator Implant Date: 20221111

## 2022-10-30 ENCOUNTER — Ambulatory Visit (INDEPENDENT_AMBULATORY_CARE_PROVIDER_SITE_OTHER): Payer: Medicare Other

## 2022-10-30 DIAGNOSIS — I480 Paroxysmal atrial fibrillation: Secondary | ICD-10-CM | POA: Diagnosis not present

## 2022-10-31 NOTE — Telephone Encounter (Signed)
Please see the MyChart message reply(ies) for my assessment and plan.    This patient gave consent for this Medical Advice Message and is aware that it may result in a bill to [employer], as well as the possibility of receiving a bill for a co-payment or deductible. They are an established patient, but are not seeking medical advice exclusively about a problem treated during an in person or video visit in the last seven days. I did not recommend an in person or video visit within seven days of my reply.    I spent a total of 5 minutes cumulative time within 7 days through [employer].  Ocie Doyne, MD  10/31/22 12:34 PM

## 2022-11-07 DIAGNOSIS — G7100 Muscular dystrophy, unspecified: Secondary | ICD-10-CM | POA: Diagnosis not present

## 2022-11-07 DIAGNOSIS — Z1330 Encounter for screening examination for mental health and behavioral disorders, unspecified: Secondary | ICD-10-CM | POA: Diagnosis not present

## 2022-11-07 DIAGNOSIS — Z7901 Long term (current) use of anticoagulants: Secondary | ICD-10-CM | POA: Diagnosis not present

## 2022-11-07 DIAGNOSIS — G43111 Migraine with aura, intractable, with status migrainosus: Secondary | ICD-10-CM | POA: Diagnosis not present

## 2022-11-08 NOTE — Progress Notes (Signed)
Carelink Summary Report / Loop Recorder 

## 2022-11-15 ENCOUNTER — Encounter: Payer: Self-pay | Admitting: Nurse Practitioner

## 2022-11-15 ENCOUNTER — Ambulatory Visit (INDEPENDENT_AMBULATORY_CARE_PROVIDER_SITE_OTHER): Payer: Medicare Other | Admitting: Nurse Practitioner

## 2022-11-15 VITALS — BP 119/88 | HR 82 | Temp 97.8°F | Resp 18 | Ht 66.0 in | Wt 117.6 lb

## 2022-11-15 DIAGNOSIS — I10 Essential (primary) hypertension: Secondary | ICD-10-CM | POA: Diagnosis not present

## 2022-11-15 DIAGNOSIS — K219 Gastro-esophageal reflux disease without esophagitis: Secondary | ICD-10-CM

## 2022-11-15 DIAGNOSIS — E871 Hypo-osmolality and hyponatremia: Secondary | ICD-10-CM

## 2022-11-15 DIAGNOSIS — E559 Vitamin D deficiency, unspecified: Secondary | ICD-10-CM

## 2022-11-15 DIAGNOSIS — I480 Paroxysmal atrial fibrillation: Secondary | ICD-10-CM

## 2022-11-15 DIAGNOSIS — G7100 Muscular dystrophy, unspecified: Secondary | ICD-10-CM

## 2022-11-15 DIAGNOSIS — Z23 Encounter for immunization: Secondary | ICD-10-CM | POA: Diagnosis not present

## 2022-11-15 DIAGNOSIS — G43111 Migraine with aura, intractable, with status migrainosus: Secondary | ICD-10-CM

## 2022-11-15 NOTE — Patient Instructions (Signed)
To make appt for lab later this week

## 2022-11-15 NOTE — Progress Notes (Signed)
Careteam: Patient Care Team: [name], NP as PCP - General (Geriatric Medicine) Lanier Prude, MD as PCP - Electrophysiology (Cardiology) Meriam Sprague, MD as PCP - Cardiology (Cardiology) Celso Amy, PA-C as Consulting Physician (Physician Assistant) Arminda Resides, MD as Consulting Physician (Dermatology) Osborn Coho, MD as Consulting Physician (Obstetrics and Gynecology) Ermalinda Barrios, MD as Consulting Physician (Otolaryngology) Chilton Greathouse, MD as Consulting Physician (Pulmonary Disease) Edythe Lynn, MD as Referring Physician (Neurology)  PLACE OF SERVICE:  [facility]  Advanced Directive information    Allergies  Allergen Reactions   Biaxin [Clarithromycin] Anaphylaxis   Emgality [Galcanezumab-Gnlm] Other (See Comments)    Dizziness/ n/v, passed out   Sulfonamide Derivatives Itching and Rash   Tequin [Gatifloxacin] Anaphylaxis   Demerol Nausea And Vomiting   Lactose Intolerance (Gi) Other (See Comments)    Mild abdominal pain, gas, bloating   Nsaids Other (See Comments)    GI upset/stomach pain   Risedronate Sodium Nausea And Vomiting and Other (See Comments)     GI upset/heartburn    Rofecoxib Nausea And Vomiting and Other (See Comments)    GI upset/heartburn    Alendronate Sodium Nausea And Vomiting   Aspirin Other (See Comments)    Abdominal pain    Cardizem [Diltiazem Hcl] Hives   Eliquis [Apixaban] Hives   Fish Allergy Nausea And Vomiting    Can take fish oil   Benadryl [Diphenhydramine Hcl] Palpitations   Other Itching and Rash    EKG LEADS caused burning sores     Chief Complaint  Patient presents with   Medication Management    Routine visit. Sign Contract      HPI: Patient is a 77 y.o. female for routine follow up.   Reports she is having a lot of palpitation over the last few days.  Cardiologist wants her to take norvasc first thing in the morning and blood pressure has been well controlled.   She had a fall  after emgality shot.  Last year her migraines were so hard to manage she was looking for a new neurologist for migraines- recommended to see Dr Delena Bali due to complexity of medical conditions.  She recommended she stay on foricet for migraines PRN but to try emgality monthly for preventative.  On Aug 1st around noon she gave herself the injections at home. After 2nd injection she felt the blood drain out of her face and the next thing she knew she was on the floor. She cut her knee and left leg injured- continues to be slightly painful.  She was worried she fractured ankle due to pain and swelling.  [facility] went to [facility] and they insisted she go to the ED due to glasses hitting her eyebrow Never looked at her knee or ankle but overall getting better  Emgality added to allergy list after episodes. Continuing to follow up with Dr Delena Bali to find a medication that works for her.   She saw her regular neurologist and wants to get off baclofen - she is also taking robaxin. But thinks baclofen could be helping migraines.  Unable to not take robaxin  She is planning on cutting baclofen in half for 2 weeks and seeing what happens with the headaches.     Review of Systems:  Review of Systems  Constitutional:  Negative for chills, fever and weight loss.  HENT:  Negative for tinnitus.   Respiratory:  Negative for cough, sputum production and shortness of breath.   Cardiovascular:  Positive for  palpitations. Negative for chest pain and leg swelling.  Gastrointestinal:  Negative for abdominal pain, constipation, diarrhea and heartburn.  Genitourinary:  Negative for dysuria, frequency and urgency.  Musculoskeletal:  Positive for back pain and myalgias. Negative for falls and joint pain.  Skin: Negative.   Neurological:  Positive for dizziness. Negative for headaches.  Psychiatric/Behavioral:  Negative for depression and memory loss. The patient does not have insomnia.     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   Chronic migraine without aura, intractable, without status migrainosus    Diverticulosis 2010   Endometriosis    Low sodium levels    Malnutrition (HCC)    MD (muscular dystrophy) (HCC)    Meniere's disease, bilateral    [facility]    [facility]   Peripheral neuropathy    Dr Sandria Manly   Pneumonia    Sensorineural hearing loss, bilateral    Vertigo 2019   Past Surgical History:  Procedure Laterality Date   ABDOMINAL HYSTERECTOMY  1980 or 1981   for Endometriosis   APPENDECTOMY  1965   high school; low grade appendiceal cancer   ATRIAL FIBRILLATION ABLATION N/A 06/17/2020   Procedure: ATRIAL FIBRILLATION ABLATION;  Surgeon: Hillis Range, MD;  Location: [facility];  Service: Cardiovascular;  Laterality: N/A;   BREAST ENHANCEMENT SURGERY Bilateral 1979   COLONOSCOPY  01/2012   Tics; Dr Dorena Cookey   ESI      X 3 in 2003 & 02/17/2011 for L4-S1 symptoms   implantable loop recorder placement  01/14/2021   Medtronic Reveal Linq model LNQ 22 ([place] [id number]) implantable loop recorder   LUMBAR LAMINECTOMY  12/2012   W-S , [place]   TONSILLECTOMY  1950 or 1951   TUBAL LIGATION     vocal cords stripped  1970   Social History:   reports that she has never smoked. She has never used smokeless tobacco. She reports that she does not currently use alcohol. She reports that she does not use drugs.  Family History  Problem Relation Age of Onset   Alcohol abuse Mother    Hypertension Mother    Diabetes Mother 57   Stroke Mother 71   Dementia Mother    Kidney failure Mother    Osteoporosis Mother    Alzheimer's disease Father 60   Cancer Maternal Aunt 34       pancreatic   Arthritis Sister 59       knee replacement   Hyperlipidemia Sister    Lung disease Maternal Grandmother    Lung disease Maternal Grandfather    Hyperlipidemia Sister 72   Other Son        recent back injury   Other Daughter        recent back injury;  knee issues when playing tennis   Heart disease Neg Hx    Heart attack Neg Hx     Medications: Patient's Medications  New Prescriptions   No medications on file  Previous Medications   ACETAMINOPHEN (TYLENOL) 500 MG TABLET    Take 500 mg by mouth at bedtime as needed for moderate pain.   AMLODIPINE (NORVASC) 5 MG TABLET    Take 1 tablet (5 mg total) by mouth daily.   BACLOFEN (LIORESAL) 10 MG TABLET    Take 10 mg by mouth at bedtime.   BUTALBITAL-ACETAMINOPHEN-CAFFEINE (FIORICET) 50-325-40 MG TABLET    TAKE 1 TABLET BY MOUTH EVERY  6 HOURS AS NEEDED FOR MIGRAINE   CALCIUM CITRATE-VITAMIN D (CALCIUM + D PO)    Take 1 tablet by mouth as directed. 3-4 times daily   CARBOXYMETHYLCELLULOSE SODIUM (THERATEARS) 0.25 % SOLN    Place 1 drop into both eyes 4 (four) times daily.   CHLORPHENIRAMINE (CHLOR-TRIMETON) 4 MG TABLET    Take 4-8 mg by mouth at bedtime.   CHOLECALCIFEROL (VITAMIN D3) LIQD    Take 1,000 Units by mouth daily.   FAMOTIDINE (PEPCID) 20 MG TABLET    Take 20 mg by mouth daily.   FEMRING 0.05 MG/24HR RING    Place 1 each vaginally every 3 (three) months.   HYDRALAZINE (APRESOLINE) 25 MG TABLET    Take 1 tablet (25 mg total) by mouth 3 (three) times daily as needed (for systolic blood pressure greater than 150).   LUTEIN 20 MG TABS    Take 20 mg by mouth daily.    MAGNESIUM CITRATE PO    Take 300 mg by mouth daily.   MECLIZINE (ANTIVERT) 12.5 MG TABLET    Take 1 tablet (12.5 mg total) by mouth 3 (three) times daily.   METHOCARBAMOL (ROBAXIN) 750 MG TABLET    Take 750 mg by mouth every 6 (six) hours.   METOPROLOL SUCCINATE (TOPROL XL) 25 MG 24 HR TABLET    Take 0.5 tablets (12.5 mg total) by mouth 2 (two) times daily.   METOPROLOL TARTRATE (LOPRESSOR) 25 MG TABLET    Take 0.5 tablets (12.5 mg total) by mouth as needed (for breakthrough palpitations).   MULTIPLE VITAMIN (MULTIVITAMIN WITH MINERALS) TABS TABLET    Take 1 tablet by mouth daily.   MULTIPLE VITAMINS-MINERALS (PRESERVISION  AREDS 2) CAPS    Take 1 capsule by mouth 2 (two) times daily.   ONDANSETRON (ZOFRAN-ODT) 4 MG DISINTEGRATING TABLET    DISSOLVE 1 TABLET(4 MG) ON THE TONGUE EVERY 6 HOURS AS NEEDED FOR NAUSEA OR VOMITING   SODIUM CHLORIDE 1 G TABLET    Take 1 g by mouth 4 (four) times daily.   XARELTO 20 MG TABS TABLET    TAKE 1 TABLET DAILY WITH SUPPER  Modified Medications   No medications on file  Discontinued Medications   ATOGEPANT (QULIPTA) 30 MG TABS    Take 1 tablet (30 mg total) by mouth daily.   EVENING PRIMROSE OIL 1000 MG CAPS    Take 1,000 mg by mouth every evening.   ZAVEGEPANT HCL (ZAVZPRET) 10 MG/ACT SOLN    Place 10 mg into the nose as needed (for migraine). Max dose 1 spray in 24 hours    Physical Exam:  Vitals:   11/15/22 1518  BP: 119/88  Pulse: 82  Resp: 18  Temp: 97.8 F (36.6 C)  SpO2: 97%  Weight: 117 lb 9.6 oz (53.3 kg)  Height: 5\' 6"  (1.676 m)   Body mass index is 18.98 kg/m. Wt Readings from Last 3 Encounters:  11/15/22 117 lb 9.6 oz (53.3 kg)  10/06/22 110 lb (49.9 kg)  10/04/22 110 lb 3.2 oz (50 kg)    Physical Exam Constitutional:      General: She is not in acute distress.    Appearance: She is well-developed. She is not diaphoretic.  HENT:     Head: Normocephalic and atraumatic.     Mouth/Throat:     Pharynx: No oropharyngeal exudate.  Eyes:     Conjunctiva/sclera: Conjunctivae normal.     Pupils: Pupils are equal, round, and reactive to light.  Cardiovascular:  Rate and Rhythm: Normal rate and regular rhythm.     Heart sounds: Normal heart sounds.  Pulmonary:     Effort: Pulmonary effort is normal.     Breath sounds: Normal breath sounds.  Abdominal:     General: Bowel sounds are normal.     Palpations: Abdomen is soft.  Musculoskeletal:     Cervical back: Normal range of motion and neck supple.     Right lower leg: No edema.     Left lower leg: No edema.  Skin:    General: Skin is warm and dry.  Neurological:     Mental Status: She is  alert.  Psychiatric:        Mood and Affect: Mood normal.     Labs reviewed: Basic Metabolic Panel: Recent Labs    03/31/22 1502 04/01/22 0006 04/19/22 1628 07/17/22 1514 08/30/22 1508  NA 130* 128*  --  138 134*  K 4.5 4.2  --  4.9 5.0  CL 93* 92*  --  101 97*  CO2 29 28  --  31 29  GLUCOSE 102 113*  --  100* 101*  BUN 20 21  --  27* 25  CREATININE 0.55* 0.63  --  0.59* 0.61  CALCIUM 9.8 9.2  --  9.7 9.6  MG 2.4 2.7*  --   --   --   TSH  --   --  2.830  --   --    Liver Function Tests: No results for input(s): "AST", "ALT", "ALKPHOS", "BILITOT", "PROT", "ALBUMIN" in the last 8760 hours. No results for input(s): "LIPASE", "AMYLASE" in the last 8760 hours. No results for input(s): "AMMONIA" in the last 8760 hours. CBC: Recent Labs    04/01/22 0006  WBC 6.3  NEUTROABS 5.3  HGB 12.7  HCT 38.5  MCV 92.3  PLT 242   Lipid Panel: No results for input(s): "CHOL", "HDL", "LDLCALC", "TRIG", "CHOLHDL", "LDLDIRECT" in the last 8760 hours. TSH: Recent Labs    04/19/22 1628  TSH 2.830   A1C: Lab Results  Component Value Date   HGBA1C 5.6 03/03/2011     Assessment/Plan 1. Chronic hyponatremia Continues on sodium tablets - Complete Metabolic Panel with eGFR; Future  2. Primary hypertension -well controlled on current regimen  - Complete Metabolic Panel with eGFR; Future  3. Muscular dystrophy (HCC) -stable, continues to follow up with neurology, continues on   4. Gastroesophageal reflux disease without esophagitis -stable on Pepcid.   5. Paroxysmal atrial fibrillation (HCC) Rate controlled, occasionally PVC which she has PRN metoprolol that she can use.  Continues on toprol XL daily Continues on xarelto 20 mg daily for anticoagulation  - Complete Metabolic Panel with eGFR; Future - CBC with Differential/Platelet; Future  6. Intractable migraine with aura with status migrainosus Continues to follow up with neurology, will start seeing Dr Lucia Gaskins next month  for follow up. She is planning to titrate baclofen and will monitor for increase migraines.   7. Vitamin D deficiency - Vitamin D, 25-hydroxy; Future  8. Need for influenza vaccination - Flu Vaccine Trivalent High Dose (Fluad)  Will follow up labs later this week.  Return in about 4 months (around 03/17/2023) for routine followup .:  Teofilo Lupinacci K. Biagio Borg [facility] [phone]

## 2022-11-16 ENCOUNTER — Other Ambulatory Visit: Payer: Medicare Other

## 2022-11-16 DIAGNOSIS — E559 Vitamin D deficiency, unspecified: Secondary | ICD-10-CM | POA: Diagnosis not present

## 2022-11-16 DIAGNOSIS — I10 Essential (primary) hypertension: Secondary | ICD-10-CM

## 2022-11-16 DIAGNOSIS — I480 Paroxysmal atrial fibrillation: Secondary | ICD-10-CM | POA: Diagnosis not present

## 2022-11-16 DIAGNOSIS — E871 Hypo-osmolality and hyponatremia: Secondary | ICD-10-CM

## 2022-11-17 LAB — COMPLETE METABOLIC PANEL WITH GFR
AG Ratio: 1.6 (calc) (ref 1.0–2.5)
ALT: 13 U/L (ref 6–29)
AST: 18 U/L (ref 10–35)
Albumin: 4.2 g/dL (ref 3.6–5.1)
Alkaline phosphatase (APISO): 74 U/L (ref 37–153)
BUN/Creatinine Ratio: 36 (calc) — ABNORMAL HIGH (ref 6–22)
BUN: 20 mg/dL (ref 7–25)
CO2: 32 mmol/L (ref 20–32)
Calcium: 9.6 mg/dL (ref 8.6–10.4)
Chloride: 93 mmol/L — ABNORMAL LOW (ref 98–110)
Creat: 0.55 mg/dL — ABNORMAL LOW (ref 0.60–1.00)
Globulin: 2.6 g/dL (ref 1.9–3.7)
Glucose, Bld: 133 mg/dL — ABNORMAL HIGH (ref 65–99)
Potassium: 4.8 mmol/L (ref 3.5–5.3)
Sodium: 130 mmol/L — ABNORMAL LOW (ref 135–146)
Total Bilirubin: 0.4 mg/dL (ref 0.2–1.2)
Total Protein: 6.8 g/dL (ref 6.1–8.1)
eGFR: 94 mL/min/{1.73_m2} (ref >=60)

## 2022-11-17 LAB — CBC WITH DIFFERENTIAL/PLATELET
Absolute Monocytes: 441 {cells}/uL (ref 200–950)
Basophils Absolute: 12 {cells}/uL (ref 0–200)
Basophils Relative: 0.2 %
Eosinophils Absolute: 99 {cells}/uL (ref 15–500)
Eosinophils Relative: 1.7 %
HCT: 39.7 % (ref 35.0–45.0)
Hemoglobin: 13 g/dL (ref 11.7–15.5)
Lymphs Abs: 684 {cells}/uL — ABNORMAL LOW (ref 850–3900)
MCH: 30.6 pg (ref 27.0–33.0)
MCHC: 32.7 g/dL (ref 32.0–36.0)
MCV: 93.4 fL (ref 80.0–100.0)
MPV: 10.7 fL (ref 7.5–12.5)
Monocytes Relative: 7.6 %
Neutro Abs: 4565 {cells}/uL (ref 1500–7800)
Neutrophils Relative %: 78.7 %
Platelets: 275 10*3/uL (ref 140–400)
RBC: 4.25 10*6/uL (ref 3.80–5.10)
RDW: 12 % (ref 11.0–15.0)
Total Lymphocyte: 11.8 %
WBC: 5.8 10*3/uL (ref 3.8–10.8)

## 2022-11-17 LAB — VITAMIN D 25 HYDROXY (VIT D DEFICIENCY, FRACTURES): Vit D, 25-Hydroxy: 44 ng/mL (ref 30–100)

## 2022-11-28 DIAGNOSIS — Z85828 Personal history of other malignant neoplasm of skin: Secondary | ICD-10-CM | POA: Diagnosis not present

## 2022-11-28 DIAGNOSIS — C44319 Basal cell carcinoma of skin of other parts of face: Secondary | ICD-10-CM | POA: Diagnosis not present

## 2022-11-29 LAB — CUP PACEART REMOTE DEVICE CHECK
Date Time Interrogation Session: 20240923230147
Implantable Pulse Generator Implant Date: 20221111

## 2022-11-30 ENCOUNTER — Encounter: Payer: Self-pay | Admitting: Psychiatry

## 2022-12-04 ENCOUNTER — Ambulatory Visit (INDEPENDENT_AMBULATORY_CARE_PROVIDER_SITE_OTHER): Payer: Medicare Other

## 2022-12-04 DIAGNOSIS — I480 Paroxysmal atrial fibrillation: Secondary | ICD-10-CM

## 2022-12-17 DIAGNOSIS — I10 Essential (primary) hypertension: Secondary | ICD-10-CM

## 2022-12-18 ENCOUNTER — Telehealth: Payer: Self-pay | Admitting: Cardiology

## 2022-12-18 NOTE — Telephone Encounter (Signed)
*  STAT* If patient is at the pharmacy, call can be transferred to refill team.   1. Which medications need to be refilled? (please list name of each medication and dose if known) amLODipine (NORVASC) 5 MG tablet   2. Which pharmacy/location (including street and city if local pharmacy) is medication to be sent to? [facility] [place], [place] - [address]   3. Do they need a 30 day or 90 day supply? 90

## 2022-12-18 NOTE — Progress Notes (Signed)
Carelink Summary Report / Loop Recorder 

## 2022-12-19 ENCOUNTER — Other Ambulatory Visit: Payer: Self-pay

## 2022-12-19 MED ORDER — AMLODIPINE BESYLATE 5 MG PO TABS: 5.0000 mg | ORAL_TABLET | Freq: Every day | ORAL | 1 refills | Status: DC

## 2022-12-19 NOTE — Telephone Encounter (Signed)
Patient is following up. She states she is going out of town tomorrow and she is hopeful to have the medication sent in ASAP. She insisted that I bypass refill dept and forward this request directly to clinical/supporting staff of [facility] [place]. She mentions that she has been taking 1/2 tablets.

## 2022-12-20 MED ORDER — AMLODIPINE BESYLATE 2.5 MG PO TABS
2.5000 mg | ORAL_TABLET | Freq: Every day | ORAL | 3 refills | Status: AC
Start: 2022-12-20 — End: 2023-12-19

## 2022-12-20 NOTE — Telephone Encounter (Signed)
Per chart amlodipine 2.5 mg was sent to patient's pharmacy today.

## 2023-01-02 LAB — CUP PACEART REMOTE DEVICE CHECK
Date Time Interrogation Session: 20241026230501
Implantable Pulse Generator Implant Date: 20221111

## 2023-01-08 ENCOUNTER — Ambulatory Visit (INDEPENDENT_AMBULATORY_CARE_PROVIDER_SITE_OTHER): Payer: Medicare Other

## 2023-01-08 DIAGNOSIS — I4891 Unspecified atrial fibrillation: Secondary | ICD-10-CM | POA: Diagnosis not present

## 2023-01-10 DIAGNOSIS — L0889 Other specified local infections of the skin and subcutaneous tissue: Secondary | ICD-10-CM | POA: Diagnosis not present

## 2023-01-25 DIAGNOSIS — Z8739 Personal history of other diseases of the musculoskeletal system and connective tissue: Secondary | ICD-10-CM | POA: Diagnosis not present

## 2023-01-25 DIAGNOSIS — Z01419 Encounter for gynecological examination (general) (routine) without abnormal findings: Secondary | ICD-10-CM | POA: Diagnosis not present

## 2023-01-25 DIAGNOSIS — Z681 Body mass index (BMI) 19 or less, adult: Secondary | ICD-10-CM | POA: Diagnosis not present

## 2023-01-25 DIAGNOSIS — N951 Menopausal and female climacteric states: Secondary | ICD-10-CM | POA: Diagnosis not present

## 2023-01-29 NOTE — Progress Notes (Signed)
Carelink Summary Report / Loop Recorder 

## 2023-02-03 LAB — CUP PACEART REMOTE DEVICE CHECK
Date Time Interrogation Session: 20241128230523
Implantable Pulse Generator Implant Date: 20221111

## 2023-02-05 ENCOUNTER — Ambulatory Visit (INDEPENDENT_AMBULATORY_CARE_PROVIDER_SITE_OTHER): Payer: Medicare Other

## 2023-02-05 ENCOUNTER — Telehealth: Payer: Self-pay

## 2023-02-05 DIAGNOSIS — I4891 Unspecified atrial fibrillation: Secondary | ICD-10-CM | POA: Diagnosis not present

## 2023-02-05 NOTE — Telephone Encounter (Signed)
Pt wants [place] to make sure we include her pvc count in her my chart messages.

## 2023-02-06 ENCOUNTER — Ambulatory Visit (INDEPENDENT_AMBULATORY_CARE_PROVIDER_SITE_OTHER): Payer: Medicare Other | Admitting: Neurology

## 2023-02-06 ENCOUNTER — Encounter: Payer: Self-pay | Admitting: Neurology

## 2023-02-06 VITALS — BP 122/76 | HR 78 | Ht 66.0 in | Wt 116.0 lb

## 2023-02-06 DIAGNOSIS — G43109 Migraine with aura, not intractable, without status migrainosus: Secondary | ICD-10-CM | POA: Insufficient documentation

## 2023-02-06 DIAGNOSIS — G43709 Chronic migraine without aura, not intractable, without status migrainosus: Secondary | ICD-10-CM | POA: Insufficient documentation

## 2023-02-06 NOTE — Progress Notes (Signed)
Referring:  Alicia Seller, NP [address] [place]. [place],  [place] [address]  PCP: Alicia Seller, NP  Neurology was asked to evaluate Alicia [name], a 77 year old female for a chief complaint of headaches.  Our recommendations of care will be communicated by shared medical record.    CC:  headaches  02/06/2023: Here for follow up. She initially saw my colleague Dr. Delena [name] who is no longer at [facility] and she Is seeing me for the first time today.  I reviewed Dr. Sherri [name] notes from July this year, she was evaluated for headache. has GOITER, NONTOXIC MULTINODULAR; HYPERLIPIDEMIA NEC/NOS; PERIPHERAL NEUROPATHY; FIBROMYALGIA; Disorder of bone and cartilage; PARESTHESIA; SKIN CANCER, HX OF; Muscular dystrophy (HCC); Lumbar radiculopathy; Idiopathic scoliosis; Heart palpitations; Chronic hyponatremia; Diverticulosis of colon without hemorrhage; Chest pain; Carotid stenosis; Acute bronchitis; Atrial fibrillation with RVR (HCC); Atypical chest pain; Paroxysmal atrial fibrillation (HCC); Secondary hypercoagulable state (HCC); Atypical atrial flutter (HCC); Typical atrial flutter (HCC); Upper airway cough syndrome with refractory rhinitis; Chronic migraine without aura without status migrainosus, not intractable; and Migraine with aura and without status migrainosus, not intractable on their problem list.  Per Dr. Sherri [name] notes, and patient verifies today: she has paroxysmal A-fib status post ablation, peripheral neuropathy, limb-girdle muscular dystrophy, chronic hyponatremia but she presented for evaluation of migraines which began 30 years prior, left frontal pain with associated photophobia phonophobia nausea vomiting and vertigo, headache frequency variable can have several in 1 week or be headache free for a month, when Dr. Lauris [name] I saw her she had recently had 3 migraines in the prior week.  She was on Maxalt she cannot take that anymore due to palpitations, triptans are contraindicated, was on Fioricet  and Ubrelvy at that appointment, Alicia [name] working about 25% of the time and has to take a second dose, she tried Nurtec previously but found it ineffective with side effects.  She wanted to be on a preventative but struggled to find an effective one that does not interfere with her multiple medical issues.  Dr. Lauris [name] a started her on Emgality for prevention and Zavzpret for migraine rescue. She had a fall after injection and was changed to qulipta. Zavzpret was approved. CGRP injections now listed as allergy and cannot take due to side effects. She only tried the Alicia [name] for 7 days it was terribly expensive. She had dizziness and nausea with qulipta. Ubrelvy doesn;t work. She gets dizziness with the headaches. She was tested extensively by Dr. Jac [name] in ENT. Has prominent vertigo/dizziness with and without the migraines. Motion makes it worse. For the last 5 months: She has 8-10 migraine days a month lasting up to 24 hours, no aura with the migraines, no medication overuse, and > 15 total headache days a month.   Discussed botox. Is leaving for the holidays she would love to get it by 20th we will see what we can do and dr Alicia [name] can inject will do a lower dose and no neck due to the muscular dystrophy; Start Botox for chronic migraines  with Dr. Lucia [name]. 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 Initially could try a lower dose.  Also in occipitalis higher not near any neck muscles or dont do the cervical paraspinal muscles at all and dont do trapezius.    Per Dr. Quentin [name] note: Prior Therapies  Prevention: Atenolol Metoprolol 25 mg BID Amlodipine Depakote 500 mg QHS Gabapentin 200 mg at bedtime Cymbalta - developed hyponatremia after 1st dose Would avoid TCAs for now due to uncontrolled palpitations Would avoid Topamax as patient is underweight Emgality (syncope, cgrp injections contraindicated) Qulipta (side effects)    Rescue: Baclofen Robaxin Zofran Ubrelvy 100 mg PRN - not effective Nurtec 75 mg PRN - not effective Triptans contraindicated due to palpitations Maxalt 10 mg PRN Imitrex 50 mg PRN Fioricet  Reviewed imaging and labwork:     Latest Ref Rng & Units 11/16/2022    1:32 PM 04/01/2022   12:06 AM 10/06/2021   11:49 AM  CBC  WBC 3.8 - 10.8 Thousand/uL 5.8  6.3  7.3   Hemoglobin 11.7 - 15.5 g/dL 16.1  09.6  04.5   Hematocrit 35.0 - 45.0 % 39.7  38.5  42.3   Platelets 140 - 400 Thousand/uL 275  242  319       Latest Ref Rng & Units 11/16/2022    1:32 PM 08/30/2022    3:08 PM 07/17/2022    3:14 PM  CMP  Glucose 65 - 99 mg/dL 409  811  914   BUN 7 - 25 mg/dL 20  25  27    Creatinine 0.60 - 1.00 mg/dL 7.82  9.56  2.13   Sodium 135 - 146 mmol/L 130  134  138   Potassium 3.5 - 5.3 mmol/L 4.8  5.0  4.9   Chloride 98 - 110 mmol/L 93  97  101   CO2 20 - 32 mmol/L 32  29  31   Calcium 8.6 - 10.4 mg/dL 9.6  9.6  9.7   Total Protein 6.1 - 8.1 g/dL 6.8     Total Bilirubin 0.2 - 1.2 mg/dL 0.4     AST 10 - 35 U/L 18     ALT 6 - 29 U/L 13      TSH 04/2022 nml  10/2022: EXAM: CT HEAD WITHOUT CONTRAST   CT CERVICAL SPINE WITHOUT CONTRAST   TECHNIQUE: Multidetector CT imaging of the head and cervical spine was performed following the standard protocol without intravenous contrast. Multiplanar CT image reconstructions of the cervical spine were also generated.   RADIATION DOSE REDUCTION: This exam was performed according to the departmental dose-optimization program which includes automated exposure control, adjustment of the mA and/or kV according to patient size and/or use of iterative reconstruction technique.   COMPARISON:  CT high-resolution chest 01/16/2022   FINDINGS: CT HEAD FINDINGS   Brain:   Trace patchy and confluent areas of decreased attenuation are noted throughout the deep and periventricular white matter of the cerebral hemispheres bilaterally, compatible with  chronic microvascular ischemic disease. No evidence of large-territorial acute infarction. No parenchymal hemorrhage. No mass lesion. No extra-axial collection.   No mass effect or midline shift. No hydrocephalus. Basilar cisterns are patent.   Vascular: No hyperdense vessel.   Skull: No acute fracture or focal lesion.   Sinuses/Orbits: Paranasal sinuses and mastoid air cells are clear. The orbits are unremarkable.   Other: None.   CT CERVICAL SPINE FINDINGS   Alignment: Normal.   Skull base and vertebrae: Mild degenerative changes of the spine. No acute fracture. No aggressive appearing focal osseous lesion or focal pathologic process.   Soft tissues and spinal canal: No prevertebral fluid or swelling. No visible canal hematoma.   Upper chest: Similar-appearing marked biapical pleural/pulmonary scarring with associated calcifications.   Other: None.   IMPRESSION: 1. No  acute intracranial abnormality. 2. No acute displaced fracture or traumatic listhesis of the cervical spine.  05/17/2018: MRI brain  CLINICAL DATA:  Bilateral sensorineural hearing loss. Meniere's disease. Chronic migraine.   EXAM: MRI HEAD WITHOUT AND WITH CONTRAST   TECHNIQUE: Multiplanar, multiecho pulse sequences of the brain and surrounding structures were obtained without and with intravenous contrast.   CONTRAST:  9mL MULTIHANCE GADOBENATE DIMEGLUMINE 529 MG/ML IV SOLN   COMPARISON:  09/08/2017   FINDINGS: Brain: There is no evidence of acute infarct, intracranial hemorrhage, mass, midline shift, or extra-axial fluid collection. The ventricles and sulci are within normal limits for age. Patchy T2 hyperintensity in the pons and scattered small foci of T2 hyperintensity in the cerebral white matter bilaterally are unchanged and nonspecific but compatible with mild chronic small vessel ischemic disease.   Dedicated imaging through the internal auditory canals demonstrates a normal  course of cranial nerves VII and VIII without evidence of mass or abnormal enhancement. Inner ear structures demonstrate normal signal bilaterally. No mass is seen within the cerebellopontine angles.   Vascular: Major intracranial vascular flow voids are preserved.   Skull and upper cervical spine: Unremarkable bone marrow signal.   Sinuses/Orbits: Unremarkable orbits. Paranasal sinuses and mastoid air cells are clear.   Other: None.   IMPRESSION: 1. Negative internal auditory canal imaging. No retrocochlear lesion. 2. Unchanged mild chronic small vessel ischemic disease, most notable in the pons. 3. No acute intracranial abnormality.  Patient complains of symptoms per HPI as well as the following symptoms: none . Pertinent negatives and positives per HPI. All others negative  HPI 10/04/2022:  Dr. Delena [name] Medical co-morbidities: pAfib s/p ablation, peripheral neuropathy, limb-girdle muscular dystrophy, chronic hyponatremia  The patient presents for evaluation of migraines which began ~30 years ago. Migraines are described as left frontal pain with associated photophobia, phonophobia, nausea, vomiting, and vertigo. Headache frequency is highly variable, can have several  in one week or be headache free for 1 month. Most recently she had 3 migraines in the last week.  She previously took Maxalt for migraine rescue which worked well for her, but she had to stop it in 2022 due to palpitations. She is now taking Fioricet and Ubrelvy as needed. Alicia [name] works about 25% of the time and she often has to take a second dose. She has tried Nurtec previously but found this ineffective.  She would like to start a preventive, but has struggled to find an effective preventive that does not interfere with her multiple medical issues.   Headache History: Onset: 1990s Triggers: weather changes Aura: none Location: left frontal Associated Symptoms:  Photophobia: yes  Phonophobia: yes  Nausea:  yes Vomiting: yes Worse with activity?: yes Duration of headaches: several hours  Migraine days per month: 3 Headache free days per month: 27  Current Treatment: Abortive Ubrelvy 100 mg PRN Fioricet  Preventative none  Prior Therapies                                 Prevention: Atenolol Metoprolol 25 mg BID Amlodipine Depakote 500 mg QHS Gabapentin 200 mg at bedtime Cymbalta - developed hyponatremia after 1st dose Would avoid TCAs for now due to uncontrolled palpitations Would avoid Topamax as patient is underweight  Rescue: Baclofen Robaxin Zofran Ubrelvy 100 mg PRN Nurtec 75 mg PRN Maxalt 10 mg PRN Imitrex 50 mg PRN Fioricet  LABS: CBC    Component Value Date/Time   WBC  5.8 11/16/2022 1332   RBC 4.25 11/16/2022 1332   HGB 13.0 11/16/2022 1332   HGB 14.3 10/06/2021 1149   HCT 39.7 11/16/2022 1332   HCT 42.3 10/06/2021 1149   PLT 275 11/16/2022 1332   PLT 319 10/06/2021 1149   MCV 93.4 11/16/2022 1332   MCV 91 10/06/2021 1149   MCH 30.6 11/16/2022 1332   MCHC 32.7 11/16/2022 1332   RDW 12.0 11/16/2022 1332   RDW 12.2 10/06/2021 1149   LYMPHSABS 684 (L) 11/16/2022 1332   LYMPHSABS 0.7 10/06/2021 1149   MONOABS 0.5 04/01/2022 0006   EOSABS 99 11/16/2022 1332   EOSABS 0.1 10/06/2021 1149   BASOSABS 12 11/16/2022 1332   BASOSABS 0.0 10/06/2021 1149      Latest Ref Rng & Units 11/16/2022    1:32 PM 08/30/2022    3:08 PM 07/17/2022    3:14 PM  CMP  Glucose 65 - 99 mg/dL 956  213  086   BUN 7 - 25 mg/dL 20  25  27    Creatinine 0.60 - 1.00 mg/dL 5.78  4.69  6.29   Sodium 135 - 146 mmol/L 130  134  138   Potassium 3.5 - 5.3 mmol/L 4.8  5.0  4.9   Chloride 98 - 110 mmol/L 93  97  101   CO2 20 - 32 mmol/L 32  29  31   Calcium 8.6 - 10.4 mg/dL 9.6  9.6  9.7   Total Protein 6.1 - 8.1 g/dL 6.8     Total Bilirubin 0.2 - 1.2 mg/dL 0.4     AST 10 - 35 U/L 18     ALT 6 - 29 U/L 13        IMAGING:  MRI IAC: 05/05/19: IMPRESSION: 1. Auditory system is  unremarkable. Visualized cranial nerves within normal limits. 2. Mild leukoaraiosis with moderate pontine gliosis, most likely due to chronic microvascular disease. 3. No acute intracranial abnormality    Current Outpatient Medications on File Prior to Visit  Medication Sig Dispense Refill   acetaminophen (TYLENOL) 500 MG tablet Take 500 mg by mouth at bedtime as needed for moderate pain.     amLODipine (NORVASC) 2.5 MG tablet Take 1 tablet (2.5 mg total) by mouth daily. 90 tablet 3   baclofen (LIORESAL) 10 MG tablet Take 10 mg by mouth at bedtime.     butalbital-acetaminophen-caffeine (FIORICET) 50-325-40 MG tablet TAKE 1 TABLET BY MOUTH EVERY 6 HOURS AS NEEDED FOR MIGRAINE 30 tablet 0   Calcium Citrate-Vitamin D (CALCIUM + D PO) Take 1 tablet by mouth as directed. 3-4 times daily     Carboxymethylcellulose Sodium (THERATEARS) 0.25 % SOLN Place 1 drop into both eyes 4 (four) times daily.     chlorpheniramine (CHLOR-TRIMETON) 4 MG tablet Take 4-8 mg by mouth at bedtime.     Cholecalciferol (VITAMIN D3) LIQD Take 1,000 Units by mouth daily.     FEMRING 0.05 MG/24HR RING Place 1 each vaginally every 3 (three) months.     Lutein 20 MG TABS Take 20 mg by mouth daily.      MAGNESIUM CITRATE PO Take 300 mg by mouth daily.     methocarbamol (ROBAXIN) 750 MG tablet Take 750 mg by mouth every 6 (six) hours.     metoprolol succinate (TOPROL XL) 25 MG 24 hr tablet Take 0.5 tablets (12.5 mg total) by mouth 2 (two) times daily. 90 tablet 3   Multiple Vitamin (MULTIVITAMIN WITH MINERALS) TABS tablet Take 1 tablet by mouth  daily.     Multiple Vitamins-Minerals (PRESERVISION AREDS 2) CAPS Take 1 capsule by mouth 2 (two) times daily.     ondansetron (ZOFRAN-ODT) 4 MG disintegrating tablet DISSOLVE 1 TABLET(4 MG) ON THE TONGUE EVERY 6 HOURS AS NEEDED FOR NAUSEA OR VOMITING 60 tablet 1   sodium chloride 1 g tablet Take 1 g by mouth 4 (four) times daily.     XARELTO 20 MG TABS tablet TAKE 1 TABLET DAILY WITH  SUPPER 90 tablet 3   No current facility-administered medications on file prior to visit.     Allergies: Allergies  Allergen Reactions   Biaxin [Clarithromycin] Anaphylaxis   Emgality [Galcanezumab-Gnlm] Other (See Comments)    Dizziness/ n/v, passed out   Sulfonamide Derivatives Itching and Rash   Tequin [Gatifloxacin] Anaphylaxis   Demerol Nausea And Vomiting   Lactose Intolerance (Gi) Other (See Comments)    Mild abdominal pain, gas, bloating   Nsaids Other (See Comments)    GI upset/stomach pain   Risedronate Sodium Nausea And Vomiting and Other (See Comments)     GI upset/heartburn    Rofecoxib Nausea And Vomiting and Other (See Comments)    GI upset/heartburn    Alendronate Sodium Nausea And Vomiting   Aspirin Other (See Comments)    Abdominal pain    Cardizem [Diltiazem Hcl] Hives   Eliquis [Apixaban] Hives   Fish Allergy Nausea And Vomiting    Can take fish oil   Benadryl [Diphenhydramine Hcl] Palpitations   Other Itching and Rash    EKG LEADS caused burning sores     Family History: Family History  Problem Relation Age of Onset   Alcohol abuse Mother    Hypertension Mother    Diabetes Mother 48   Stroke Mother 84   Dementia Mother    Kidney failure Mother    Osteoporosis Mother    Alzheimer's disease Father 64   Cancer Maternal Aunt 40       pancreatic   Arthritis Sister 90       knee replacement   Hyperlipidemia Sister    Lung disease Maternal Grandmother    Lung disease Maternal Grandfather    Hyperlipidemia Sister 24   Other Son        recent back injury   Other Daughter        recent back injury; knee issues when playing tennis   Heart disease Neg Hx    Heart attack Neg Hx      Past Medical History: 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   Chronic migraine without aura, intractable, without status migrainosus    Diverticulosis 2010   Endometriosis    Low sodium levels    Malnutrition  (HCC)    MD (muscular dystrophy) (HCC)    Meniere's disease, bilateral    [facility]    [facility]   Peripheral neuropathy    Dr Sandria Manly   Pneumonia    Sensorineural hearing loss, bilateral    Vertigo 2019    Past Surgical History Past Surgical History:  Procedure Laterality Date   ABDOMINAL HYSTERECTOMY  1980 or 1981   for Endometriosis   APPENDECTOMY  1965   high school; low grade appendiceal cancer   ATRIAL FIBRILLATION ABLATION N/A 06/17/2020   Procedure: ATRIAL FIBRILLATION ABLATION;  Surgeon: Hillis Range, MD;  Location: [facility];  Service: Cardiovascular;  Laterality: N/A;   BREAST ENHANCEMENT SURGERY Bilateral 1979  COLONOSCOPY  01/2012   Tics; Dr Dorena Cookey   ESI      X 3 in 2003 & 02/17/2011 for L4-S1 symptoms   implantable loop recorder placement  01/14/2021   Medtronic Reveal Linq model LNQ 22 ([place] [id number]) implantable loop recorder   LUMBAR LAMINECTOMY  12/2012   W-S , Alicia   TONSILLECTOMY  1950 or 1951   TUBAL LIGATION     vocal cords stripped  1970    Social History: Social History   Tobacco Use   Smoking status: Never   Smokeless tobacco: Never  Vaping Use   Vaping status: Never Used  Substance Use Topics   Alcohol use: Not Currently   Drug use: Never    ROS: Negative for fevers, chills. Positive for headaches. All other systems reviewed and negative unless stated otherwise in HPI.   Physical Exam:   Vital Signs: BP 122/76   Pulse 78   Ht 5\' 6"  (1.676 m)   Wt 116 lb (52.6 kg)   BMI 18.72 kg/m  GENERAL: well appearing,in no acute distress,alert SKIN:  Color, texture, turgor normal. No rashes or lesions HEAD:  Normocephalic/atraumatic. CV:  RRR RESP: Normal respiratory effort MSK: no tenderness to palpation over occiput, neck, or shoulders  NEUROLOGICAL: Mental Status: Alert, oriented to person, place and time,Follows commands Cranial Nerves: PERRL, visual fields intact to confrontation, extraocular movements  intact, facial sensation intact, no facial droop or ptosis, hearing grossly intact, no dysarthria Motor: muscle strength 4+/5 bilateral arm abduction, 4+/5 bilateral hip flexion, otherwise 5/5 both upper and lower extremities Reflexes: 2+ throughout Sensation: intact to light touch all 4 extremities Coordination: Finger-to- nose-finger intact bilaterally Gait: normal-based   IMPRESSION: 77 year old female with a history of pAfib s/p ablation, peripheral neuropathy, limb-girdle muscular dystrophy, chronic hyponatremia who presents for evaluation of migraines. She has been unable to tolerate triptans due to palpitations and Ubrelvy/Nurtec have been relatively ineffective.Side effects with emgality(listed as an allergy) and side effects with qulipta, she does not want another cgrp medication. She has tried and failed multiple medications:  PLAN: She gets dizziness with the headaches. She was tested extensively by Dr. Jac [name] in ENT. Has prominent vertigo/dizziness with and without the migraines. Motion makes it worse. For the last 5 months: She has >10 migraine days a month lasting up to 24 hours, no aura with the migraines, no medication overuse, and > 15 total headache days a month.   Discussed botox. Is leaving for the holidays she would love to get it by 20th we will see what we can do and dr Alicia [name] can inject will do a lower dose and no neck due to the muscular dystrophy; Start Botox for chronic migraines  with Dr. Lucia [name]. 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 Initially could try a lower dose.  Also in occipitalis higher not near any neck muscles or dont do the cervical paraspinal muscles at all and dont do trapezius. Discussed that we have to be cautious due to the muscular dystrophy, discussed the risks of weakness with botox, patient understands and wants to go forward.     Prior Therapies                                  Prevention: Atenolol Metoprolol 25 mg BID Amlodipine Depakote 500 mg QHS Gabapentin 200 mg at bedtime Cymbalta - developed hyponatremia after 1st  dose Would avoid TCAs for now due to uncontrolled palpitations Would avoid Topamax as patient is underweight Emgality (syncope, cgrp injections contraindicated) Qulipta (side effects)   Rescue: Baclofen Robaxin Zofran Ubrelvy 100 mg PRN - not effective Nurtec 75 mg PRN - not effective Triptans contraindicated due to palpitations Maxalt 10 mg PRN Imitrex 50 mg PRN Fioricet  I spent over 60 minutes of face-to-face and non-face-to-face time with patient on the  1. Chronic migraine without aura without status migrainosus, not intractable    diagnosis.  This included previsit chart review, lab review, study review, order entry, electronic health record documentation, patient education on the different diagnostic and therapeutic options, counseling and coordination of care, risks and benefits of management, compliance, or risk factor reduction  Dr. Naomie Dean

## 2023-02-06 NOTE — Patient Instructions (Addendum)
Start Botox for chronic migraines with Dr. Lucia Gaskins. 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 Initially could try a lower dose.  Also in occipitalis higher not near any neck muscles or dont do the cervical paraspinal muscles at all and dont do trapezius.     OnabotulinumtoxinA Injection (Medical Use) What is this medication? ONABOTULINUMTOXINA (o na BOTT you lye num tox in eh) treats severe muscle spasms. It may also be used to prevent migraine headaches. It can treat excessive sweating when other medications do not work well enough. This medicine may be used for other purposes; ask your health care provider or pharmacist if you have questions. COMMON BRAND NAME(S): Botox What should I tell my care team before I take this medication? They need to know if you have any of these conditions: Breathing problems Cerebral palsy spasms Difficulty urinating Heart problems History of surgery where this medication is going to be used Infection at the site where this medication is going to be used Myasthenia gravis or other neurologic disease Nerve or muscle disease Surgery plans Take medications that treat or prevent blood clots Thyroid problems An unusual or allergic reaction to botulinum toxin, albumin, other medications, foods, dyes, or preservatives Pregnant or trying to get pregnant Breast-feeding How should I use this medication? This medication is for injected into a muscle. It is given by your care team in a hospital or clinic setting. A special MedGuide will be given to you before each treatment. Be sure to read this information carefully each time. Talk to your care team about the use of this medication in children. While this medication may be prescribed for children as young as 2 years for selected conditions, precautions do apply. Overdosage: If you think you have taken too much of this medicine contact a poison control center or emergency room at  once. NOTE: This medicine is only for you. Do not share this medicine with others. What if I miss a dose? This does not apply. What may interact with this medication? Aminoglycoside antibiotics, such as gentamicin, neomycin, tobramycin Muscle relaxants Other botulinum toxin injections This list may not describe all possible interactions. Give your health care provider a list of all the medicines, herbs, non-prescription drugs, or dietary supplements you use. Also tell them if you smoke, drink alcohol, or use illegal drugs. Some items may interact with your medicine. What should I watch for while using this medication? Visit your care team for regular check ups. This medication will cause weakness in the muscle where it is injected. Tell your care team if you feel unusually weak in other muscles. Get medical help right away if you have problems with breathing, swallowing, or talking. This medication might make your eyelids droop or make you see blurry or double. If you have weak muscles or trouble seeing do not drive a car, use machinery, or do other dangerous activities. This medication contains albumin from human blood. It may be possible to pass an infection in this medication, but no cases have been reported. Talk to your care team about the risks and benefits of this medication. If your activities have been limited by your condition, go back to your regular routine slowly after treatment with this medication. What side effects may I notice from receiving this medication? Side effects that you should report to your care team as soon as possible: Allergic reactions--skin rash, itching, hives, swelling of the face, lips, tongue, or throat Dryness or irritation  of the eyes, eye pain, change in vision, sensitivity to light Infection--fever, chills, cough, sore throat, wounds that don't heal, pain or trouble when passing urine, general feeling of discomfort or being unwell Spread of botulinum toxin  effects--unusual weakness or fatigue, blurry or double vision, trouble swallowing, hoarseness or trouble speaking, trouble breathing, loss of bladder control Trouble passing urine Side effects that usually do not require medical attention (report these to your care team if they continue or are bothersome): Dry mouth Eyelid drooping Fatigue Headache Pain, redness, or irritation at injection site This list may not describe all possible side effects. Call your doctor for medical advice about side effects. You may report side effects to FDA at 1-800-FDA-1088. Where should I keep my medication? This medication is given in a hospital or clinic and will not be stored at home. NOTE: This sheet is a summary. It may not cover all possible information. If you have questions about this medicine, talk to your doctor, pharmacist, or health care provider.  2024 Elsevier/Gold Standard (2021-02-17 00:00:00)

## 2023-02-07 ENCOUNTER — Telehealth: Payer: Self-pay | Admitting: *Deleted

## 2023-02-07 NOTE — Telephone Encounter (Signed)
Pt called in saying she was unable to come on 12/19. I was able to get her rescheduled for 12/18 at 11:30 instead.

## 2023-02-07 NOTE — Telephone Encounter (Signed)
No auth required for Medicare and Tricare. I called and LVM letting the pt know I was scheduling her for 12/19 with Dr. Lucia Gaskins. Also sent MyChart msg informing pt.

## 2023-02-07 NOTE — Telephone Encounter (Signed)
-----   Message from Anson Fret sent at 02/06/2023  8:06 PM EST ----- Regarding: Start Botox - can we get it approved by the 20th??? Pod 4 and Jillian: Start botox for migraine approval. Patient desperately wants it before she leaves for the holidays, is there a possibility we can get it approved before the 20th? I can inject her botox due to her past medical comorbidities.  For the last 5 months: She has >10 migraine days a month lasting up to 24 hours, no aura with the migraines, no medication overuse, and > 15 total headache days a month.

## 2023-02-07 NOTE — Telephone Encounter (Signed)
Chronic Migraine CPT 64615    Botox J0585 Units:155 units   G43.709 Chronic Migraine without aura, not intractable, without status migrainous

## 2023-02-19 ENCOUNTER — Other Ambulatory Visit: Payer: Self-pay | Admitting: Nurse Practitioner

## 2023-02-19 DIAGNOSIS — G43111 Migraine with aura, intractable, with status migrainosus: Secondary | ICD-10-CM

## 2023-02-19 DIAGNOSIS — M8589 Other specified disorders of bone density and structure, multiple sites: Secondary | ICD-10-CM | POA: Diagnosis not present

## 2023-02-19 DIAGNOSIS — Z780 Asymptomatic menopausal state: Secondary | ICD-10-CM | POA: Diagnosis not present

## 2023-02-19 NOTE — Telephone Encounter (Signed)
Pharmacy requested refill  Rx pended for provider approval.

## 2023-02-21 ENCOUNTER — Ambulatory Visit: Payer: Medicare Other | Admitting: Neurology

## 2023-02-21 VITALS — BP 147/69 | HR 78

## 2023-02-21 DIAGNOSIS — G43019 Migraine without aura, intractable, without status migrainosus: Secondary | ICD-10-CM

## 2023-02-21 DIAGNOSIS — G43709 Chronic migraine without aura, not intractable, without status migrainosus: Secondary | ICD-10-CM | POA: Diagnosis not present

## 2023-02-21 MED ORDER — ONABOTULINUMTOXINA 100 UNITS IJ SOLR
50.0000 [IU] | Freq: Once | INTRAMUSCULAR | Status: AC
Start: 2023-02-21 — End: 2023-02-21
  Administered 2023-02-21: 50 [IU] via INTRAMUSCULAR

## 2023-02-21 NOTE — Progress Notes (Unsigned)
Botox- 100 units x 1 vial Lot: [id number] Expiration: 06/2025 NDC: 4010-2725-36  Bacteriostatic 0.9% Sodium Chloride- 2 mL  Lot: [id number] Expiration: 01/05/2024 NDC: 7425-9563-87  Dx: F64.332 B/B Witnessed by Berna Spare, CMA

## 2023-02-21 NOTE — Progress Notes (Unsigned)
Discussed can cause weakness given her neuromuscular disorder but for first injection will limit today. Will perform frontalis in the hairline 50 units. We discussed the risks extensively. It is contraindicated in neuromuscular disease. It can cause ptosis and weakness and dysphagia. We can try a limited amount in the head. I would not even offer hoever she is debilitated and has tried and failed every other medication. Neurology in [place] who treat her for neuromuscular disorder and she was supportive of trying botox.  Patient states neurology sent her here for botox.  I was very honest with patient today, Botox is contraindicated in neuromuscular disorders, my clinical reasoning feels as though if we put it very high behind the hairline and frontalis and very high and occipitalis and do a limited dosing it should not cause significant weakness and it should not cause limb weakness or dysphagia however there have been no clinical trials in patients with neuromuscular disorders and I have never performed Botox for migraine and neuromuscular disorders.  There is a risk of facial droop, forehead droop, ptosis, dysphagia (however I am not going anywhere near the muscles needed for swallowing and staying very high in the scalp and frontalis and occipitalis) and I am not going anywhere near her neck or her limb muscles or her cervical paraspinals or even her trapezius.  I have agreed to do this because patient is debilitated by her migraines and dizziness but I was very upfront with patient and all the risks and that Botox is contraindicated in her condition.  Patient feels she is so debilitated she has to try something, she understands the risks, I did tell her that it takes Botox 7 to 14 days to really kick in in the last 3 months and so any residual weakness may last several months.  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  Patient was crying at last appointment and at the front desk because she wanted Botox so badly, and today she is very insistent that she would like to try it despite the risks, we will do a limited amount and see how she does.  Consent Form Botulism Toxin Injection For Chronic Migraine    Reviewed orally with patient, additionally signature is on file:  Botulism toxin has been approved by the Federal drug administration for treatment of chronic migraine. Botulism toxin does not cure chronic migraine and it may not be effective in some patients.  The administration of botulism toxin is accomplished by injecting a small amount of toxin into the muscles of the neck and head. Dosage must be titrated for each individual. Any benefits resulting from botulism toxin tend to wear off after 3 months with a repeat injection required if benefit is to be maintained. Injections are usually done every 3-4 months with maximum effect peak achieved by about 2 or 3 weeks. Botulism toxin is expensive and you should be sure of what costs you will incur resulting from the injection.  The side effects of botulism toxin use for chronic migraine may include:   -Transient, and usually mild, facial weakness with facial injections  -Transient, and usually  mild, head or neck weakness with head/neck injections  -Reduction or loss of forehead facial animation due to forehead muscle weakness  -Eyelid drooping  -Dry eye  -Pain at the site of injection or bruising at the site of injection  -Double vision  -Potential unknown long term risks  Contraindications: You should not have Botox if you are pregnant, nursing, allergic to albumin, have an infection,  skin condition, or muscle weakness at the site of the injection, or have myasthenia gravis, Lambert-Eaton syndrome, or ALS.  It is also possible that as with any injection, there may be an allergic reaction or no effect from the medication. Reduced effectiveness after repeated injections is sometimes seen and rarely infection at the injection site may occur. All care will be taken to prevent these side effects. If therapy is given over a long time, atrophy and wasting in the muscle injected may occur. Occasionally the patient's become refractory to treatment because they develop antibodies to the toxin. In this event, therapy needs to be modified.  I have read the above information and consent to the administration of botulism toxin.    BOTOX PROCEDURE NOTE FOR MIGRAINE HEADACHE    Contraindications and precautions discussed with patient(above). Aseptic procedure was observed and patient tolerated procedure. Procedure performed by Dr. 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  Risks and benefits of injections discussed and pt agrees to proceed with the procedure.  Written consent obtained  These injections are medically necessary. These injections do not cause sedations or hallucinations which the oral therapies may cause.  Description of procedure:  The patient was placed in a sitting position. The standard protocol was used for Botox as follows, with 5 units of Botox injected at each site:   -Frontalis muscle, bilateral injection, with 2 sites each side, medial injection was performed in the upper one third of the frontalis muscle, in the region vertical from the medial inferior edge of the superior orbital rim. The lateral injection was again in the upper one third of the forehead vertically above the lateral limbus of the cornea, 1.5 cm lateral to the medial injection site.  -Occipitalis muscle injection, 3 sites,  bilaterally. The first injection was done one half way between the occipital protuberance and the tip of the mastoid process behind the ear. The second injection site was done lateral and superior to the first, 1 fingerbreadth from the first injection. The third injection site was 1 fingerbreadth superiorly and medially from the first injection site.  Will return for repeat injection in 3 months.   100 units of Botox was used, 50 U Botox not injected was wasted. The patient tolerated the procedure well, there were no complications of the above procedure.

## 2023-02-21 NOTE — Patient Instructions (Addendum)
Non pharmaceutical treatments for migraines  Cefaly    Nerivio

## 2023-02-22 ENCOUNTER — Ambulatory Visit: Payer: Medicare Other | Admitting: Neurology

## 2023-02-22 ENCOUNTER — Telehealth: Payer: Self-pay | Admitting: *Deleted

## 2023-02-22 NOTE — Telephone Encounter (Signed)
I called pt and she said no side effects, is fine, little headache R neck (to be expected).  She appreciated call back.   Did get VM and she said she did LM.

## 2023-03-12 ENCOUNTER — Ambulatory Visit (INDEPENDENT_AMBULATORY_CARE_PROVIDER_SITE_OTHER): Payer: Medicare Other

## 2023-03-12 DIAGNOSIS — I4891 Unspecified atrial fibrillation: Secondary | ICD-10-CM | POA: Diagnosis not present

## 2023-03-12 LAB — CUP PACEART REMOTE DEVICE CHECK
Date Time Interrogation Session: 20250105230820
Implantable Pulse Generator Implant Date: 20221111

## 2023-03-23 ENCOUNTER — Ambulatory Visit (INDEPENDENT_AMBULATORY_CARE_PROVIDER_SITE_OTHER): Payer: Medicare Other | Admitting: Nurse Practitioner

## 2023-03-23 ENCOUNTER — Other Ambulatory Visit: Payer: Self-pay | Admitting: Nurse Practitioner

## 2023-03-23 ENCOUNTER — Encounter: Payer: Self-pay | Admitting: Nurse Practitioner

## 2023-03-23 VITALS — BP 110/70 | HR 82 | Temp 97.9°F | Ht 66.0 in | Wt 118.2 lb

## 2023-03-23 DIAGNOSIS — M19042 Primary osteoarthritis, left hand: Secondary | ICD-10-CM | POA: Diagnosis not present

## 2023-03-23 DIAGNOSIS — G43111 Migraine with aura, intractable, with status migrainosus: Secondary | ICD-10-CM | POA: Diagnosis not present

## 2023-03-23 DIAGNOSIS — G7100 Muscular dystrophy, unspecified: Secondary | ICD-10-CM

## 2023-03-23 DIAGNOSIS — I480 Paroxysmal atrial fibrillation: Secondary | ICD-10-CM | POA: Diagnosis not present

## 2023-03-23 DIAGNOSIS — E559 Vitamin D deficiency, unspecified: Secondary | ICD-10-CM | POA: Diagnosis not present

## 2023-03-23 DIAGNOSIS — M19041 Primary osteoarthritis, right hand: Secondary | ICD-10-CM | POA: Diagnosis not present

## 2023-03-23 DIAGNOSIS — E871 Hypo-osmolality and hyponatremia: Secondary | ICD-10-CM

## 2023-03-23 DIAGNOSIS — I10 Essential (primary) hypertension: Secondary | ICD-10-CM

## 2023-03-23 NOTE — Progress Notes (Unsigned)
Careteam: Patient Care Team: [name], NP as PCP - General (Geriatric Medicine) Lanier Prude, MD as PCP - Electrophysiology (Cardiology) Meriam Sprague, MD (Inactive) as PCP - Cardiology (Cardiology) Celso Amy, PA-C as Consulting Physician (Physician Assistant) Arminda Resides, MD as Consulting Physician (Dermatology) Osborn Coho, MD as Consulting Physician (Obstetrics and Gynecology) Ermalinda Barrios, MD as Consulting Physician (Otolaryngology) Chilton Greathouse, MD as Consulting Physician (Pulmonary Disease) Edythe Lynn, MD as Referring Physician (Neurology)  PLACE OF SERVICE:  [facility]  Advanced Directive information Does Patient Have a Medical Advance Directive?: Yes, Would patient like information on creating a medical advance directive?: No - Patient declined, Type of Advance Directive: Healthcare Power of [place];Living will, Does patient want to make changes to medical advance directive?: No - Patient declined  Allergies  Allergen Reactions   Biaxin [Clarithromycin] Anaphylaxis   Emgality [Galcanezumab-Gnlm] Other (See Comments)    Dizziness/ n/v, passed out   Sulfonamide Derivatives Itching and Rash   Tequin [Gatifloxacin] Anaphylaxis   Demerol Nausea And Vomiting   Lactose Intolerance (Gi) Other (See Comments)    Mild abdominal pain, gas, bloating   Nsaids Other (See Comments)    GI upset/stomach pain   Risedronate Sodium Nausea And Vomiting and Other (See Comments)     GI upset/heartburn    Rofecoxib Nausea And Vomiting and Other (See Comments)    GI upset/heartburn    Alendronate Sodium Nausea And Vomiting   Aspirin Other (See Comments)    Abdominal pain    Cardizem [Diltiazem Hcl] Hives   Eliquis [Apixaban] Hives   Fish Allergy Nausea And Vomiting    Can take fish oil   Benadryl [Diphenhydramine Hcl] Palpitations   Other Itching and Rash    EKG LEADS caused burning sores     Chief Complaint  Patient presents with   Medical  Management of Chronic Issues    4 month follow up and discuss covid and shingles vaccines and medicare annual wellness visit.     HPI: Patient is a 78 y.o. female for routine follow up.   Her hearing aides are eching and she is planing to get another piece next week.   [facility]- osteopenia noted.  She had been walking but has not been consistent   She has had a a lot of vertigo and migraines She is seeing Dr Lucia Gaskins for botox to help with migraine.  She was very cautious and use small dosing due to her MD.  Results are questionable - she had very bad migraines around her birthday and [place] did not help prior but now when she takes her   She also having a lot of vertigo.  Taking meclizine PRN.  Will take at the earliest sign of dizziness.  Also getting nauseous with her dizziness and migraines, zofran help but not as good as they have in the past.    Review of Systems:  Review of Systems  Constitutional:  Negative for chills, fever and weight loss.  HENT:  Negative for tinnitus.   Respiratory:  Negative for cough, sputum production and shortness of breath.   Cardiovascular:  Negative for chest pain, palpitations and leg swelling.  Gastrointestinal:  Negative for abdominal pain, constipation, diarrhea and heartburn.  Genitourinary:  Negative for dysuria, frequency and urgency.  Musculoskeletal:  Negative for back pain, falls, joint pain and myalgias.  Skin: Negative.   Neurological:  Positive for dizziness, weakness and headaches.  Psychiatric/Behavioral:  Negative for depression and memory loss. The patient does not have insomnia.     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   Chronic migraine without aura, intractable, without status migrainosus    Diverticulosis 2010   Endometriosis    Low sodium levels    Malnutrition (HCC)    MD (muscular dystrophy) (HCC)    Meniere's  disease, bilateral    [facility]    [facility]   Peripheral neuropathy    Dr Sandria Manly   Pneumonia    Sensorineural hearing loss, bilateral    Vertigo 2019   Past Surgical History:  Procedure Laterality Date   ABDOMINAL HYSTERECTOMY  1980 or 1981   for Endometriosis   APPENDECTOMY  1965   high school; low grade appendiceal cancer   ATRIAL FIBRILLATION ABLATION N/A 06/17/2020   Procedure: ATRIAL FIBRILLATION ABLATION;  Surgeon: Hillis Range, MD;  Location: [facility];  Service: Cardiovascular;  Laterality: N/A;   BREAST ENHANCEMENT SURGERY Bilateral 1979   COLONOSCOPY  01/2012   Tics; Dr Dorena Cookey   ESI      X 3 in 2003 & 02/17/2011 for L4-S1 symptoms   implantable loop recorder placement  01/14/2021   Medtronic Reveal Linq model LNQ 22 ([place] [id number]) implantable loop recorder   LUMBAR LAMINECTOMY  12/2012   [place] , [place]   TONSILLECTOMY  1950 or 1951   TUBAL LIGATION     vocal cords stripped  1970   Social History:   reports that she has never smoked. She has never used smokeless tobacco. She reports that she does not currently use alcohol. She reports that she does not use drugs.  Family History  Problem Relation Age of Onset   Alcohol abuse Mother    Hypertension Mother    Diabetes Mother 50   Stroke Mother 33   Dementia Mother    Kidney failure Mother    Osteoporosis Mother    Alzheimer's disease Father 35   Cancer Maternal Aunt 86       pancreatic   Arthritis Sister 47       knee replacement   Hyperlipidemia Sister    Lung disease Maternal Grandmother    Lung disease Maternal Grandfather    Hyperlipidemia Sister 59   Other Son        recent back injury   Other Daughter        recent back injury; knee issues when playing tennis   Heart disease Neg Hx    Heart attack Neg Hx     Medications: Patient's Medications  New Prescriptions   No medications on file  Previous Medications   ACETAMINOPHEN (TYLENOL) 500 MG TABLET    Take 500 mg by mouth  at bedtime as needed for moderate pain.   AMLODIPINE (NORVASC) 2.5 MG TABLET    Take 1 tablet (2.5 mg total) by mouth daily.   BACLOFEN (LIORESAL) 10 MG TABLET    Take 10 mg by mouth at bedtime.   BUTALBITAL-ACETAMINOPHEN-CAFFEINE (FIORICET) 50-325-40 MG TABLET    TAKE 1 TABLET BY MOUTH EVERY 6 HOURS AS NEEDED FOR MIGRAINE   CALCIUM CITRATE-VITAMIN D (CALCIUM + D PO)    Take 1 tablet by mouth as directed. 3-4 times daily   CARBOXYMETHYLCELLULOSE SODIUM (THERATEARS) 0.25 % SOLN    Place 1 drop into both eyes 4 (four) times daily.   CHLORPHENIRAMINE (CHLOR-TRIMETON) 4 MG TABLET  Take 4-8 mg by mouth at bedtime.   CHOLECALCIFEROL (VITAMIN D3) LIQD    Take 1,000 Units by mouth daily.   FEMRING 0.05 MG/24HR RING    Place 1 each vaginally every 3 (three) months.   LUTEIN 20 MG TABS    Take 20 mg by mouth daily.    MAGNESIUM CITRATE PO    Take 300 mg by mouth daily.   METHOCARBAMOL (ROBAXIN) 750 MG TABLET    Take 750 mg by mouth every 6 (six) hours.   METOPROLOL SUCCINATE (TOPROL XL) 25 MG 24 HR TABLET    Take 0.5 tablets (12.5 mg total) by mouth 2 (two) times daily.   MULTIPLE VITAMIN (MULTIVITAMIN WITH MINERALS) TABS TABLET    Take 1 tablet by mouth daily.   MULTIPLE VITAMINS-MINERALS (PRESERVISION AREDS 2) CAPS    Take 1 capsule by mouth 2 (two) times daily.   ONDANSETRON (ZOFRAN-ODT) 4 MG DISINTEGRATING TABLET    DISSOLVE 1 TABLET(4 MG) ON THE TONGUE EVERY 6 HOURS AS NEEDED FOR NAUSEA OR VOMITING   SODIUM CHLORIDE 1 G TABLET    Take 1 g by mouth 4 (four) times daily.   XARELTO 20 MG TABS TABLET    TAKE 1 TABLET DAILY WITH SUPPER  Modified Medications   No medications on file  Discontinued Medications   No medications on file    Physical Exam:  Vitals:   03/23/23 1554  BP: 110/70  Pulse: 82  Temp: 97.9 F (36.6 C)  SpO2: 96%  Weight: 118 lb 3.2 oz (53.6 kg)  Height: 5\' 6"  (1.676 m)   Body mass index is 19.08 kg/m. Wt Readings from Last 3 Encounters:  03/23/23 118 lb 3.2 oz (53.6  kg)  02/06/23 116 lb (52.6 kg)  11/15/22 117 lb 9.6 oz (53.3 kg)    Physical Exam Constitutional:      General: She is not in acute distress.    Appearance: She is well-developed. She is not diaphoretic.  HENT:     Head: Normocephalic and atraumatic.     Mouth/Throat:     Pharynx: No oropharyngeal exudate.  Eyes:     Conjunctiva/sclera: Conjunctivae normal.     Pupils: Pupils are equal, round, and reactive to light.  Cardiovascular:     Rate and Rhythm: Normal rate and regular rhythm.     Heart sounds: Normal heart sounds.  Pulmonary:     Effort: Pulmonary effort is normal.     Breath sounds: Normal breath sounds.  Abdominal:     General: Bowel sounds are normal.     Palpations: Abdomen is soft.  Musculoskeletal:     Cervical back: Normal range of motion and neck supple.     Right lower leg: No edema.     Left lower leg: No edema.  Skin:    General: Skin is warm and dry.  Neurological:     Mental Status: She is alert.  Psychiatric:        Mood and Affect: Mood normal.     Labs reviewed: Basic Metabolic Panel: Recent Labs    03/31/22 1502 04/01/22 0006 04/19/22 1628 07/17/22 1514 08/30/22 1508 11/16/22 1332  NA 130* 128*  --  138 134* 130*  K 4.5 4.2  --  4.9 5.0 4.8  CL 93* 92*  --  101 97* 93*  CO2 29 28  --  31 29 32  GLUCOSE 102 113*  --  100* 101* 133*  BUN 20 21  --  27* 25 20  CREATININE 0.55* 0.63  --  0.59* 0.61 0.55*  CALCIUM 9.8 9.2  --  9.7 9.6 9.6  MG 2.4 2.7*  --   --   --   --   TSH  --   --  2.830  --   --   --    Liver Function Tests: Recent Labs    11/16/22 1332  AST 18  ALT 13  BILITOT 0.4  PROT 6.8   No results for input(s): "LIPASE", "AMYLASE" in the last 8760 hours. No results for input(s): "AMMONIA" in the last 8760 hours. CBC: Recent Labs    04/01/22 0006 11/16/22 1332  WBC 6.3 5.8  NEUTROABS 5.3 4,565  HGB 12.7 13.0  HCT 38.5 39.7  MCV 92.3 93.4  PLT 242 275   Lipid Panel: No results for input(s): "CHOL",  "HDL", "LDLCALC", "TRIG", "CHOLHDL", "LDLDIRECT" in the last 8760 hours. TSH: Recent Labs    04/19/22 1628  TSH 2.830   A1C: Lab Results  Component Value Date   HGBA1C 5.6 03/03/2011     Assessment/Plan 1. Chronic hyponatremia (Primary) Continues on sodium tablet - COMPLETE METABOLIC PANEL WITH GFR  2. Primary hypertension -Blood pressure well controlled, goal bp <140/90 Continue current medications and dietary modifications follow metabolic panel - COMPLETE METABOLIC PANEL WITH GFR - CBC with Differential/Platelet  3. Muscular dystrophy (HCC) Ongoing and stable  4. Paroxysmal atrial fibrillation (HCC) Rate controlled, continues on xarelto for anticoagulation.   5. Intractable migraine with aura with status migrainosus Ongoing, working with neurology to get under control.  Recent botox injection and using ubrelvy PRN   6. Vitamin D deficiency Continues on supplement   7. Primary osteoarthritis of both hands use tylenol PRN Or can try Voltaren gel 1%- this is over the counter can use on joints up to 4 times daily as needed   Return in about 4 months (around 07/21/2023) for routine follow up .  Janene Harvey. Biagio Borg [facility] [phone]

## 2023-03-23 NOTE — Patient Instructions (Addendum)
Voltaren gel 1%- this is over the counter can use on joints up to 4 times daily

## 2023-03-24 LAB — CBC WITH DIFFERENTIAL/PLATELET
Absolute Lymphocytes: 986 {cells}/uL (ref 850–3900)
Absolute Monocytes: 589 {cells}/uL (ref 200–950)
Basophils Absolute: 32 {cells}/uL (ref 0–200)
Basophils Relative: 0.5 %
Eosinophils Absolute: 122 {cells}/uL (ref 15–500)
Eosinophils Relative: 1.9 %
HCT: 38.2 % (ref 35.0–45.0)
Hemoglobin: 12.5 g/dL (ref 11.7–15.5)
MCH: 31 pg (ref 27.0–33.0)
MCHC: 32.7 g/dL (ref 32.0–36.0)
MCV: 94.8 fL (ref 80.0–100.0)
MPV: 10.6 fL (ref 7.5–12.5)
Monocytes Relative: 9.2 %
Neutro Abs: 4672 {cells}/uL (ref 1500–7800)
Neutrophils Relative %: 73 %
Platelets: 338 10*3/uL (ref 140–400)
RBC: 4.03 10*6/uL (ref 3.80–5.10)
RDW: 12.1 % (ref 11.0–15.0)
Total Lymphocyte: 15.4 %
WBC: 6.4 10*3/uL (ref 3.8–10.8)

## 2023-03-24 LAB — COMPLETE METABOLIC PANEL WITH GFR
AG Ratio: 1.5 (calc) (ref 1.0–2.5)
ALT: 11 U/L (ref 6–29)
AST: 18 U/L (ref 10–35)
Albumin: 4.1 g/dL (ref 3.6–5.1)
Alkaline phosphatase (APISO): 64 U/L (ref 37–153)
BUN/Creatinine Ratio: 46 (calc) — ABNORMAL HIGH (ref 6–22)
BUN: 26 mg/dL — ABNORMAL HIGH (ref 7–25)
CO2: 32 mmol/L (ref 20–32)
Calcium: 9.8 mg/dL (ref 8.6–10.4)
Chloride: 97 mmol/L — ABNORMAL LOW (ref 98–110)
Creat: 0.57 mg/dL — ABNORMAL LOW (ref 0.60–1.00)
Globulin: 2.8 g/dL (ref 1.9–3.7)
Glucose, Bld: 77 mg/dL (ref 65–139)
Potassium: 5.1 mmol/L (ref 3.5–5.3)
Sodium: 133 mmol/L — ABNORMAL LOW (ref 135–146)
Total Bilirubin: 0.4 mg/dL (ref 0.2–1.2)
Total Protein: 6.9 g/dL (ref 6.1–8.1)
eGFR: 93 mL/min/{1.73_m2} (ref >=60)

## 2023-03-26 ENCOUNTER — Encounter: Payer: Self-pay | Admitting: Nurse Practitioner

## 2023-03-26 ENCOUNTER — Other Ambulatory Visit (HOSPITAL_COMMUNITY): Payer: Self-pay | Admitting: Cardiology

## 2023-03-26 DIAGNOSIS — I480 Paroxysmal atrial fibrillation: Secondary | ICD-10-CM

## 2023-03-26 NOTE — Telephone Encounter (Signed)
Prescription refill request for Xarelto received.  Indication:afib Last office visit:7/24 Weight:53.6  kg Age:78 Scr:0.55  9/24 CrCl:71.33  ml/min  Prescription refilled

## 2023-04-01 NOTE — Progress Notes (Unsigned)
Cardiology Office Note:    Date:  04/03/2023   ID:  Alicia [name], DOB March 17, 1945, MRN [id number]  PCP:  [name], NP   [facility] Providers Electrophysiologist:  Lanier Prude, MD {  Referring MD: [name], NP    History of Present Illness:    Alicia [name] is a 78 y.o. female with a hx of paroxysmal Afib s/p ablation, PVCs, HTN (labile BP), and peripheral neuropathy She is also followed by Dr. Lalla Brothers (previously J Allred and T Brackbill and H [facility])  Pt has a loop recorder    2022  s/p Ablation for PAF   Remains on Xarelto  Last seen by Jeanie Cooks in July 2024   Pt complained  of palpitations at time  Monitor showed 3% PVC burden    She says she is very sensitive to the skips  Last week she had spell of vertigo   Was in bed for 5 days   Says she is weak Overall says she is not having a lot of heart skipping   Breathing is OK No CP   rare dizziness   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   Chronic migraine without aura, intractable, without status migrainosus    Diverticulosis 2010   Diverticulosis of colon without hemorrhage 08/20/2012   Dr Dorena Cookey, GI     Endometriosis    Low sodium levels    Malnutrition (HCC)    MD (muscular dystrophy) (HCC)    Meniere's disease, bilateral    [facility]   Peripheral neuropathy    Dr Sandria Manly   Pneumonia    Sensorineural hearing loss, bilateral    Vertigo 2019    Past Surgical History:  Procedure Laterality Date   ABDOMINAL HYSTERECTOMY  1980 or 1981   for Endometriosis   APPENDECTOMY  1965   high school; low grade appendiceal cancer   ATRIAL FIBRILLATION ABLATION N/A 06/17/2020   Procedure: ATRIAL FIBRILLATION ABLATION;  Surgeon: Hillis Range, MD;  Location: [facility];  Service: Cardiovascular;  Laterality: N/A;   BREAST ENHANCEMENT SURGERY Bilateral 1979   COLONOSCOPY  01/2012   Tics; Dr Dorena Cookey   ESI      X 3 in 2003 & 02/17/2011 for L4-S1 symptoms   implantable loop recorder placement  01/14/2021   Medtronic Reveal Linq model LNQ 22 ([place] [id number]) implantable loop recorder   LUMBAR LAMINECTOMY  12/2012   [place] , [place]   TONSILLECTOMY  1950 or 1951   TUBAL LIGATION     vocal cords stripped  1970    Current Medications: Current Meds  Medication Sig   acetaminophen (TYLENOL) 500 MG tablet Take 500 mg by mouth at bedtime as needed for moderate pain.   amLODipine (NORVASC) 2.5 MG tablet Take 1 tablet (2.5 mg total) by mouth daily.   baclofen (LIORESAL) 10 MG tablet Take 10 mg by mouth at bedtime.   butalbital-acetaminophen-caffeine (FIORICET) 50-325-40 MG tablet TAKE 1 TABLET BY MOUTH EVERY 6 HOURS AS NEEDED FOR MIGRAINE   Calcium Citrate-Vitamin D (CALCIUM + D PO) Take 1 tablet by mouth as directed. 3-4 times daily   Carboxymethylcellulose Sodium (THERATEARS) 0.25 % SOLN Place 1 drop into both eyes 4 (four) times daily.   chlorpheniramine (CHLOR-TRIMETON) 4 MG tablet Take 4-8 mg by mouth at bedtime.   Cholecalciferol (VITAMIN D3) LIQD Take 1,000  Units by mouth daily.   FEMRING 0.05 MG/24HR RING Place 1 each vaginally every 3 (three) months.   Lutein 20 MG TABS Take 20 mg by mouth daily.    MAGNESIUM CITRATE PO Take 300 mg by mouth daily.   methocarbamol (ROBAXIN) 750 MG tablet Take 750 mg by mouth every 6 (six) hours.   metoprolol succinate (TOPROL XL) 25 MG 24 hr tablet Take 0.5 tablets (12.5 mg total) by mouth 2 (two) times daily.   Multiple Vitamin (MULTIVITAMIN WITH MINERALS) TABS tablet Take 1 tablet by mouth daily.   Multiple Vitamins-Minerals (PRESERVISION AREDS 2) CAPS Take 1 capsule by mouth 2 (two) times daily.   ondansetron (ZOFRAN-ODT) 4 MG disintegrating tablet DISSOLVE 1 TABLET(4 MG) ON THE TONGUE EVERY 6 HOURS AS NEEDED FOR NAUSEA OR VOMITING   sodium chloride 1 g tablet Take 1 g by mouth 4 (four) times daily.   Ubrogepant (UBRELVY) 100 MG TABS Take 1 tablet by  mouth as needed.   XARELTO 20 MG TABS tablet TAKE 1 TABLET DAILY WITH SUPPER     Allergies:   Biaxin [clarithromycin], Emgality [galcanezumab-gnlm], Sulfonamide derivatives, Tequin [gatifloxacin], Demerol, Lactose intolerance (gi), Nsaids, Risedronate sodium, Rofecoxib, Alendronate sodium, Aspirin, Cardizem [diltiazem hcl], Eliquis [apixaban], Fish allergy, Benadryl [diphenhydramine hcl], and Other   Social History   Socioeconomic History   Marital status: Married    Spouse name: Not on file   Number of children: Not on file   Years of education: Not on file   Highest education level: Bachelor's degree (e.g., BA, AB, BS)  Occupational History   Not on file  Tobacco Use   Smoking status: Never   Smokeless tobacco: Never  Vaping Use   Vaping status: Never Used  Substance and Sexual Activity   Alcohol use: Not Currently   Drug use: Never   Sexual activity: Not Currently  Other Topics Concern   Not on file  Social History Narrative   Social History      Diet? Trying to gain weight      Do you drink/eat things with caffeine? 1 mug hot tea with breakfast per day      Marital status?  Married 49 years                                  What year were you married? 1970      Do you live in a house, apartment, assisted living, condo, trailer, etc.? House (46 years)      Is it one or more stories? 2 stories and walk up finished attic      How many persons live in your home? 2      Do you have any pets in your home? (please list) none      Highest level of education completed? 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     yes                                 Type & how often? Walk in neighborhood 4 - 6 times per week if possible      Advanced Directives (Lawyer just beginning to work on these things)      Do you have a living will?  Do you have a DNR form?                     no              If not, do you want to discuss one?      Do you have signed POA/HPOA for forms?       Functional Status      Do you have difficulty bathing or dressing yourself? no      Do you have difficulty preparing food or eating? no      Do you have difficulty managing your medications? no      Do you have difficulty managing your finances? no      Do you have difficulty affording your medications? no   Social Drivers of Corporate investment banker Strain: Low Risk  (03/22/2023)   Overall Financial Resource Strain (CARDIA)    Difficulty of Paying Living Expenses: Not hard at all  Food Insecurity: No Food Insecurity (03/22/2023)   Hunger Vital Sign    Worried About Running Out of Food in the Last Year: Never true    Ran Out of Food in the Last Year: Never true  Transportation Needs: No Transportation Needs (03/22/2023)   PRAPARE - Administrator, Civil Service (Medical): No    Lack of Transportation (Non-Medical): No  Physical Activity: Insufficiently Active (03/22/2023)   Exercise Vital Sign    Days of Exercise per Week: 4 days    Minutes of Exercise per Session: 20 min  Stress: No Stress Concern Present (03/22/2023)   [employer] of Occupational Health - Occupational Stress Questionnaire    Feeling of Stress : Not at all  Social Connections: Unknown (03/22/2023)   Social Connection and Isolation Panel [NHANES]    Frequency of Communication with Friends and Family: More than three times a week    Frequency of Social Gatherings with Friends and Family: Twice a week    Attends Religious Services: Patient declined    Database administrator or Organizations: Yes    Attends Engineer, structural: 1 to 4 times per year    Marital Status: Married     Family History: The patient's family history includes Alcohol abuse in her mother; Alzheimer's disease (age of onset: 17) in her father; Arthritis (age of onset: 71) in her sister; Cancer (age of  onset: 74) in her maternal aunt; Dementia in her mother; Diabetes (age of onset: 86) in her mother; Hyperlipidemia in her sister; Hyperlipidemia (age of onset: 52) in her sister; Hypertension in her mother; Kidney failure in her mother; Lung disease in her maternal grandfather and maternal grandmother; Osteoporosis in her mother; Other in her daughter and son; Stroke (age of onset: 25) in her mother. There is no history of Heart disease or Heart attack.  ROS:   Please see the history of present illness.     All other systems reviewed and are negative.  EKGs/Labs/Other Studies Reviewed:    Ca score 06/2020: IMPRESSION: 1. There is normal pulmonary vein drainage into the left atrium with ostial measurements above.   2. There is no thrombus in the left atrial appendage.   3. The esophagus runs in the left atrial midline and is not in proximity to any of the pulmonary vein ostia.   4. There is a small PFO.   5. Normal coronary origin. Right dominance.   6. CAC score of 0.  EKG:  EKG  is not ordered today.    Recent Labs: 04/19/2022: TSH 2.830 03/23/2023: ALT 11; BUN 26; Creat 0.57; Hemoglobin 12.5; Platelets 338; Potassium 5.1; Sodium 133  Recent Lipid Panel    Component Value Date/Time   CHOL 192 03/19/2018 1005   TRIG 52 03/19/2018 1005   TRIG 46 01/29/2006 1132   HDL 88 03/19/2018 1005   CHOLHDL 2.2 03/19/2018 1005   VLDL 9.4 08/20/2012 1032   LDLCALC 91 03/19/2018 1005   LDLDIRECT 124.7 08/20/2012 1032     Risk Assessment/Calculations:    CHA2DS2-VASc Score = 4   This indicates a 4.8% annual risk of stroke. The patient's score is based upon: CHF History: 0 HTN History: 1 Diabetes History: 0 Stroke History: 0 Vascular Disease History: 0 Age Score: 2 Gender Score: 1         Physical Exam:    VS:  BP 119/63   Pulse 79   Ht 5\' 6"  (1.676 m)   Wt 118 lb 6.4 oz (53.7 kg)   SpO2 97%   BMI 19.11 kg/m     Wt Readings from Last 3 Encounters:  04/03/23 118 lb  6.4 oz (53.7 kg)  03/23/23 118 lb 3.2 oz (53.6 kg)  02/06/23 116 lb (52.6 kg)     GEN: Thin 78 yo in no acute distress HEENT: Normal NECK: No JVD; No carotid bruits CARDIAC: RRR, no murmurs, RESPIRATORY:  Clear to auscultation ABDOMEN: Soft, non-tender, non-distended   PLAN:    #Paroxysmal Afib: CHADs-vasc 4. S/p ablation in 06/2020 with no recurrence of Afib on loop recorder.  Pt takes 1/2 of toprol XL   -Continue xarelto 20mg  daily -Follow-up with Dr. Lalla Brothers   #PVCs: Pt has LINQ in place Off metoprolol and symptoms overall controlled.   #HTN:  Pt's has been labile in past    It is good today   -Continue amlodipine 2. 5mg  daily      Medication Adjustments/Labs and Tests Ordered: Current medicines are reviewed at length with the patient today.  Concerns regarding medicines are outlined above.     Signed, Dietrich Pates, MD  04/03/2023 3:08 PM    [facility]

## 2023-04-03 ENCOUNTER — Ambulatory Visit: Payer: Medicare Other | Attending: Internal Medicine | Admitting: Internal Medicine

## 2023-04-03 ENCOUNTER — Encounter: Payer: Self-pay | Admitting: Internal Medicine

## 2023-04-03 VITALS — BP 119/63 | HR 79 | Ht 66.0 in | Wt 118.4 lb

## 2023-04-03 DIAGNOSIS — R002 Palpitations: Secondary | ICD-10-CM | POA: Diagnosis not present

## 2023-04-03 NOTE — Patient Instructions (Signed)
Medication Instructions:   *If you need a refill on your cardiac medications before your next appointment, please call your pharmacy*   Lab Work:  If you have labs (blood work) drawn today and your tests are completely normal, you will receive your results only by: MyChart Message (if you have MyChart) OR A paper copy in the mail If you have any lab test that is abnormal or we need to change your treatment, we will call you to review the results.   Testing/Procedures:    Follow-Up: At [facility], you and your health needs are our priority.  As part of our continuing mission to provide you with exceptional heart care, we have created designated Provider Care Teams.  These Care Teams include your primary Cardiologist (physician) and Advanced Practice Providers (APPs -  Physician Assistants and Nurse Practitioners) who all work together to provide you with the care you need, when you need it.  We recommend signing up for the patient portal called "MyChart".  Sign up information is provided on this After Visit Summary.  MyChart is used to connect with patients for Virtual Visits (Telemedicine).  Patients are able to view lab/test results, encounter notes, upcoming appointments, etc.  Non-urgent messages can be sent to your provider as well.   To learn more about what you can do with MyChart, go to [email or web address].    Your next appointment:   1 year(s)  Provider:   DR Dietrich Pates    Other Instructions

## 2023-04-16 ENCOUNTER — Ambulatory Visit (INDEPENDENT_AMBULATORY_CARE_PROVIDER_SITE_OTHER): Payer: Medicare Other

## 2023-04-16 DIAGNOSIS — R002 Palpitations: Secondary | ICD-10-CM

## 2023-04-16 LAB — CUP PACEART REMOTE DEVICE CHECK
Date Time Interrogation Session: 20250209231042
Implantable Pulse Generator Implant Date: 20221111

## 2023-04-18 NOTE — Progress Notes (Signed)
[facility] Summary Report / Loop Recorder

## 2023-04-19 ENCOUNTER — Encounter: Payer: Self-pay | Admitting: Cardiology

## 2023-04-24 DIAGNOSIS — H53143 Visual discomfort, bilateral: Secondary | ICD-10-CM | POA: Diagnosis not present

## 2023-04-24 DIAGNOSIS — H3540 Unspecified peripheral retinal degeneration: Secondary | ICD-10-CM | POA: Diagnosis not present

## 2023-04-24 DIAGNOSIS — H2513 Age-related nuclear cataract, bilateral: Secondary | ICD-10-CM | POA: Diagnosis not present

## 2023-04-27 ENCOUNTER — Telehealth: Payer: Self-pay

## 2023-04-27 NOTE — Telephone Encounter (Signed)
Called patient to advise her PVC burden is 2.3%. Patient appreciative of call back.

## 2023-04-27 NOTE — Telephone Encounter (Signed)
Pt called in wanting to know if the second half of her transmission can be sent to her the part that shows her PVC's. She states we do this for her all the time. I let pt know that a nurse will give her a call back

## 2023-04-29 ENCOUNTER — Encounter (HOSPITAL_COMMUNITY): Payer: Self-pay

## 2023-04-29 ENCOUNTER — Emergency Department (HOSPITAL_COMMUNITY): Payer: Medicare Other

## 2023-04-29 ENCOUNTER — Inpatient Hospital Stay (HOSPITAL_COMMUNITY)
Admission: EM | Admit: 2023-04-29 | Discharge: 2023-05-05 | DRG: 082 | Disposition: E | Payer: Medicare Other | Attending: Internal Medicine | Admitting: Internal Medicine

## 2023-04-29 ENCOUNTER — Other Ambulatory Visit: Payer: Self-pay

## 2023-04-29 DIAGNOSIS — Z515 Encounter for palliative care: Secondary | ICD-10-CM | POA: Diagnosis not present

## 2023-04-29 DIAGNOSIS — S299XXA Unspecified injury of thorax, initial encounter: Secondary | ICD-10-CM | POA: Diagnosis not present

## 2023-04-29 DIAGNOSIS — E872 Acidosis, unspecified: Secondary | ICD-10-CM | POA: Diagnosis present

## 2023-04-29 DIAGNOSIS — G935 Compression of brain: Secondary | ICD-10-CM | POA: Diagnosis present

## 2023-04-29 DIAGNOSIS — S0993XA Unspecified injury of face, initial encounter: Secondary | ICD-10-CM | POA: Diagnosis present

## 2023-04-29 DIAGNOSIS — Z7901 Long term (current) use of anticoagulants: Secondary | ICD-10-CM | POA: Diagnosis not present

## 2023-04-29 DIAGNOSIS — S0511XA Contusion of eyeball and orbital tissues, right eye, initial encounter: Secondary | ICD-10-CM | POA: Diagnosis present

## 2023-04-29 DIAGNOSIS — I619 Nontraumatic intracerebral hemorrhage, unspecified: Secondary | ICD-10-CM | POA: Diagnosis present

## 2023-04-29 DIAGNOSIS — G7100 Muscular dystrophy, unspecified: Secondary | ICD-10-CM | POA: Diagnosis present

## 2023-04-29 DIAGNOSIS — I10 Essential (primary) hypertension: Secondary | ICD-10-CM | POA: Diagnosis present

## 2023-04-29 DIAGNOSIS — R55 Syncope and collapse: Secondary | ICD-10-CM | POA: Diagnosis not present

## 2023-04-29 DIAGNOSIS — I629 Nontraumatic intracranial hemorrhage, unspecified: Secondary | ICD-10-CM | POA: Diagnosis not present

## 2023-04-29 DIAGNOSIS — G629 Polyneuropathy, unspecified: Secondary | ICD-10-CM | POA: Diagnosis present

## 2023-04-29 DIAGNOSIS — D689 Coagulation defect, unspecified: Secondary | ICD-10-CM | POA: Diagnosis not present

## 2023-04-29 DIAGNOSIS — I480 Paroxysmal atrial fibrillation: Secondary | ICD-10-CM | POA: Diagnosis present

## 2023-04-29 DIAGNOSIS — M4312 Spondylolisthesis, cervical region: Secondary | ICD-10-CM | POA: Diagnosis present

## 2023-04-29 DIAGNOSIS — S066X9A Traumatic subarachnoid hemorrhage with loss of consciousness of unspecified duration, initial encounter: Secondary | ICD-10-CM | POA: Diagnosis present

## 2023-04-29 DIAGNOSIS — J9602 Acute respiratory failure with hypercapnia: Secondary | ICD-10-CM | POA: Diagnosis present

## 2023-04-29 DIAGNOSIS — R000 Tachycardia, unspecified: Secondary | ICD-10-CM | POA: Diagnosis not present

## 2023-04-29 DIAGNOSIS — G936 Cerebral edema: Secondary | ICD-10-CM | POA: Diagnosis present

## 2023-04-29 DIAGNOSIS — T45515A Adverse effect of anticoagulants, initial encounter: Secondary | ICD-10-CM | POA: Diagnosis present

## 2023-04-29 DIAGNOSIS — G9349 Other encephalopathy: Secondary | ICD-10-CM | POA: Diagnosis present

## 2023-04-29 DIAGNOSIS — Z66 Do not resuscitate: Secondary | ICD-10-CM | POA: Diagnosis present

## 2023-04-29 DIAGNOSIS — S3991XA Unspecified injury of abdomen, initial encounter: Secondary | ICD-10-CM | POA: Diagnosis not present

## 2023-04-29 DIAGNOSIS — E785 Hyperlipidemia, unspecified: Secondary | ICD-10-CM | POA: Diagnosis present

## 2023-04-29 DIAGNOSIS — J9601 Acute respiratory failure with hypoxia: Secondary | ICD-10-CM | POA: Diagnosis present

## 2023-04-29 DIAGNOSIS — S3993XA Unspecified injury of pelvis, initial encounter: Secondary | ICD-10-CM | POA: Diagnosis not present

## 2023-04-29 DIAGNOSIS — M797 Fibromyalgia: Secondary | ICD-10-CM | POA: Diagnosis present

## 2023-04-29 DIAGNOSIS — S06369A Traumatic hemorrhage of cerebrum, unspecified, with loss of consciousness of unspecified duration, initial encounter: Principal | ICD-10-CM | POA: Diagnosis present

## 2023-04-29 DIAGNOSIS — W19XXXA Unspecified fall, initial encounter: Secondary | ICD-10-CM | POA: Diagnosis not present

## 2023-04-29 DIAGNOSIS — I613 Nontraumatic intracerebral hemorrhage in brain stem: Secondary | ICD-10-CM

## 2023-04-29 DIAGNOSIS — S199XXA Unspecified injury of neck, initial encounter: Secondary | ICD-10-CM | POA: Diagnosis not present

## 2023-04-29 DIAGNOSIS — Z4682 Encounter for fitting and adjustment of non-vascular catheter: Secondary | ICD-10-CM | POA: Diagnosis not present

## 2023-04-29 DIAGNOSIS — W1830XA Fall on same level, unspecified, initial encounter: Secondary | ICD-10-CM | POA: Diagnosis present

## 2023-04-29 DIAGNOSIS — D6832 Hemorrhagic disorder due to extrinsic circulating anticoagulants: Secondary | ICD-10-CM | POA: Diagnosis present

## 2023-04-29 DIAGNOSIS — S06389A Contusion, laceration, and hemorrhage of brainstem with loss of consciousness of unspecified duration, initial encounter: Secondary | ICD-10-CM | POA: Diagnosis present

## 2023-04-29 DIAGNOSIS — M50221 Other cervical disc displacement at C4-C5 level: Secondary | ICD-10-CM | POA: Diagnosis not present

## 2023-04-29 DIAGNOSIS — S066X0A Traumatic subarachnoid hemorrhage without loss of consciousness, initial encounter: Secondary | ICD-10-CM | POA: Diagnosis not present

## 2023-04-29 DIAGNOSIS — R0902 Hypoxemia: Secondary | ICD-10-CM | POA: Diagnosis not present

## 2023-04-29 DIAGNOSIS — S0990XA Unspecified injury of head, initial encounter: Secondary | ICD-10-CM | POA: Diagnosis not present

## 2023-04-29 DIAGNOSIS — R0689 Other abnormalities of breathing: Secondary | ICD-10-CM

## 2023-04-29 DIAGNOSIS — K760 Fatty (change of) liver, not elsewhere classified: Secondary | ICD-10-CM | POA: Diagnosis not present

## 2023-04-29 LAB — SAMPLE TO BLOOD BANK

## 2023-04-29 LAB — COMPREHENSIVE METABOLIC PANEL
ALT: 17 U/L (ref 0–44)
AST: 41 U/L (ref 15–41)
Albumin: 3.8 g/dL (ref 3.5–5.0)
Alkaline Phosphatase: 52 U/L (ref 38–126)
Anion gap: 10 (ref 5–15)
BUN: 18 mg/dL (ref 8–23)
CO2: 25 mmol/L (ref 22–32)
Calcium: 8.8 mg/dL — ABNORMAL LOW (ref 8.9–10.3)
Chloride: 100 mmol/L (ref 98–111)
Creatinine, Ser: 0.9 mg/dL (ref 0.44–1.00)
GFR, Estimated: >60 mL/min (ref >60)
Glucose, Bld: 176 mg/dL — ABNORMAL HIGH (ref 70–99)
Potassium: 4.3 mmol/L (ref 3.5–5.1)
Sodium: 135 mmol/L (ref 135–145)
Total Bilirubin: 0.4 mg/dL (ref 0.0–1.2)
Total Protein: 7 g/dL (ref 6.5–8.1)

## 2023-04-29 LAB — I-STAT CHEM 8, ED
BUN: 25 mg/dL — ABNORMAL HIGH (ref 8–23)
Calcium, Ion: 1.17 mmol/L (ref 1.15–1.40)
Chloride: 99 mmol/L (ref 98–111)
Creatinine, Ser: 0.8 mg/dL (ref 0.44–1.00)
Glucose, Bld: 175 mg/dL — ABNORMAL HIGH (ref 70–99)
HCT: 43 % (ref 36.0–46.0)
Hemoglobin: 14.6 g/dL (ref 12.0–15.0)
Potassium: 4.4 mmol/L (ref 3.5–5.1)
Sodium: 136 mmol/L (ref 135–145)
TCO2: 31 mmol/L (ref 22–32)

## 2023-04-29 LAB — I-STAT VENOUS BLOOD GAS, ED
Acid-Base Excess: 0 mmol/L (ref 0.0–2.0)
Bicarbonate: 31.1 mmol/L — ABNORMAL HIGH (ref 20.0–28.0)
Calcium, Ion: 1.16 mmol/L (ref 1.15–1.40)
HCT: 43 % (ref 36.0–46.0)
Hemoglobin: 14.6 g/dL (ref 12.0–15.0)
O2 Saturation: 99 %
Potassium: 4.5 mmol/L (ref 3.5–5.1)
Sodium: 136 mmol/L (ref 135–145)
TCO2: 33 mmol/L — ABNORMAL HIGH (ref 22–32)
pCO2, Ven: 80.2 mm[Hg] (ref 44–60)
pH, Ven: 7.196 — CL (ref 7.25–7.43)
pO2, Ven: 200 mm[Hg] — ABNORMAL HIGH (ref 32–45)

## 2023-04-29 LAB — CBC
HCT: 42 % (ref 36.0–46.0)
Hemoglobin: 13.4 g/dL (ref 12.0–15.0)
MCH: 31.3 pg (ref 26.0–34.0)
MCHC: 31.9 g/dL (ref 30.0–36.0)
MCV: 98.1 fL (ref 80.0–100.0)
Platelets: 292 10*3/uL (ref 150–400)
RBC: 4.28 MIL/uL (ref 3.87–5.11)
RDW: 13.6 % (ref 11.5–15.5)
WBC: 14.7 10*3/uL — ABNORMAL HIGH (ref 4.0–10.5)
nRBC: 0 % (ref 0.0–0.2)

## 2023-04-29 LAB — PROTIME-INR
INR: 1.3 — ABNORMAL HIGH (ref 0.8–1.2)
Prothrombin Time: 16 s — ABNORMAL HIGH (ref 11.4–15.2)

## 2023-04-29 LAB — I-STAT CG4 LACTIC ACID, ED: Lactic Acid, Venous: 2.4 mmol/L (ref 0.5–1.9)

## 2023-04-29 LAB — ETHANOL: Alcohol, Ethyl (B): <10 mg/dL (ref <10)

## 2023-04-29 MED ORDER — FAMOTIDINE 20 MG PO TABS
20.0000 mg | ORAL_TABLET | Freq: Two times a day (BID) | ORAL | Status: DC
Start: 2023-04-29 — End: 2023-04-29

## 2023-04-29 MED ORDER — ETOMIDATE 2 MG/ML IV SOLN
INTRAVENOUS | Status: AC | PRN
  Administered 2023-04-29: 15 mg via INTRAVENOUS

## 2023-04-29 MED ORDER — ORAL CARE MOUTH RINSE
15.0000 mL | OROMUCOSAL | Status: DC
  Administered 2023-04-29: 15 mL via OROMUCOSAL

## 2023-04-29 MED ORDER — IOHEXOL 350 MG/ML SOLN
75.0000 mL | Freq: Once | INTRAVENOUS | Status: AC | PRN
  Administered 2023-04-29: 75 mL via INTRAVENOUS

## 2023-04-29 MED ORDER — ORAL CARE MOUTH RINSE: 15.0000 mL | OROMUCOSAL | Status: DC | PRN

## 2023-04-29 MED ORDER — FENTANYL CITRATE PF 50 MCG/ML IJ SOSY: 25.0000 ug | PREFILLED_SYRINGE | INTRAMUSCULAR | Status: DC | PRN

## 2023-04-29 MED ORDER — MIDAZOLAM HCL 2 MG/2ML IJ SOLN: 1.0000 mg | INTRAMUSCULAR | Status: DC | PRN

## 2023-04-29 MED ORDER — CHLORHEXIDINE GLUCONATE CLOTH 2 % EX PADS
6.0000 | MEDICATED_PAD | Freq: Every day | CUTANEOUS | Status: DC
  Administered 2023-04-29: 6 via TOPICAL

## 2023-04-29 MED ORDER — SODIUM CHLORIDE 0.9 % IV SOLN
INTRAVENOUS | Status: AC | PRN
  Administered 2023-04-29: 1000 mL via INTRAVENOUS

## 2023-04-29 MED ORDER — MORPHINE 100MG IN NS 100ML (1MG/ML) PREMIX INFUSION
1.0000 mg/h | INTRAVENOUS | Status: DC
  Administered 2023-04-29: 1 mg/h via INTRAVENOUS
  Filled 2023-04-29: qty 100

## 2023-04-29 MED ORDER — EMPTY CONTAINERS FLEXIBLE MISC
900.0000 mg | Freq: Once | Status: DC
  Filled 2023-04-29: qty 90

## 2023-04-29 MED ORDER — PANTOPRAZOLE SODIUM 40 MG IV SOLR: 40.0000 mg | Freq: Every day | INTRAVENOUS | Status: DC

## 2023-04-30 ENCOUNTER — Encounter: Payer: Self-pay | Admitting: Internal Medicine

## 2023-05-05 NOTE — Progress Notes (Signed)
 Patient was transported to 4N19 via the ventilator with no complications.  Ventilator oxygen and air hoses connected to wall and ventilator was plugged into a red outlet.

## 2023-05-05 NOTE — ED Notes (Signed)
 Family at bedside & updated by EDP.

## 2023-05-05 NOTE — ED Provider Notes (Signed)
  EMERGENCY DEPARTMENT AT [facility] Provider Note   CSN: [id number] Arrival date & time: 04/21/2023  1134     History  Chief Complaint  Patient presents with   Alicia [name]    Alicia [name] is a 78 y.o. female.  Pt is a 79 y.o. female with a hx of paroxysmal Afib s/p ablation, PVCs, HTN (labile BP), and peripheral neuropathy who is currently on Xarelto and presents today as a level 1 trauma.  EMS reported that patient was per her husband awake and normal at 1 AM this morning when he went to bed.  He woke up this morning and found her face down in the bathroom barely breathing.  EMS reports upon their arrival patient was satting 55% and agonal he breathing.  She has had a GCS of 3 since their arrival and sats improved with bag-valve-mask.  She appeared to have trauma to her face and right side of her body.  The history is provided by the EMS personnel. The history is limited by the condition of the patient.  Fall       Home Medications Prior to Admission medications   Not on File      Allergies    Patient has no allergy information on record.    Review of Systems   Review of Systems  Physical Exam Updated Vital Signs BP (!) 103/44   Pulse (!) 128   Temp (!) 95.4 F (35.2 C)   Resp 20   Ht 5\' 4"  (1.626 m)   Wt 49.9 kg   SpO2 100%   BMI 18.88 kg/m  Physical Exam Vitals and nursing note reviewed.  Constitutional:      General: She is in acute distress.     Appearance: She is well-developed.     Interventions: Cervical collar and backboard in place.     Comments: Unresponsive  HENT:     Head: Normocephalic. Raccoon eyes present.  Eyes:     Comments: Pupils are 5 mm bilaterally and not reactive to light.  Significant ecchymosis noted around the right eye  Neck:     Comments: C-collar in place Cardiovascular:     Rate and Rhythm: Tachycardia present. Rhythm irregularly irregular.     Heart sounds: Normal heart sounds. No murmur heard.    No  friction rub.  Pulmonary:     Effort: Pulmonary effort is normal.     Breath sounds: Normal breath sounds. No wheezing or rales.     Comments: Bruising to the right breast Abdominal:     General: Bowel sounds are normal. There is no distension.     Palpations: Abdomen is soft.     Tenderness: There is no abdominal tenderness. There is no guarding or rebound.  Musculoskeletal:        General: Signs of injury present. No tenderness. Normal range of motion.     Comments: No edema.  Ecchymosis and swelling noted to the right wrist.  Also bruising noted to the right knee and left foot  Skin:    General: Skin is warm and dry.     Findings: No rash.  Neurological:     Mental Status: She is unresponsive.     GCS: GCS eye subscore is 1. GCS verbal subscore is 1. GCS motor subscore is 1.  Psychiatric:        Behavior: Behavior normal.     ED Results / Procedures / Treatments   Labs (all labs ordered are listed, but  only abnormal results are displayed) Labs Reviewed  CBC - Abnormal; Notable for the following components:      Result Value   WBC 14.7 (*)    All other components within normal limits  PROTIME-INR - Abnormal; Notable for the following components:   Prothrombin Time 16.0 (*)    INR 1.3 (*)    All other components within normal limits  I-STAT CHEM 8, ED - Abnormal; Notable for the following components:   BUN 25 (*)    Glucose, Bld 175 (*)    All other components within normal limits  I-STAT CG4 LACTIC ACID, ED - Abnormal; Notable for the following components:   Lactic Acid, Venous 2.4 (*)    All other components within normal limits  I-STAT VENOUS BLOOD GAS, ED - Abnormal; Notable for the following components:   pH, Ven 7.196 (*)    pCO2, Ven 80.2 (*)    pO2, Ven 200 (*)    Bicarbonate 31.1 (*)    TCO2 33 (*)    All other components within normal limits  COMPREHENSIVE METABOLIC PANEL  ETHANOL  URINALYSIS, ROUTINE W REFLEX MICROSCOPIC  BLOOD GAS, ARTERIAL  SAMPLE TO  BLOOD BANK    EKG None  Radiology CT CHEST ABDOMEN PELVIS W CONTRAST Result Date: 04/07/2023 CLINICAL DATA:  Blunt poly trauma. Level 1. Chest and abdominal pain. EXAM: CT CHEST, ABDOMEN, AND PELVIS WITH CONTRAST TECHNIQUE: Multidetector CT imaging of the chest, abdomen and pelvis was performed following the standard protocol during bolus administration of intravenous contrast. RADIATION DOSE REDUCTION: This exam was performed according to the departmental dose-optimization program which includes automated exposure control, adjustment of the mA and/or kV according to patient size and/or use of iterative reconstruction technique. CONTRAST:  75mL OMNIPAQUE IOHEXOL 350 MG/ML SOLN COMPARISON:  None Available. FINDINGS: CT CHEST FINDINGS Cardiovascular: No evidence of thoracic aortic injury or mediastinal hematoma. No pericardial effusion. Mediastinum/Nodes: No evidence of hemorrhage or pneumomediastinum. No masses or pathologically enlarged lymph nodes identified. Lungs/Pleura: Endotracheal tube is seen in place. No evidence of pneumothorax or pneumomediastinum. Tiny low-attenuation right pleural effusion noted. No evidence of pulmonary contusion or other acute infiltrate. Bilateral pleural-parenchymal scarring is seen, greatest in the lung apices. Musculoskeletal: No acute fractures or suspicious bone lesions identified. Several old left posterior rib fracture deformities are noted. CT ABDOMEN PELVIS FINDINGS Hepatobiliary: No hepatic laceration or mass identified. Mild hepatic steatosis noted, with focal fatty sparing seen multiple subcapsular locations in the right and left lobes. Gallbladder is unremarkable. No evidence of biliary ductal dilatation. Pancreas: No parenchymal laceration, mass, or inflammatory changes identified. Spleen: No evidence of splenic laceration. Adrenal/Urinary Tract: No hemorrhage or parenchymal lacerations identified. No evidence of suspicious masses or hydronephrosis. Unremarkable  unopacified urinary bladder. Stomach/Bowel: Enteric tube tip seen in stomach. Unopacified bowel loops are unremarkable in appearance. No evidence of hemoperitoneum. Vascular/Lymphatic: No evidence of abdominal aortic injury or retroperitoneal hemorrhage. No pathologically enlarged lymph nodes identified. Reproductive: Prior hysterectomy noted. Adnexal regions are unremarkable in appearance. Vaginal pessary noted. Other:  None. Musculoskeletal: No acute fractures or suspicious bone lesions identified. : Tiny right pleural effusion.  No other acute findings. Mild hepatic steatosis. These results were called by telephone at the time of interpretation on 04/15/2023 at 12:25 pm to provider Dr. Violeta Gelinas, who verbally acknowledged these results. Electronically Signed   By: Danae Orleans M.D.   On: 04/26/2023 12:25   CT HEAD WO CONTRAST Result Date: 04/08/2023 CLINICAL DATA:  Head trauma, moderate-severe; Polytrauma, blunt; Facial  trauma, blunt EXAM: CT HEAD WITHOUT CONTRAST CT MAXILLOFACIAL WITHOUT CONTRAST CT CERVICAL SPINE WITHOUT CONTRAST TECHNIQUE: Multidetector CT imaging of the head, cervical spine, and maxillofacial structures were performed using the standard protocol without intravenous contrast. Multiplanar CT image reconstructions of the cervical spine and maxillofacial structures were also generated. RADIATION DOSE REDUCTION: This exam was performed according to the departmental dose-optimization program which includes automated exposure control, adjustment of the mA and/or kV according to patient size and/or use of iterative reconstruction technique. 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 No CT evidence of an acute cortical infarct. No mass lesion. No extra-axial fluid collection Vascular: No hyperdense vessel or unexpected  calcification. Skull: Normal. Negative for fracture or focal lesion. Other: None. CT MAXILLOFACIAL FINDINGS Osseous: No fracture or mandibular dislocation. No destructive process. Orbits: Negative. No traumatic or inflammatory finding. Sinuses: Clear. Soft tissues: Soft tissue swelling in the periorbital soft tissues on the right. No extra-axial fluid collection. No CT evidence of an acute cortical infarct. No mass lesion. CT CERVICAL SPINE FINDINGS Alignment: There is increased retrolisthesis of C4 on C5 compared to a 10/06/22. While this may be positional, further evaluation with a cervical spine MRI is recommended to exclude the possibility of ligamentous injury. Skull base and vertebrae: No acute fracture. No primary bone lesion or focal pathologic process. Soft tissues and spinal canal: There is nonspecific soft tissue thickening around the right carotid bifurcation and proximal internal carotid artery. This may be posttraumatic in nature. Consider further evaluation with CTA head and neck angiogram further evaluation. There is mild prevertebral soft tissue edema. Disc levels:  No evidence of high-grade spinal canal stenosis Upper chest: Biapical pleural-parenchymal scarring. Other: None IMPRESSION: 1. 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 2. Increased retrolisthesis of C4 on C5 compared to a 10/06/22. While this may be positional, further evaluation with a cervical spine MRI is recommended to exclude the possibility of ligamentous injury. 3. Nonspecific soft tissue thickening around the right carotid bifurcation and proximal internal carotid artery. This may be posttraumatic in nature. Consider further evaluation with CTA neck to exclude the possibility of vascular injury. 4. Soft tissue swelling in the periorbital soft tissues on the right. No facial bone fracture. Discussed with Dr. Janee Morn on  04/23/2023 at 12:24 PM. Electronically Signed   By: Lorenza Cambridge M.D.   On: 04/09/2023 12:24   CT MAXILLOFACIAL WO CONTRAST Result Date: 04/09/2023 CLINICAL DATA:  Head trauma, moderate-severe; Polytrauma, blunt; Facial trauma, blunt EXAM: CT HEAD WITHOUT CONTRAST CT MAXILLOFACIAL WITHOUT CONTRAST CT CERVICAL SPINE WITHOUT CONTRAST TECHNIQUE: Multidetector CT imaging of the head, cervical spine, and maxillofacial structures were performed using the standard protocol without intravenous contrast. Multiplanar CT image reconstructions of the cervical spine and maxillofacial structures were also generated. RADIATION DOSE REDUCTION: This exam was performed according to the departmental dose-optimization program which includes automated exposure control, adjustment of the mA and/or kV according to patient size and/or use of iterative reconstruction technique. 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 No CT evidence of an acute cortical infarct. No mass lesion. No extra-axial  fluid collection Vascular: No hyperdense vessel or unexpected calcification. Skull: Normal. Negative for fracture or focal lesion. Other: None. CT MAXILLOFACIAL FINDINGS Osseous: No fracture or mandibular dislocation. No destructive process. Orbits: Negative. No traumatic or inflammatory finding. Sinuses: Clear. Soft tissues: Soft tissue swelling in the periorbital soft tissues on the right. No extra-axial fluid collection. No CT evidence of an acute cortical infarct. No mass lesion. CT CERVICAL SPINE FINDINGS Alignment: There is increased retrolisthesis of C4 on C5 compared to a 10/06/22. While this may be positional, further evaluation with a cervical spine MRI is recommended to exclude the possibility of ligamentous injury. Skull base and vertebrae: No acute fracture. No  primary bone lesion or focal pathologic process. Soft tissues and spinal canal: There is nonspecific soft tissue thickening around the right carotid bifurcation and proximal internal carotid artery. This may be posttraumatic in nature. Consider further evaluation with CTA head and neck angiogram further evaluation. There is mild prevertebral soft tissue edema. Disc levels:  No evidence of high-grade spinal canal stenosis Upper chest: Biapical pleural-parenchymal scarring. Other: None IMPRESSION: 1. 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 2. Increased retrolisthesis of C4 on C5 compared to a 10/06/22. While this may be positional, further evaluation with a cervical spine MRI is recommended to exclude the possibility of ligamentous injury. 3. Nonspecific soft tissue thickening around the right carotid bifurcation and proximal internal carotid artery. This may be posttraumatic in nature. Consider further evaluation with CTA neck to exclude the possibility of vascular injury. 4. Soft tissue swelling in the periorbital soft tissues on the right. No facial bone fracture. Discussed with Dr. Janee Morn on 05/04/2023 at 12:24 PM. Electronically Signed   By: Lorenza Cambridge M.D.   On: 05/02/2023 12:24   CT CERVICAL SPINE WO CONTRAST Result Date: 05/01/2023 CLINICAL DATA:  Head trauma, moderate-severe; Polytrauma, blunt; Facial trauma, blunt EXAM: CT HEAD WITHOUT CONTRAST CT MAXILLOFACIAL WITHOUT CONTRAST CT CERVICAL SPINE WITHOUT CONTRAST TECHNIQUE: Multidetector CT imaging of the head, cervical spine, and maxillofacial structures were performed using the standard protocol without intravenous contrast. Multiplanar CT image reconstructions of the cervical spine and maxillofacial structures were also generated. RADIATION DOSE REDUCTION: This exam was performed according to the departmental dose-optimization program which  includes automated exposure control, adjustment of the mA and/or kV according to patient size and/or use of iterative reconstruction technique. 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 No CT evidence of an acute cortical infarct. No mass lesion. No extra-axial fluid collection Vascular: No hyperdense vessel or unexpected calcification. Skull: Normal. Negative for fracture or focal lesion. Other: None. CT MAXILLOFACIAL FINDINGS Osseous: No fracture or mandibular dislocation. No destructive process. Orbits: Negative. No traumatic or inflammatory finding. Sinuses: Clear. Soft tissues: Soft tissue swelling in the periorbital soft tissues on the right. No extra-axial fluid collection. No CT evidence of an acute cortical infarct. No mass lesion. CT CERVICAL SPINE FINDINGS Alignment: There is increased retrolisthesis of C4 on C5 compared to a 10/06/22. While this may be positional, further evaluation with a cervical spine MRI is recommended to exclude the possibility of ligamentous injury. Skull base and vertebrae: No acute fracture. No primary bone lesion or focal pathologic process. Soft tissues and spinal canal: There is nonspecific soft tissue thickening  around the right carotid bifurcation and proximal internal carotid artery. This may be posttraumatic in nature. Consider further evaluation with CTA head and neck angiogram further evaluation. There is mild prevertebral soft tissue edema. Disc levels:  No evidence of high-grade spinal canal stenosis Upper chest: Biapical pleural-parenchymal scarring. Other: None IMPRESSION: 1. 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 2. Increased retrolisthesis of C4 on C5 compared to a 10/06/22. While  this may be positional, further evaluation with a cervical spine MRI is recommended to exclude the possibility of ligamentous injury. 3. Nonspecific soft tissue thickening around the right carotid bifurcation and proximal internal carotid artery. This may be posttraumatic in nature. Consider further evaluation with CTA neck to exclude the possibility of vascular injury. 4. Soft tissue swelling in the periorbital soft tissues on the right. No facial bone fracture. Discussed with Dr. Janee Morn on 04/24/2023 at 12:24 PM. Electronically Signed   By: Lorenza Cambridge M.D.   On: 04/10/2023 12:24    Procedures Date/Time: 05/01/2023 12:04 PM  Performed by: Gwyneth Sprout, MDOxygen Delivery Method: Ambu bag Preoxygenation: Pre-oxygenation with 100% oxygen Induction Type: Rapid sequence Ventilation: Mask ventilation without difficulty Laryngoscope Size: 3 Tube size: 7.5 mm Number of attempts: 1 Airway Equipment and Method: Video-laryngoscopy Placement Confirmation: ETT inserted through vocal cords under direct vision and Positive ETCO2 Secured at: 21 cm Tube secured with: ETT holder Dental Injury: Teeth and Oropharynx as per pre-operative assessment  Difficulty Due To: Difficulty was unanticipated        Medications Ordered in ED Medications  etomidate (AMIDATE) injection (15 mg Intravenous Given 05/04/2023 1139)  fentaNYL (SUBLIMAZE) injection 25 mcg (has no administration in time range)  fentaNYL (SUBLIMAZE) injection 25-100 mcg (has no administration in time range)  midazolam (VERSED) injection 1-2 mg (has no administration in time range)  iohexol (OMNIPAQUE) 350 MG/ML injection 75 mL (75 mLs Intravenous Contrast Given 04/13/2023 1212)    ED Course/ Medical Decision Making/ A&P                                 Medical Decision Making Amount and/or Complexity of Data Reviewed Independent Historian: EMS External Data Reviewed: notes. Labs: ordered. Decision-making details documented in ED  Course. Radiology: ordered and independent interpretation performed. Decision-making details documented in ED Course. ECG/medicine tests: ordered and independent interpretation performed. Decision-making details documented in ED Course.  Risk Prescription drug management. Decision regarding hospitalization.   Pt with multiple medical problems and comorbidities and presenting today with a complaint that caries a high risk for morbidity and mortality.  Here today as a level 1 trauma after being found down unresponsive on her bathroom floor.  Patient does take Xarelto and upon arrival has a GCS of 3.  Patient is not localizing to pain has no gag reflex but does have agonal breaths.  Concern for intracranial bleed from trauma and facial fractures versus ischemia and stroke versus anoxic brain injury.  Only displays mild trauma below the head possible right wrist fracture.  Patient was intubated for airway protection with improvement of heart rate and blood pressure.  Trauma scans are pending.  12:36 PM I have independently visualized and interpreted pt's images today.  Head CT showed a large bleed down in the pontine area near the brainstem.  Concern for possible herniation given patient's exam findings.  She is  on Xarelto and was reversed with Andexxa low dose.  I independently interpreted patient's labs and EKG.  EKG shows atrial fibrillation, VBG with respiratory acidosis and hypercarbia, lactate is elevated at 2.4, CBC with leukocytosis of 14 and Chem-8 without acute findings.  Consulted with neurology  12:36 PM Spoke with Dr. Wilford Corner who confirmed that this is a nonsurvivable brain injury.  Patient's husband has arrived and discussed the findings with him however his children are coming from [place].  Patient will need withdrawal of care but will wait for family to arrive.  Will discuss with critical care.  CRITICAL CARE Performed by: Christinea Brizuela Total critical care time: 30  minutes Critical care time was exclusive of separately billable procedures and treating other patients. Critical care was necessary to treat or prevent imminent or life-threatening deterioration. Critical care was time spent personally by me on the following activities: development of treatment plan with patient and/or surrogate as well as nursing, discussions with consultants, evaluation of patient's response to treatment, examination of patient, obtaining history from patient or surrogate, ordering and performing treatments and interventions, ordering and review of laboratory studies, ordering and review of radiographic studies, pulse oximetry and re-evaluation of patient's condition.         Final Clinical Impression(s) / ED Diagnoses Final diagnoses:  Fall, initial encounter  Intracranial hemorrhage (HCC)  Hypercarbia    Rx / DC Orders ED Discharge Orders     None         Gwyneth Sprout, MD 04/30/2023 1237

## 2023-05-05 NOTE — Progress Notes (Signed)
 Transition of Care [facility]) - CAGE-AID Screening   Patient Details  Name: Alicia [name] MRN: [id number] Date of Birth: [birth year]  Transition of [facility]) CM/SW Contact:    Katha Hamming, RN Phone Number: 04/07/2023, 1900   CAGE-AID Screening: Substance Abuse Screening unable to be completed due to: : Patient unable to participate (intubated, plan for terminal extubation)

## 2023-05-05 NOTE — ED Notes (Signed)
 X-ray at bedside

## 2023-05-05 NOTE — ED Notes (Signed)
 Transported to CT

## 2023-05-05 NOTE — Progress Notes (Signed)
   05/02/2023 05-09-2057  Post Mortem Checklist  Date of Death 05/02/2023  Time of Death 05-09-2057  Pronounced By Winona Legato, RN and Claris Pong, RN  Next of kin notified Yes  Name of next of kin notified of death Ahonesty Woodfin  Contact Person's Relationship to Patient Spouse

## 2023-05-05 NOTE — Consult Note (Addendum)
 Reason for Consult:GLF, unconscious Referring Physician: Gwyneth Sprout  Alicia [name] is an 78 y.o. female.  HPI: 78yo F with PMHx AF on Xeralto was last seen normal at 0100. She was found down unconscious in the bathroom this AM. Vomitus present. She was brought in as a level one. On arrival, GCS 3 and she was intubated by the EDP. Initial BP 170/100.  History reviewed. No pertinent past medical history.  History reviewed. No pertinent surgical history.  History reviewed. No pertinent family history.  Social History:  reports that she has never smoked. She has never used smokeless tobacco. No history on file for alcohol use and drug use.  Allergies: Not on File  Medications: I have reviewed the patient's current medications.  Results for orders placed or performed during the hospital encounter of 05/03/2023 (from the past 48 hours)  I-Stat Chem 8, ED     Status: Abnormal   Collection Time: 04/25/2023 11:53 AM  Result Value Ref Range   Sodium 136 135 - 145 mmol/L   Potassium 4.4 3.5 - 5.1 mmol/L   Chloride 99 98 - 111 mmol/L   BUN 25 (H) 8 - 23 mg/dL   Creatinine, Ser 0.86 0.44 - 1.00 mg/dL   Glucose, Bld 578 (H) 70 - 99 mg/dL    Comment: Glucose reference range applies only to samples taken after fasting for at least 8 hours.   Calcium, Ion 1.17 1.15 - 1.40 mmol/L   TCO2 31 22 - 32 mmol/L   Hemoglobin 14.6 12.0 - 15.0 g/dL   HCT 46.9 62.9 - 52.8 %  I-Stat Lactic Acid, ED     Status: Abnormal   Collection Time: 04/28/2023 11:53 AM  Result Value Ref Range   Lactic Acid, Venous 2.4 (HH) 0.5 - 1.9 mmol/L   Comment NOTIFIED PHYSICIAN     No results found.  Review of Systems  Unable to perform ROS: Intubated   Blood pressure (!) 151/81, pulse (!) 26, temperature (!) 96.9 F (36.1 C), temperature source Temporal, resp. rate (!) 28, SpO2 100%. Physical Exam HENT:     Head:     Comments: Significant right periorbital ecchymosis    Mouth/Throat:     Mouth: Mucous membranes  are dry.     Comments: Significant dried bilious emesis around her face and in her mouth Eyes:     Comments: Left pupil fixed and dilated, right eye swollen shut as above  Neck:     Comments: Cervical collar Cardiovascular:     Rate and Rhythm: Normal rate. Rhythm irregular.     Pulses: Normal pulses.  Pulmonary:     Comments: Few rhonchi, assisted ventilations History of augmentation mammoplasty, small contusion right breast Abdominal:     General: Abdomen is flat. There is no distension.     Palpations: Abdomen is soft.     Tenderness: There is no abdominal tenderness. There is no guarding or rebound.  Musculoskeletal:     Comments: Contusion left knee, R  wrist deformity  Skin:    General: Skin is dry.     Capillary Refill: Capillary refill takes 2 to 3 seconds.  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  This is a devastating hemorrhage.  I fully agree with consideration of holding off on anticoagulation reversal and proceeding with goals of care discussions including compassionate extubation.  Her husband  and neighbor are at the bedside. Right periorbital ecchymosis -no facial fractures C4-5 retrolisthesis -cervical collar, no further workup recommended in light of above R wrist deformity/likely FX -defer further workup pending above Edema around right carotid artery -likely dependent edema, no further workup in light of above Hypoxic ventilator dependent respiratory failure I discussed with Dr. Anitra Lauth and Dr. Wilford Corner at the bedside Critical care 52 minutes  Liz Malady 04/27/2023, 12:10 PM

## 2023-05-05 NOTE — H&P (Signed)
 NAME:  Alicia [name], MRN:  [id number], DOB:  26-Sep-1945, LOS: 0 ADMISSION DATE:  04/09/2023, CONSULTATION DATE: 04/09/2023 REFERRING MD:  Gwyneth Sprout, MD , CHIEF COMPLAINT: Unresponsive  History of Present Illness:  78 year old female with paroxysmal A-fib status post ablation on Xarelto, hypertension and peripheral neuropathy who was brought in the emergency department accompanied by her husband after she was found unresponsive with face down in bathroom.  EMS reports showed patient O2 sat was 55%, she was agonal he breathing, with a GCS of 3, BVM was started with improvement in oxygen saturation and patient was transferred to emergency department.  Patient was intubated in the ED.  Stroke code was called, patient underwent CT head, CT angiogram of head and neck which showed large pontine L intraparenchymal hemorrhage with IVH, surrounding cerebral edema and brain compression.    Patient's husband was informed about unsurvivable brain bleed, he stated that he would like to start comfort care but he would like to wait for his family to arrive.  PCCM was consulted for help evaluation and ICU admission  Pertinent  Medical History  Paroxysmal A-fib status post ablation Hypertension Peripheral neuropathy  Significant Hospital Events: Including procedures, antibiotic start and stop dates in addition to other pertinent events     Interim History / Subjective:  As above  Objective   Blood pressure (!) 90/48, pulse (!) 128, temperature (!) 95.4 F (35.2 C), resp. rate (!) 24, height 5\' 4"  (1.626 m), weight 49.9 kg, SpO2 100%.    Vent Mode: PRVC FiO2 (%):  [60 %-100 %] 60 % Set Rate:  [20 bmp-24 bmp] 24 bmp Vt Set:  [440 mL] 440 mL PEEP:  [5 cmH20] 5 cmH20  No intake or output data in the 24 hours ending 05/04/2023 1317 Filed Weights   04/19/2023 1235  Weight: 49.9 kg    Examination: General: Crtitically ill-appearing elderly female, orally intubated HEENT: [place]/AT, eyes anicteric.   ETT and OGT in place.  Ecchymosis noted on right side of her face and around right eye Neuro: Eyes closed, does not open, not following commands.  Pupils bilateral dilated, nonreactive to light Chest: Coarse breath sounds, no wheezes or rhonchi Heart: Regular rate and rhythm, no murmurs or gallops Abdomen: Soft, nondistended, bowel sounds present Skin: No rash  Labs and images reviewed  Resolved Hospital Problem list     Assessment & Plan:  Acute pontine intraparenchymal hemorrhage with IVH, ICH score of 4, in the setting of anticoagulation with Xarelto Cerebral edema with brain compression Acute encephalopathy due to ICH Acute respiratory failure with hypoxia and hypercapnia Lactic acidosis  Unfortunately patient had suffered a nonsurvivable intraparenchymal hemorrhage of brainstem Continue supportive care Patient's family is arriving, then they would like to proceed with comfort care and palliative extubation I do not think reversing Xarelto at this point we will give any benefit Admit to ICU  Best Practice (right click and "Reselect all SmartList Selections" daily)   Diet/type: NPO DVT prophylaxis: SCD GI prophylaxis: NA Lines: NA Foley:  NA Code Status:  DNR Last date of multidisciplinary goals of care discussion: 2/23: Patient's husband was updated at bedside   Labs   CBC: Recent Labs  Lab 04/15/2023 1148 05/02/2023 1153 04/18/2023 1204  WBC 14.7*  --   --   HGB 13.4 14.6 14.6  HCT 42.0 43.0 43.0  MCV 98.1  --   --   PLT 292  --   --     Basic Metabolic Panel: Recent Labs  Lab 04/22/2023 1148 04/08/2023 1153 04/12/2023 1204  NA 135 136 136  K 4.3 4.4 4.5  CL 100 99  --   CO2 25  --   --   GLUCOSE 176* 175*  --   BUN 18 25*  --   CREATININE 0.90 0.80  --   CALCIUM 8.8*  --   --    GFR: Estimated Creatinine Clearance: 45.7 mL/min (by C-G formula based on SCr of 0.8 mg/dL). Recent Labs  Lab 04/28/2023 1148 04/27/2023 1153  WBC 14.7*  --   LATICACIDVEN  --   2.4*    Liver Function Tests: Recent Labs  Lab 05/02/2023 1148  AST 41  ALT 17  ALKPHOS 52  BILITOT 0.4  PROT 7.0  ALBUMIN 3.8   No results for input(s): "LIPASE", "AMYLASE" in the last 168 hours. No results for input(s): "AMMONIA" in the last 168 hours.  ABG    Component Value Date/Time   HCO3 31.1 (H) 04/21/2023 1204   TCO2 33 (H) 04/28/2023 1204   O2SAT 99 04/14/2023 1204     Coagulation Profile: Recent Labs  Lab 04/15/2023 1148  INR 1.3*    Cardiac Enzymes: No results for input(s): "CKTOTAL", "CKMB", "CKMBINDEX", "TROPONINI" in the last 168 hours.  HbA1C: No results found for: "HGBA1C"  CBG: No results for input(s): "GLUCAP" in the last 168 hours.  Review of Systems:   Unable to obtain as patient is intubated and comatose  Past Medical History:  She,  has no past medical history on file.   Surgical History:  History reviewed. No pertinent surgical history.   Social History:   reports that she has never smoked. She has never used smokeless tobacco.   Family History:  Her family history is not on file.   Allergies Not on File   Home Medications  Prior to Admission medications   Not on File     Critical care time:     The patient is critically ill due to acute brainstem hemorrhage.  Critical care was necessary to treat or prevent imminent or life-threatening deterioration.  Critical care was time spent personally by me on the following activities: development of treatment plan with patient and/or surrogate as well as nursing, discussions with consultants, evaluation of patient's response to treatment, examination of patient, obtaining history from patient or surrogate, ordering and performing treatments and interventions, ordering and review of laboratory studies, ordering and review of radiographic studies, pulse oximetry, re-evaluation of patient's condition and participation in multidisciplinary rounds.   During this encounter critical care time  was devoted to patient care services described in this note for 33 minutes.     Cheri Fowler, MD [facility] See Amion for pager If no response to pager, please call [phone] until 7pm After 7pm, Please call E-link [phone]

## 2023-05-05 NOTE — Progress Notes (Signed)
 RT note. Patient transported to CT and back on ventilator without any complications. RT will continue to monitor.    04/24/2023 1140  Vent Select  $ Ventilator Initial/Subsequent  Initial  $ Intubation  Yes  $ Transport Ventilator  Yes  Invasive or Noninvasive Invasive  Adult Vent Y  Vent start date 05/01/2023  Vent start time 1140  Airway 7.5 mm  Placement Date/Time: 04/22/2023 1130   Airway Device: Endotracheal Tube  Laryngoscope Blade: 3  ETT Types: Endobronchial  Size (mm): 7.5 mm  Cuffed: Cuffed  Insertion attempts: 2  Airway Equipment: Stylet;Video Laryngoscope  Placement Confirmation: Dire...  Secured at (cm) 21 cm  Measured From Lips  Secured Location Right  Secured By English as a second language teacher No  Site Condition Dry;Other (Comment) (Dry blood. vomit)  Adult IBW/VT Calculations  Height 5\' 4"  (1.626 m)  IBW/kg (Calculated) Female 59.2 kg  Low Range Vt 6cc/kg FEMALE 355.2 mL  Adult Moderate Range Vt 8cc/kg MA 473.6 mL  Adult High Range Vt 10cc/kg FEMALE 592 mL  IBW/kg (Calculated) FEMALE 54.7 kg  Low Range Vt 6cc/kg FEMALE 328.2 mL  Adult Moderate Range vt 8cc/kg FEMALE 437.6 mL  Adult High Range Vt 10cc/kg FEMALE 547 mL  Adult Ventilator Settings  Vent Type Servo i  Humidity HME  Vent Mode PRVC  Vt Set 440 mL  Set Rate 20 bmp  FiO2 (%) 100 %  I Time 1 Sec(s)  PEEP 5 cmH20  Adult Ventilator Measurements  Peak Airway Pressure 20 L/min  Mean Airway Pressure 10 cmH20  Resp Rate Spontaneous 0 br/min  Resp Rate Total 20 br/min  Exhaled Vt 449 mL  Measured Ve 8.8 L  I:E Ratio Measured 1:2.0  Auto PEEP 0 cmH20  Total PEEP 5 cmH20  SpO2 100 %  Adult Ventilator Alarms  Alarms On Y  Ve High Alarm 20 L/min  Ve Low Alarm 5 L/min  Resp Rate High Alarm 40 br/min  Resp Rate Low Alarm 10  PEEP Low Alarm 3 cmH2O  Press High Alarm 40 cmH2O  T Apnea 20 sec(s)  VAP Prevention  Equipment wiped down Yes  Breath Sounds  Bilateral Breath Sounds Diminished;Rhonchi  Vent  Respiratory Assessment  Level of Consciousness Unresponsive  Suction Method  Respiratory Interventions Airway suction  Airway Suctioning/Secretions  Suction Type ETT  Suction Device  Catheter  Secretion Amount Small  Secretion Color  (hemo/black/brown dry blood/ vomit)  Secretion Consistency Thick  Suction Tolerance Tolerated well  Suctioning Adverse Effects None

## 2023-05-05 NOTE — Progress Notes (Signed)
 eLink Physician-Brief Progress Note Patient Name: Alicia [name] DOB: Sep 08, 1945 MRN: [id number]   Date of Service  04/13/2023  HPI/Events of Note  Family arrived, RN has comfort care orders already. On morphine drip. Asking for terminal, comfort care extubation order.  Chart reviewed.   eICU Interventions  Extubation ordered.      Intervention Category Intermediate Interventions: Other:  Ranee Gosselin 04/15/2023, 7:59 PM

## 2023-05-05 NOTE — ED Triage Notes (Signed)
 Pt BIB EMS for fall. Pt and husband went to sleep at 0100, pts husband woke up and found pt in bathroom agonal, blood on face . Room air saturation was 55%. Unknown if pt is on blood thinners. GCS 3

## 2023-05-05 NOTE — ED Notes (Signed)
 Admitting at bedside

## 2023-05-05 NOTE — ED Notes (Signed)
 Neurology at bedside.

## 2023-05-05 NOTE — Consult Note (Addendum)
 NEUROLOGY CONSULT NOTE   Date of service: 05-01-2023 Patient Name: Alicia [name] MRN:  [id number] DOB:  11-19-1945 Chief Complaint: "found down Angelica Ran" Requesting Provider: Gwyneth Sprout, MD  History of Present Illness  [name] is a 78 y.o. female with  past medical history of hyperlipidemia, peripheral neuropathy, fibromyalgia, muscular dystrophy, paroxysmal atrial fibrillation on DOAC, chronic migraines, presented via EMS as a trauma alert on thinners when she was found down on the floor.  Husband last went to bed at 1 AM and she was fine then.  Unclear last known well. Upon arrival, she was GCS 3 and required emergent intubation. She was evaluated by trauma surgery due to being brought in as a trauma alert, taken to the scanner to get CT head, maxillofacial and C-spine-which revealed a large pontine hemorrhage-2.6 x 2.8 x 4.1 cm with intraventricular extension and mass effect on the fourth ventricle.  Increased retrolisthesis of the C4-5 compared to 10/06/2022.  Nonspecific soft tissue thickening around the right carotid bifurcation and proximal internal carotid artery.  This was likely posttraumatic.  Soft tissue swelling in the periorbital soft tissue on the right.  No facial bone fracture seen.  Neurology was consulted for the large pontine bleed.  LKW: Somewhere around 1 AM Modified rankin score: 0-Completely asymptomatic and back to baseline post- stroke IV Thrombolysis: No-ICH EVT: Same as above ICH Score: 4 NIHSS 36   ROS  Unable to perform due to intubated status  Past History  History reviewed. No pertinent past medical history.  History reviewed. No pertinent surgical history.  Family History: History reviewed. No pertinent family history.  Social History  reports that she has never smoked. She has never used smokeless tobacco. No history on file for alcohol use and drug use.  Not on File  Medications   Current Facility-Administered  Medications:    etomidate (AMIDATE) injection, , Intravenous, Code/Trauma/Sedation Med, Gwyneth Sprout, MD, 15 mg at May 01, 2023 1139   fentaNYL (SUBLIMAZE) injection 25 mcg, 25 mcg, Intravenous, Q15 min PRN, Plunkett, Whitney, MD   fentaNYL (SUBLIMAZE) injection 25-100 mcg, 25-100 mcg, Intravenous, Q30 min PRN, Anitra Lauth, Whitney, MD   midazolam (VERSED) injection 1-2 mg, 1-2 mg, Intravenous, Q1H PRN, Gwyneth Sprout, MD No current outpatient medications on file.  Vitals   Vitals:   2023-05-01 1205 2023/05/01 1215 05/01/2023 1230 05-01-2023 1235  BP: 112/63 (!) 99/57 (!) 103/44   Pulse:   (!) 128   Resp: 20 20 20    Temp:  (!) 93.2 F (34 C) (!) 95.4 F (35.2 C)   TempSrc:      SpO2:   100%   Weight:    49.9 kg  Height:    5\' 4"  (1.626 m)    Body mass index is 18.88 kg/m.  Physical Exam  General: Intubated, no sedation HEENT: Swollen right eyelid and right facial trauma CVS: Regular rate rhythm Respiratory: Vented breathing with the ventilator Neurological exam Intubated On no sedation No spontaneous movement Breathing with the ventilator Cranial nerves: Pupils are 5 mm fixed dilated, absent pupillary reflexes, absent cough and gag.,  Absent oculocephalics. Motor exam: No spontaneous movement.  No movement to noxious simulation Sensory: As above  Labs/Imaging/Neurodiagnostic studies   CBC:  Recent Labs  Lab May 01, 2023 1148 2023-05-01 1153 05-01-23 1204  WBC 14.7*  --   --   HGB 13.4 14.6 14.6  HCT 42.0 43.0 43.0  MCV 98.1  --   --   PLT 292  --   --  Basic Metabolic Panel:  Lab Results  Component Value Date   NA 136 04/17/2023   K 4.5 04/18/2023   GLUCOSE 175 (H) 04/25/2023   BUN 25 (H) 04/14/2023   CREATININE 0.80 04/16/2023   Lipid Panel: No results found for: "LDLCALC" HgbA1c: No results found for: "HGBA1C" Urine Drug Screen: No results found for: "LABOPIA", "COCAINSCRNUR", "LABBENZ", "AMPHETMU", "THCU", "LABBARB"  Alcohol Level No results found for:  "ETH" INR  Lab Results  Component Value Date   INR 1.3 (H) 04/07/2023    Personally reviewed imaging: CT head, maxillofacial and C-spine-which revealed a large pontine hemorrhage-2.6 x 2.8 x 4.1 cm with intraventricular extension and mass effect on the fourth ventricle.  Increased retrolisthesis of the C4-5 compared to 10/06/2022.  Nonspecific soft tissue thickening around the right carotid bifurcation and proximal internal carotid artery.  This was likely posttraumatic.  Soft tissue swelling in the periorbital soft tissue on the right.  No facial bone fracture seen.   ASSESSMENT   Alicia [name] is a 78 y.o. female who is on DOAC's for paroxysmal atrial fibrillation amongst other comorbidities, brought in after being found down on the floor by husband.  Examination as described by the ED provider was with minimal breathing over the ventilator but she was not protecting her airway-GCS was 3 on arrival She had fixed dilated pupils on arrival. On my examination, she is not breathing over the vent, intubated, no sedation with fixed and dilated pupils.  I think the massive brainstem hemorrhage has progressed her to be brain-dead at this time and reversal of DOAC or any other intervention will just be a futile exercise. I have discussed this in detail with the husband.  Children live 2 hours away and are on their way and they are arrival is being awaited to make final decisions.   Impression: Large nonsurvivable brainstem (pons) hemorrhage and IVH, likely in the setting of coagulopathy from DOAC, with clinical exam showing absent brainstem reflexes  RECOMMENDATIONS  I would recommend waiting for the family and one-way extubation at this time. No intervention is recommended including reversal of DOAC. I extended my condolences to the husband.  Discussed in detail with Dr. Anitra Lauth and the patient's husband at bedside.  I will be available for questions as  needed ______________________________________________________________________    Signed, Milon Dikes, MD [facility]

## 2023-05-05 NOTE — ED Notes (Signed)
 Pt transported to CT ?

## 2023-05-05 NOTE — Progress Notes (Signed)
 Mechanical ventilation discontinued due to comfort care / extubation orders.

## 2023-05-05 NOTE — Progress Notes (Signed)
 Orthopedic Tech Progress Note Patient Details:  Alicia [name] [birth year] [id number]  Patient ID: Alicia [name], female   DOB: 11-05-1945, 78 y.o.   MRN: [id number] Level I; not currently needed. Darleen Crocker 04/23/2023, 11:56 AM

## 2023-05-05 NOTE — Death Summary Note (Signed)
 DEATH SUMMARY   Patient Details  Name: Alicia [name] MRN: [id number] DOB: [birth year]  Admission/Discharge Information   Admit Date:  May 06, 2023  Date of Death: Date of Death: 05-06-2023  Time of Death: Time of Death: May 13, 2057  Length of Stay: 1  Referring Physician: [facility]   Reason(s) for Hospitalization  Acute pontine intraparenchymal hemorrhage with IVH, ICH score of 4, in the setting of anticoagulation with Xarelto Cerebral edema with brain compression Acute encephalopathy due to ICH Acute respiratory failure with hypoxia and hypercapnia Lactic acidosis  Diagnoses  Preliminary cause of death: Withdrawal of care in the setting of survivable acute pontine intraparenchymal hemorrhage with IVH  Secondary Diagnoses (including complications and co-morbidities):  Principal Problem:   ICH (intracerebral hemorrhage) [facility])   Brief Hospital Course (including significant findings, care, treatment, and services provided and events leading to death)  Alicia [name] is a 78 y.o. year old female  with paroxysmal A-fib status post ablation on Xarelto, hypertension and peripheral neuropathy who was brought in the emergency department accompanied by her husband after she was found unresponsive with face down in bathroom.  EMS reports showed patient O2 sat was 55%, she was agonal he breathing, with a GCS of 3, BVM was started with improvement in oxygen saturation and patient was transferred to emergency department.  Patient was intubated in the ED.  Stroke code was called, patient underwent CT head, CT angiogram of head and neck which showed large pontine L intraparenchymal hemorrhage with IVH, surrounding cerebral edema and brain compression.     Patient's husband was informed about unsurvivable brain bleed, he stated that he would like to start comfort care but he would like to wait for his family to arrive.  PCCM was consulted for help evaluation and ICU admission   Patient  was admitted to ICU, once patient's family arrived at bedside, they decided to proceed with comfort care and palliative extubation.  Patient was palliatively extubated and she passed on 2023-05-06 at 8:59 PM.  Patient's family was at bedside   Pertinent Labs and Studies  Significant Diagnostic Studies DG Pelvis Portable Result Date: 06-May-2023 CLINICAL DATA:  Trauma, fall EXAM: PORTABLE PELVIS 1-2 VIEWS COMPARISON:  None Available. FINDINGS: There is no evidence of pelvic fracture or diastasis. No pelvic bone lesions are seen. Faintly radio pack ring overlies the pelvis consistent with a pessary in place. IMPRESSION: Negative. Electronically Signed   By: Malachy Moan M.D.   On: 2023/05/06 12:52   DG Chest Port 1 View Result Date: 05-06-23 CLINICAL DATA:  Trauma EXAM: PORTABLE CHEST 1 VIEW COMPARISON:  04/01/2022 FINDINGS: Endotracheal tube 4.8 cm from carina. NG tube in stomach. Chronic interstitial thickening at the lung apices. No pneumothorax. No fracture evident on limited view of the chest. LEFT chest wall excluded. IMPRESSION: Endotracheal tube and NG tube in good position. No evidence of chest trauma on partial view of the chest. Electronically Signed   By: Genevive Bi M.D.   On: 2023/05/06 12:51   CT CHEST ABDOMEN PELVIS W CONTRAST Result Date: 05/06/23 CLINICAL DATA:  Blunt poly trauma. Level 1. Chest and abdominal pain. EXAM: CT CHEST, ABDOMEN, AND PELVIS WITH CONTRAST TECHNIQUE: Multidetector CT imaging of the chest, abdomen and pelvis was performed following the standard protocol during bolus administration of intravenous contrast. RADIATION DOSE REDUCTION: This exam was performed according to the departmental dose-optimization program which includes automated exposure control, adjustment of the mA and/or kV according to patient size and/or use of iterative  reconstruction technique. CONTRAST:  75mL OMNIPAQUE IOHEXOL 350 MG/ML SOLN COMPARISON:  None Available. FINDINGS: CT CHEST  FINDINGS Cardiovascular: No evidence of thoracic aortic injury or mediastinal hematoma. No pericardial effusion. Mediastinum/Nodes: No evidence of hemorrhage or pneumomediastinum. No masses or pathologically enlarged lymph nodes identified. Lungs/Pleura: Endotracheal tube is seen in place. No evidence of pneumothorax or pneumomediastinum. Tiny low-attenuation right pleural effusion noted. No evidence of pulmonary contusion or other acute infiltrate. Bilateral pleural-parenchymal scarring is seen, greatest in the lung apices. Musculoskeletal: No acute fractures or suspicious bone lesions identified. Several old left posterior rib fracture deformities are noted. CT ABDOMEN PELVIS FINDINGS Hepatobiliary: No hepatic laceration or mass identified. Mild hepatic steatosis noted, with focal fatty sparing seen multiple subcapsular locations in the right and left lobes. Gallbladder is unremarkable. No evidence of biliary ductal dilatation. Pancreas: No parenchymal laceration, mass, or inflammatory changes identified. Spleen: No evidence of splenic laceration. Adrenal/Urinary Tract: No hemorrhage or parenchymal lacerations identified. No evidence of suspicious masses or hydronephrosis. Unremarkable unopacified urinary bladder. Stomach/Bowel: Enteric tube tip seen in stomach. Unopacified bowel loops are unremarkable in appearance. No evidence of hemoperitoneum. Vascular/Lymphatic: No evidence of abdominal aortic injury or retroperitoneal hemorrhage. No pathologically enlarged lymph nodes identified. Reproductive: Prior hysterectomy noted. Adnexal regions are unremarkable in appearance. Vaginal pessary noted. Other:  None. Musculoskeletal: No acute fractures or suspicious bone lesions identified. : Tiny right pleural effusion.  No other acute findings. Mild hepatic steatosis. These results were called by telephone at the time of interpretation on 04/09/2023 at 12:25 pm to provider Dr. Violeta Gelinas, who verbally acknowledged  these results. Electronically Signed   By: Danae Orleans M.D.   On: 04/16/2023 12:25   CT HEAD WO CONTRAST Result Date: 04/07/2023 CLINICAL DATA:  Head trauma, moderate-severe; Polytrauma, blunt; Facial trauma, blunt EXAM: CT HEAD WITHOUT CONTRAST CT MAXILLOFACIAL WITHOUT CONTRAST CT CERVICAL SPINE WITHOUT CONTRAST TECHNIQUE: Multidetector CT imaging of the head, cervical spine, and maxillofacial structures were performed using the standard protocol without intravenous contrast. Multiplanar CT image reconstructions of the cervical spine and maxillofacial structures were also generated. RADIATION DOSE REDUCTION: This exam was performed according to the departmental dose-optimization program which includes automated exposure control, adjustment of the mA and/or kV according to patient size and/or use of iterative reconstruction technique. 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 No CT evidence of an acute cortical infarct. No mass lesion. No extra-axial fluid collection Vascular: No hyperdense vessel or unexpected calcification. Skull: Normal. Negative for fracture or focal lesion. Other: None. CT MAXILLOFACIAL FINDINGS Osseous: No fracture or mandibular dislocation. No destructive process. Orbits: Negative. No traumatic or inflammatory finding. Sinuses: Clear. Soft tissues: Soft tissue swelling in the periorbital soft tissues on the right. No extra-axial fluid collection. No CT evidence of an acute cortical infarct. No mass lesion. CT CERVICAL SPINE FINDINGS Alignment: There is increased retrolisthesis of C4 on C5 compared to a 10/06/22. While this may be positional, further evaluation with a cervical spine MRI is recommended to exclude the possibility of ligamentous injury. Skull base and vertebrae: No acute fracture. No primary bone lesion  or focal pathologic process. Soft tissues and spinal canal: There is nonspecific soft tissue thickening around the right carotid bifurcation and proximal internal carotid artery. This may be posttraumatic in nature. Consider further evaluation with CTA head and neck angiogram further evaluation. There  is mild prevertebral soft tissue edema. Disc levels:  No evidence of high-grade spinal canal stenosis Upper chest: Biapical pleural-parenchymal scarring. Other: None IMPRESSION: 1. 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 2. Increased retrolisthesis of C4 on C5 compared to a 10/06/22. While this may be positional, further evaluation with a cervical spine MRI is recommended to exclude the possibility of ligamentous injury. 3. Nonspecific soft tissue thickening around the right carotid bifurcation and proximal internal carotid artery. This may be posttraumatic in nature. Consider further evaluation with CTA neck to exclude the possibility of vascular injury. 4. Soft tissue swelling in the periorbital soft tissues on the right. No facial bone fracture. Discussed with Dr. Janee Morn on 04/15/2023 at 12:24 PM. Electronically Signed   By: Lorenza Cambridge M.D.   On: 04/24/2023 12:24   CT MAXILLOFACIAL WO CONTRAST Result Date: 04/17/2023 CLINICAL DATA:  Head trauma, moderate-severe; Polytrauma, blunt; Facial trauma, blunt EXAM: CT HEAD WITHOUT CONTRAST CT MAXILLOFACIAL WITHOUT CONTRAST CT CERVICAL SPINE WITHOUT CONTRAST TECHNIQUE: Multidetector CT imaging of the head, cervical spine, and maxillofacial structures were performed using the standard protocol without intravenous contrast. Multiplanar CT image reconstructions of the cervical spine and maxillofacial structures were also generated. RADIATION DOSE REDUCTION: This exam was performed according to the departmental dose-optimization program which includes automated  exposure control, adjustment of the mA and/or kV according to patient size and/or use of iterative reconstruction technique. 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 No CT evidence of an acute cortical infarct. No mass lesion. No extra-axial fluid collection Vascular: No hyperdense vessel or unexpected calcification. Skull: Normal. Negative for fracture or focal lesion. Other: None. CT MAXILLOFACIAL FINDINGS Osseous: No fracture or mandibular dislocation. No destructive process. Orbits: Negative. No traumatic or inflammatory finding. Sinuses: Clear. Soft tissues: Soft tissue swelling in the periorbital soft tissues on the right. No extra-axial fluid collection. No CT evidence of an acute cortical infarct. No mass lesion. CT CERVICAL SPINE FINDINGS Alignment: There is increased retrolisthesis of C4 on C5 compared to a 10/06/22. While this may be positional, further evaluation with a cervical spine MRI is recommended to exclude the possibility of ligamentous injury. Skull base and vertebrae: No acute fracture. No primary bone lesion or focal pathologic process. Soft tissues and spinal canal: There is nonspecific soft tissue thickening around the right carotid bifurcation and proximal internal carotid artery. This may be posttraumatic in nature. Consider further evaluation with CTA head and neck angiogram further evaluation. There is mild prevertebral soft tissue edema. Disc levels:  No evidence of high-grade spinal canal stenosis Upper chest: Biapical pleural-parenchymal scarring. Other: None IMPRESSION: 1. 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 2. Increased retrolisthesis of C4 on C5 compared to a 10/06/22. While this may be  positional, further evaluation with a cervical spine MRI is recommended to exclude the possibility of ligamentous injury. 3. Nonspecific soft tissue thickening around the right carotid bifurcation and proximal internal carotid artery. This may be posttraumatic in nature. Consider further evaluation with CTA neck to exclude the possibility of vascular injury. 4. Soft tissue swelling in the periorbital soft tissues on the right. No facial bone fracture. Discussed with  Dr. Janee Morn on 04/18/2023 at 12:24 PM. Electronically Signed   By: Lorenza Cambridge M.D.   On: 05/02/2023 12:24   CT CERVICAL SPINE WO CONTRAST Result Date: 04/22/2023 CLINICAL DATA:  Head trauma, moderate-severe; Polytrauma, blunt; Facial trauma, blunt EXAM: CT HEAD WITHOUT CONTRAST CT MAXILLOFACIAL WITHOUT CONTRAST CT CERVICAL SPINE WITHOUT CONTRAST TECHNIQUE: Multidetector CT imaging of the head, cervical spine, and maxillofacial structures were performed using the standard protocol without intravenous contrast. Multiplanar CT image reconstructions of the cervical spine and maxillofacial structures were also generated. RADIATION DOSE REDUCTION: This exam was performed according to the departmental dose-optimization program which includes automated exposure control, adjustment of the mA and/or kV according to patient size and/or use of iterative reconstruction technique. 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 No CT evidence of an acute cortical infarct. No mass lesion. No extra-axial fluid collection Vascular: No hyperdense vessel or unexpected calcification. Skull: Normal. Negative for fracture or focal lesion. Other: None. CT MAXILLOFACIAL FINDINGS Osseous: No fracture or mandibular dislocation. No destructive process. Orbits: Negative. No traumatic or inflammatory finding.  Sinuses: Clear. Soft tissues: Soft tissue swelling in the periorbital soft tissues on the right. No extra-axial fluid collection. No CT evidence of an acute cortical infarct. No mass lesion. CT CERVICAL SPINE FINDINGS Alignment: There is increased retrolisthesis of C4 on C5 compared to a 10/06/22. While this may be positional, further evaluation with a cervical spine MRI is recommended to exclude the possibility of ligamentous injury. Skull base and vertebrae: No acute fracture. No primary bone lesion or focal pathologic process. Soft tissues and spinal canal: There is nonspecific soft tissue thickening around the right carotid bifurcation and proximal internal carotid artery. This may be posttraumatic in nature. Consider further evaluation with CTA head and neck angiogram further evaluation. There is mild prevertebral soft tissue edema. Disc levels:  No evidence of high-grade spinal canal stenosis Upper chest: Biapical pleural-parenchymal scarring. Other: None IMPRESSION: 1. 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 2. Increased retrolisthesis of C4 on C5 compared to a 10/06/22. While this may be positional, further evaluation with a cervical spine MRI is recommended to exclude the possibility of ligamentous injury. 3. Nonspecific soft tissue thickening around the right carotid bifurcation and proximal internal carotid artery. This may be posttraumatic in nature. Consider further evaluation with CTA neck to exclude the possibility of vascular injury. 4. Soft tissue swelling in the periorbital soft tissues on the right. No facial bone fracture. Discussed with Dr. Janee Morn on 04/24/2023 at 12:24 PM. Electronically Signed   By: Lorenza Cambridge M.D.   On: 05/03/2023 12:24   CUP PACEART REMOTE DEVICE CHECK Result Date: 04/16/2023 ILR summary report received. Battery status OK. Normal device function. No new  symptom, tachy, brady, or pause episodes. No new AF episodes. Monthly summary reports and ROV/PRN [place], CVRS   Microbiology No results found for this or any previous visit (from the past 240 hours).  Lab Basic Metabolic Panel: Recent Labs  Lab 04/22/2023 1148 04/20/2023 1153 04/09/2023 1204  NA 135 136 136  K 4.3 4.4 4.5  CL 100 99  --   CO2 25  --   --   GLUCOSE 176* 175*  --   BUN 18 25*  --  CREATININE 0.90 0.80  --   CALCIUM 8.8*  --   --    Liver Function Tests: Recent Labs  Lab 04/15/2023 1148  AST 41  ALT 17  ALKPHOS 52  BILITOT 0.4  PROT 7.0  ALBUMIN 3.8   No results for input(s): "LIPASE", "AMYLASE" in the last 168 hours. No results for input(s): "AMMONIA" in the last 168 hours. CBC: Recent Labs  Lab 04/17/2023 1148 04/21/2023 1153 05/01/2023 1204  WBC 14.7*  --   --   HGB 13.4 14.6 14.6  HCT 42.0 43.0 43.0  MCV 98.1  --   --   PLT 292  --   --    Cardiac Enzymes: No results for input(s): "CKTOTAL", "CKMB", "CKMBINDEX", "TROPONINI" in the last 168 hours. Sepsis Labs: Recent Labs  Lab 04/14/2023 1148 04/08/2023 1153  WBC 14.7*  --   LATICACIDVEN  --  2.4*    Procedures/Operations     Keondre Markson 05/04/2023, 5:30 PM

## 2023-05-05 DEATH — deceased

## 2023-05-16 ENCOUNTER — Ambulatory Visit: Payer: Medicare Other | Admitting: Neurology

## 2023-05-21 NOTE — Progress Notes (Signed)
 [facility] Summary Report / Loop Recorder

## 2023-07-31 ENCOUNTER — Other Ambulatory Visit: Payer: Medicare Other

## 2023-08-03 ENCOUNTER — Ambulatory Visit: Payer: Medicare Other | Admitting: Nurse Practitioner
